# Patient Record
Sex: Male | Born: 1935 | Race: White | Hispanic: No | Marital: Married
Health system: Midwestern US, Community
[De-identification: ages and names within clinical notes are randomized; demographics above are authoritative.]

## PROBLEM LIST (undated history)

## (undated) DIAGNOSIS — A414 Sepsis due to anaerobes: Secondary | ICD-10-CM

## (undated) DIAGNOSIS — D539 Nutritional anemia, unspecified: Secondary | ICD-10-CM

## (undated) DIAGNOSIS — K625 Hemorrhage of anus and rectum: Secondary | ICD-10-CM

## (undated) DIAGNOSIS — Z8719 Personal history of other diseases of the digestive system: Secondary | ICD-10-CM

## (undated) DIAGNOSIS — R1310 Dysphagia, unspecified: Secondary | ICD-10-CM

## (undated) DIAGNOSIS — E785 Hyperlipidemia, unspecified: Secondary | ICD-10-CM

## (undated) DIAGNOSIS — R32 Unspecified urinary incontinence: Secondary | ICD-10-CM

## (undated) DIAGNOSIS — R160 Hepatomegaly, not elsewhere classified: Secondary | ICD-10-CM

## (undated) DIAGNOSIS — K7581 Nonalcoholic steatohepatitis (NASH): Secondary | ICD-10-CM

## (undated) DIAGNOSIS — N401 Enlarged prostate with lower urinary tract symptoms: Secondary | ICD-10-CM

## (undated) DIAGNOSIS — C221 Intrahepatic bile duct carcinoma: Secondary | ICD-10-CM

## (undated) DIAGNOSIS — Z86010 Personal history of colonic polyps: Secondary | ICD-10-CM

## (undated) DIAGNOSIS — I2510 Atherosclerotic heart disease of native coronary artery without angina pectoris: Secondary | ICD-10-CM

## (undated) DIAGNOSIS — N5201 Erectile dysfunction due to arterial insufficiency: Secondary | ICD-10-CM

## (undated) DIAGNOSIS — F0390 Unspecified dementia without behavioral disturbance: Secondary | ICD-10-CM

## (undated) DIAGNOSIS — I10 Essential (primary) hypertension: Secondary | ICD-10-CM

## (undated) DIAGNOSIS — R5383 Other fatigue: Secondary | ICD-10-CM

## (undated) DIAGNOSIS — M19011 Primary osteoarthritis, right shoulder: Secondary | ICD-10-CM

## (undated) DIAGNOSIS — E291 Testicular hypofunction: Secondary | ICD-10-CM

## (undated) DIAGNOSIS — Z951 Presence of aortocoronary bypass graft: Secondary | ICD-10-CM

## (undated) DIAGNOSIS — K5730 Diverticulosis of large intestine without perforation or abscess without bleeding: Secondary | ICD-10-CM

## (undated) DIAGNOSIS — H02423 Myogenic ptosis of bilateral eyelids: Secondary | ICD-10-CM

## (undated) DIAGNOSIS — I77811 Abdominal aortic ectasia: Secondary | ICD-10-CM

## (undated) DIAGNOSIS — K639 Disease of intestine, unspecified: Secondary | ICD-10-CM

## (undated) DIAGNOSIS — N138 Other obstructive and reflux uropathy: Secondary | ICD-10-CM

## (undated) DIAGNOSIS — Z87891 Personal history of nicotine dependence: Secondary | ICD-10-CM

## (undated) DIAGNOSIS — K760 Fatty (change of) liver, not elsewhere classified: Secondary | ICD-10-CM

## (undated) DIAGNOSIS — L719 Rosacea, unspecified: Secondary | ICD-10-CM

## (undated) DIAGNOSIS — Z8711 Personal history of peptic ulcer disease: Secondary | ICD-10-CM

## (undated) DIAGNOSIS — E119 Type 2 diabetes mellitus without complications: Secondary | ICD-10-CM

## (undated) DIAGNOSIS — N400 Enlarged prostate without lower urinary tract symptoms: Secondary | ICD-10-CM

## (undated) DIAGNOSIS — R197 Diarrhea, unspecified: Secondary | ICD-10-CM

## (undated) HISTORY — PX: VASECTOMY: SHX75

## (undated) HISTORY — DX: Presence of aortocoronary bypass graft: Z95.1

## (undated) HISTORY — DX: Unspecified dementia, unspecified severity, without behavioral disturbance, psychotic disturbance, mood disturbance, and anxiety: F03.90

## (undated) HISTORY — DX: Hemorrhage of anus and rectum: K62.5

## (undated) HISTORY — DX: Nonalcoholic steatohepatitis (NASH): K75.81

## (undated) HISTORY — DX: Unspecified urinary incontinence: R32

## (undated) HISTORY — DX: Primary osteoarthritis, right shoulder: M19.011

## (undated) HISTORY — PX: COLONOSCOPY: SHX174

## (undated) HISTORY — DX: Essential (primary) hypertension: I10

## (undated) HISTORY — DX: Dysphagia, unspecified: R13.10

## (undated) HISTORY — DX: Fatty (change of) liver, not elsewhere classified: K76.0

## (undated) HISTORY — DX: Other fatigue: R53.83

## (undated) HISTORY — PX: CATARACT EXTRACTION, BILATERAL: SHX1313

## (undated) HISTORY — DX: Hyperlipidemia, unspecified: E78.5

## (undated) HISTORY — DX: Other obstructive and reflux uropathy: N13.8

## (undated) HISTORY — DX: Diverticulosis of large intestine without perforation or abscess without bleeding: K57.30

## (undated) HISTORY — DX: Type 2 diabetes mellitus without complications: E11.9

## (undated) HISTORY — DX: Personal history of other diseases of the digestive system: Z87.19

## (undated) HISTORY — DX: Testicular hypofunction: E29.1

## (undated) HISTORY — DX: Nutritional anemia, unspecified: D53.9

## (undated) HISTORY — DX: Disease of intestine, unspecified: K63.9

## (undated) HISTORY — PX: OTHER SURGICAL HISTORY: SHX169

## (undated) HISTORY — PX: PTCA: SHX146

## (undated) HISTORY — DX: Personal history of nicotine dependence: Z87.891

## (undated) HISTORY — DX: Atherosclerotic heart disease of native coronary artery without angina pectoris: I25.10

## (undated) HISTORY — DX: Sepsis due to anaerobes: A41.4

## (undated) HISTORY — DX: Myogenic ptosis of bilateral eyelids: H02.423

## (undated) HISTORY — PX: TONSILLECTOMY: SUR1361

## (undated) HISTORY — PX: UMBILICAL HERNIA REPAIR: SHX196

## (undated) HISTORY — DX: Personal history of colonic polyps: Z86.010

## (undated) HISTORY — DX: Rosacea, unspecified: L71.9

## (undated) HISTORY — DX: Abdominal aortic ectasia: I77.811

## (undated) HISTORY — DX: Benign prostatic hyperplasia with lower urinary tract symptoms: N40.1

## (undated) HISTORY — DX: Personal history of peptic ulcer disease: Z87.11

## (undated) HISTORY — DX: Hepatomegaly, not elsewhere classified: R16.0

## (undated) HISTORY — DX: Erectile dysfunction due to arterial insufficiency: N52.01

---
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
## 1998-12-21 ENCOUNTER — Other Ambulatory Visit: Admission: RE | Admit: 1998-12-21 | Discharge: 1998-12-21 | Payer: Self-pay | Admitting: Internal Medicine

## 2000-11-19 DIAGNOSIS — Z86010 Personal history of colonic polyps: Secondary | ICD-10-CM

## 2000-11-19 DIAGNOSIS — Z860101 Personal history of adenomatous and serrated colon polyps: Secondary | ICD-10-CM

## 2000-11-19 HISTORY — DX: Personal history of adenomatous and serrated colon polyps: Z86.0101

## 2000-11-19 HISTORY — DX: Personal history of colonic polyps: Z86.010

## 2001-11-04 ENCOUNTER — Encounter (INDEPENDENT_AMBULATORY_CARE_PROVIDER_SITE_OTHER): Payer: Self-pay | Admitting: Gastroenterology

## 2004-11-02 ENCOUNTER — Ambulatory Visit: Payer: Self-pay | Admitting: Internal Medicine

## 2004-11-17 ENCOUNTER — Ambulatory Visit: Payer: Self-pay

## 2005-01-03 ENCOUNTER — Ambulatory Visit: Payer: Self-pay | Admitting: Internal Medicine

## 2005-01-04 ENCOUNTER — Ambulatory Visit: Payer: Self-pay | Admitting: Gastroenterology

## 2005-01-16 ENCOUNTER — Ambulatory Visit: Payer: Self-pay | Admitting: Gastroenterology

## 2005-01-25 ENCOUNTER — Ambulatory Visit: Payer: Self-pay | Admitting: Internal Medicine

## 2005-04-17 ENCOUNTER — Ambulatory Visit: Payer: Self-pay | Admitting: Internal Medicine

## 2005-04-19 ENCOUNTER — Ambulatory Visit: Payer: Self-pay | Admitting: Internal Medicine

## 2005-04-26 ENCOUNTER — Ambulatory Visit: Payer: Self-pay | Admitting: Internal Medicine

## 2005-05-18 ENCOUNTER — Ambulatory Visit: Payer: Self-pay | Admitting: Internal Medicine

## 2005-06-18 ENCOUNTER — Ambulatory Visit: Payer: Self-pay | Admitting: Internal Medicine

## 2005-06-26 ENCOUNTER — Ambulatory Visit: Payer: Self-pay | Admitting: Internal Medicine

## 2005-07-16 ENCOUNTER — Ambulatory Visit: Payer: Self-pay | Admitting: Internal Medicine

## 2005-08-16 ENCOUNTER — Ambulatory Visit: Payer: Self-pay | Admitting: Internal Medicine

## 2005-09-04 ENCOUNTER — Ambulatory Visit: Payer: Self-pay | Admitting: Internal Medicine

## 2005-09-17 ENCOUNTER — Ambulatory Visit: Payer: Self-pay | Admitting: Internal Medicine

## 2005-10-18 ENCOUNTER — Ambulatory Visit: Payer: Self-pay | Admitting: Endocrinology

## 2005-10-23 ENCOUNTER — Ambulatory Visit: Payer: Self-pay | Admitting: Internal Medicine

## 2005-11-22 ENCOUNTER — Ambulatory Visit: Payer: Self-pay | Admitting: Internal Medicine

## 2005-12-15 ENCOUNTER — Ambulatory Visit: Payer: Self-pay | Admitting: Internal Medicine

## 2005-12-24 ENCOUNTER — Ambulatory Visit: Payer: Self-pay | Admitting: Internal Medicine

## 2006-01-21 ENCOUNTER — Ambulatory Visit: Payer: Self-pay | Admitting: Internal Medicine

## 2006-02-21 ENCOUNTER — Ambulatory Visit: Payer: Self-pay | Admitting: Internal Medicine

## 2006-03-21 ENCOUNTER — Ambulatory Visit: Payer: Self-pay | Admitting: Internal Medicine

## 2006-04-08 ENCOUNTER — Ambulatory Visit: Payer: Self-pay | Admitting: Internal Medicine

## 2006-04-23 ENCOUNTER — Ambulatory Visit: Payer: Self-pay | Admitting: Internal Medicine

## 2006-05-06 ENCOUNTER — Ambulatory Visit: Payer: Self-pay | Admitting: Internal Medicine

## 2006-05-21 ENCOUNTER — Ambulatory Visit: Payer: Self-pay | Admitting: Internal Medicine

## 2006-06-03 ENCOUNTER — Ambulatory Visit: Payer: Self-pay | Admitting: Internal Medicine

## 2006-06-25 ENCOUNTER — Ambulatory Visit: Payer: Self-pay | Admitting: Internal Medicine

## 2006-07-23 ENCOUNTER — Ambulatory Visit: Payer: Self-pay | Admitting: Internal Medicine

## 2006-08-27 ENCOUNTER — Ambulatory Visit: Payer: Self-pay | Admitting: Internal Medicine

## 2006-08-28 ENCOUNTER — Ambulatory Visit: Payer: Self-pay | Admitting: Internal Medicine

## 2006-09-27 ENCOUNTER — Ambulatory Visit: Payer: Self-pay | Admitting: Internal Medicine

## 2006-10-29 ENCOUNTER — Ambulatory Visit: Payer: Self-pay | Admitting: Internal Medicine

## 2006-11-26 ENCOUNTER — Ambulatory Visit: Payer: Self-pay | Admitting: Internal Medicine

## 2006-12-24 ENCOUNTER — Ambulatory Visit: Payer: Self-pay | Admitting: Internal Medicine

## 2007-01-21 ENCOUNTER — Ambulatory Visit: Payer: Self-pay | Admitting: Internal Medicine

## 2007-02-11 ENCOUNTER — Ambulatory Visit: Payer: Self-pay | Admitting: Internal Medicine

## 2007-02-26 ENCOUNTER — Ambulatory Visit (HOSPITAL_COMMUNITY): Admission: RE | Admit: 2007-02-26 | Discharge: 2007-02-26 | Payer: Self-pay | Admitting: Internal Medicine

## 2007-02-26 ENCOUNTER — Ambulatory Visit: Payer: Self-pay | Admitting: Internal Medicine

## 2007-03-05 ENCOUNTER — Ambulatory Visit: Payer: Self-pay | Admitting: Internal Medicine

## 2007-03-18 ENCOUNTER — Ambulatory Visit: Payer: Self-pay | Admitting: Internal Medicine

## 2007-04-16 ENCOUNTER — Encounter: Payer: Self-pay | Admitting: Endocrinology

## 2007-04-16 DIAGNOSIS — M109 Gout, unspecified: Secondary | ICD-10-CM

## 2007-04-16 DIAGNOSIS — E291 Testicular hypofunction: Secondary | ICD-10-CM

## 2007-04-16 DIAGNOSIS — I10 Essential (primary) hypertension: Secondary | ICD-10-CM | POA: Insufficient documentation

## 2007-04-17 ENCOUNTER — Ambulatory Visit: Payer: Self-pay | Admitting: Internal Medicine

## 2007-05-20 ENCOUNTER — Ambulatory Visit: Payer: Self-pay | Admitting: Internal Medicine

## 2007-06-10 ENCOUNTER — Ambulatory Visit: Payer: Self-pay | Admitting: Internal Medicine

## 2007-06-10 LAB — CONVERTED CEMR LAB
TSH: 2.59 microintl units/mL (ref 0.35–5.50)
Testosterone: 303.65 ng/dL — ABNORMAL LOW (ref 350.00–890)
Total CHOL/HDL Ratio: 3.2
VLDL: 28 mg/dL (ref 0–40)

## 2007-06-17 ENCOUNTER — Ambulatory Visit: Payer: Self-pay | Admitting: Internal Medicine

## 2007-07-15 ENCOUNTER — Ambulatory Visit: Payer: Self-pay | Admitting: Internal Medicine

## 2007-08-12 ENCOUNTER — Ambulatory Visit: Payer: Self-pay | Admitting: Internal Medicine

## 2007-08-27 ENCOUNTER — Ambulatory Visit: Payer: Self-pay | Admitting: Internal Medicine

## 2007-09-11 ENCOUNTER — Ambulatory Visit: Payer: Self-pay | Admitting: Internal Medicine

## 2007-09-19 ENCOUNTER — Encounter: Payer: Self-pay | Admitting: Internal Medicine

## 2007-10-21 ENCOUNTER — Ambulatory Visit: Payer: Self-pay | Admitting: Internal Medicine

## 2007-10-28 ENCOUNTER — Ambulatory Visit (HOSPITAL_COMMUNITY): Admission: RE | Admit: 2007-10-28 | Discharge: 2007-10-28 | Payer: Self-pay | Admitting: General Surgery

## 2007-11-25 ENCOUNTER — Ambulatory Visit: Payer: Self-pay | Admitting: Internal Medicine

## 2007-12-25 ENCOUNTER — Encounter: Payer: Self-pay | Admitting: Internal Medicine

## 2007-12-30 ENCOUNTER — Ambulatory Visit: Payer: Self-pay | Admitting: Internal Medicine

## 2008-01-13 ENCOUNTER — Encounter (INDEPENDENT_AMBULATORY_CARE_PROVIDER_SITE_OTHER): Payer: Self-pay | Admitting: *Deleted

## 2008-01-13 ENCOUNTER — Ambulatory Visit: Payer: Self-pay | Admitting: Internal Medicine

## 2008-01-13 LAB — CONVERTED CEMR LAB
CO2: 29 meq/L (ref 19–32)
Chloride: 104 meq/L (ref 96–112)
Creatinine, Ser: 1.1 mg/dL (ref 0.4–1.5)
GFR calc Af Amer: 85 mL/min
Hgb A1c MFr Bld: 6.5 % — ABNORMAL HIGH (ref 4.6–6.0)
Potassium: 4.8 meq/L (ref 3.5–5.1)
Sodium: 140 meq/L (ref 135–145)

## 2008-01-15 ENCOUNTER — Encounter: Payer: Self-pay | Admitting: Internal Medicine

## 2008-01-27 ENCOUNTER — Ambulatory Visit: Payer: Self-pay | Admitting: Internal Medicine

## 2008-02-24 ENCOUNTER — Ambulatory Visit: Payer: Self-pay | Admitting: Internal Medicine

## 2008-03-25 ENCOUNTER — Ambulatory Visit: Payer: Self-pay | Admitting: Internal Medicine

## 2008-04-05 ENCOUNTER — Encounter: Payer: Self-pay | Admitting: Internal Medicine

## 2008-04-07 ENCOUNTER — Encounter: Payer: Self-pay | Admitting: Internal Medicine

## 2008-04-21 ENCOUNTER — Telehealth: Payer: Self-pay | Admitting: Internal Medicine

## 2008-04-22 ENCOUNTER — Ambulatory Visit: Payer: Self-pay | Admitting: Internal Medicine

## 2008-05-13 ENCOUNTER — Encounter: Payer: Self-pay | Admitting: Internal Medicine

## 2008-05-14 ENCOUNTER — Ambulatory Visit: Payer: Self-pay | Admitting: Internal Medicine

## 2008-05-14 ENCOUNTER — Encounter (INDEPENDENT_AMBULATORY_CARE_PROVIDER_SITE_OTHER): Payer: Self-pay | Admitting: *Deleted

## 2008-05-25 ENCOUNTER — Telehealth: Payer: Self-pay | Admitting: Internal Medicine

## 2008-05-27 ENCOUNTER — Ambulatory Visit: Payer: Self-pay | Admitting: Internal Medicine

## 2008-06-14 ENCOUNTER — Encounter: Payer: Self-pay | Admitting: Internal Medicine

## 2008-06-23 ENCOUNTER — Ambulatory Visit: Payer: Self-pay | Admitting: Internal Medicine

## 2008-06-23 DIAGNOSIS — E7800 Pure hypercholesterolemia, unspecified: Secondary | ICD-10-CM | POA: Insufficient documentation

## 2008-06-23 DIAGNOSIS — E785 Hyperlipidemia, unspecified: Secondary | ICD-10-CM

## 2008-06-23 LAB — CONVERTED CEMR LAB
ALT: 27 units/L (ref 0–53)
Albumin: 4.3 g/dL (ref 3.5–5.2)
Calcium: 9.4 mg/dL (ref 8.4–10.5)
Chloride: 103 meq/L (ref 96–112)
Cholesterol: 143 mg/dL (ref 0–200)
Creatinine, Ser: 1.1 mg/dL (ref 0.4–1.5)
Direct LDL: 85.1 mg/dL
GFR calc non Af Amer: 70 mL/min
HDL: 33.1 mg/dL — ABNORMAL LOW (ref >39.0)
Potassium: 4.2 meq/L (ref 3.5–5.1)
Total CHOL/HDL Ratio: 4.3
Total Protein: 7.1 g/dL (ref 6.0–8.3)

## 2008-06-27 ENCOUNTER — Encounter: Payer: Self-pay | Admitting: Internal Medicine

## 2008-07-22 ENCOUNTER — Ambulatory Visit: Payer: Self-pay | Admitting: Internal Medicine

## 2008-08-03 ENCOUNTER — Encounter: Payer: Self-pay | Admitting: Internal Medicine

## 2008-08-17 ENCOUNTER — Ambulatory Visit: Payer: Self-pay | Admitting: Internal Medicine

## 2008-08-24 ENCOUNTER — Telehealth: Payer: Self-pay | Admitting: Internal Medicine

## 2008-08-24 DIAGNOSIS — E119 Type 2 diabetes mellitus without complications: Secondary | ICD-10-CM

## 2008-09-21 ENCOUNTER — Ambulatory Visit: Payer: Self-pay | Admitting: Internal Medicine

## 2008-10-18 ENCOUNTER — Telehealth: Payer: Self-pay | Admitting: Internal Medicine

## 2008-10-19 ENCOUNTER — Ambulatory Visit: Payer: Self-pay | Admitting: Internal Medicine

## 2008-11-23 ENCOUNTER — Ambulatory Visit: Payer: Self-pay | Admitting: Internal Medicine

## 2008-12-28 ENCOUNTER — Telehealth: Payer: Self-pay | Admitting: Internal Medicine

## 2008-12-28 ENCOUNTER — Ambulatory Visit: Payer: Self-pay | Admitting: Internal Medicine

## 2009-01-12 ENCOUNTER — Encounter: Payer: Self-pay | Admitting: Internal Medicine

## 2009-01-25 ENCOUNTER — Ambulatory Visit: Payer: Self-pay | Admitting: Internal Medicine

## 2009-02-01 ENCOUNTER — Ambulatory Visit: Payer: Self-pay | Admitting: Internal Medicine

## 2009-02-02 ENCOUNTER — Ambulatory Visit: Payer: Self-pay | Admitting: Internal Medicine

## 2009-02-02 LAB — CONVERTED CEMR LAB
Albumin: 4 g/dL (ref 3.5–5.2)
Bilirubin, Direct: 0.2 mg/dL (ref 0.0–0.3)
Chloride: 106 meq/L (ref 96–112)
Cholesterol: 103 mg/dL (ref 0–200)
Glucose, Bld: 121 mg/dL — ABNORMAL HIGH (ref 70–99)
HDL: 29.9 mg/dL — ABNORMAL LOW (ref >39.00)
Potassium: 4.8 meq/L (ref 3.5–5.1)
Sodium: 140 meq/L (ref 135–145)
Total Protein: 6.4 g/dL (ref 6.0–8.3)
Triglycerides: 87 mg/dL (ref 0.0–149.0)
VLDL: 17.4 mg/dL (ref 0.0–40.0)

## 2009-02-21 ENCOUNTER — Telehealth: Payer: Self-pay | Admitting: Gastroenterology

## 2009-02-22 ENCOUNTER — Ambulatory Visit: Payer: Self-pay | Admitting: Gastroenterology

## 2009-02-22 DIAGNOSIS — Z860100 Personal history of colon polyps, unspecified: Secondary | ICD-10-CM | POA: Insufficient documentation

## 2009-02-22 DIAGNOSIS — K5730 Diverticulosis of large intestine without perforation or abscess without bleeding: Secondary | ICD-10-CM

## 2009-02-22 DIAGNOSIS — K625 Hemorrhage of anus and rectum: Secondary | ICD-10-CM

## 2009-02-23 ENCOUNTER — Ambulatory Visit: Payer: Self-pay | Admitting: Internal Medicine

## 2009-02-24 ENCOUNTER — Telehealth: Payer: Self-pay | Admitting: Gastroenterology

## 2009-02-25 ENCOUNTER — Ambulatory Visit: Payer: Self-pay | Admitting: Gastroenterology

## 2009-03-04 ENCOUNTER — Telehealth: Payer: Self-pay | Admitting: Internal Medicine

## 2009-03-29 ENCOUNTER — Ambulatory Visit: Payer: Self-pay | Admitting: Internal Medicine

## 2009-04-26 ENCOUNTER — Ambulatory Visit: Payer: Self-pay | Admitting: Internal Medicine

## 2009-05-18 ENCOUNTER — Telehealth: Payer: Self-pay | Admitting: Internal Medicine

## 2009-05-18 ENCOUNTER — Telehealth (INDEPENDENT_AMBULATORY_CARE_PROVIDER_SITE_OTHER): Payer: Self-pay | Admitting: *Deleted

## 2009-06-01 ENCOUNTER — Ambulatory Visit: Payer: Self-pay | Admitting: Internal Medicine

## 2009-06-19 HISTORY — PX: CORONARY ARTERY BYPASS GRAFT: SHX141

## 2009-06-30 ENCOUNTER — Ambulatory Visit: Payer: Self-pay | Admitting: Internal Medicine

## 2009-07-12 ENCOUNTER — Inpatient Hospital Stay (HOSPITAL_COMMUNITY): Admission: AD | Admit: 2009-07-12 | Discharge: 2009-07-20 | Payer: Self-pay | Admitting: Internal Medicine

## 2009-07-12 ENCOUNTER — Telehealth: Payer: Self-pay | Admitting: Internal Medicine

## 2009-07-12 ENCOUNTER — Ambulatory Visit: Payer: Self-pay | Admitting: Internal Medicine

## 2009-07-12 ENCOUNTER — Ambulatory Visit: Payer: Self-pay | Admitting: *Deleted

## 2009-07-13 ENCOUNTER — Ambulatory Visit: Payer: Self-pay | Admitting: Thoracic Surgery (Cardiothoracic Vascular Surgery)

## 2009-07-13 ENCOUNTER — Encounter: Payer: Self-pay | Admitting: Cardiovascular Disease

## 2009-07-13 ENCOUNTER — Ambulatory Visit: Payer: Self-pay | Admitting: Internal Medicine

## 2009-07-18 ENCOUNTER — Telehealth: Payer: Self-pay | Admitting: Internal Medicine

## 2009-07-28 ENCOUNTER — Encounter: Payer: Self-pay | Admitting: Cardiovascular Disease

## 2009-08-07 DIAGNOSIS — Z951 Presence of aortocoronary bypass graft: Secondary | ICD-10-CM

## 2009-08-07 DIAGNOSIS — I2510 Atherosclerotic heart disease of native coronary artery without angina pectoris: Secondary | ICD-10-CM

## 2009-08-07 DIAGNOSIS — Z9861 Coronary angioplasty status: Secondary | ICD-10-CM

## 2009-08-07 DIAGNOSIS — Z87891 Personal history of nicotine dependence: Secondary | ICD-10-CM

## 2009-08-07 DIAGNOSIS — R5383 Other fatigue: Secondary | ICD-10-CM

## 2009-08-07 DIAGNOSIS — R5381 Other malaise: Secondary | ICD-10-CM

## 2009-08-08 ENCOUNTER — Ambulatory Visit: Payer: Self-pay | Admitting: Thoracic Surgery (Cardiothoracic Vascular Surgery)

## 2009-08-08 ENCOUNTER — Encounter
Admission: RE | Admit: 2009-08-08 | Discharge: 2009-08-08 | Payer: Self-pay | Admitting: Thoracic Surgery (Cardiothoracic Vascular Surgery)

## 2009-08-08 ENCOUNTER — Encounter: Payer: Self-pay | Admitting: Internal Medicine

## 2009-08-09 ENCOUNTER — Telehealth: Payer: Self-pay | Admitting: Internal Medicine

## 2009-08-09 ENCOUNTER — Ambulatory Visit: Payer: Self-pay | Admitting: Cardiovascular Disease

## 2009-08-09 DIAGNOSIS — E782 Mixed hyperlipidemia: Secondary | ICD-10-CM

## 2009-08-11 ENCOUNTER — Ambulatory Visit: Payer: Self-pay | Admitting: Internal Medicine

## 2009-08-11 ENCOUNTER — Encounter (HOSPITAL_COMMUNITY): Admission: RE | Admit: 2009-08-11 | Discharge: 2009-11-09 | Payer: Self-pay | Admitting: Cardiovascular Disease

## 2009-08-12 ENCOUNTER — Encounter: Payer: Self-pay | Admitting: Cardiovascular Disease

## 2009-08-22 ENCOUNTER — Telehealth: Payer: Self-pay | Admitting: Cardiovascular Disease

## 2009-08-22 ENCOUNTER — Encounter (INDEPENDENT_AMBULATORY_CARE_PROVIDER_SITE_OTHER): Payer: Self-pay | Admitting: *Deleted

## 2009-08-22 ENCOUNTER — Telehealth (INDEPENDENT_AMBULATORY_CARE_PROVIDER_SITE_OTHER): Payer: Self-pay | Admitting: *Deleted

## 2009-08-24 ENCOUNTER — Encounter: Payer: Self-pay | Admitting: Cardiovascular Disease

## 2009-09-01 ENCOUNTER — Encounter: Payer: Self-pay | Admitting: Cardiovascular Disease

## 2009-10-04 ENCOUNTER — Ambulatory Visit: Payer: Self-pay | Admitting: Cardiovascular Disease

## 2009-10-24 ENCOUNTER — Encounter: Payer: Self-pay | Admitting: Internal Medicine

## 2009-11-04 ENCOUNTER — Encounter: Payer: Self-pay | Admitting: Cardiovascular Disease

## 2009-11-10 ENCOUNTER — Encounter (HOSPITAL_COMMUNITY): Admission: RE | Admit: 2009-11-10 | Discharge: 2009-11-18 | Payer: Self-pay | Admitting: Cardiovascular Disease

## 2009-11-28 ENCOUNTER — Ambulatory Visit: Payer: Self-pay | Admitting: Internal Medicine

## 2009-12-02 ENCOUNTER — Ambulatory Visit: Payer: Self-pay | Admitting: Internal Medicine

## 2009-12-22 ENCOUNTER — Encounter: Payer: Self-pay | Admitting: Cardiovascular Disease

## 2010-01-05 ENCOUNTER — Ambulatory Visit: Payer: Self-pay | Admitting: Internal Medicine

## 2010-01-05 LAB — CONVERTED CEMR LAB
ALT: 24 units/L (ref 0–53)
AST: 19 units/L (ref 0–37)
Albumin: 4 g/dL (ref 3.5–5.2)
Alkaline Phosphatase: 79 units/L (ref 39–117)
BUN: 19 mg/dL (ref 6–23)
Bilirubin, Direct: 0.1 mg/dL (ref 0.0–0.3)
CO2: 32 meq/L (ref 19–32)
Calcium: 9.4 mg/dL (ref 8.4–10.5)
Chloride: 107 meq/L (ref 96–112)
Cholesterol: 99 mg/dL (ref 0–200)
Creatinine, Ser: 1 mg/dL (ref 0.4–1.5)
GFR calc non Af Amer: 77.84 mL/min (ref >60)
Glucose, Bld: 81 mg/dL (ref 70–99)
HDL: 47.3 mg/dL (ref >39.00)
Hgb A1c MFr Bld: 6.7 % — ABNORMAL HIGH (ref 4.6–6.5)
LDL Cholesterol: 25 mg/dL (ref 0–99)
Potassium: 4.8 meq/L (ref 3.5–5.1)
Sodium: 143 meq/L (ref 135–145)
Total Bilirubin: 0.6 mg/dL (ref 0.3–1.2)
Total CHOL/HDL Ratio: 2
Total Protein: 7 g/dL (ref 6.0–8.3)
Triglycerides: 132 mg/dL (ref 0.0–149.0)
Uric Acid, Serum: 7.1 mg/dL (ref 4.0–7.8)
VLDL: 26.4 mg/dL (ref 0.0–40.0)

## 2010-01-06 DIAGNOSIS — Z8669 Personal history of other diseases of the nervous system and sense organs: Secondary | ICD-10-CM | POA: Insufficient documentation

## 2010-01-06 DIAGNOSIS — N400 Enlarged prostate without lower urinary tract symptoms: Secondary | ICD-10-CM

## 2010-03-23 ENCOUNTER — Telehealth: Payer: Self-pay | Admitting: Internal Medicine

## 2010-03-29 ENCOUNTER — Ambulatory Visit: Payer: Self-pay | Admitting: Cardiovascular Disease

## 2010-04-03 ENCOUNTER — Encounter: Payer: Self-pay | Admitting: Internal Medicine

## 2010-04-04 ENCOUNTER — Telehealth: Payer: Self-pay | Admitting: Internal Medicine

## 2010-07-26 ENCOUNTER — Ambulatory Visit: Payer: Self-pay | Admitting: Internal Medicine

## 2010-07-26 LAB — CONVERTED CEMR LAB
Chloride: 104 meq/L (ref 96–112)
Hgb A1c MFr Bld: 6.7 % — ABNORMAL HIGH (ref 4.6–6.5)

## 2010-07-27 ENCOUNTER — Encounter: Payer: Self-pay | Admitting: Internal Medicine

## 2010-08-09 ENCOUNTER — Telehealth: Payer: Self-pay | Admitting: Internal Medicine

## 2010-09-26 ENCOUNTER — Ambulatory Visit: Payer: Self-pay | Admitting: Internal Medicine

## 2010-09-26 DIAGNOSIS — J00 Acute nasopharyngitis [common cold]: Secondary | ICD-10-CM | POA: Insufficient documentation

## 2010-12-19 NOTE — Letter (Signed)
   [facility] [place], [place]  [address] Phone: [phone]      July 28, 2010   Ricardo Washington [address] [place], [place] [address]  RE:  LAB RESULTS  Dear  [name],  The following is an interpretation of your most recent lab tests.  Please take note of any instructions provided or changes to medications that have resulted from your lab work.  ELECTROLYTES:  Good - no changes needed  KIDNEY FUNCTION TESTS:  Good - no changes needed    DIABETIC STUDIES:  Good - no changes needed Blood Glucose: 77   HgbA1C: 6.7     Good diabetes control. Keep up the good work.   Sincerely Yours,    Neena Rhymes MD  : Ricardo Washington Note: All result statuses are Final unless otherwise noted.  Tests: (1) Hemoglobin A1C (A1C)   Hemoglobin A1C       [H]  6.7 %                       4.6-6.5     Glycemic Control Guidelines for People with Diabetes:     Non Diabetic:  <6%     Goal of Therapy: <7%     Additional Action Suggested:  >8%   Tests: (2) BMP (METABOL)   Sodium                    141 mEq/L                   135-145   Potassium            [H]  5.2 mEq/L                   3.5-5.1   Chloride                  104 mEq/L                   96-112   Carbon Dioxide            30 mEq/L                    19-32   Glucose                   77 mg/dL                    70-99   BUN                       19 mg/dL                    6-23   Creatinine                0.9 mg/dL                   0.4-1.5   Calcium                   9.4 mg/dL                   8.4-10.5

## 2010-12-19 NOTE — Medication Information (Signed)
Summary: Request for Med Documents/[facility]  Request for Med Documents/[facility]   Imported By: Phillis Knack 04/06/2010 09:17:52  _____________________________________________________________________  External Attachment:    Type:   Image     Comment:   External Document

## 2010-12-19 NOTE — Miscellaneous (Signed)
Summary: [facility]  [facility]   Imported By: Sallee Provencal 01/03/2010 12:37:34  _____________________________________________________________________  External Attachment:    Type:   Image     Comment:   External Document

## 2010-12-19 NOTE — Assessment & Plan Note (Signed)
Summary: cough,cold/cd   Vital Signs:  Patient profile:   76 year old male Height:      66 inches Weight:      204 pounds Temp:     97.6 degrees F oral Pulse rate:   64 / minute Pulse rhythm:   regular Resp:     16 per minute BP sitting:   110 / 60  (left arm) Cuff size:   regular  Vitals Entered By: Jonathon Resides, CMA(AAMA) (September 26, 2010 10:40 AM) CC: stuffy nose, sore throat and dry cough X 4 days Is Patient Diabetic? Yes   Primary Care Provider:  Evert Kohl  CC:  stuffy nose and sore throat and dry cough X 4 days.  History of Present Illness: Mr. Bedrosian presents today with a 3-5 days history of nasal congestion, sore throat, and hacking cough.  He noticed this all came on about 3-5 days ago and has progressivly become worse since than.  He does have some mucous production which is white in color.  His cough is more of a dry cough with just occasional white/clear sputum production.  He also complains of sinus pressure in his frontal and maxillary sinuses but no headache anywhere else.  He denies any fullness of the ears, fevers, or night sweats.  He does complain that he has been cold, but is always cold, has been more tired lately, and has felt weaker than normal.  He has been taking Robitussin and pseudoephedrine but they have not really been helpful.  He did have a relative that visited him recently who was sick with something.  He is up-to-date with both his flu shot and his pneumonia vaccination.   Current Medications (verified): 1)  Crestor 10 Mg Tabs (Rosuvastatin Calcium) .... Take One Tablet By Mouth Daily. 2)  Lisinopril 5 Mg Tabs (Lisinopril) .... Take One Tablet By Mouth Daily 3)  Metformin Hcl 500 Mg Tb24 (Metformin Hcl) .... Take 1 Tablet By Mouth Two Times A Day 4)  Onetouch Ultrasoft Lancets  Misc (Lancets) .... Test Once Daily As Needed Dx:250.00 5)  Flomax 0.4 Mg  Cp24 (Tamsulosin Hcl) .... Take 1 Tablet By Mouth Once A Day 6)  Multivitamins   Tabs  (Multiple Vitamin) .... Take 1 Tablet By Mouth Once A Day 7)  Aspirin Ec 325 Mg Tbec (Aspirin) .... Take One Tablet By Mouth Daily 8)  Metamucil Smooth Texture 28.3 %  Powd (Psyllium) .... 2 Tablespoons Two Times A Day 9)  Alprazolam 0.25 Mg  Tabs (Alprazolam) .... As Directed As Needed 10)  L-Lysine Hcl 500 Mg  Tabs (Lysine Hcl) .... Take 2 Tablet By Mouth Once A Day 11)  Trueresult Blood Glucose W/device Kit (Blood Glucose Monitoring Suppl) .... Check Blood Sugar Every Day Dx 250.00 12)  Metoprolol Tartrate 25 Mg Tabs (Metoprolol Tartrate) .... Take One Tablet By Mouth Twice A Day 13)  Hydrocodone-Acetaminophen 5-325 Mg Tabs (Hydrocodone-Acetaminophen) .... As Needed 14)  Finasteride 5 Mg Tabs (Finasteride) .... Take 1 By Mouth Once Daily 15)  Indomethacin 25 Mg Caps (Indomethacin) .Marland Kitchen.. 1 By Mouth Three Times A Day As Needed Gout Flare 16)  Oxycodone Hcl 5 Mg Tabs (Oxycodone Hcl) .... As Needed 17)  Test Strips For Glucometer .... Dx250.00 Test Two Times A Day  Allergies (verified): 1)  ! Sulfa  Past History:  Past Medical History: Last updated: 01/05/2010 CATARACT, HX OF (ICD-V12.40) HYPERTROPHY PROSTATE W/O UR OBST & OTH LUTS (ICD-600.00) MIXED HYPERLIPIDEMIA (ICD-272.2) CORONARY ARTERY BYPASS GRAFT, THREE  VESSEL, HX OF (ICD-V45.81) PERCUTANEOUS TRANSLUMINAL CORONARY ANGIOPLASTY, HX OF (ICD-V45.82) * POSTOPERATIVE VOLUME OVERLOAD * POSTOPERATIVE ANEMIA CORONARY ARTERY DISEASE (ICD-414.00) FATIGUE (ICD-780.79) TOBACCO USE, QUIT (ICD-V15.82) DIVERTICULOSIS OF COLON (ICD-562.10)-adenomatous '02 PERSONAL HISTORY OF COLONIC POLYPS (ICD-V12.72) RECTAL BLEEDING (ICD-569.3) OTHER AND UNSPECIFIED HYPERLIPIDEMIA (ICD-272.4) HYPOGONADISM (ICD-257.2) HYPERTENSION (ICD-401.9) GOUT (ICD-274.9) AODM (ICD-250.00)  Past Surgical History: Last updated: 08/07/2009 Tonsillectomy Vasectomy Umbilical Hernia Repain 5053 Bilateral cataract extraction and lens implants Current Problems:    PERCUTANEOUS TRANSLUMINAL CORONARY ANGIOPLASTY, HX OF (ICD-V45.82) 07-13-2009 CORONARY ARTERY BYPASS GRAFT, THREE VESSEL, HX OF (ICD-V45.81) 07-14-2009   Hendrickson Median sternotomy extracorporeal circulation coronary   artery bypass grafting x4 (left internal mammary artery to left anterior   descending, saphenous vein graft to first obtuse marginal, sequential   saphenous vein graft to posterior descending and posterior lateral),   endoscopic vein harvest right thigh  Family History: Last updated: 13-Jun-2008 father-deceased @59 : cancer -gastric mother- deceased @62 : cancer death Neg- colon or prostate cancer; CAD, CVA, DM  Social History: Last updated: 02/22/2009 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  Alcohol Use - no Illicit Drug Use - no  Risk Factors: Caffeine Use: 1 (2008/06/13) Exercise: yes (2008/06/13)  Risk Factors: Smoking Status: quit (02/22/2009) Passive Smoke Exposure: no (06-13-2008)  Review of Systems       The patient complains of anorexia, decreased hearing, prolonged cough, and muscle weakness.  The patient denies fever, weight loss, weight gain, vision loss, hoarseness, chest pain, syncope, dyspnea on exertion, headaches, hemoptysis, abdominal pain, melena, hematochezia, hematuria, incontinence, suspicious skin lesions, difficulty walking, depression, unusual weight change, abnormal bleeding, and enlarged lymph nodes.    Physical Exam  General:  alert, well-developed, and well-nourished.   Head:  normocephalic and atraumatic.   Eyes:  pupils equal, pupils round, and pupils reactive to light.   Ears:  R ear normal, no external deformities, and L Cerumen impaction.   Nose:  no external deformity, no external erythema, no nasal discharge, no mucosal pallor, no mucosal edema, and mucosal erythema.    Tenderness to palpation in the frontal and maxillary sinus region. Mouth:  good dentition, pharynx pink and moist, no exudates, and pharyngeal erythema.   Neck:  supple, full ROM, no masses, no cervical lymphadenopathy, and no neck tenderness.   Lungs:  normal respiratory effort, normal breath sounds, no dullness, no crackles, and no wheezes.   Heart:  normal rate, regular rhythm, no murmur, no gallop, no rub, and physiological split S2.   Abdomen:  soft, non-tender, normal bowel sounds, and no distention.   Pulses:  R radial normal, R posterior tibial normal, L radial normal, and L posterior tibial normal.   Neurologic:  alert & oriented X3, cranial nerves II-XII intact, and gait normal.   Skin:  turgor normal and color normal.   Cervical Nodes:  no anterior cervical adenopathy and no posterior cervical adenopathy.   Psych:  Oriented X3, memory intact for recent and remote, normally interactive, good eye contact, not anxious appearing, and not depressed appearing.     Impression & Recommendations:  Problem # 1:  ACUTE NASOPHARYNGITIS (ICD-460) Mr. Brallier does not have any signs of bacterial infection (afebrile, little to no sputum, normal lung sounds) and does not currently need an antibiotic.  This is most likely due to either a cold or allergies due to the current time of  the year and changes in climate.    Plan:  D/c pseudoephedrine use           Start nasal spray for sinus congestion Raul Del)           Stove top vaporizor treatment for sinus congestion           Increase fluid intake           Call if having feavers or increased green/yellow sputum production  Problem # 2:  COUGH (ICD-786.2) Mr. Loiseau cough is probably due to the postnasal drip from his cold which is in turn causing him to have a sore throat.  The plan for his first problem should also help decrease his cough as well.    Plan:  Continue using Robitussin DM for cough and to help with  decongestion.  Complete Medication List: 1)  Crestor 10 Mg Tabs (Rosuvastatin calcium) .... Take one tablet by mouth daily. 2)  Lisinopril 5 Mg Tabs (Lisinopril) .... Take one tablet by mouth daily 3)  Metformin Hcl 500 Mg Tb24 (Metformin hcl) .... Take 1 tablet by mouth two times a day 4)  Onetouch Ultrasoft Lancets Misc (Lancets) .... Test once daily as needed dx:250.00 5)  Flomax 0.4 Mg Cp24 (Tamsulosin hcl) .... Take 1 tablet by mouth once a day 6)  Multivitamins Tabs (Multiple vitamin) .... Take 1 tablet by mouth once a day 7)  Aspirin Ec 325 Mg Tbec (Aspirin) .... Take one tablet by mouth daily 8)  Metamucil Smooth Texture 28.3 % Powd (Psyllium) .... 2 tablespoons two times a day 9)  Alprazolam 0.25 Mg Tabs (Alprazolam) .... As directed as needed 10)  L-lysine Hcl 500 Mg Tabs (Lysine hcl) .... Take 2 tablet by mouth once a day 11)  Trueresult Blood Glucose W/device Kit (Blood glucose monitoring suppl) .... Check blood sugar every day dx 250.00 12)  Metoprolol Tartrate 25 Mg Tabs (Metoprolol tartrate) .... Take one tablet by mouth twice a day 13)  Hydrocodone-acetaminophen 5-325 Mg Tabs (Hydrocodone-acetaminophen) .... As needed 14)  Finasteride 5 Mg Tabs (Finasteride) .... Take 1 by mouth once daily 15)  Indomethacin 25 Mg Caps (Indomethacin) .Marland Kitchen.. 1 by mouth three times a day as needed gout flare 16)  Oxycodone Hcl 5 Mg Tabs (Oxycodone hcl) .... As needed 17)  Test Strips For Glucometer  .... Dx250.00 test two times a day   Orders Added: 1)  Est. Patient Level III [47425]

## 2010-12-19 NOTE — Letter (Signed)
Summary: [facility]   Imported By: Marilynne Drivers 12/07/2009 13:17:40  _____________________________________________________________________  External Attachment:    Type:   Image     Comment:   External Document

## 2010-12-19 NOTE — Progress Notes (Signed)
       New/Updated Medications: * TEST STRIPS FOR GLUCOMETER dx250.00 test two times a day Prescriptions: TEST STRIPS FOR GLUCOMETER dx250.00 test two times a day  #3 mth x 3   Entered by:   Charlsie Quest, CMA   Authorized by:   Neena Rhymes MD   Signed by:   Charlsie Quest, CMA on 04/04/2010   Method used:   [facility] ...       [facility]  [address] [phone]       [place], [place]  [address]       Ph: [phone] or [phone]       Fax: [phone]   RxID:   [id number]

## 2010-12-19 NOTE — Assessment & Plan Note (Signed)
Summary: FU---PER PT---STC   Vital Signs:  Patient profile:   76 year old male Height:      66 inches Weight:      202 pounds BMI:     32.72 O2 Sat:      95 % on Room air Temp:     96.8 degrees F oral Pulse rate:   66 / minute BP sitting:   106 / 64  (left arm) Cuff size:   regular  Vitals Entered By: Charlynne Cousins CMA (July 26, 2010 10:31 AM)  O2 Flow:  Room air CC: follow-up visit/ ab   Primary Care Provider:  Evert Kohl  CC:  follow-up visit/ ab.  History of Present Illness: Patient presents for follow-up of hypertension and known CAD. He was last seen in Feb - note reviewed. He saw Dr. Johnsie Cancel in May '11 and there report is good with no changes in his regimen. He reports taht his CBGs have been in controlled range. He does complain of decreased strength: he has been exercising, using dumbells. He does want to do more activity, he plan to resume doing his own yard work.   Current Medications (verified): 1)  Crestor 10 Mg Tabs (Rosuvastatin Calcium) .... Take One Tablet By Mouth Daily. 2)  Lisinopril 5 Mg Tabs (Lisinopril) .... Take One Tablet By Mouth Daily 3)  Metformin Hcl 500 Mg Tb24 (Metformin Hcl) .... Take 1 Tablet By Mouth Two Times A Day 4)  Onetouch Ultrasoft Lancets  Misc (Lancets) .... Test Once Daily As Needed Dx:250.00 5)  Flomax 0.4 Mg  Cp24 (Tamsulosin Hcl) .... Take 1 Tablet By Mouth Once A Day 6)  Multivitamins   Tabs (Multiple Vitamin) .... Take 1 Tablet By Mouth Once A Day 7)  Aspirin Ec 325 Mg Tbec (Aspirin) .... Take One Tablet By Mouth Daily 8)  Metamucil Smooth Texture 28.3 %  Powd (Psyllium) .... 2 Tablespoons Two Times A Day 9)  Alprazolam 0.25 Mg  Tabs (Alprazolam) .... As Directed As Needed 10)  L-Lysine Hcl 500 Mg  Tabs (Lysine Hcl) .... Take 2 Tablet By Mouth Once A Day 11)  Trueresult Blood Glucose W/device Kit (Blood Glucose Monitoring Suppl) .... Check Blood Sugar Every Day Dx 250.00 12)  Metoprolol Tartrate 25 Mg Tabs (Metoprolol  Tartrate) .... Take One Tablet By Mouth Twice A Day 13)  Hydrocodone-Acetaminophen 5-325 Mg Tabs (Hydrocodone-Acetaminophen) .... As Needed 14)  Finasteride 5 Mg Tabs (Finasteride) .... Take 1 By Mouth Once Daily 15)  Indomethacin 25 Mg Caps (Indomethacin) .Marland Kitchen.. 1 By Mouth Three Times A Day As Needed Gout Flare 16)  Oxycodone Hcl 5 Mg Tabs (Oxycodone Hcl) .... As Needed 17)  Test Strips For Glucometer .... Dx250.00 Test Two Times A Day  Allergies (verified): 1)  ! 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 '02 PERSONAL HISTORY OF COLONIC POLYPS (ICD-V12.72) RECTAL BLEEDING (ICD-569.3) OTHER AND UNSPECIFIED HYPERLIPIDEMIA (ICD-272.4) HYPOGONADISM (ICD-257.2) HYPERTENSION (ICD-401.9) GOUT (ICD-274.9) AODM (ICD-250.00)  Past Surgical History: Last updated: 08/07/2009 Tonsillectomy Vasectomy Umbilical Hernia Repain 4098 Bilateral cataract extraction and lens implants Current Problems:  PERCUTANEOUS TRANSLUMINAL CORONARY ANGIOPLASTY, HX OF (ICD-V45.82) 07-13-2009 CORONARY ARTERY BYPASS GRAFT, THREE VESSEL, HX  OF (ICD-V45.81) 07-14-2009   Hendrickson Median sternotomy extracorporeal circulation coronary   artery bypass grafting x4 (left internal mammary artery to left anterior   descending, saphenous vein graft to first obtuse marginal, sequential   saphenous vein graft to posterior descending and posterior lateral),   endoscopic vein harvest right thigh  Family History: Last updated: 06/10/2008 father-deceased @59 :  cancer -gastric mother- deceased @62 : cancer death Neg- colon or prostate cancer; CAD, CVA, DM  Social History: Last updated: 02/22/2009 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  Alcohol Use - no Illicit Drug Use - no  Risk Factors: Smoking Status: quit (02/22/2009) Passive Smoke Exposure: no (2008-06-10)  Review of Systems  The patient denies anorexia, fever, weight loss, weight gain, vision loss, hoarseness, chest pain, syncope, prolonged cough, headaches, abdominal pain, hematochezia, hematuria, muscle weakness, suspicious skin lesions, depression, enlarged lymph nodes, and angioedema.    Physical Exam  General:  Overweight white male who looks good and is in no distress Head:  Normocephalic and atraumatic without obvious abnormalities. No apparent alopecia or balding. Eyes:  pupils equal and pupils round.   Chest Wall:  well healed sternotomy scar with mild keloid formation Lungs:  normal respiratory effort and normal breath sounds.   Heart:  normal rate, regular rhythm, and no murmur.   Abdomen:  soft, non-tender, normal bowel sounds, and no guarding.   Msk:  no joint tenderness, no joint swelling, no joint warmth, and no redness over joints.   Pulses:  2+ radial Extremities:  no peripheral edema Neurologic:  alert & oriented X3, cranial nerves II-XII intact, and gait normal.   Skin:  turgor normal and color normal.   Cervical Nodes:  no anterior cervical adenopathy and no posterior cervical adenopathy.   Psych:  Oriented X3, memory intact for recent and remote, normally interactive, and good eye contact.     Impression & Recommendations:  Problem # 1:  MIXED HYPERLIPIDEMIA (ICD-272.2)  His updated medication list for this problem includes:    Crestor 10 Mg Tabs (Rosuvastatin calcium) .Marland Kitchen... Take one tablet by mouth  daily.  Labs Reviewed: SGOT: 19 (01/05/2010)   SGPT: 24 (01/05/2010)   HDL:47.30 (01/05/2010), 29.90 (02/02/2009)  LDL:25 (01/05/2010), 56 (02/02/2009)  Chol:99 (01/05/2010), 103 (02/02/2009)  Trig:132.0 (01/05/2010), 87.0 (02/02/2009)  Excellent control. Will continue crestor  Problem # 2:  CORONARY ARTERY BYPASS GRAFT, THREE VESSEL, HX OF (ICD-V45.81) Stable and recovered. He is current with cardiology. He has resumed all of his normal activities and is working on a Magazine features editor. It is ok to resume playing golf.  His updated medication list for this problem includes:    Lisinopril 5 Mg Tabs (Lisinopril) .Marland Kitchen... Take one tablet by mouth daily    Aspirin Ec 325 Mg Tbec (Aspirin) .Marland Kitchen... Take one tablet by mouth daily    Metoprolol Tartrate 25 Mg Tabs (Metoprolol tartrate) .Marland Kitchen... Take one tablet by mouth twice a day  Problem # 3:  HYPERTENSION (ICD-401.9)  His updated medication list for this problem includes:    Lisinopril 5 Mg Tabs (Lisinopril) .Marland Kitchen... Take one tablet by mouth daily    Metoprolol Tartrate 25 Mg Tabs (Metoprolol tartrate) .Marland Kitchen... Take one tablet by mouth twice a day  BP today: 106/64 Prior BP: 128/79 (03/29/2010)  Labs Reviewed: K+: 4.8 (01/05/2010) Creat: : 1.0 (01/05/2010)   Chol: 99 (01/05/2010)  HDL: 47.30 (01/05/2010)   LDL: 25 (01/05/2010)   TG: 132.0 (01/05/2010)  Excellent control. He has been asymptomatic. Will continue present medications.   Problem # 4:  GOUT (ICD-274.9) No recent flares  Problem # 5:  AODM (ICD-250.00) For A1C with recommendations to follow.  His updated medication list for this problem includes:    Lisinopril 5 Mg Tabs (Lisinopril) .Marland Kitchen... Take one tablet by mouth daily    Metformin Hcl 500 Mg Tb24 (Metformin hcl) .Marland Kitchen... Take 1 tablet by mouth two times a day    Aspirin Ec 325 Mg Tbec (Aspirin) .Marland Kitchen... Take one tablet by mouth daily  Orders: TLB-A1C / Hgb A1C (Glycohemoglobin) (83036-A1C) TLB-BMP (Basic Metabolic Panel-BMET)  ([id number])  Addendum - good control! Will continue present medications and regimen.  : Tyshawn Helms Note: All result statuses are Final unless otherwise noted.  Tests: (1) Hemoglobin A1C (A1C)   Hemoglobin A1C       [H]  6.7 %                       4.6-6.5     Glycemic Control Guidelines for People with Diabetes:     Non Diabetic:  <6%     Goal of Therapy: <7%     Additional Action Suggested:  >8%   Tests: (2) BMP (METABOL)   Sodium                    141 mEq/L                   135-145   Potassium            [H]  5.2 mEq/L                   3.5-5.1   Chloride                  104 mEq/L                   96-112   Carbon Dioxide            30 mEq/L                    19-32   Glucose                   77 mg/dL                    70-99   BUN                       19 mg/dL                    6-23   Creatinine                0.9 mg/dL                   0.4-1.5   Calcium                   9.4 mg/dL                   8.4-10.5  Complete Medication List: 1)  Crestor 10 Mg Tabs (Rosuvastatin calcium) .... Take one tablet by mouth daily. 2)  Lisinopril 5 Mg Tabs (Lisinopril) .... Take one tablet by mouth daily 3)  Metformin Hcl 500 Mg Tb24 (  Metformin hcl) .... Take 1 tablet by mouth two times a day 4)  Onetouch Ultrasoft Lancets Misc (Lancets) .... Test once daily as needed dx:250.00 5)  Flomax 0.4 Mg Cp24 (Tamsulosin hcl) .... Take 1 tablet by mouth once a day 6)  Multivitamins Tabs (Multiple vitamin) .... Take 1 tablet by mouth once a day 7)  Aspirin Ec 325 Mg Tbec (Aspirin) .... Take one tablet by mouth daily 8)  Metamucil Smooth Texture 28.3 % Powd (Psyllium) .... 2 tablespoons two times a day 9)  Alprazolam 0.25 Mg Tabs (Alprazolam) .... As directed as needed 10)  L-lysine Hcl 500 Mg Tabs (Lysine hcl) .... Take 2 tablet by mouth once a day 11)  Trueresult Blood Glucose W/device Kit (Blood glucose monitoring suppl) .... Check blood sugar every day dx 250.00 12)  Metoprolol  Tartrate 25 Mg Tabs (Metoprolol tartrate) .... Take one tablet by mouth twice a day 13)  Hydrocodone-acetaminophen 5-325 Mg Tabs (Hydrocodone-acetaminophen) .... As needed 14)  Finasteride 5 Mg Tabs (Finasteride) .... Take 1 by mouth once daily 15)  Indomethacin 25 Mg Caps (Indomethacin) .Marland Kitchen.. 1 by mouth three times a day as needed gout flare 16)  Oxycodone Hcl 5 Mg Tabs (Oxycodone hcl) .... As needed 17)  Test Strips For Glucometer  .... Dx250.00 test two times a day

## 2010-12-19 NOTE — Progress Notes (Signed)
  Phone Note Refill Request Message from:  Fax from Pharmacy on Mar 23, 2010 9:45 AM  Refills Requested: Medication #1:  FINASTERIDE 5 MG TABS take 1 by mouth once daily Initial call taken by: Ami Bullins CMA,  Mar 23, 2010 9:45 AM    Prescriptions: FINASTERIDE 5 MG TABS (FINASTERIDE) take 1 by mouth once daily  #30 x 6   Entered by:   Ami Bullins CMA   Authorized by:   Neena Rhymes MD   Signed by:   Charlynne Cousins CMA on 03/23/2010   Method used:   [facility]        [facility]  [id number]       [place], [place]  [address]       Ph: [phone] or [phone]       Fax: [phone]   RxID:   [id number]

## 2010-12-19 NOTE — Assessment & Plan Note (Signed)
Summary: cough/cold/cd   Vital Signs:  Patient profile:   76 year old male Height:      66 inches Weight:      196 pounds BMI:     31.75 O2 Sat:      97 % on Room air Temp:     96.5 degrees F oral Pulse rate:   70 / minute BP sitting:   102 / 60  (left arm) Cuff size:   large  Vitals Entered By: Charlynne Cousins CMA (December 02, 2009 9:50 AM)  O2 Flow:  Room air CC: pt here with complaint of ongoing coughing spells and states they are worse when laying down. Pt produces green mucous with cough and states he took  the promethazine codeine cough syrup but says when he took it his throat felt tight/ ab   Primary Care Provider:  Evert Kohl  CC:  pt here with complaint of ongoing coughing spells and states they are worse when laying down. Pt produces green mucous with cough and states he took  the promethazine codeine cough syrup but says when he took it his throat felt tight/ ab.  History of Present Illness: Seen on the 10th for URI with cough. He has taken the last azithromycin today. Explained that he has  bacterialcidal levels of antibiotics in his system for additional five days. He is still coughing and is congested.   Current Medications (verified): 1)  Crestor 10 Mg Tabs (Rosuvastatin Calcium) .... Take One Tablet By Mouth Daily. 2)  Lisinopril 5 Mg Tabs (Lisinopril) .... Take One Tablet By Mouth Daily 3)  Metformin Hcl 500 Mg Tb24 (Metformin Hcl) .... Take 1 Tablet By Mouth Two Times A Day 4)  Onetouch Ultrasoft Lancets  Misc (Lancets) .... Test Once Daily As Needed Dx:250.00 5)  Flomax 0.4 Mg  Cp24 (Tamsulosin Hcl) .... Take 1 Tablet By Mouth Once A Day 6)  Multivitamins   Tabs (Multiple Vitamin) .... Take 1 Tablet By Mouth Once A Day 7)  Aspirin Ec 325 Mg Tbec (Aspirin) .... Take One Tablet By Mouth Daily 8)  Metamucil Smooth Texture 28.3 %  Powd (Psyllium) .... 2 Tablespoons Two Times A Day 9)  Alprazolam 0.25 Mg  Tabs (Alprazolam) .... As Directed As Needed 10)   L-Lysine Hcl 500 Mg  Tabs (Lysine Hcl) .... Take 2 Tablet By Mouth Once A Day 11)  Trueresult Blood Glucose W/device Kit (Blood Glucose Monitoring Suppl) .... Check Blood Sugar Every Day Dx 250.00 12)  Metoprolol Tartrate 25 Mg Tabs (Metoprolol Tartrate) .... Take One Tablet By Mouth Twice A Day 13)  Hydrocodone-Acetaminophen 5-325 Mg Tabs (Hydrocodone-Acetaminophen) .... As Needed 14)  Finasteride 5 Mg Tabs (Finasteride) .... Take 1 By Mouth Once Daily 15)  Promethazine-Codeine 6.25-10 Mg/69m Syrp (Promethazine-Codeine) ..Marland Kitchen. 1 Tsp Q 6 As Needed Cough  Allergies (verified): 1)  ! Sulfa PMH-FH-SH reviewed-no changes except otherwise noted  Review of Systems       The patient complains of dyspnea on exertion and prolonged cough.  The patient denies anorexia, fever, weight loss, weight gain, chest pain, headaches, abdominal pain, muscle weakness, and transient blindness.    Physical Exam  General:  elderly heavyset white male in no distress Head:  Normocephalic and atraumatic without obvious abnormalities. No apparent alopecia or balding. No sinus tenderness Lungs:  normal respiratory effort, no intercostal retractions, no accessory muscle use, normal breath sounds, no dullness, no crackles, and no wheezes.   Heart:  normal rate and regular rhythm.  Impression & Recommendations:  Problem # 1:  URI (ICD-465.9) Persistent symptoms.  Plan - no additional antibiotics - azithromycin still active           continue with prom/cod 1 tsp q 6          psuedoephedrine 10m two times a day or three times a day           loratadine 168monce a day  His updated medication list for this problem includes:    Aspirin Ec 325 Mg Tbec (Aspirin) ...Marland Kitchen. Take one tablet by mouth daily    Promethazine-codeine 6.25-10 Mg/67m29myrp (Promethazine-codeine) ....Marland Kitchen 1 tsp q 6 as needed cough  Complete Medication List: 1)  Crestor 10 Mg Tabs (Rosuvastatin calcium) .... Take one tablet by mouth daily. 2)   Lisinopril 5 Mg Tabs (Lisinopril) .... Take one tablet by mouth daily 3)  Metformin Hcl 500 Mg Tb24 (Metformin hcl) .... Take 1 tablet by mouth two times a day 4)  Onetouch Ultrasoft Lancets Misc (Lancets) .... Test once daily as needed dx:250.00 5)  Flomax 0.4 Mg Cp24 (Tamsulosin hcl) .... Take 1 tablet by mouth once a day 6)  Multivitamins Tabs (Multiple vitamin) .... Take 1 tablet by mouth once a day 7)  Aspirin Ec 325 Mg Tbec (Aspirin) .... Take one tablet by mouth daily 8)  Metamucil Smooth Texture 28.3 % Powd (Psyllium) .... 2 tablespoons two times a day 9)  Alprazolam 0.25 Mg Tabs (Alprazolam) .... As directed as needed 10)  L-lysine Hcl 500 Mg Tabs (Lysine hcl) .... Take 2 tablet by mouth once a day 11)  Trueresult Blood Glucose W/device Kit (Blood glucose monitoring suppl) .... Check blood sugar every day dx 250.00 12)  Metoprolol Tartrate 25 Mg Tabs (Metoprolol tartrate) .... Take one tablet by mouth twice a day 13)  Hydrocodone-acetaminophen 5-325 Mg Tabs (Hydrocodone-acetaminophen) .... As needed 14)  Finasteride 5 Mg Tabs (Finasteride) .... Take 1 by mouth once daily 15)  Promethazine-codeine 6.25-10 Mg/67ml10mrp (Promethazine-codeine) .... Marland Kitchen tsp q 6 as needed cough  Patient Instructions: 1)  Upper respiratory infection - lungs are clear and oxygen level is good. There will be effective levels of antibiotic in your system for an additional five days - no need for additional antibiotics. For congestion and drip take pseudoephedrine 30mg64m times a day or three times a day (get from pharmacist) and loratadine 10mg 46m a day for the drip.  continue with chicken soup and fluids and tylenol for fever and aches.

## 2010-12-19 NOTE — Progress Notes (Signed)
Phone Note Refill Request Message from:  Fax from Pharmacy on August 09, 2010 8:39 AM  Refills Requested: Medication #1:  METFORMIN HCL 500 MG TB24 Take 1 tablet by mouth two times a day  Medication #2:  LISINOPRIL 5 MG TABS Take one tablet by mouth daily  Medication #3:  FLOMAX 0.4 MG  CP24 Take 1 tablet by mouth once a day  Medication #4:  CRESTOR 10 MG TABS Take one tablet by mouth daily.    Prescriptions: FINASTERIDE 5 MG TABS (FINASTERIDE) take 1 by mouth once daily  #90 x 2   Entered by:   Ami Bullins CMA   Authorized by:   Neena Rhymes MD   Signed by:   Charlynne Cousins CMA on 08/09/2010   Method used:   Faxed to ...       [facility] (mail-order)             , [place]         Ph: [phone]       Fax: [phone]   RxID:   [id number] METOPROLOL TARTRATE 25 MG TABS (METOPROLOL TARTRATE) Take one tablet by mouth twice a day  #180 x 2   Entered by:   Ami Bullins CMA   Authorized by:   Neena Rhymes MD   Signed by:   Charlynne Cousins CMA on 08/09/2010   Method used:   Faxed to ...       [facility] (mail-order)             , [place]         Ph: [phone]       Fax: [phone]   RxID:   [id number] FLOMAX 0.4 MG  CP24 (TAMSULOSIN HCL) Take 1 tablet by mouth once a day  #90 x 2   Entered by:   Ami Bullins CMA   Authorized by:   Neena Rhymes MD   Signed by:   Charlynne Cousins CMA on 08/09/2010   Method used:   Faxed to ...       [facility] (mail-order)             , [place]         Ph: [phone]       Fax: [phone]   RxID:   [id number] METFORMIN HCL 500 MG TB24 (METFORMIN HCL) Take 1 tablet by mouth two times a day  #180 Tablet x 2   Entered by:   Ami Bullins CMA   Authorized by:   Neena Rhymes MD   Signed by:   Charlynne Cousins CMA on 08/09/2010   Method used:   Faxed to ...       [facility] (mail-order)             , [place]         Ph: [phone]       Fax: [phone]   RxID:   [id number] LISINOPRIL 5 MG TABS (LISINOPRIL) Take one tablet by mouth daily  #90  x 2   Entered by:   Ami Bullins CMA   Authorized by:   Neena Rhymes MD   Signed by:   Charlynne Cousins CMA on 08/09/2010   Method used:   Faxed to ...       [facility] (mail-order)             , [place]         Ph: [phone]       Fax: [phone]  RxID:   [id number] CRESTOR 10 MG TABS (ROSUVASTATIN CALCIUM) Take one tablet by mouth daily.  #90 x 2   Entered by:   Charlynne Cousins CMA   Authorized by:   Neena Rhymes MD   Signed by:   Charlynne Cousins CMA on 08/09/2010   Method used:   Faxed to ...       [facility] (mail-order)             , [place]         Ph: [phone]       Fax: [phone]   RxID:   [id number]

## 2010-12-19 NOTE — Assessment & Plan Note (Signed)
Summary: f87mdm  Medications Added OXYCODONE HCL 5 MG TABS (OXYCODONE HCL) as needed      Allergies Added:   Primary Provider:  MEvert Kohl  History of Present Illness: Ricardo Washington is seen today post CABG.  He was done in 8/10 by Dr HRoxan Hockey   He had a lima to lad, svg PDA/PLA and svg OM.  His LV functoin is preserved. He is active and will finish cardiac rehab in December.  [place].  His BP was a little low at rehab and we decreased his lisinopril to 575m  He has not had any SSCP, palpitations or dizzyness. He has been compliant with his meds.  Samples of Crestor were given since he is in the donut hole.  He has multiple somatic complaints including lower back pain.  He was told it is ok to go out in hot weather, do yard work and golf.    Current Problems (verified): 1)  Cataract, Hx of  (ICD-V12.40) 2)  Hypertrophy Prostate w/o Ur Obst & Oth Luts  (ICD-600.00) 3)  Mixed Hyperlipidemia  (ICD-272.2) 4)  Coronary Artery Bypass Graft, Three Vessel, Hx of  (ICD-V45.81) 5)  Percutaneous Transluminal Coronary Angioplasty, Hx of  (ICD-V45.82) 6)  Postoperative Volume Overload  () 7)  Postoperative Anemia  () 8)  Coronary Artery Disease  (ICD-414.00) 9)  Fatigue  (ICD-780.79) 10)  Tobacco Use, Quit  (ICD-V15.82) 11)  Diverticulosis of Colon  (ICD-562.10) 12)  Personal History of Colonic Polyps  (ICD-V12.72) 13)  Rectal Bleeding  (ICD-569.3) 14)  Other and Unspecified Hyperlipidemia  (ICD-272.4) 15)  Hypogonadism  (ICD-257.2) 16)  Hypertension  (ICD-401.9) 17)  Gout  (ICD-274.9) 18)  Aodm  (ICD-250.00)  Current Medications (verified): 1)  Crestor 10 Mg Tabs (Rosuvastatin Calcium) .... Take One Tablet By Mouth Daily. 2)  Lisinopril 5 Mg Tabs (Lisinopril) .... Take One Tablet By Mouth Daily 3)  Metformin Hcl 500 Mg Tb24 (Metformin Hcl) .... Take 1 Tablet By Mouth Two Times A Day 4)  Onetouch Ultrasoft Lancets   Misc (Lancets) .... Test Once Daily As Needed Dx:250.00 5)  Flomax 0.4 Mg  Cp24 (Tamsulosin Hcl) .... Take 1 Tablet By Mouth Once A Day 6)  Multivitamins   Tabs (Multiple Vitamin) .... Take 1 Tablet By Mouth Once A Day 7)  Aspirin Ec 325 Mg Tbec (Aspirin) .... Take One Tablet By Mouth Daily 8)  Metamucil Smooth Texture 28.3 %  Powd (Psyllium) .... 2 Tablespoons Two Times A Day 9)  Alprazolam 0.25 Mg  Tabs (Alprazolam) .... As Directed As Needed 10)  L-Lysine Hcl 500 Mg  Tabs (Lysine Hcl) .... Take 2 Tablet By Mouth Once A Day 11)  Trueresult Blood Glucose W/device Kit (Blood Glucose Monitoring Suppl) .... Check Blood Sugar Every Day Dx 250.00 12)  Metoprolol Tartrate 25 Mg Tabs (Metoprolol Tartrate) .... Take One Tablet By Mouth Twice A Day 13)  Hydrocodone-Acetaminophen 5-325 Mg Tabs (Hydrocodone-Acetaminophen) .... As Needed 14)  Finasteride 5 Mg Tabs (Finasteride) .... Take 1 By Mouth Once Daily 15)  Indomethacin 25 Mg Caps (Indomethacin) ...Marland Kitchen 1 By Mouth Three Times A Day As Needed Gout Flare 16)  Oxycodone Hcl 5 Mg Tabs (Oxycodone Hcl) .... As Needed  Allergies (verified): 1)  ! Sulfa  Past History:  Past Medical History: Last updated: 01/05/2010 CATARACT, HX OF (ICD-V12.40) HYPERTROPHY PROSTATE W/O UR OBST & OTH LUTS (ICD-600.00) MIXED HYPERLIPIDEMIA (ICD-272.2) CORONARY ARTERY BYPASS  GRAFT, THREE VESSEL, HX OF (ICD-V45.81) PERCUTANEOUS TRANSLUMINAL CORONARY ANGIOPLASTY, HX OF (ICD-V45.82) * POSTOPERATIVE VOLUME OVERLOAD * POSTOPERATIVE ANEMIA CORONARY ARTERY DISEASE (ICD-414.00) FATIGUE (ICD-780.79) TOBACCO USE, QUIT (ICD-V15.82) DIVERTICULOSIS OF COLON (ICD-562.10)-adenomatous '02 PERSONAL HISTORY OF COLONIC POLYPS (ICD-V12.72) RECTAL BLEEDING (ICD-569.3) OTHER AND UNSPECIFIED HYPERLIPIDEMIA (ICD-272.4) HYPOGONADISM (ICD-257.2) HYPERTENSION (ICD-401.9) GOUT (ICD-274.9) AODM (ICD-250.00)  Past Surgical History: Last updated:  08/07/2009 Tonsillectomy Vasectomy Umbilical Hernia Repain 4081 Bilateral cataract extraction and lens implants Current Problems:  PERCUTANEOUS TRANSLUMINAL CORONARY ANGIOPLASTY, HX OF (ICD-V45.82) 07-13-2009 CORONARY ARTERY BYPASS GRAFT, THREE VESSEL, HX OF (ICD-V45.81) 07-14-2009   Hendrickson Median sternotomy extracorporeal circulation coronary   artery bypass grafting x4 (left internal mammary artery to left anterior   descending, saphenous vein graft to first obtuse marginal, sequential   saphenous vein graft to posterior descending and posterior lateral),   endoscopic vein harvest right thigh  Family History: Last updated: 05-23-08 father-deceased @59 : cancer -gastric mother- deceased @62 : cancer death Neg- colon or prostate cancer; CAD, CVA, DM  Social History: Last updated: 02/22/2009 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  Alcohol Use - no Illicit Drug Use - no  Review of Systems       Denies fever, malais, weight loss, blurry vision, decreased visual acuity, cough, sputum, SOB, hemoptysis, pleuritic pain, palpitaitons, heartburn, abdominal pain, melena, lower extremity edema, claudication, or rash.   Vital Signs:  Patient profile:   76 year old male Height:      66 inches Weight:      201 pounds BMI:     32.56 Pulse rate:   63 / minute Resp:     12 per minute BP sitting:   128 / 79  (left arm)  Vitals Entered By: Burnett Kanaris (Mar 29, 2010 10:08 AM)  Physical Exam  General:  Affect appropriate Healthy:  appears stated age 32: normal Neck supple with no adenopathy JVP normal no bruits no thyromegaly Lungs clear with no wheezing and good diaphragmatic motion Heart:  S1/S2 no murmur,rub, gallop or click PMI normal Abdomen: benighn, BS positve, no tenderness, no AAA no bruit.  No HSM or  HJR Distal pulses intact with no bruits No edema Neuro non-focal Skin warm and dry Sternum well healed   Impression & Recommendations:  Problem # 1:  MIXED HYPERLIPIDEMIA (ICD-272.2)  At goal with no side effects His updated medication list for this problem includes:    Crestor 10 Mg Tabs (Rosuvastatin calcium) .Marland Kitchen... Take one tablet by mouth daily.  CHOL: 99 (01/05/2010)   LDL: 25 (01/05/2010)   HDL: 47.30 (01/05/2010)   TG: 132.0 (01/05/2010)  His updated medication list for this problem includes:    Crestor 10 Mg Tabs (Rosuvastatin calcium) .Marland Kitchen... Take one tablet by mouth daily.  Problem # 2:  CORONARY ARTERY BYPASS GRAFT, THREE VESSEL, HX OF (ICD-V45.81) Well healed.  Continue ASA  No restrictions CABG 8/10  Problem # 3:  HYPERTENSION (ICD-401.9)  Well controlled low sodium diet His updated medication list for this problem includes:    Lisinopril 5 Mg Tabs (Lisinopril) .Marland Kitchen... Take one tablet by mouth daily    Aspirin Ec 325 Mg Tbec (Aspirin) .Marland Kitchen... Take one tablet by mouth daily    Metoprolol Tartrate 25 Mg Tabs (Metoprolol tartrate) .Marland Kitchen... Take one tablet by mouth twice a day  His updated medication list for this problem includes:  Lisinopril 5 Mg Tabs (Lisinopril) .Marland Kitchen... Take one tablet by mouth daily    Aspirin Ec 325 Mg Tbec (Aspirin) .Marland Kitchen... Take one tablet by mouth daily    Metoprolol Tartrate 25 Mg Tabs (Metoprolol tartrate) .Marland Kitchen... Take one tablet by mouth twice a day  Patient Instructions: 1)  Your physician recommends that you schedule a follow-up appointment in: 1 year with Dr. Johnsie Cancel 2)  Your physician recommends that you continue on your current medications as directed. Please refer to the Current Medication list given to you today.

## 2010-12-19 NOTE — Assessment & Plan Note (Signed)
Summary: COLD/NWS   Vital Signs:  Patient profile:   76 year old male Height:      66 inches Weight:      197 pounds BMI:     31.91 O2 Sat:      97 % on Room air Temp:     96.2 degrees F oral Pulse rate:   91 / minute BP sitting:   122 / 68  (left arm) Cuff size:   large  Vitals Entered By: Charlynne Cousins CMA (November 28, 2009 9:04 AM)  O2 Flow:  Room air CC: pt here with complaint of hacking cough with liitle mucous and sore throat x 5 days/ ab   Primary Care Provider:  Evert Kohl  CC:  pt here with complaint of hacking cough with liitle mucous and sore throat x 5 days/ ab.  History of Present Illness: Patient with a 4 days h/o fever, myalgias, cough that is not productive. He has not responded well to otc cough syrup or left over amoxicillin. He is in no acute distress and has no SOB.  Current Medications (verified): 1)  Crestor 10 Mg Tabs (Rosuvastatin Calcium) .... Take One Tablet By Mouth Daily. 2)  Lisinopril 5 Mg Tabs (Lisinopril) .... Take One Tablet By Mouth Daily 3)  Metformin Hcl 500 Mg Tb24 (Metformin Hcl) .... Take 1 Tablet By Mouth Two Times A Day 4)  Onetouch Ultrasoft Lancets  Misc (Lancets) .... Test Once Daily As Needed Dx:250.00 5)  Flomax 0.4 Mg  Cp24 (Tamsulosin Hcl) .... Take 1 Tablet By Mouth Once A Day 6)  Multivitamins   Tabs (Multiple Vitamin) .... Take 1 Tablet By Mouth Once A Day 7)  Aspirin Ec 325 Mg Tbec (Aspirin) .... Take One Tablet By Mouth Daily 8)  Metamucil Smooth Texture 28.3 %  Powd (Psyllium) .... 2 Tablespoons Two Times A Day 9)  Alprazolam 0.25 Mg  Tabs (Alprazolam) .... As Directed As Needed 10)  L-Lysine Hcl 500 Mg  Tabs (Lysine Hcl) .... Take 2 Tablet By Mouth Once A Day 11)  Trueresult Blood Glucose W/device Kit (Blood Glucose Monitoring Suppl) .... Check Blood Sugar Every Day Dx 250.00 12)  Metoprolol Tartrate 25 Mg Tabs (Metoprolol Tartrate) .... Take One Tablet By Mouth Twice A Day 13)  Hydrocodone-Acetaminophen 5-325 Mg  Tabs (Hydrocodone-Acetaminophen) .... As Needed 14)  Finasteride 5 Mg Tabs (Finasteride) .... Take 1 By Mouth Once Daily  Allergies (verified): 1)  ! Sulfa  Past History:  Past Medical History: Last updated: 08/07/2009 Diabetes mellitus, type I Gout Hypertension Adenomatous colon polyp 11-04-2001 Diverticulosis BPH Cataracts PERCUTANEOUS TRANSLUMINAL CORONARY ANGIOPLASTY, HX OF (ICD-V45.82) * POSTOPERATIVE VOLUME OVERLOAD * POSTOPERATIVE ANEMIA CORONARY ARTERY DISEASE (ICD-414.00) FATIGUE (ICD-780.79) TOBACCO USE, QUIT (ICD-V15.82) DIVERTICULOSIS OF COLON (ICD-562.10) PERSONAL HISTORY OF COLONIC POLYPS (ICD-V12.72) RECTAL BLEEDING (ICD-569.3) OTHER AND UNSPECIFIED HYPERLIPIDEMIA (ICD-272.4) RASH AND OTHER NONSPECIFIC SKIN ERUPTION (ICD-782.1) HYPOGONADISM (ICD-257.2) HYPERTENSION (ICD-401.9) GOUT (ICD-274.9) AODM (ICD-250.00)  Past Surgical History: Last updated: 08/07/2009 Tonsillectomy Vasectomy Umbilical Hernia Repain 1497 Bilateral cataract extraction and lens implants Current Problems:  PERCUTANEOUS TRANSLUMINAL CORONARY ANGIOPLASTY, HX OF (ICD-V45.82) 07-13-2009 CORONARY ARTERY BYPASS GRAFT, THREE VESSEL, HX OF (ICD-V45.81) 07-14-2009   Hendrickson Median sternotomy extracorporeal circulation coronary   artery bypass grafting x4 (left internal mammary artery to left anterior   descending, saphenous vein graft to first obtuse marginal, sequential   saphenous vein graft to posterior descending and posterior lateral),   endoscopic vein harvest right thigh  Family History: Last updated: Jun 08, 2008 father-deceased @59 : cancer -gastric  mother- deceased @62 : cancer death Neg- colon or prostate cancer; CAD, CVA, DM  Social History: Last updated: 02/22/2009 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  Alcohol Use -  no Illicit Drug Use - no  Risk Factors: Caffeine Use: 1 (05/14/2008) Exercise: yes (05/14/2008)  Risk Factors: Smoking Status: quit (02/22/2009) Passive Smoke Exposure: no (05/14/2008)  Review of Systems       The patient complains of fever, prolonged cough, and headaches.  The patient denies anorexia, weight loss, weight gain, decreased hearing, syncope, dyspnea on exertion, peripheral edema, hemoptysis, hematochezia, muscle weakness, depression, and enlarged lymph nodes.    Physical Exam  General:  WNWD overwieght white male in no distress Head:  no sinus tenderness to percussion  Eyes:  corneas and lenses clear and no injection.   Mouth:  throat clear Neck:  full ROM.   Lungs:  normal respiratory effort, no intercostal retractions, no accessory muscle use, normal breath sounds, no crackles, and no wheezes.   Heart:  normal rate and regular rhythm.   Abdomen:  Bowel sounds positive,abdomen soft and non-tender without masses, organomegaly or hernias noted. Msk:  normal ROM and no joint swelling.   Pulses:  2+ radial Neurologic:  alert & oriented X3 and gait normal.   Skin:  turgor normal, color normal, and no ulcerations.   Cervical Nodes:  no anterior cervical adenopathy and no posterior cervical adenopathy.   Psych:  Oriented X3, memory intact for recent and remote, and good eye contact.     Impression & Recommendations:  Problem # 1:  URI (ICD-465.9) Probable viral infection but cannot r/o atypicals  Plan - z-pak as directed           prom/cod for cough.   His updated medication list for this problem includes:    Aspirin Ec 325 Mg Tbec (Aspirin) .Marland Kitchen... Take one tablet by mouth daily    Promethazine-codeine 6.25-10 Mg/18m Syrp (Promethazine-codeine) ..Marland Kitchen.. 1 tsp q 6 as needed cough  Complete Medication List: 1)  Crestor 10 Mg Tabs (Rosuvastatin calcium) .... Take one tablet by mouth daily. 2)  Lisinopril 5 Mg Tabs (Lisinopril) .... Take one tablet by mouth daily 3)   Metformin Hcl 500 Mg Tb24 (Metformin hcl) .... Take 1 tablet by mouth two times a day 4)  Onetouch Ultrasoft Lancets Misc (Lancets) .... Test once daily as needed dx:250.00 5)  Flomax 0.4 Mg Cp24 (Tamsulosin hcl) .... Take 1 tablet by mouth once a day 6)  Multivitamins Tabs (Multiple vitamin) .... Take 1 tablet by mouth once a day 7)  Aspirin Ec 325 Mg Tbec (Aspirin) .... Take one tablet by mouth daily 8)  Metamucil Smooth Texture 28.3 % Powd (Psyllium) .... 2 tablespoons two times a day 9)  Alprazolam 0.25 Mg Tabs (Alprazolam) .... As directed as needed 10)  L-lysine Hcl 500 Mg Tabs (Lysine hcl) .... Take 2 tablet by mouth once a day 11)  Trueresult Blood Glucose W/device Kit (Blood glucose monitoring suppl) .... Check blood sugar every day dx 250.00 12)  Metoprolol Tartrate 25 Mg Tabs (Metoprolol tartrate) .... Take one tablet by mouth twice a day 13)  Hydrocodone-acetaminophen 5-325 Mg Tabs (Hydrocodone-acetaminophen) .... As needed 14)  Finasteride 5 Mg Tabs (Finasteride) .... Take 1 by mouth once daily 15)  Promethazine-codeine 6.25-10 Mg/583mSyrp (Promethazine-codeine) ...Marland Kitchen 1 tsp q 6 as needed  cough  Patient Instructions: 1)  U[pper respiratory infection - 80% chance viral, 20% chance aytpical bacterial infection. Plan - z-pak (takie it all), promethazine with codeine for cough as directed, tylenol for fever, vitamin C, hhydrate, rest.

## 2010-12-19 NOTE — Assessment & Plan Note (Signed)
Summary: f/u appt/cd   Vital Signs:  Patient profile:   76 year old male Height:      66 inches Weight:      198.75 pounds BMI:     32.20 O2 Sat:      96 % on Room air Temp:     97.5 degrees F oral Pulse rate:   75 / minute BP sitting:   108 / 58  (left arm) Cuff size:   large  Vitals Entered By: Rebeca Alert (January 05, 2010 10:37 AM)  O2 Flow:  Room air CC: pt here for f/u visit/aj   Primary Care Provider:  Evert Kohl  CC:  pt here for f/u visit/aj.  History of Present Illness: Patient presents for medical follow-up. He has graduated from cardiac rehab but he continues to exercise on a regular basis with walking and stationary bike. He hasn't had frank chest pain but has had some sensation on the left chest of a minor nature. No SOB. He has been released by [facility]. He was last seen in cardiology in November. He will be seen back in April. He is 6 months out from CABG and is back up to full steam.  He has had good BP control. CBGs running 100-120. He is due for rouitne lab.  He has had a couple of bouts of gout associated with red meat in his meals.   Current Medications (verified): 1)  Crestor 10 Mg Tabs (Rosuvastatin Calcium) .... Take One Tablet By Mouth Daily. 2)  Lisinopril 5 Mg Tabs (Lisinopril) .... Take One Tablet By Mouth Daily 3)  Metformin Hcl 500 Mg Tb24 (Metformin Hcl) .... Take 1 Tablet By Mouth Two Times A Day 4)  Onetouch Ultrasoft Lancets  Misc (Lancets) .... Test Once Daily As Needed Dx:250.00 5)  Flomax 0.4 Mg  Cp24 (Tamsulosin Hcl) .... Take 1 Tablet By Mouth Once A Day 6)  Multivitamins   Tabs (Multiple Vitamin) .... Take 1 Tablet By Mouth Once A Day 7)  Aspirin Ec 325 Mg Tbec (Aspirin) .... Take One Tablet By Mouth Daily 8)  Metamucil Smooth Texture 28.3 %  Powd (Psyllium) .... 2 Tablespoons Two Times A Day 9)  Alprazolam 0.25 Mg  Tabs (Alprazolam) .... As Directed As Needed 10)  L-Lysine Hcl 500 Mg  Tabs (Lysine Hcl) .... Take 2 Tablet By  Mouth Once A Day 11)  Trueresult Blood Glucose W/device Kit (Blood Glucose Monitoring Suppl) .... Check Blood Sugar Every Day Dx 250.00 12)  Metoprolol Tartrate 25 Mg Tabs (Metoprolol Tartrate) .... Take One Tablet By Mouth Twice A Day 13)  Hydrocodone-Acetaminophen 5-325 Mg Tabs (Hydrocodone-Acetaminophen) .... As Needed 14)  Finasteride 5 Mg Tabs (Finasteride) .... Take 1 By Mouth Once Daily  Allergies (verified): 1)  ! 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  Alcohol Use - no Illicit Drug Use - no  Risk Factors: Caffeine Use: 1 (05-28-2008) Exercise: yes (05-28-2008)  Risk Factors: Smoking Status: quit (02/22/2009) Passive Smoke Exposure: no (2008-05-28)  Past Medical History: CATARACT, HX OF (ICD-V12.40) HYPERTROPHY PROSTATE W/O UR OBST & OTH LUTS (ICD-600.00) MIXED HYPERLIPIDEMIA (ICD-272.2) CORONARY ARTERY BYPASS GRAFT, THREE VESSEL, HX OF (  ICD-V45.81) PERCUTANEOUS TRANSLUMINAL CORONARY ANGIOPLASTY, HX OF (ICD-V45.82) * POSTOPERATIVE VOLUME OVERLOAD * POSTOPERATIVE ANEMIA CORONARY ARTERY DISEASE (ICD-414.00) FATIGUE (ICD-780.79) TOBACCO USE, QUIT (ICD-V15.82) DIVERTICULOSIS OF COLON (ICD-562.10)-adenomatous '02 PERSONAL HISTORY OF COLONIC POLYPS (ICD-V12.72) RECTAL BLEEDING (ICD-569.3) OTHER AND UNSPECIFIED HYPERLIPIDEMIA (ICD-272.4) HYPOGONADISM (ICD-257.2) HYPERTENSION (ICD-401.9) GOUT (ICD-274.9) AODM (ICD-250.00)  Past Surgical History: Reviewed history from 08/07/2009 and no changes required. Tonsillectomy Vasectomy Umbilical Hernia Repain 4076 Bilateral cataract extraction and  lens implants Current Problems:  PERCUTANEOUS TRANSLUMINAL CORONARY ANGIOPLASTY, HX OF (ICD-V45.82) 07-13-2009 CORONARY ARTERY BYPASS GRAFT, THREE VESSEL, HX OF (ICD-V45.81) 07-14-2009   Hendrickson Median sternotomy extracorporeal circulation coronary   artery bypass grafting x4 (left internal mammary artery to left anterior   descending, saphenous vein graft to first obtuse marginal, sequential   saphenous vein graft to posterior descending and posterior lateral),   endoscopic vein harvest right thigh  Family History: Reviewed history from 05/14/2008 and no changes required. father-deceased @59 : cancer -gastric mother- deceased @62 : cancer death Neg- colon or prostate cancer; CAD, CVA, DM  Social History: Reviewed history from 02/22/2009 and no changes required. 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  Alcohol Use - no Illicit Drug Use - no  Review of Systems  The patient denies anorexia, fever, weight loss, weight gain, decreased hearing, chest pain, syncope, dyspnea on exertion, peripheral edema, hemoptysis, abdominal pain, hematuria, muscle weakness, difficulty walking, depression, enlarged lymph nodes, and angioedema.    Physical Exam  General:  overweight white male in no distress Head:  Normocephalic and atraumatic without obvious abnormalities. No apparent alopecia or balding. Eyes:  vision grossly intact, pupils equal, pupils round, corneas and lenses clear, no injection, and no optic disk abnormalities.   Ears:  External ear exam shows no significant lesions or deformities.  Otoscopic examination reveals clear canals, tympanic membranes are intact bilaterally without bulging, retraction, inflammation or discharge. Hearing is grossly normal bilaterally. Neck:  full ROM and no thyromegaly.   Chest Wall:  well healed  sternotomy Lungs:  Normal respiratory effort, chest expands symmetrically. Lungs are clear to auscultation, no crackles or wheezes. Heart:  Normal rate and regular rhythm. S1 and S2 normal without gallop, murmur, click, rub or other extra sounds. Abdomen:  obese, soft, non-tender, normal bowel sounds, and no guarding.   Msk:  normal ROM, no joint swelling, no joint warmth, and no redness over joints.   Pulses:  2+ radial  Extremities:  No clubbing, cyanosis, edema, or deformity noted with normal full range of motion of all joints.   Neurologic:  alert & oriented X3, cranial nerves II-XII intact, strength normal in all extremities, and gait normal.   Skin:  turgor normal, color normal, no rashes, and no ulcerations.   Cervical Nodes:  no anterior cervical adenopathy and no posterior cervical adenopathy.   Psych:  Oriented X3, memory intact for recent and remote, normally interactive, and good eye contact.     Impression & Recommendations:  Problem # 1:  HYPERTROPHY PROSTATE W/O UR OBST & OTH LUTS (ICD-600.00) no significant symptoms - well controlled on present medications  His updated medication list for this problem includes:    Flomax 0.4 Mg Cp24 (Tamsulosin hcl) .Marland Kitchen... Take 1 tablet by mouth once a day    Finasteride 5 Mg Tabs (Finasteride) .Marland Kitchen... Take 1 by mouth once daily  Problem # 2:  MIXED HYPERLIPIDEMIA (  ICD-272.2) Due for f/u lab with recommendations to follow  His updated medication list for this problem includes:    Crestor 10 Mg Tabs (Rosuvastatin calcium) .Marland Kitchen... Take one tablet by mouth daily.  Orders: TLB-Lipid Panel (80061-LIPID) TLB-Hepatic/Liver Function Pnl (80076-HEPATIC)  Addendum - LDL 25, HDL 47.3  Problem # 3:  CORONARY ARTERY DISEASE (ICD-414.00) Doing very well - symptom free. Current with cardiology follow-up  His updated medication list for this problem includes:    Lisinopril 5 Mg Tabs (Lisinopril) .Marland Kitchen... Take one tablet by mouth daily    Aspirin Ec 325  Mg Tbec (Aspirin) .Marland Kitchen... Take one tablet by mouth daily    Metoprolol Tartrate 25 Mg Tabs (Metoprolol tartrate) .Marland Kitchen... Take one tablet by mouth twice a day  Problem # 4:  HYPERTENSION (ICD-401.9) Good control  Plan - continue present meds  His updated medication list for this problem includes:    Lisinopril 5 Mg Tabs (Lisinopril) .Marland Kitchen... Take one tablet by mouth daily    Metoprolol Tartrate 25 Mg Tabs (Metoprolol tartrate) .Marland Kitchen... Take one tablet by mouth twice a day  Orders: TLB-BMP (Basic Metabolic Panel-BMET) (16109-UEAVWUJ)  addendum- labs are normal  Problem # 5:  AODM (ICD-250.00) Due for A1C  His updated medication list for this problem includes:    Lisinopril 5 Mg Tabs (Lisinopril) .Marland Kitchen... Take one tablet by mouth daily    Metformin Hcl 500 Mg Tb24 (Metformin hcl) .Marland Kitchen... Take 1 tablet by mouth two times a day    Aspirin Ec 325 Mg Tbec (Aspirin) .Marland Kitchen... Take one tablet by mouth daily  Orders: TLB-A1C / Hgb A1C (Glycohemoglobin) (83036-A1C)  Addendum - A1C 6.7%  stable  Plan - continue present meds.   Complete Medication List: 1)  Crestor 10 Mg Tabs (Rosuvastatin calcium) .... Take one tablet by mouth daily. 2)  Lisinopril 5 Mg Tabs (Lisinopril) .... Take one tablet by mouth daily 3)  Metformin Hcl 500 Mg Tb24 (Metformin hcl) .... Take 1 tablet by mouth two times a day 4)  Onetouch Ultrasoft Lancets Misc (Lancets) .... Test once daily as needed dx:250.00 5)  Flomax 0.4 Mg Cp24 (Tamsulosin hcl) .... Take 1 tablet by mouth once a day 6)  Multivitamins Tabs (Multiple vitamin) .... Take 1 tablet by mouth once a day 7)  Aspirin Ec 325 Mg Tbec (Aspirin) .... Take one tablet by mouth daily 8)  Metamucil Smooth Texture 28.3 % Powd (Psyllium) .... 2 tablespoons two times a day 9)  Alprazolam 0.25 Mg Tabs (Alprazolam) .... As directed as needed 10)  L-lysine Hcl 500 Mg Tabs (Lysine hcl) .... Take 2 tablet by mouth once a day 11)  Trueresult Blood Glucose W/device Kit (Blood glucose  monitoring suppl) .... Check blood sugar every day dx 250.00 12)  Metoprolol Tartrate 25 Mg Tabs (Metoprolol tartrate) .... Take one tablet by mouth twice a day 13)  Hydrocodone-acetaminophen 5-325 Mg Tabs (Hydrocodone-acetaminophen) .... As needed 14)  Finasteride 5 Mg Tabs (Finasteride) .... Take 1 by mouth once daily 15)  Indomethacin 25 Mg Caps (Indomethacin) .Marland Kitchen.. 1 by mouth three times a day as needed gout flare  Other Orders: TLB-Uric Acid, Blood (84550-URIC)   Patient: Ricardo Washington Note: All result statuses are Final unless otherwise noted.  Tests: (1) BMP (METABOL)   Sodium                    143 mEq/L                   135-145  Potassium                 4.8 mEq/L                   3.5-5.1   Chloride                  107 mEq/L                   96-112   Carbon Dioxide            32 mEq/L                    19-32   Glucose                   81 mg/dL                    70-99   BUN                       19 mg/dL                    6-23   Creatinine                1.0 mg/dL                   0.4-1.5   Calcium                   9.4 mg/dL                   8.4-10.5   GFR                       77.84 mL/min                >60  Tests: (2) Uric Acid (URIC)   Uric Acid                 7.1 mg/dL                   4.0-7.8  Tests: (3) Hemoglobin A1C (A1C)   Hemoglobin A1C       [H]  6.7 %                       4.6-6.5     Glycemic Control Guidelines for People with Diabetes:     Non Diabetic:  <6%     Goal of Therapy: <7%     Additional Action Suggested:  >8%   Tests: (4) Lipid Panel (LIPID)   Cholesterol               99 mg/dL                    0-200     ATP III Classification            Desirable:  < 200 mg/dL                    Borderline High:  200 - 239 mg/dL               High:  > = 240 mg/dL   Triglycerides             132.0 mg/dL  0.0-149.0     Normal:  <150 mg/dL     Borderline High:  150 - 199 mg/dL   HDL                       47.30 mg/dL                  >39.00   VLDL Cholesterol          26.4 mg/dL                  0.0-40.0   LDL Cholesterol           25 mg/dL                    0-99  CHO/HDL Ratio:  CHD Risk                             2                    Men          Women     1/2 Average Risk     3.4          3.3     Average Risk          5.0          4.4     2X Average Risk          9.6          7.1     3X Average Risk          15.0          11.0                           Tests: (5) Hepatic/Liver Function Panel (HEPATIC)   Total Bilirubin           0.6 mg/dL                   0.3-1.2   Direct Bilirubin          0.1 mg/dL                   0.0-0.3   Alkaline Phosphatase      79 U/L                      39-117   AST                       19 U/L                      0-37   ALT                       24 U/L                      0-53   Total Protein             7.0 g/dL                    6.0-8.3   Albumin                   4.0 g/dL  3.5-5.2Prescriptions: ALPRAZOLAM 0.25 MG  TABS (ALPRAZOLAM) as directed as needed  #60 x 5   Entered and Authorized by:   Neena Rhymes MD   Signed by:   Neena Rhymes MD on 01/06/2010   Method used:   Handwritten   RxID:   [id number] METFORMIN HCL 500 MG TB24 (METFORMIN HCL) Take 1 tablet by mouth two times a day  #180 Tablet x 2   Entered and Authorized by:   Neena Rhymes MD   Signed by:   Neena Rhymes MD on 01/05/2010   Method used:   Electronically to        [facility]  [address] [phone][facility] (retail)       [place], [place]  [address]       Ph: [phone] or [phone]       Fax: [phone]   RxID:   [id number] INDOMETHACIN 25 MG CAPS (INDOMETHACIN) 1 by mouth three times a day as needed gout flare  #30 x 5   Entered and Authorized by:   Neena Rhymes MD   Signed by:   Neena Rhymes MD on 01/05/2010   Method used:   [facility]ically to        [facility]  [phone] (retail)       [place], [place]  [address]       Ph: [phone] or [phone]       Fax: [phone]   RxID:   [id number]

## 2011-02-22 LAB — GLUCOSE, CAPILLARY
Glucose-Capillary: 114 mg/dL — ABNORMAL HIGH (ref 70–99)
Glucose-Capillary: 133 mg/dL — ABNORMAL HIGH (ref 70–99)
Glucose-Capillary: 134 mg/dL — ABNORMAL HIGH (ref 70–99)

## 2011-02-23 LAB — BASIC METABOLIC PANEL
BUN: 19 mg/dL (ref 6–23)
Calcium: 8.2 mg/dL — ABNORMAL LOW (ref 8.4–10.5)
GFR calc non Af Amer: 54 mL/min — ABNORMAL LOW (ref >60)
Glucose, Bld: 138 mg/dL — ABNORMAL HIGH (ref 70–99)
Sodium: 136 mEq/L (ref 135–145)

## 2011-02-23 LAB — GLUCOSE, CAPILLARY: Glucose-Capillary: 151 mg/dL — ABNORMAL HIGH (ref 70–99)

## 2011-02-24 LAB — POCT I-STAT, CHEM 8
BUN: 7 mg/dL (ref 6–23)
BUN: 8 mg/dL (ref 6–23)
Calcium, Ion: 1.23 mmol/L (ref 1.12–1.32)
Creatinine, Ser: 1.1 mg/dL (ref 0.4–1.5)
Creatinine, Ser: 1.3 mg/dL (ref 0.4–1.5)
Glucose, Bld: 133 mg/dL — ABNORMAL HIGH (ref 70–99)
Hemoglobin: 11.9 g/dL — ABNORMAL LOW (ref 13.0–17.0)
Potassium: 4.8 mEq/L (ref 3.5–5.1)
Sodium: 140 mEq/L (ref 135–145)
TCO2: 23 mmol/L (ref 0–100)

## 2011-02-24 LAB — POCT I-STAT 4, (NA,K, GLUC, HGB,HCT)
Glucose, Bld: 132 mg/dL — ABNORMAL HIGH (ref 70–99)
Glucose, Bld: 137 mg/dL — ABNORMAL HIGH (ref 70–99)
Glucose, Bld: 150 mg/dL — ABNORMAL HIGH (ref 70–99)
HCT: 33 % — ABNORMAL LOW (ref 39.0–52.0)
HCT: 34 % — ABNORMAL LOW (ref 39.0–52.0)
HCT: 37 % — ABNORMAL LOW (ref 39.0–52.0)
HCT: 40 % (ref 39.0–52.0)
Hemoglobin: 11.2 g/dL — ABNORMAL LOW (ref 13.0–17.0)
Hemoglobin: 11.6 g/dL — ABNORMAL LOW (ref 13.0–17.0)
Hemoglobin: 13.6 g/dL (ref 13.0–17.0)
Potassium: 3.8 mEq/L (ref 3.5–5.1)
Potassium: 5 mEq/L (ref 3.5–5.1)
Potassium: 6.2 mEq/L — ABNORMAL HIGH (ref 3.5–5.1)
Sodium: 133 mEq/L — ABNORMAL LOW (ref 135–145)
Sodium: 135 mEq/L (ref 135–145)
Sodium: 139 mEq/L (ref 135–145)
Sodium: 140 mEq/L (ref 135–145)

## 2011-02-24 LAB — GLUCOSE, CAPILLARY
Glucose-Capillary: 114 mg/dL — ABNORMAL HIGH (ref 70–99)
Glucose-Capillary: 116 mg/dL — ABNORMAL HIGH (ref 70–99)
Glucose-Capillary: 117 mg/dL — ABNORMAL HIGH (ref 70–99)
Glucose-Capillary: 119 mg/dL — ABNORMAL HIGH (ref 70–99)
Glucose-Capillary: 126 mg/dL — ABNORMAL HIGH (ref 70–99)
Glucose-Capillary: 128 mg/dL — ABNORMAL HIGH (ref 70–99)
Glucose-Capillary: 132 mg/dL — ABNORMAL HIGH (ref 70–99)
Glucose-Capillary: 135 mg/dL — ABNORMAL HIGH (ref 70–99)
Glucose-Capillary: 136 mg/dL — ABNORMAL HIGH (ref 70–99)
Glucose-Capillary: 139 mg/dL — ABNORMAL HIGH (ref 70–99)
Glucose-Capillary: 139 mg/dL — ABNORMAL HIGH (ref 70–99)
Glucose-Capillary: 149 mg/dL — ABNORMAL HIGH (ref 70–99)
Glucose-Capillary: 174 mg/dL — ABNORMAL HIGH (ref 70–99)
Glucose-Capillary: 82 mg/dL (ref 70–99)

## 2011-02-24 LAB — CBC
HCT: 29.8 % — ABNORMAL LOW (ref 39.0–52.0)
HCT: 32.6 % — ABNORMAL LOW (ref 39.0–52.0)
HCT: 34 % — ABNORMAL LOW (ref 39.0–52.0)
HCT: 37.8 % — ABNORMAL LOW (ref 39.0–52.0)
HCT: 42.1 % (ref 39.0–52.0)
Hemoglobin: 10.1 g/dL — ABNORMAL LOW (ref 13.0–17.0)
Hemoglobin: 11.1 g/dL — ABNORMAL LOW (ref 13.0–17.0)
Hemoglobin: 12.8 g/dL — ABNORMAL LOW (ref 13.0–17.0)
Hemoglobin: 14.5 g/dL (ref 13.0–17.0)
MCHC: 33.8 g/dL (ref 30.0–36.0)
MCHC: 33.9 g/dL (ref 30.0–36.0)
MCHC: 33.9 g/dL (ref 30.0–36.0)
MCHC: 33.9 g/dL (ref 30.0–36.0)
MCHC: 34.6 g/dL (ref 30.0–36.0)
MCV: 103.1 fL — ABNORMAL HIGH (ref 78.0–100.0)
MCV: 103.7 fL — ABNORMAL HIGH (ref 78.0–100.0)
MCV: 103.8 fL — ABNORMAL HIGH (ref 78.0–100.0)
MCV: 104 fL — ABNORMAL HIGH (ref 78.0–100.0)
Platelets: 109 10*3/uL — ABNORMAL LOW (ref 150–400)
Platelets: 118 10*3/uL — ABNORMAL LOW (ref 150–400)
Platelets: 128 10*3/uL — ABNORMAL LOW (ref 150–400)
Platelets: 82 10*3/uL — ABNORMAL LOW (ref 150–400)
RBC: 3.17 MIL/uL — ABNORMAL LOW (ref 4.22–5.81)
RBC: 3.64 MIL/uL — ABNORMAL LOW (ref 4.22–5.81)
RBC: 4.01 MIL/uL — ABNORMAL LOW (ref 4.22–5.81)
RBC: 4.07 MIL/uL — ABNORMAL LOW (ref 4.22–5.81)
RDW: 14.5 % (ref 11.5–15.5)
RDW: 14.7 % (ref 11.5–15.5)
RDW: 15.1 % (ref 11.5–15.5)
RDW: 15.2 % (ref 11.5–15.5)
WBC: 10.2 10*3/uL (ref 4.0–10.5)
WBC: 11.8 10*3/uL — ABNORMAL HIGH (ref 4.0–10.5)
WBC: 12 10*3/uL — ABNORMAL HIGH (ref 4.0–10.5)
WBC: 12.8 10*3/uL — ABNORMAL HIGH (ref 4.0–10.5)
WBC: 7.1 10*3/uL (ref 4.0–10.5)
WBC: 9.7 10*3/uL (ref 4.0–10.5)

## 2011-02-24 LAB — BASIC METABOLIC PANEL
BUN: 11 mg/dL (ref 6–23)
BUN: 13 mg/dL (ref 6–23)
CO2: 22 mEq/L (ref 19–32)
CO2: 31 mEq/L (ref 19–32)
Calcium: 7.7 mg/dL — ABNORMAL LOW (ref 8.4–10.5)
Calcium: 8.2 mg/dL — ABNORMAL LOW (ref 8.4–10.5)
Calcium: 8.4 mg/dL (ref 8.4–10.5)
Calcium: 8.7 mg/dL (ref 8.4–10.5)
Chloride: 104 mEq/L (ref 96–112)
Chloride: 106 mEq/L (ref 96–112)
Chloride: 98 mEq/L (ref 96–112)
Creatinine, Ser: 1.29 mg/dL (ref 0.4–1.5)
GFR calc Af Amer: >60 mL/min (ref >60)
GFR calc Af Amer: >60 mL/min (ref >60)
GFR calc non Af Amer: 49 mL/min — ABNORMAL LOW (ref >60)
GFR calc non Af Amer: 52 mL/min — ABNORMAL LOW (ref >60)
GFR calc non Af Amer: >60 mL/min (ref >60)
Glucose, Bld: 124 mg/dL — ABNORMAL HIGH (ref 70–99)
Glucose, Bld: 134 mg/dL — ABNORMAL HIGH (ref 70–99)
Glucose, Bld: 138 mg/dL — ABNORMAL HIGH (ref 70–99)
Potassium: 3.8 mEq/L (ref 3.5–5.1)
Potassium: 4.2 mEq/L (ref 3.5–5.1)
Potassium: 4.5 mEq/L (ref 3.5–5.1)
Potassium: 4.5 mEq/L (ref 3.5–5.1)
Sodium: 137 mEq/L (ref 135–145)
Sodium: 138 mEq/L (ref 135–145)
Sodium: 139 mEq/L (ref 135–145)
Sodium: 140 mEq/L (ref 135–145)

## 2011-02-24 LAB — POCT I-STAT 3, ART BLOOD GAS (G3+)
Acid-base deficit: 1 mmol/L (ref 0.0–2.0)
Bicarbonate: 21.1 mEq/L (ref 20.0–24.0)
Bicarbonate: 21.6 mEq/L (ref 20.0–24.0)
O2 Saturation: 91 %
O2 Saturation: 93 %
O2 Saturation: 96 %
Patient temperature: 30.5
Patient temperature: 35.4
Patient temperature: 35.4
TCO2: 22 mmol/L (ref 0–100)
TCO2: 23 mmol/L (ref 0–100)
TCO2: 23 mmol/L (ref 0–100)
TCO2: 24 mmol/L (ref 0–100)
pCO2 arterial: 39.3 mmHg (ref 35.0–45.0)
pCO2 arterial: 40.7 mmHg (ref 35.0–45.0)
pH, Arterial: 7.332 — ABNORMAL LOW (ref 7.350–7.450)
pH, Arterial: 7.366 (ref 7.350–7.450)
pH, Arterial: 7.432 (ref 7.350–7.450)
pH, Arterial: 7.494 — ABNORMAL HIGH (ref 7.350–7.450)
pO2, Arterial: 69 mmHg — ABNORMAL LOW (ref 80.0–100.0)
pO2, Arterial: 76 mmHg — ABNORMAL LOW (ref 80.0–100.0)

## 2011-02-24 LAB — POCT I-STAT 3, VENOUS BLOOD GAS (G3P V)
Acid-base deficit: 2 mmol/L (ref 0.0–2.0)
Bicarbonate: 22.5 mEq/L (ref 20.0–24.0)
O2 Saturation: 79 %
Patient temperature: 30.5
Patient temperature: 37
TCO2: 24 mmol/L (ref 0–100)
pCO2, Ven: 29.7 mmHg — ABNORMAL LOW (ref 45.0–50.0)
pH, Ven: 7.388 — ABNORMAL HIGH (ref 7.250–7.300)
pH, Ven: 7.469 — ABNORMAL HIGH (ref 7.250–7.300)

## 2011-02-24 LAB — TYPE AND SCREEN: ABO/RH(D): O POS

## 2011-02-24 LAB — COMPREHENSIVE METABOLIC PANEL
ALT: 27 U/L (ref 0–53)
AST: 25 U/L (ref 0–37)
Albumin: 4.2 g/dL (ref 3.5–5.2)
Calcium: 9.2 mg/dL (ref 8.4–10.5)
GFR calc Af Amer: >60 mL/min (ref >60)
Sodium: 140 mEq/L (ref 135–145)
Total Protein: 6.6 g/dL (ref 6.0–8.3)

## 2011-02-24 LAB — URINALYSIS, MICROSCOPIC ONLY
Bilirubin Urine: NEGATIVE
Hgb urine dipstick: NEGATIVE
Protein, ur: NEGATIVE mg/dL
Urobilinogen, UA: 0.2 mg/dL (ref 0.0–1.0)

## 2011-02-24 LAB — MRSA PCR SCREENING: MRSA by PCR: NEGATIVE

## 2011-02-24 LAB — ABO/RH: ABO/RH(D): O POS

## 2011-02-24 LAB — APTT: aPTT: 29 seconds (ref 24–37)

## 2011-02-24 LAB — LIPID PANEL
Cholesterol: 94 mg/dL (ref 0–200)
HDL: 32 mg/dL — ABNORMAL LOW (ref >39)
Total CHOL/HDL Ratio: 2.9 RATIO
VLDL: 22 mg/dL (ref 0–40)

## 2011-02-24 LAB — PROTIME-INR
INR: 1 (ref 0.00–1.49)
Prothrombin Time: 13.2 seconds (ref 11.6–15.2)

## 2011-02-24 LAB — CARDIAC PANEL(CRET KIN+CKTOT+MB+TROPI)
CK, MB: 2.1 ng/mL (ref 0.3–4.0)
CK, MB: 3 ng/mL (ref 0.3–4.0)
Relative Index: INVALID (ref 0.0–2.5)
Relative Index: INVALID (ref 0.0–2.5)
Total CK: 87 U/L (ref 7–232)
Total CK: 93 U/L (ref 7–232)
Troponin I: 0.02 ng/mL (ref 0.00–0.06)

## 2011-02-24 LAB — CREATININE, SERUM: GFR calc Af Amer: 58 mL/min — ABNORMAL LOW (ref >60)

## 2011-02-24 LAB — HEMOGLOBIN AND HEMATOCRIT, BLOOD
HCT: 32.8 % — ABNORMAL LOW (ref 39.0–52.0)
Hemoglobin: 11.1 g/dL — ABNORMAL LOW (ref 13.0–17.0)

## 2011-02-24 LAB — PLATELET COUNT: Platelets: 159 10*3/uL (ref 150–400)

## 2011-02-28 LAB — GLUCOSE, CAPILLARY: Glucose-Capillary: 105 mg/dL — ABNORMAL HIGH (ref 70–99)

## 2011-03-15 ENCOUNTER — Ambulatory Visit (INDEPENDENT_AMBULATORY_CARE_PROVIDER_SITE_OTHER): Payer: Medicare Other | Admitting: Internal Medicine

## 2011-03-15 ENCOUNTER — Encounter: Payer: Self-pay | Admitting: Internal Medicine

## 2011-03-15 ENCOUNTER — Other Ambulatory Visit (INDEPENDENT_AMBULATORY_CARE_PROVIDER_SITE_OTHER): Payer: Medicare Other

## 2011-03-15 DIAGNOSIS — E782 Mixed hyperlipidemia: Secondary | ICD-10-CM

## 2011-03-15 DIAGNOSIS — I2510 Atherosclerotic heart disease of native coronary artery without angina pectoris: Secondary | ICD-10-CM

## 2011-03-15 DIAGNOSIS — I10 Essential (primary) hypertension: Secondary | ICD-10-CM

## 2011-03-15 DIAGNOSIS — R5381 Other malaise: Secondary | ICD-10-CM

## 2011-03-15 DIAGNOSIS — Z0000 Encounter for general adult medical examination without abnormal findings: Secondary | ICD-10-CM

## 2011-03-15 DIAGNOSIS — M109 Gout, unspecified: Secondary | ICD-10-CM

## 2011-03-15 DIAGNOSIS — R5383 Other fatigue: Secondary | ICD-10-CM

## 2011-03-15 DIAGNOSIS — E119 Type 2 diabetes mellitus without complications: Secondary | ICD-10-CM

## 2011-03-15 LAB — COMPREHENSIVE METABOLIC PANEL
AST: 19 U/L (ref 0–37)
Alkaline Phosphatase: 69 U/L (ref 39–117)
BUN: 20 mg/dL (ref 6–23)
Calcium: 9 mg/dL (ref 8.4–10.5)
Creatinine, Ser: 1 mg/dL (ref 0.4–1.5)
Total Bilirubin: 0.8 mg/dL (ref 0.3–1.2)

## 2011-03-15 LAB — LIPID PANEL
Cholesterol: 107 mg/dL (ref 0–200)
HDL: 39.5 mg/dL (ref >39.00)
LDL Cholesterol: 39 mg/dL (ref 0–99)
Total CHOL/HDL Ratio: 3
Triglycerides: 141 mg/dL (ref 0.0–149.0)
VLDL: 28.2 mg/dL (ref 0.0–40.0)

## 2011-03-15 LAB — HEPATIC FUNCTION PANEL
ALT: 20 U/L (ref 0–53)
AST: 19 U/L (ref 0–37)
Alkaline Phosphatase: 69 U/L (ref 39–117)
Bilirubin, Direct: 0.1 mg/dL (ref 0.0–0.3)
Total Bilirubin: 0.8 mg/dL (ref 0.3–1.2)

## 2011-03-15 LAB — CBC WITH DIFFERENTIAL/PLATELET
Basophils Relative: 0.4 % (ref 0.0–3.0)
Eosinophils Relative: 1.9 % (ref 0.0–5.0)
Monocytes Relative: 9.5 % (ref 3.0–12.0)
Neutrophils Relative %: 59 % (ref 43.0–77.0)
Platelets: 168 10*3/uL (ref 150.0–400.0)
RBC: 3.92 Mil/uL — ABNORMAL LOW (ref 4.22–5.81)
WBC: 7.3 10*3/uL (ref 4.5–10.5)

## 2011-03-15 LAB — HEMOGLOBIN A1C: Hgb A1c MFr Bld: 6.8 % — ABNORMAL HIGH (ref 4.6–6.5)

## 2011-03-15 NOTE — Progress Notes (Signed)
Subjective:    Patient ID: ELION HOCKER, male    DOB: 08/13/1935, 76 y.o.   MRN: [id number]  [place] patient is here for annual Medicare wellness examination and management of other chronic and acute problems. Has questions about BP meds. He feels he has never recovered his strength.   The risk factors are reflected in the social history.  The roster of all physicians providing medical care to patient - is listed in the Snapshot section of the chart.  Activities of daily living:  The patient is 100% inedpendent in all ADLs: dressing, toileting, feeding as well as independent mobility  Home safety : The patient has smoke detectors in the home. They wear seatbelts. No firearms at home. There is no violence in the home.   There is no risks for hepatitis, STDs or HIV. There is no   history of blood transfusion. They have no travel history to infectious disease endemic areas of the world.  The patient has seen their dentist in the last six month. They have seen their eye doctor in the last year. They  admit to  hearing difficulty and have not had audiologic testing in the last year.  They do not  have excessive sun exposure. Discussed the need for sun protection: hats, long sleeves and use of sunscreen if there is significant sun exposure.   Diet: the importance of a healthy diet is discussed. They do have a healthy  diet.  The patient has a regular exercise program: walks, light weights , 30 min duration, 3 per week.  The benefits of regular aerobic exercise were discussed.  Depression screen: there are no signs or vegative symptoms of depression- irritability, change in appetite, anhedonia, sadness/tearfullness.  Cognitive assessment: the patient manages all their financial and personal affairs and is actively engaged. They could relate day,date,year and events; recalled 3/3 objects at 3 minutes; performed clock-face test normally.  The following portions of the patient's history were  reviewed and updated as appropriate: allergies, current medications, past family history, past medical history,  past surgical history, past social history  and problem list.  Vision, hearing, body mass index were assessed and reviewed.   During the course of the visit the patient was educated and counseled about appropriate screening and preventive services including : fall prevention , diabetes screening, nutrition counseling, colorectal cancer screening, and recommended immunizations.  Past Medical History  Diagnosis Date  . Cataract   . Hypertrophy of prostate without urinary obstruction and other lower urinary tract symptoms (LUTS)   . Hyperlipemia   . S/P coronary artery bypass graft x 3   . Coronary artery disease   . Fatigue   . Past use of tobacco   . Diverticulosis of colon     Adenomatous '2002  . History of colonic polyps   . Rectal bleeding   . Other and unspecified hyperlipidemia   . Hypogonadism male   . Gout    Past Surgical History  Procedure Date  . Tonsillectomy   . Vasectomy   . Umbilical hernia repair     2009  . Cataract extraction, bilateral   . Ptca   . Median sternotomy extracorporel circulation coronary   . Descending, saphenous vein graft to first obtuse marginal, sequential   . Saphenous vein graft to posterior descending and posterial lateral   . Endoscopic vein harvest right thigh    Family History  Problem Relation Age of Onset  . Cancer Mother   . Cancer Father  History   Social History  . Marital Status: Married    Spouse Name: N/A    Number of Children: N/A  . Years of Education: N/A   Occupational History  . Not on file.   Social History Main Topics  . Smoking status: Former Research scientist (life sciences)  . Smokeless tobacco: Not on file   Comment: Quit 1984  . Alcohol Use: No     Quit 1999  . Drug Use: Not on file  . Sexually Active: Not on file   Other Topics Concern  . 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  HENT:  Negative for hearing loss, ear pain, congestion, neck stiffness and postnasal drip.   Eyes: Negative for pain, discharge and visual disturbance.  Respiratory: Negative for chest tightness and wheezing.   Cardiovascular: Negative for chest pain and palpitations.       [No decreased exercise tolerance Gastrointestinal: [No change in bowel habit. No bloating or gas. No reflux or indigestion Genitourinary: Negative for urgency, frequency, flank pain and difficulty urinating.  Musculoskeletal: Negative for myalgias, back pain, arthralgias and gait problem.  Neurological: Negative for dizziness, tremors, weakness and headaches.  Hematological: Negative for adenopathy.  Psychiatric/Behavioral: Negative for behavioral problems and dysphoric mood.       Objective:   Physical Exam Constitutional: He is oriented to person, place, and time. He appears well-developed and well-nourished.       Healthy appearing white male in no acute distress  HENT:  Head: Normocephalic and atraumatic.  Right Ear: External ear normal.  Left Ear: External ear normal.  EACs with scant cerumen. TMs normal Nose: Nose normal.  Mouth/Throat: Oropharynx is clear and moist.  Eyes: Conjunctivae and EOM are normal. Pupils are equal, round, and reactive to light. Right eye exhibits no discharge. Left eye exhibits no discharge. No scleral icterus.  Neck: Normal range of motion. Neck supple. No JVD present. No tracheal deviation present. No thyromegaly present.  Cardiovascular: Normal rate, regular rhythm and normal heart sounds.  Exam reveals no gallop and no friction rub.   No murmur heard.       Quiet precordium. 2+ radial and DP pulses  Pulmonary/Chest: Effort normal. No respiratory distress. He has no wheezes. He has no rales. He exhibits no tenderness.       No chest wall deformity . Well healed sternotomy scar Abdominal: Soft. Bowel sounds are normal. He exhibits no distension. There is no tenderness. There is no rebound and no guarding.       No heptosplenomegaly  Musculoskeletal: Normal range of motion. He exhibits no edema and no tenderness.       Small and large joints without redness, synovial thickening or deformity. Full range of motion preserved about all small, median and large joints.  Lymphadenopathy:    He has no cervical adenopathy.  Neurological: He is alert and oriented to person, place, and time. He has normal reflexes. No cranial nerve deficit. Coordination normal.  Skin: Skin is warm and dry. No rash noted. No erythema.  Psychiatric: He has a normal mood and affect. His behavior is normal. Thought content normal. MMSE - oriented to day,date,year and content. Normal  clock face exercise. Recall 2/3 at 5 minutes       Assessment & Plan:  1. CAD - stable and doing well.  2. Lipids - for lab today with recommendations to follow  3. Obesity - BMI 33! Discussed weight management: smart food choices, portion size control, regular exercise. Target weight 180lbs. Goal - to loose one pound per month.   4. Hypertension - running SBP of 100+ but asymptomatic. Discussed additional benefit of beta-blockers and ACE -I drugs in DM and heart disease.  5. Diabetes - seems to do OK with metformin.   Plan - A1C with recommendations to follow.  6. Health Maintenance - interval history benign. Physical Exam OK except for weight. Current with colorectal cancer screening. Immunizations are up to date.  Discussed end of life issues.   In summary - a very nice man who appears medically stable but needs to loose weight.

## 2011-03-16 ENCOUNTER — Encounter: Payer: Self-pay | Admitting: Internal Medicine

## 2011-04-03 NOTE — Op Note (Signed)
[name], [name]              ACCOUNT NO.:  [id number]   MEDICAL RECORD NO.:  [id number]          PATIENT TYPE:  AMB   LOCATION:  DAY                          FACILITY:  [facility]   PHYSICIAN:  Odis Hollingshead, M.D.DATE OF BIRTH:  [birth year]   DATE OF PROCEDURE:  10/28/2007  DATE OF DISCHARGE:                               OPERATIVE REPORT   PREOPERATIVE DIAGNOSIS:  Umbilical hernia.   POSTOPERATIVE DIAGNOSIS:  Umbilical hernia.   PROCEDURE:  Umbilical hernia repair with mesh.   SURGEON:  Odis Hollingshead, M.D.   ANESTHESIA:  General, plus 0.5% plain Marcaine.   INDICATIONS:  Mr. Ricardo Washington is a 76 year old male who has had an umbilical  hernia for some time, now becoming symptomatic.  He now presents for  repair.  We discussed the procedure, risks, and aftercare  preoperatively.   TECHNIQUE:  He was seen in the holding area and brought to the operating  room, placed supine on the operating table, and given a general  anesthetic by way of LMA.  The hair in the periumbilical area was  clipped, and the area was sterilely prepped and draped.  Using Marcaine  solution, a periumbilical block was performed superficially and deep.  A  curvilinear incision was made inferior to the umbilicus through the skin  and subcutaneous tissue until the fascia was identified.  I then used  blunt dissection to encircle the umbilicus.  I then carefully dissected  the umbilicus free from the fascia, exposing the large 3 cm hernia  defect.  I dissected the subcutaneous tissue off the fascia 4 cm around  the primary defect.  I reduced some preperitoneal fat back into the  peritoneal cavity.   The primary defect was then closed with interrupted #1 Novofil sutures  left long.  A piece of polypropylene mesh was brought into the field.  The sutures were threaded through the mesh and tied down, anchoring the  mesh directly over the primary repair.  I then used a 0 Prolene to  anchor the periphery of  the mesh to the fascia to allow for an overlap  of 4 cm.  Excess mesh was trimmed.   Marcaine solution was then infiltrated into the fascia.  Hemostasis was  adequate.  The umbilicus was reimplanted onto the mesh with a 3-0 Vicryl  suture.  The subcutaneous tissue was closed over the mesh with a running  3-0 Vicryl suture.  The skin was then closed with 4-0 Monocryl  subcuticular stitch.  Steri-Strips and a sterile dressing were applied.   He tolerated the procedure well, without any apparent complications, and  was taken to the recovery room in satisfactory condition.      Odis Hollingshead, M.D.  Electronically Signed     TJR/MEDQ  D:  10/28/2007  T:  10/28/2007  Job:  [id number]   cc:   Heinz Knuckles. Norins, MD  [address]  [place] [address]

## 2011-04-03 NOTE — Consult Note (Signed)
NAMEOSVALDO, LAMPING              ACCOUNT NO.:  [id number]   MEDICAL RECORD NO.:  [id number]          PATIENT TYPE:  OBV   LOCATION:  3707                         FACILITY:  [facility]   PHYSICIAN:  Revonda Standard. Roxan Hockey, M.D.DATE OF BIRTH:  1935/08/23   DATE OF CONSULTATION:  07/13/2009  DATE OF DISCHARGE:                                 CONSULTATION   REASON FOR CONSULTATION:  Left main, three-vessel disease.   HISTORY OF PRESENT ILLNESS:  Mr. Heigl is a 75 year old gentleman with  no prior history of coronary disease who presented to Dr. Veverly Fells' office  yesterday with a complaint of left-sided chest pain with exertion.  He  states that over the past 3-4 months, he had been feeling more fatigued  and tired than normal.  Approximately 2-3 weeks ago, he began having  left-sided chest pain with heavy exertion such as mowing his lawn.  He  described this as a heavy aching sensation that is relieved with rest.  He does not have any rest or nocturnal symptoms.  Yesterday, he was  doing yard work and while using a leaf blower had recurrence of his  chest pain.  It lasted approximately 5 minutes.  He told his wife about  it and she insisted to see Dr. Linda Hedges.  Dr. Linda Hedges evaluated the patient  and had him admitted to the hospital.  He ruled out for myocardial  infarction, but given his risk factors and classic symptoms, he  underwent cardiac catheterization today where he was found to have 90%  hazy left main stenosis as well as significant disease in the proximal  right coronary and posterior lateral branch.  He did have a normal left  ventricle with an ejection fraction of 65%.   PAST MEDICAL HISTORY:  Significant for:  1. Hypertension.  2. Gout.  3. Non-insulin-dependent diabetes since 2000.  4. Hypercholesterolemia.  5. Diverticulosis.  6. Hypogonadism.   PAST SURGICAL HISTORY:  Significant for tonsillectomy, vasectomy, right  cataract, umbilical hernia, and I and D of  furuncle.   CURRENT MEDICATIONS:  1. Lisinopril 20 mg daily.  2. Crestor 5 mg daily.  3. Aspirin 81 mg daily.  4. Avodart 0.5 mg daily.  5. Flomax 0.4 mg daily.  6. Metformin 500 mg daily.  7. Indocin 25 mg daily p.r.n.  8. Lysine 500 mg b.i.d. p.r.n.  9. Alprazolam 0.25 mg daily p.r.n.  10.Metamucil 1 tablespoon b.i.d.  11.Multivitamin one daily.   ALLERGIES:  He has an allergy to SULFA medications.   FAMILY HISTORY:  Significant for father and mother dying in their late  10s and early 56s, both of cancer.   SOCIAL HISTORY:  He is retired.  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  He is married.  Lives with his wife.  He has 40-pack-a-year history of  tobacco abuse and quit in 1984.   REVIEW OF SYSTEMS:  The patient complains chest pain, shortness of  breath as noted, sweats, urinary frequency, and some nausea as well as  generalized fatigue.  All other systems are negative.   PHYSICAL EXAMINATION:  GENERAL:  Mr. Mittelstaedt is  a 76 year old gentleman  in no acute distress.  He is a well-developed, well-nourished.  VITAL SIGNS:  Blood pressures is 130/76, pulse is 80 and regular,  respirations 16.  NEUROLOGIC:  He is alert and oriented x3 and grossly intact.  HEENT:  Unremarkable.  NECK:  Supple without thyromegaly, adenopathy, or bruits.  CARDIAC:  Regular rate and rhythm.  Normal S1 and S2.  No murmurs, rubs,  or gallops.  ABDOMEN:  Soft and nontender.  EXTREMITIES:  Without clubbing, cyanosis, or edema.  He has 2+ dorsalis  pedis and posterior tibial pulses bilaterally.   LABORATORY DATA:  EKG showed normal sinus rhythm.  It was done at a time  when he was not having chest pain.  Portable chest x-ray showed small  left pleural effusion.  His peak CK was 93 with an MB of peak of 3.0.  Peak troponin was 0.04.  His cholesterol was 94, LDL 40, HDL 32,  triglycerides 109, hematocrit 41, white count 5.8, platelets 189.  Sodium 140, potassium 4.6, BUN 12, creatinine 1.25,  glucose is 149.  PT  13.2, INR 1.0.  LFTs within normal limits.  Albumin 4.2.   IMPRESSION:  Mr. Merrick is a 76 year old gentleman with multiple  cardiac risk factors who presents with a 2 to 3-week history of  exertional chest pain.  He denies any rest or nocturnal symptoms.  He  did rule out for myocardial infarction, but at catheterization he has  90% hazy main stenosis as well as three-vessel disease with preserved  left ventricular function.   Coronary artery bypass grafting is indicated for survival benefit as  well as symptoms.  I have discussed with the patient's wife the  indications, risks, benefits, and alternative treatments.  They  understand the risks that include but are not limited to death, stroke,  MI, DVT, PE, bleeding, possible need for transfusions, infection as well  as other organ system dysfunction including respiratory, renal, or GI  complications.  He understands and accepts these risks and agrees to  proceed.  He is scheduled for tomorrow in the OR second case, which is  first available OR time.      Revonda Standard Roxan Hockey, M.D.  Electronically Signed     SCH/MEDQ  D:  07/13/2009  T:  07/14/2009  Job:  [id number]   cc:   Wallis Bamberg. Johnsie Cancel, MD, [facility]  Heinz Knuckles. Norins, MD

## 2011-04-03 NOTE — Cardiovascular Report (Signed)
NAMELEDON, WEIHE              ACCOUNT NO.:  [id number]   MEDICAL RECORD NO.:  [id number]          PATIENT TYPE:  INP   LOCATION:  2907                         FACILITY:  [facility]   PHYSICIAN:  Wallis Bamberg. Johnsie Cancel, MD, FACCDATE OF BIRTH:  1934-12-04   DATE OF PROCEDURE:  DATE OF DISCHARGE:                            CARDIAC CATHETERIZATION   A 76 year old patient admitted to the hospital with unstable angina.  Cine-catheterization was done with 5-French catheters from right femoral  artery.  At the end of the case, sheath injection showed [place] to be in  good position for Angio-Seal.  Device was delivered with good  hemostasis.   We used a standard JL4 catheter to engage the left main.  It became  obvious by fluoroscopy that the LAD and left main were extensively  calcified with engagement.  There was marked damping of the left main  with a 60 mmHg drop in pressure.   Limited angiography in 3 views were obtained to assess the distal  vessels.  The patient had a greater than 90% left main stenosis with  some haziness.  The LAD proper had 20-30% multiple discrete in proximal,  mid, and distal vessel.  The first diagonal branch had 20-30% multiple  discrete lesions.   Circumflex coronary artery had 20% multiple discrete lesions.  There was  1 large first obtuse marginal branch without critical disease.   The right coronary artery was dominant.  There was 40-50% proximal  disease, 20-30% midvessel disease.  The posterolateral branch although  small had an 80% ostial lesion.   RAO ventriculography:  RAO ventriculography was normal.  EF was 60%.  There was no gradient across the aortic valve and no MR.  Aortic  pressure is 121/66, LV pressure was 120/8.   IMPRESSION:  The patient has critical left main disease.  Although there  was significant catheter damping on the table, he did not have chest  pain.  We had good hemostasis with Angio-Seal.  I will start him on a  heparin drip as soon  as tolerable.  Cardiovascular-Thoracic Surgery has  been called.  Hopefully, the patient can proceed with surgery tomorrow  morning.      Wallis Bamberg. Johnsie Cancel, MD, [facility]  Electronically Signed     PCN/MEDQ  D:  07/13/2009  T:  07/14/2009  Job:  [id number]   cc:   Heinz Knuckles. Norins, MD

## 2011-04-03 NOTE — Consult Note (Signed)
NAMEGILAD, Ricardo Washington              ACCOUNT NO.:  [id number]   MEDICAL RECORD NO.:  [id number]          PATIENT TYPE:  INP   LOCATION:  3707                         FACILITY:  [facility]   PHYSICIAN:  Blima Singer, MD   DATE OF BIRTH:  18-Jan-1935   DATE OF CONSULTATION:  07/12/2009  DATE OF DISCHARGE:                                 CONSULTATION   CHIEF COMPLAINT:  Unstable angina.   HISTORY OF PRESENT ILLNESS:  The patient is a 76 year old Caucasian male  without any prior history of coronary artery disease though does have  several risk factors including history of hypertension, diabetes,  hyperlipidemia who currently presents with stuttering chest pain.  The  patient states that his chest pain initially started 2-4 weeks ago where  he had had intermittent pain with activity.  Describes it as a dull  aching across his chest.  Today while he was mowing his lawn and using a  blower, the patient started having severe chest pain that required him  to go inside and rest.  The pain was relieved after 10-15 minutes.  It  was associated with some diaphoresis.  The patient stated the past few  weeks he also noted some increased fatigue.  He subsequently called his  primary care doctor and went to Urgent Care.  EKG there showed normal  sinus rhythm with no ST or T-wave changes.  The patient is currently  chest pain free.   He denies any lower extremity edema, PND, orthopnea.  He did state that  he had some mild shortness of breath and some diaphoresis.   PAST MEDICAL HISTORY:  1. Hypertension times 2 years.  2. Gout.  3. Hypogonadism.  4. Type 2 diabetes diagnosed 2000.  5. Hyperlipidemia.  6. Diverticulosis.   CURRENT MEDICATIONS:  1. Lisinopril 20 mg daily.  2. Flomax 0.4 mg daily.  3. Aspirin 325 mg daily.  4. Lopressor 25 mg b.i.d.  5. Alprazolam 0.25.  6. Crestor.   SOCIAL HISTORY:  Currently lives in [place] with wife.  He is retired  Pharmacologist.  He smoked 40 pack years  and quit in 1984.  Denies any  alcohol.   FAMILY HISTORY:  Mother died of cancer.  Father died of cancer as well  age 47.  Brother died of an MVA and sister died of LVH.   REVIEW OF SYSTEMS:  Endorses diaphoresis with a chest pain.  As well as  some shortness of breath.  He was also noted some mild nausea as well.   PHYSICAL EXAM:  Temperature of 97.8, pulse 75 history of 16, blood  pressure 143/73, saturating 97% on room air.  HEENT:  Pupils equal, round and reactive to light.  Extraocular motion  intact.  Oropharynx is clear.  Mucous membranes are moist.  NECK:  Supple.  No bruit.  No carotid bruits heard.  HEART:  S1 and S2 are normal.  No S3 or S4.  LUNGS:  Clear to auscultation bilaterally.  No crackles or wheezes are  heard.  SKIN:  No significant rashes were noted.  ABDOMEN:  Soft, nontender, mildly obese.  EXTREMITIES:  No clubbing, cyanosis or edema is noted.  The patient has  2+ posterior tibial pulses bilaterally.  NEUROLOGY:  Alert and oriented x3 with cranial nerves II-XII are intact.  Strength is 5/5 upper and lower extremities bilaterally.   EKG shows normal sinus rhythm with no ST or T-wave changes.  Labs  currently pending.   ASSESSMENT AND PLAN:  1. Unstable angina.  The patient gives a classic story and has      multiple risk factors including diabetes, hyperlipidemia, and      hypertension as well as a history of smoking.  It would be      reasonable to go straight to cardiac catheterization.  I had a      discussion about this with the patient and he wants to think this      over.  Should the patient decide not to undergo corrective      catheterization it is reasonable to perform a stress test as well.      The patient can also be treated with medications including beta      blockers and some nitroglycerin and see if that would help to      resolve some of his angina.  At the moment I will make the patient      n.p.o. and plan for continued cardiac enzymes.   The patient may      have a stress test.  Ideally a functional stress test should he      decide not to go forward with catheterization.  In terms of stress      test it may be either a stress echocardiogram or a stress Myoview.      Blima Singer, MD  Electronically Signed     SS/MEDQ  D:  07/12/2009  T:  07/12/2009  Job:  [id number]

## 2011-04-03 NOTE — Letter (Signed)
August 27, 2007    Odis Hollingshead, M.D.  [address], [place], [place] [address]   RE:  JSEAN, TAUSSIG  MRN:  [id number]  /  DOB:  [birth year]   Dear Sherren Mocha:   Thank you very much for seeing Mr. Tramon Crescenzo, a delightful 76-year-  old gentleman, for an umbilical hernia.   The patient has had a developing umbilical hernia over the past several  years.  He now is having increased irritation and mild tenderness.  On  presentation to the office today, the hernia was larger than I recall,  it was without erythema or open wounds.  I was able to easily reduce the  hernia, although he reports it did cause him some mild discomfort.   Please find attached my complete H&P from June 11, 2007, detailing the  patient's current medications, past medical history, and my full exam.   If I can provide any additional information in regards to this patient,  please do not hesitate to contact me.   I appreciate your assistance in the care of this nice man and look  forward to hearing from you.  I remain yours truly,    Sincerely,      Heinz Knuckles. Norins, MD  Electronically Signed   MEN/MedQ  DD: 08/27/2007  DT: 08/27/2007  Job #: [id number]

## 2011-04-03 NOTE — Assessment & Plan Note (Signed)
[facility]                           PRIMARY CARE OFFICE NOTE   NAME:Ricardo Washington, Ricardo Washington                     MRN:          [id number]  DATE:06/10/2007                            DOB:          11-19-1935    This is a 76 year old Caucasian gentleman followed for hypertension,  gout, hypogonadism, diabetes who presents for followup evaluation and  exam. He was last seen March 05, 2007 for ongoing problem with pain in  the left lower quadrant of his abdomen. I was concerned with this being  of GU origin and referred the patient to urology. He has been seen and  has been treated for recurrent prostatitis with prolonged course of  ciprofloxacin and does seem to be doing better.   Patient is on testosterone replacement for hypogonadism. He is  interested in taking a drug holiday. We discussed the overall advantages  of adequate testosterone levels in men as they age. He reports he is not  sexually active at this time, and so we will consider a drug holiday.   Lipids:  The patient is getting excellent results with Crestor, and I  have encouraged him to continue the same.   PAST MEDICAL HISTORY:  Surgery:  1. Tonsillectomy as a child.  2. Vasectomy.  3. I&D of a furuncle.  4. The patient has had a cataract surgery of right eye.   MEDICAL ILLNESSES:  1. Usual childhood diseases.  2. Noninsulin-dependent diabetes.  3. Gout.  4. Hypogonadism.  5. Chronic prostatitis.  6. History of diverticulitis.   CURRENT MEDICATIONS:  1. Metamucil every morning.  2. Aspirin 81 mg daily.  3. Metformin 500 mg b.i.d.  4. Avodart 0.5 mg daily.  5. Testosterone 200 mg IM monthly.  6. Lisinopril 20 mg daily.  7. Crestor 5 mg daily.  8. Flomax 0.4 mg daily.  9. Indomethacin p.r.n.  10.Xanax 0.5 mg p.r.n.   FAMILY HISTORY:  Noncontributory.   SOCIAL HISTORY:  The patient is married. He lives with his wife. He is  retired. He does have some physical activity, but  during the summer  months, [facility] of [facility] number of children [facility]  __________.   CHART REVIEW:  Last colonoscopy January 16, 2005 with followup  recommended in 7 years.   Last stress Myoview study November 17, 2004 with an EF of 67%, no  evidence of any ischemia.   Lower extremity arterial Dopplers performed November 17, 2004 with ABIs  of 1.1 bilaterally.   IMMUNIZATIONS:  Last tetanus booster in 1995. Pneumonia vaccine given in  November of 2001.   REVIEW OF SYSTEMS:  The patient has had no fevers, sweats, chills or  weight changes. Ophthalmologic stable. No ENT, cardiovascular,  respiratory or GI complaints. GU as above. No musculoskeletal changes or  problems.   EXAMINATION:  Temperature was 96.6, blood pressure 141/76, pulse 71,  weight 211.  GENERAL APPEARANCE:  This is an overweight Caucasian male in no acute  distress.  HEENT:  Normocephalic, atraumatic. EACs and TMs were normal. Oropharynx  with native dentition in good repair. No  buccal or palatal lesions were  noted. Posterior pharynx was clear. Conjunctivae and sclerae was clear.  Pupils are equal, round, and reactive. Further exam deferred to  ophthalmology.  NECK:  Supple without thyromegaly.  NODES:  No adenopathy was noted in the cervical, supraclavicular or  inguinal regions.  CHEST:  No CVA tenderness.  LUNGS:  Were clear to auscultation and percussion.  CARDIOVASCULAR:  2+ radial pulses. No JVD or carotid bruits. He had a  flat precordium with regular rate and rhythm without murmurs, rubs, or  gallops.  ABDOMEN:  Soft. No guarding. No rebound. He is obese. He has an  umbilical hernia that was easily reducible. There was a small nodule in  the left lower quadrant in the subcutaneous tissue. There was no  significant tenderness to deep palpation.  GENITALIA/RECTAL:  Deferred to urology.  EXTREMITIES:  Without clubbing, cyanosis, or edema. No deformities were  noted.   Laboratory  orders are pending and include L5B, basic metabolic panel,  AST, AFT, lipid profile, testosterone level. This patient did have a 12-  lead electrocardiogram which revealed sinus rhythm with no  abnormalities.   ASSESSMENT AND PLAN:  1. Hypertension. The patient's blood pressure is adequately      controlled. At this time, we will have him continue his present      medications.  2. Diabetes. The patient's last hemoglobin A1c was 6.6% January 2008.      Followup laboratory is ordered and pending. We will adjust his      medications as indicated.  3. Gout. Stable.  4. Hypogonadism. At this point, we will give the patient a drug      holiday from testosterone. Will monitor carefully his sense of      wellbeing, muscle mass and strength.  5. Genitourinary. The patient is currently being treated for BPH as      well as possible prostatitis. He will follow up with his urologist      as instructed.  6. Health maintenance. The patient is current and up to date with      colorectal cancer screening and prostate cancer screening. The      patient's immunizations were up to date.   In summary, this is a very pleasant gentleman who is mildly overweight  and medically stable. I have asked him to try to consider weight loss  program. He is asked to return to see me in four months or on a p.r.n.  basis.     Heinz Knuckles Norins, MD  Electronically Signed    MEN/MedQ  DD: 06/11/2007  DT: 06/11/2007  Job #: [id number]   cc:   Idalia Needle

## 2011-04-03 NOTE — Op Note (Signed)
Ricardo Washington, MARKELL              ACCOUNT NO.:  [id number]   MEDICAL RECORD NO.:  [id number]          PATIENT TYPE:  INP   LOCATION:  2314                         FACILITY:  [facility]   PHYSICIAN:  Revonda Standard. Roxan Hockey, M.D.DATE OF BIRTH:  27-Jan-1935   DATE OF PROCEDURE:  07/14/2009  DATE OF DISCHARGE:                               OPERATIVE REPORT   PREOPERATIVE DIAGNOSIS:  Left main and three-vessel coronary artery  disease.   POSTOPERATIVE DIAGNOSIS:  Left main and three-vessel coronary artery  disease.   PROCEDURES:  Median sternotomy extracorporeal circulation coronary  artery bypass grafting x4 (left internal mammary artery to left anterior  descending, saphenous vein graft to first obtuse marginal, sequential  saphenous vein graft to posterior descending and posterior lateral),  endoscopic vein harvest right thigh.   SURGEON:  Revonda Standard. Roxan Hockey, MD   ASSISTANT:  Suzzanne Cloud, PA   ANESTHESIA:  General.   FINDINGS:  LAD diffusely diseased but with adequate lumen, remaining  targets good-quality, good-quality mammary artery, good-quality  saphenous veins.   CLINICAL NOTE:  Mr. Valdes is a 76 year old gentleman, who experienced  severe chest pain while working in his yard on July 12, 2009.  He went  to Dr. Adella Hare office and was admitted to [facility].  He underwent cardiac catheterization the following day, which showed 90%  left main stenosis which was hazy consistent with possible thrombus.  He  had diffusely diseased right coronary.  He had preserved left  ventricular function.  He was advised to undergo coronary artery bypass  grafting.  Indications, risks, benefits, and alternatives were discussed  in detail with the patient.  He understood and accepted the risks and  agreed to proceed.   OPERATIVE NOTE:  Mr. Ponzo was brought to the preop holding area on  July 14, 2009.  There lines were placed by the anesthesia service for  monitoring  arterial and pulmonary arterial pressures.  Intravenous  antibiotics were administered.  The patient was taken to the operating  room, anesthetized and intubated.  A Foley catheter was placed.  Of  note, the patient had hematuria in preoperative holding, the Foley  catheter was placed without difficulty.  The chest, abdomen, and legs  were prepped and draped in the usual fashion.   An incision was made in the medial aspect of the right leg at the level  of knee, the greater saphenous vein was harvested endoscopically.  A  2000 units of heparin was administered during the vessel harvest.  Saphenous vein was of good quality.  Simultaneously, a median sternotomy  was performed and the left internal mammary artery was harvested under  direct vision.  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  The  mammary artery was a good-quality vessel as well.   The remainder of the full heparin dose was administered after the  conduits had been harvested.  The pericardium was opened.  The ascending  aorta was inspected.  There was no significant atherosclerotic disease.  The aorta was cannulated via concentric 2-0 Ethibond pledgeted  pursestring sutures.  A dual stage venous cannula was placed  via  pursestring suture in the right atrial appendage.  Cardiopulmonary  bypass was instituted.  The patient was cooled to 32 degrees Celsius.  The coronary arteries were inspected.  The anastomotic sites were  chosen.  The conduits were inspected and cut to length.  A foam pad was  placed in the pericardium to insulate the heart and protect the left  phrenic nerve.  A temperature probe was placed in myocardial septum and  a cardioplegia cannula was placed in the ascending aorta.   The aorta was crossclamped.  The left ventricle was emptied via aortic  root vent.  Cardiac arrest was achieved in combination of cold antegrade  blood cardioplegia and topical iced saline.  After achieving a complete  diastolic  arrest and adequate myocardial septal cooling, the following  distal anastomoses were performed.   First, a reverse saphenous vein graft was placed end-to-side to obtuse  marginal-1, this was a branch of the left circumflex.  It was the only  significant branch from that vessel.  There was a 1.5-mm good-quality  target site anastomosis, the vein graft was good quality, it was  anastomosed end-to-side with running 7-0 Prolene suture.  All  anastomoses were probed proximally and distally at their completion to  ensure patency.  Cardioplegia was administered at the completion of each  vein graft to assess flow and hemostasis.   Next, a reverse saphenous vein graft was placed sequentially to the  posterior descending and posterior lateral branch of the right coronary.  These were both 1.5-mm good-quality vessels.  The saphenous vein was of  good quality.  A side-to-side anastomosis was performed to the posterior  descending and end-to-side to the posterior lateral.  Both were done  with 7-0 Prolene sutures.  There was excellent flow through the graft.  Cardioplegia was administered.  There was good hemostasis.   Next, the left internal mammary artery was brought through a window in  the pericardium.  The distal end was beveled.  It was anastomosed end-to-  side to the distal LAD.  Again, this vessel had diffuse plaquing and  some calcification, but did accept a 1.5-mm probe both proximally and  distally from the site of anastomosis.  The mammary to LAD anastomosis  was performed with running 8-0 Prolene suture.  At the completion of  anastomosis, the bulldog clamps were briefly removed.  Immediate rapid  septal rewarming was noted.  The bulldog clamp was replaced.  The  mammary pedicle was tacked to the epicardial surface of the heart with 6-  0 Prolene sutures.   Additional cardioplegia was administered.  The vein grafts were cut to  length.  The cardioplegic cannulas were removed from  the ascending  aorta.  Proximal vein graft anastomoses were performed to 4.5 mm punch  aortotomies with running 6-0 Prolene sutures.  At the completion of  final proximal anastomosis, the patient was placed in Trendelenburg  position.  Lidocaine was administered.  The aortic root was de-aired and  the aortic crossclamp was removed.  The total crossclamp time was 64  minutes.   While rewarming was completed, all proximal and distal anastomoses were  inspected for hemostasis.  Epicardial pacing wires were placed on the  right ventricle and right atrium.  The patient was in sinus bradycardia.  He had required a single defibrillation after removal of crossclamp.  Atrial pacing was initiated at 90 beats per minute.  A low-dose dopamine  infusion was initiated at 3 mcg/kg/minute.  The patient  then was weaned  from cardiopulmonary bypass on the first attempt without difficulty.  The total bypass time was 95 minutes.  Initial cardiac index was greater  than 2 liters/minute/meter squared.  The patient remained  hemodynamically stable throughout the post bypass period.   Test dose protamine was administered and it was well tolerated.  The  atrial and aortic cannulae were removed.  Remainder of the protamine was  administered.  The chest was irrigated with 1 liter of warm normal  saline containing 1 g of vancomycin.  Hemostasis was achieved.  The left  pleural and single mediastinal chest tubes were placed through separate  subcostal incisions.  The pericardium was reapproximated over the aortic  root with interrupted 3-0 silk sutures.  The sternum was closed with  combination of single and double heavy gauge stainless  steel wires.  The pectoralis fascia, subcutaneous tissue, and skin were  closed in standard fashion.  All sponge, needle, and instrument counts  were correct at the end of procedure.  There were no intraoperative  complications.  The patient was transported from the operating room  to  the surgical intensive care unit in good condition.      Revonda Standard Roxan Hockey, M.D.  Electronically Signed     SCH/MEDQ  D:  07/14/2009  T:  07/15/2009  Job:  [id number]   cc:   Wallis Bamberg. Johnsie Cancel, MD, [facility]  Heinz Knuckles. Norins, MD

## 2011-04-03 NOTE — Assessment & Plan Note (Signed)
OFFICE VISIT   Gains, Jigar E  DOB:  [birth year]                                        August 08, 2009  CHART #:  [id number]   The patient is a 76 year old gentleman who had coronary bypass grafting  x4 on August 26 for left main and three-vessel disease.  His LAD was  diffusely diseased but did have an adequate lumen, the remaining targets  were good quality vessels.  Postoperatively he did well.  He did have a  transient elevation in his creatinine which resolved, he do not have any  other significant complications.  He says that since he has been home,  he has been doing well, his appetite is not good, he has had some  trouble with his sleep and he has been walking but certainly not on a  regular basis.  He is not having incisional discomfort but is having  some back pain for which he has been using the Percocet, but he is using  no more than once or twice a day.  Overall, he feels that over the past  week or so he started to feel a little stronger and feel a little  better.   PHYSICAL EXAMINATION:  General:  The patient is a 76 year old gentleman  in no acute distress.  Vital Signs:  His blood pressure is 100/69, pulse  84, respirations 18, his oxygen saturation 90% on room air.  Neurologic:  He is alert and oriented x3 with no focal deficits.  Lungs:  Clear with  equal breath sounds bilaterally.  Cardiac:  Regular rate and rhythm.  Normal S1 and S2.  No rubs, or murmurs.  Chest:  Sternal incision is  clean, dry, and intact.  Sternum is stable.  Extremities:  His leg  incisions are healing well.  There is minimal seroma along the venectomy  harvest tract and the incisions are clean, dry, and intact.  There is no  peripheral edema.   Chest x-ray shows good aeration of the lungs bilaterally.   CURRENT MEDICATIONS:  Aspirin 325 mg daily, lisinopril 10 mg daily,  metoprolol 25 mg b.i.d., Crestor 10 mg daily, Xanax 0.25 mg p.r.n.,  Avodart 0.5 mg  daily, Flomax 0.4 mg daily, l-lysine 1000 mg daily  p.r.n., metformin 500 mg daily and a multivitamin.   IMPRESSION:  The patient is doing well at this point in time.  He should  over the next 2-3-week see an improvement in his energy level and his  exercise tolerance.  He is going to start cardiac rehab next week.  He  sees Dr. Johnsie Cancel tomorrow.  He had some questions about whether or not he  could discontinue some of his medications, but he will discuss that with  Dr. Johnsie Cancel.  I am not sure if he needs to be on both, Avodart and Flomax  but most of the remaining medications are important from a cardiac  standpoint as well as his diabetes management.  He may begin driving a  car, appropriate precautions were discussed.  He is not to lift any  objects weighing greater than 10 pounds for at least another 3 weeks and  he may begin starting to hit some golf balls in a month.   Revonda Standard Roxan Hockey, M.D.  Electronically Signed   SCH/MEDQ  D:  08/08/2009  T:  08/09/2009  Job:  [id number]   cc:   Heinz Knuckles. Norins, MD

## 2011-04-03 NOTE — Discharge Summary (Signed)
NAMEJAMEIRE, Ricardo Washington              ACCOUNT NO.:  [id number]   MEDICAL RECORD NO.:  [id number]          PATIENT TYPE:  INP   LOCATION:  2018                         FACILITY:  [facility]   PHYSICIAN:  Revonda Standard. Roxan Hockey, M.D.DATE OF BIRTH:  10-10-1935   DATE OF ADMISSION:  07/12/2009  DATE OF DISCHARGE:                               DISCHARGE SUMMARY   HISTORY:  The patient is 76 year old white male without any history of  prior coronary artery disease, although notable for significant risk  factors including hypertension, diabetes, hyperlipidemia, former smoker,  who presented with chest pain.  The patient stated that the pain  initially began 2-4 weeks ago and had been intermittent with activity.  He described it as a dull aching across his chest.  On the day of  admission, he was mowing his lawn and using a blower and he began to  have severe chest pain that required him to go inside and rest.  The  pain was relieved after approximately 10-15 minutes.  It was associated  with diaphoresis.  The patient also notes over the past several weeks he  has had increasing fatigue.  He called his primary care doctor and  presented to urgent care where the EKG showed normal sinus rhythm with  no ST or T-wave changes.  He was pain free; however, due to the unstable  nature of his chest pain symptoms and multiple risk factors he was felt  to require admission for further evaluation and treatment to include  possible cardiac catheterization.   PAST MEDICAL HISTORY:  1. Hypertension.  2. History of gout.  3. History of hypogonadism.  4. Type 2 diabetes mellitus.  5. Hyperlipidemia.  6. Diverticulosis.  7. History of tobacco abuse.   Medications prior to admission included the following:  1. Lysine 500 mg b.i.d. p.r.n.  2. Indomethacin and 25 mg daily p.r.n.  3. Alprazolam 0.25 mg daily p.r.n.  4. Metamucil 1 tablespoon b.i.d.  5. Lisinopril 20 mg daily.  6. Crestor 5 mg daily.  7. Aspirin  81 mg daily.  8. Avalide 0.5 mg daily.  9. Multivitamin once daily.  10.Metformin 500 mg daily.  11.Flomax 0.4 mg daily.   SOCIAL HISTORY:  He lives in [place] with his wife.  He is retired.  He  smoked 40 pack years but quit in 1984.  No alcohol use.   FAMILY HISTORY:  Mother deceased of cancer.  Father deceased of cancer.   REVIEW OF SYMPTOMS AND PHYSICAL EXAMINATION:  Please see history and  physical done at the time of admission.   HOSPITAL COURSE:  The patient was admitted.  He was stabilized medically  with low-molecular-weight heparin, beta-blockers, aspirin, and  continuation of his ACE inhibitors.  His metformin was held, and he was  scheduled for cardiac catheterization.  This was done on July 13, 2009, by Dr. Johnsie Cancel.  This showed severe coronary artery disease  including a 90% left main lesion, a 20-30% LAD lesion, a 40-50% proximal  right coronary and 80% posterolateral coronary lesion.  He was started  on heparin and cardiac surgery consultation was  obtained with Modesto Charon, MD.  He evaluated the patient as well as his studies and  agreed with recommendations to proceed with surgical revascularization.  The patient had an episode of gross hematuria preoperatively, but due to  the severity of his coronary disease it was felt that he would best be  served by proceeding with the surgery, and on July 14, 2009, he was  taken to the operating room where he underwent the following procedure  coronary artery bypass grafting x4.  The following grafts were placed:  1. A saphenous vein graft to the obtuse marginal #1.  2. A sequential saphenous vein graft to the posterior descending and      posterolateral coronary arteries.  3. Left internal mammary artery to the LAD.   The patient tolerated the procedure well, was taken to the surgical  intensive care unit in stable condition.   POSTOPERATIVE HOSPITAL COURSE:  The patient has progressed nicely.  All  routine  lines, monitors, drainage, devices have been discontinued in the  standard fashion.  He did have a slight elevation in his creatinine to a  level of 2.0 on postoperative day #2, but this has returned back to his  normal range.  Most recent creatinine dated July 19, 2009, was 1.34.  He was initially supported with dopamine, but this was weaned without  difficulty.  He has remained neurologically intact.  He does have an  acute blood loss anemia.  His values are stable.  His most recent  hemoglobin and hematocrit dated July 18, 2009, are 12.8 and 37.8  respectively.  His capillary blood glucoses have been under adequate  control initially using Glucommander and then restarting his oral  metformin.  His incisions are healing well without evidence of  infection.  He has been tolerating routine cardiac rehabilitation and  physical therapy.  He has had some moderate weakness and consideration  of rehab stay is pending his continued recovery.  He is making  significant progress in these regards.  He has required aggressive  pulmonary toilet and nebulized respiratory bronchodilation, but oxygen  has been weaned and he is maintaining adequate saturations on room air.  He has had a moderate postoperative volume overload but is responding  well to diuretics.  Currently, the patient's status is felt to be  tentatively stable for discharge in the next 24-48 hours pending  continued recovery and evaluation of his overall status.   INSTRUCTIONS:  The patient will receive written instructions in regard  to medications, activity, diet, wound care, and followup.  Followup  include appointment to see Dr. Roxan Hockey on August 08, 2009, at  10:45.  Additionally, he is instructed to have an appointment to see Dr.  Johnsie Cancel in 2 weeks post.   Discharge medications on discharge at the time of this dictation are as  follows:  1. Aspirin 325 mg daily.  2. Avodart 0.5 mg daily.  3. Lisinopril 10 mg  daily.  4. Metoprolol 25 mg twice daily.  5. Oxycodone 5 mg IR tablet 1-2 every 4-6 hours as needed.  6. Potassium chloride 20 mEq daily for 7 days.  7. Crestor 10 mg daily.  8. Lasix 40 mg daily for 7 days.  9. Alprazolam 0.25 mg daily p.r.n.  10.Flomax 0.4 mg daily.  11.Lysine 500 mg twice daily p.r.n.  12.Metamucil twice daily p.r.n.  13.Metformin 500 mg daily.  14.Multivitamin 1 daily.   FINAL DIAGNOSIS:  Severe left main and three-vessel coronary artery  disease now  status post surgical revascularization as described.   Other diagnoses include:  1. Hypertension.  2. Gout.  3. Hypogonadism.  4. Insulin-dependent diabetes.  5. Hypercholesterolemia.  6. History of diverticulosis.  7. History of tonsillectomy.  8. History of vasectomy.  9. History of umbilical hernia repair.  10.History of right cataract surgery.  11.Postoperative volume overload.  12.Postoperative acute renal insufficiency.  13.Postoperative anemia.  14.History of tobacco abuse.      John Giovanni, P.A.-C.      Revonda Standard Roxan Hockey, M.D.  Electronically Signed    WEG/MEDQ  D:  07/19/2009  T:  07/19/2009  Job:  [id number]   cc:   Revonda Standard. Roxan Hockey, M.D.  Heinz Knuckles Norins, MD  Wallis Bamberg. Johnsie Cancel, MD, [facility]

## 2011-04-06 NOTE — Letter (Signed)
April 28, 2007    Mark C. Karsten Ro, M.D.  [place]. [address] [place], [place] [address]   RE:  NNAMDI, DACUS  MRN:  [id number]  /  DOB:  09/22/1935   Dear Elta Guadeloupe:   Thank you very much for seeing Mr. Ricardo Washington for a persistent  problem with left lower quadrant abdominal pain. I have been seeing the  patient on multiple occasions for this. I had thought that he had  inflammation along the vas deferens and treated him for that with  ciprofloxacin t.i.d. for seven days with an additional five days of  therapy with ongoing discomfort. The patient had a history of  diverticulitis, but this was not a presentation consistent with that.   The patient's past medical history includes tonsillectomy as a child and  he is status post vasectomy. Medical illnesses include the usual  childhood diseases, non-insulin diabetes, hypertension, gout, and  hypogonadism.   The patient's current medications include:  1. Metamucil.  2. Aspirin 81 mg daily.  3. Metformin 500 mg b.i.d.  4. Avodart 0.5 mg daily.  5. Testosterone injections 200 mg IM  monthly.  6. Lisinopril 20 mg daily.  7. Crestor 5 mg daily.  8. Doxazosin 4 mg q.p.m.  9. Indomethacin 25 mg p.r.n. gout outbreaks.   If I can provide any additional information in regards to this nice  patient, please do not hesitate to contact me.   Thank you very much for your assistance with [name] discomfort  and I look forward to hearing from you.    Sincerely,      Heinz Knuckles. Norins, MD  Electronically Signed    MEN/MedQ  DD: 04/28/2007  DT: 04/28/2007  Job #: [id number]

## 2011-04-25 ENCOUNTER — Other Ambulatory Visit: Payer: Self-pay | Admitting: Internal Medicine

## 2011-05-05 ENCOUNTER — Emergency Department (HOSPITAL_COMMUNITY): Payer: Medicare Other

## 2011-05-05 ENCOUNTER — Emergency Department (HOSPITAL_COMMUNITY)
Admission: EM | Admit: 2011-05-05 | Discharge: 2011-05-05 | Disposition: A | Discharge status: Home / Self Care | Payer: Medicare Other | Attending: Emergency Medicine | Admitting: Emergency Medicine

## 2011-05-05 ENCOUNTER — Encounter: Payer: Self-pay | Admitting: Family Medicine

## 2011-05-05 ENCOUNTER — Ambulatory Visit (INDEPENDENT_AMBULATORY_CARE_PROVIDER_SITE_OTHER): Payer: BC Managed Care – PPO | Admitting: Family Medicine

## 2011-05-05 VITALS — BP 118/60 | HR 100 | Temp 99.2°F | Wt 208.1 lb

## 2011-05-05 DIAGNOSIS — I2510 Atherosclerotic heart disease of native coronary artery without angina pectoris: Secondary | ICD-10-CM | POA: Insufficient documentation

## 2011-05-05 DIAGNOSIS — R509 Fever, unspecified: Secondary | ICD-10-CM | POA: Insufficient documentation

## 2011-05-05 DIAGNOSIS — E119 Type 2 diabetes mellitus without complications: Secondary | ICD-10-CM | POA: Insufficient documentation

## 2011-05-05 DIAGNOSIS — Z951 Presence of aortocoronary bypass graft: Secondary | ICD-10-CM | POA: Insufficient documentation

## 2011-05-05 DIAGNOSIS — I10 Essential (primary) hypertension: Secondary | ICD-10-CM | POA: Insufficient documentation

## 2011-05-05 LAB — BASIC METABOLIC PANEL
BUN: 17 mg/dL (ref 6–23)
CO2: 25 mEq/L (ref 19–32)
Calcium: 9.3 mg/dL (ref 8.4–10.5)
Creatinine, Ser: 1.11 mg/dL (ref 0.50–1.35)
GFR calc non Af Amer: >60 mL/min (ref >60)
Glucose, Bld: 101 mg/dL — ABNORMAL HIGH (ref 70–99)
Sodium: 138 mEq/L (ref 135–145)

## 2011-05-05 LAB — URINALYSIS, ROUTINE W REFLEX MICROSCOPIC
Glucose, UA: NEGATIVE mg/dL
Hgb urine dipstick: NEGATIVE
Protein, ur: NEGATIVE mg/dL
Urobilinogen, UA: 1 mg/dL (ref 0.0–1.0)

## 2011-05-05 LAB — DIFFERENTIAL
Basophils Absolute: 0 10*3/uL (ref 0.0–0.1)
Eosinophils Relative: 0 % (ref 0–5)
Lymphocytes Relative: 9 % — ABNORMAL LOW (ref 12–46)
Monocytes Absolute: 0.9 10*3/uL (ref 0.1–1.0)
Monocytes Relative: 9 % (ref 3–12)
Neutro Abs: 8.7 10*3/uL — ABNORMAL HIGH (ref 1.7–7.7)

## 2011-05-05 LAB — CBC
HCT: 41.4 % (ref 39.0–52.0)
Hemoglobin: 13.7 g/dL (ref 13.0–17.0)
MCH: 32.9 pg (ref 26.0–34.0)
MCHC: 33.1 g/dL (ref 30.0–36.0)
MCV: 99.3 fL (ref 78.0–100.0)
RDW: 13.6 % (ref 11.5–15.5)

## 2011-05-05 NOTE — Progress Notes (Signed)
  Subjective:    Patient ID: Ricardo Washington, male    DOB: Mar 29, 1935, 76 y.o.   MRN: [id number]  HPI Fever and chills this am  Was really cold  bp was ok -- but he felt hot  Temp 101 .5   - is down a bit now   Took a xanax this am and regular am pills  Suddenly cannot walk -- and cannot get out of chair by himeself   Unable to give a urine    Review of Systems     Objective:   Physical Exam  Musculoskeletal:       Tries to get out of wheelchair but quite unsteady and weak   Psychiatric:       Seems confused - does not know why he is here           Assessment & Plan:

## 2011-05-05 NOTE — Assessment & Plan Note (Signed)
With sudden inability to ambulate and also some confusion Unable to give urine  Sent to the ER immediately

## 2011-05-05 NOTE — Patient Instructions (Signed)
Please go across the street to the emergency room at [facility]  I am worried about fever- you may need urgent blood work and cath urine

## 2011-05-07 ENCOUNTER — Telehealth: Payer: Self-pay | Admitting: Internal Medicine

## 2011-05-07 ENCOUNTER — Other Ambulatory Visit: Payer: Self-pay | Admitting: Internal Medicine

## 2011-05-07 NOTE — Telephone Encounter (Signed)
[facility] Triage Call Report Triage Record Num: [id number] Operator: Noemi Chapel Patient Name: Ricardo Washington Call Date & Time: 05/05/2011 10:45:54AM Patient Phone: [phone] PCP: Adella Hare Patient Gender: Male PCP Fax : [phone] Patient DOB: 07-Oct-1935 Practice Name: [facility] Reason for Call: Wife/JoAnn calling to advise this gentleman has fever of 101.5 and chills. Woke with fever early this am and c/o feeling bad. RN advised he should be seen in the office for eval of sxs. Appt scheduled by Lorriane Shire in the office for 11:45. Caller is agreeable. Protocol(s) Used: Office Note Recommended Outcome per Protocol: Information Noted and Sent to Office Reason for Outcome: Caller information to office Care Advice: ~ 06/

## 2011-05-25 ENCOUNTER — Encounter: Payer: Self-pay | Admitting: Internal Medicine

## 2011-05-25 ENCOUNTER — Ambulatory Visit (INDEPENDENT_AMBULATORY_CARE_PROVIDER_SITE_OTHER): Payer: Medicare Other | Admitting: Internal Medicine

## 2011-05-25 VITALS — BP 120/68 | HR 80 | Temp 97.6°F | Resp 16 | Wt 202.0 lb

## 2011-05-25 DIAGNOSIS — R05 Cough: Secondary | ICD-10-CM

## 2011-05-25 DIAGNOSIS — R059 Cough, unspecified: Secondary | ICD-10-CM

## 2011-05-25 MED ORDER — PREDNISONE 10 MG PO TABS: ORAL_TABLET | ORAL | Status: DC

## 2011-05-25 MED ORDER — PROMETHAZINE-CODEINE 6.25-10 MG/5ML PO SYRP: 5.0000 mL | ORAL_SOLUTION | ORAL | Status: DC | PRN

## 2011-05-25 NOTE — Patient Instructions (Signed)
Resolving bronchitis - you have antibiotic on-board through July 10th. There is no fever to indicate infection. The oxygen level is excellent at 95%. You have a persistent post-infective cough. Plan - mucinex 2 tabs AM and PM; continue taking the benzonatate perles 171m three times a day; promethazine with codeine cough syrup 1 tsp every 4-6 hours as needed. Prednisone 10 mg once a day for 7 days.

## 2011-05-25 NOTE — Progress Notes (Signed)
  Subjective:    Patient ID: Ricardo Washington, male    DOB: 27-Jan-1935, 76 y.o.   MRN: [id number]  HPI [name] was seen in the Saturday clinic June 16th with fever and weakness. He was unable to give urine specimen or get out of w/c. He was sent to [facility] - notes reviewed: no fever at that time. Chest x-ray was negative for infiltrates or effusions. Labs were normal with no leukocytosis, chemistries were normal. He was sent home.He then developed fever, chills and productive cough. He went to [facility] June [facility]: no lab or x-ray. He was diagnosed with bronchitis and he was started on z-pak and tessallon perles. He continued to have problems and he took z-pak, tessalon perles and cough syrup. He was only marginally improved with continued cough productive of thick mucus. He has not had recurrent fever, no chills, no recurrent weakness.    Review of Systems Review of Systems  Constitutional:  Negative for fever, chills, activity change and unexpected weight change.  HEENT:  Negative for hearing loss, ear pain, congestion, neck stiffness and postnasal drip. Negative for sore throat or swallowing problems. Negative for dental complaints.   Eyes: Negative for vision loss or change in visual acuity.  Respiratory: Negative for chest tightness and wheezing.   Cardiovascular: Negative for chest pain and palpitation. No decreased exercise tolerance Gastrointestinal: No change in bowel habit. No bloating or gas. No reflux or indigestion Genitourinary: Negative for urgency, frequency, flank pain and difficulty urinating.  Musculoskeletal: Negative for myalgias, back pain, arthralgias and gait problem.  Neurological: Negative for dizziness, tremors, weakness and headaches.  Hematological: Negative for adenopathy.  Psychiatric/Behavioral: Negative for behavioral problems and dysphoric mood.       Objective:   Physical Exam Vitals noted. Pulse Ox 95% Gen'l - older white male in no  distress HEENT- no sinus tendernes Nodes - no adenopathy cerivcal Chest - CTAP without rales or wheezes Cor -RRR       Assessment & Plan:  Cough - no evidence of bacterial infection. Suspect post-infectious cyclical cough  Plan - continue tesslon perles           Take mucinex 2 abs bid           Add promethazine w/ codeine 1 tsp q 6 hrs as needed.            Short course of steroids for anti-inflammatory benefit.

## 2011-06-05 ENCOUNTER — Encounter: Payer: Self-pay | Admitting: Internal Medicine

## 2011-06-05 ENCOUNTER — Ambulatory Visit (INDEPENDENT_AMBULATORY_CARE_PROVIDER_SITE_OTHER): Payer: Medicare Other | Admitting: Internal Medicine

## 2011-06-05 VITALS — BP 110/60 | HR 77 | Temp 97.4°F | Ht 66.0 in | Wt 205.1 lb

## 2011-06-05 DIAGNOSIS — J209 Acute bronchitis, unspecified: Secondary | ICD-10-CM | POA: Insufficient documentation

## 2011-06-05 DIAGNOSIS — I10 Essential (primary) hypertension: Secondary | ICD-10-CM

## 2011-06-05 DIAGNOSIS — E119 Type 2 diabetes mellitus without complications: Secondary | ICD-10-CM

## 2011-06-05 MED ORDER — LEVOFLOXACIN 250 MG PO TABS: 250.0000 mg | ORAL_TABLET | Freq: Every day | ORAL | Status: AC

## 2011-06-05 NOTE — Assessment & Plan Note (Signed)
stable overall by hx and exam, most recent data reviewed with pt, and pt to continue medical treatment as before, pt to call for any worsening polys or cbg > 200 Lab Results  Component Value Date   HGBA1C 6.8* 03/15/2011

## 2011-06-05 NOTE — Progress Notes (Signed)
Subjective:    Patient ID: Ricardo Washington, male    DOB: 1935-02-19, 76 y.o.   MRN: [id number]  HPI Here with acute onset mild to mod 2-3 days ST, HA, general weakness and malaise, with mild prod cough greenish sputum, but Pt denies chest pain, increased sob or doe, wheezing, orthopnea, PND, increased LE swelling, palpitations, dizziness or syncope. Had been treated originally June 30 at urgent care with zpack, f/u here July 6 for cough med and overall improved, but now worse again.  Pt denies new neurological symptoms such as new headache, or facial or extremity weakness or numbness   Pt denies polydipsia, polyuria, or low sugar symptoms such as weakness or confusion improved with po intake.  Overall good compliance with treatment, and good medicine tolerability. Past Medical History  Diagnosis Date  . Cataract   . Hypertrophy of prostate without urinary obstruction and other lower urinary tract symptoms (LUTS)   . S/P coronary artery bypass graft x 3   . Coronary artery disease   . Fatigue   . Past use of tobacco   . Diverticulosis of colon     Adenomatous '2002  . History of colonic polyps   . Rectal bleeding   . Other and unspecified hyperlipidemia   . Hypogonadism male   . Gout    Past Surgical History  Procedure Date  . Tonsillectomy   . Vasectomy   . Umbilical hernia repair     2009  . Cataract extraction, bilateral   . Ptca   . Median sternotomy extracorporel circulation coronary   . Descending, saphenous vein graft to first obtuse marginal, sequential   . Saphenous vein graft to posterior descending and posterial lateral   . Endoscopic vein harvest right thigh     reports that he quit smoking about 28 years ago. His smoking use included Cigarettes. He has a 30 pack-year smoking history. He does not have any smokeless tobacco history on file. He reports that he does not drink alcohol. His drug history not on file. family history includes Cancer in his father and  mother. Allergies  Allergen Reactions  . Sulfonamide Derivatives    Current Outpatient Prescriptions on File Prior to Visit  Medication Sig Dispense Refill  . ALPRAZolam (NIRAVAM) 0.25 MG dissolvable tablet Take 0.25 mg by mouth at bedtime as needed.        Marland Kitchen aspirin 325 MG tablet Take 325 mg by mouth daily.        . CRESTOR 10 MG tablet TAKE 1 TABLET DAILY  90 tablet  1  . finasteride (PROSCAR) 5 MG tablet TAKE 1 TABLET DAILY  3 tablet  1  . HYDROcodone-acetaminophen (NORCO) 5-325 MG per tablet Take 1 tablet by mouth every 6 (six) hours as needed.        . indomethacin (INDOCIN) 25 MG capsule Take 25 mg by mouth 3 (three) times daily with meals.        Marland Kitchen lisinopril (PRINIVIL,ZESTRIL) 5 MG tablet TAKE 1 TABLET DAILY  90 tablet  1  . Lysine 500 MG TABS Take by mouth.        . metFORMIN (GLUCOPHAGE) 500 MG tablet Take 500 mg by mouth 2 (two) times daily with a meal.        . metFORMIN (GLUCOPHAGE-XR) 500 MG 24 hr tablet TAKE 1 TABLET TWICE A DAY  180 tablet  3  . metoprolol succinate (TOPROL-XL) 25 MG 24 hr tablet Take 25 mg by mouth 2 (two) times daily.        Marland Kitchen  metoprolol tartrate (LOPRESSOR) 25 MG tablet TAKE 1 TABLET TWICE A DAY  180 tablet  1  . MULTIPLE VITAMIN PO Take by mouth.        Marland Kitchen oxycodone (OXY-IR) 5 MG capsule Take 5 mg by mouth as needed.        . Tamsulosin HCl (FLOMAX) 0.4 MG CAPS TAKE 1 CAPSULE DAILY  90 capsule  1  . TRUETEST TEST test strip TEST 2 TIMES A DAY  200 each  2  . azithromycin (ZITHROMAX) 250 MG tablet Take 250 mg by mouth daily.        . benzonatate (TESSALON) 100 MG capsule Take 100 mg by mouth 3 (three) times daily as needed.        . predniSONE (DELTASONE) 10 MG tablet One tablet daily for 7 days  7 tablet  0  . promethazine-codeine (PHENERGAN WITH CODEINE) 6.25-10 MG/5ML syrup Take 5 mLs by mouth every 4 (four) hours as needed for cough.  120 mL  0  . psyllium (METAMUCIL SMOOTH TEXTURE) 28 % packet Take 1 packet by mouth 2 (two) times daily.            Review of Systems Review of Systems  Constitutional: Negative for diaphoresis and unexpected weight change.  HENT: Negative for drooling and tinnitus.   Eyes: Negative for photophobia and visual disturbance.  Respiratory: Negative for choking and stridor.   Gastrointestinal: Negative for vomiting and blood in stool.  Genitourinary: Negative for hematuria and decreased urine volume.       Objective:   Physical Exam BP 110/60  Pulse 77  Temp(Src) 97.4 F (36.3 C) (Oral)  Ht 5' 6"  (1.676 m)  Wt 205 lb 2 oz (93.044 kg)  BMI 33.11 kg/m2  SpO2 96% Physical Exam  VS noted, mild ill appearing Constitutional: Pt appears well-developed and well-nourished.  HENT: Head: Normocephalic.  Right Ear: External ear normal.  Left Ear: External ear normal.  Bilat tm's mild erythema.  Sinus nontender.  Pharynx mild erythema Eyes: Conjunctivae and EOM are normal. Pupils are equal, round, and reactive to light.  Neck: Normal range of motion. Neck supple.  Cardiovascular: Normal rate and regular rhythm.   Pulmonary/Chest: Effort normal and breath sounds normal.  Neurological: Pt is alert. No cranial nerve deficit.  Skin: Skin is warm. No erythema.  Psychiatric: Pt behavior is normal. Thought content normal. 1+ nervous        Assessment & Plan:

## 2011-06-05 NOTE — Patient Instructions (Addendum)
Take all new medications as prescribed Continue all other medications as before You can also take Delsym OTC for cough, and/or Mucinex (or it's generic off brand) for congestion

## 2011-06-05 NOTE — Assessment & Plan Note (Signed)
Mild to mod, for antibx course,  to f/u any worsening symptoms or concerns 

## 2011-06-05 NOTE — Assessment & Plan Note (Signed)
stable overall by hx and exam, most recent data reviewed with pt, and pt to continue medical treatment as before  BP Readings from Last 3 Encounters:  06/05/11 110/60  05/25/11 120/68  05/05/11 118/60

## 2011-07-13 ENCOUNTER — Telehealth: Payer: Self-pay | Admitting: Internal Medicine

## 2011-07-13 NOTE — Telephone Encounter (Signed)
Pt already has this RX, he will f/u for persistent cough (since June) on Monday w/Dr Norins

## 2011-07-13 NOTE — Telephone Encounter (Signed)
Patient spouse called and stated patient can not get rid of his cough. Can you call him something in to the pharmacy? Please Advise.

## 2011-07-13 NOTE — Telephone Encounter (Signed)
Ok for promethazine w/ codeine 120 ml, 1 tsp q6 prn, 1 refill

## 2011-07-16 ENCOUNTER — Ambulatory Visit (INDEPENDENT_AMBULATORY_CARE_PROVIDER_SITE_OTHER): Payer: Medicare Other | Admitting: Internal Medicine

## 2011-07-16 ENCOUNTER — Encounter: Payer: Self-pay | Admitting: Internal Medicine

## 2011-07-16 VITALS — BP 110/60 | HR 68 | Temp 97.4°F | Wt 198.0 lb

## 2011-07-16 DIAGNOSIS — R05 Cough: Secondary | ICD-10-CM

## 2011-07-16 NOTE — Patient Instructions (Signed)
Cough - multi-factorial: suspect 1) post nasal drainage - take claritin 10 mg once a day and continue mucinex. 2) take prilosec otc 20g every AM to be sure you are not having an reflux that is the cause of cough. Be sure you have at least 1 hr between eating and bedtime. Consider putting the head of the bed on blocks. 3) Coughing with eating suggests that you may have a swallowing problem and that could be dangerous leading to recurrent aspiration infections of the lungs. Plan 0- a modified barium swallow with speech pathology to rule out "dysphagia"  Dysphagia (Swallowing Problems) Swallowing problems occur when solids and liquids seem to stick in your throat on the way down to your stomach, or the food takes longer to get to the stomach. Other symptoms (problems) include regurgitating (burping) up food, noises coming from the throat, chest discomfort with swallowing, and a feeling of fullness in the throat when swallowing. When blockage in the throat is complete it may be associated with drooling. CAUSES OF DYSPHAGIA (SWALLOWING PROBLEMS) There are many causes of swallowing difficulties and the following is generalized information regarding a number of reasons for this problem. Problems with swallowing may occur because of problems with the muscles. The food cannot be propelled in the usual manner into the stomach. There may be ulcers, scar tissue or inflammation (soreness) in the esophagus (the food tube from the mouth to the stomach) which blocks food from passing normally into the stomach. Causes of inflammation include acid reflux from the stomach into the esophagus. Inflammation can also be caused by the herpes simplex virus, Candida (yeast), radiation (as with treatment of cancer), or inflammation from medications not taken with adequate fluids to wash them down into the stomach. There may be nerve problems so signals cannot be sent adequately telling the muscles of the esophagus to contract and move  the food along. Achalasia is a rare disorder of the esophagus in which muscular contractions of the esophagus are uncoordinated. Globus hystericus is a relatively common problem in young females in which there is a sense of an obstruction or difficulty in swallowing, but in which no abnormalities can be found. This problem usually improves over time with reassurance and testing to rule out other causes. EVALUATION (DIAGNOSIS) OF SWALLOWING PROBLEMS A number of tests will help your caregiver know what is the cause of your swallowing problems. These tests may include a barium swallow in which x-rays are taken while you are drinking a liquid that outlines the lining of the esophagus on x-ray. If the stomach and small bowel are also studied in this manner it is called an upper gastrointestinal exam (UGI). Endoscopy may be done in which your caregiver examines your throat, esophagus, stomach and small bowel with an instrument like a small flexible telescope. Motility studies which measure the effectiveness and coordination of the muscular contractions of the esophagus may also be done. TREATMENT AND ITS IMPORTANCE The treatment of swallowing problems are many, varying from medications to surgical treatment. The treatment varies with the type of problem found. Your caregiver will discuss your results and treatment with you. If swallowing problems are severe the long term problems which may occur include: malnutrition, pneumonia (from food going into the breathing tubes called trachea and bronchi), and an increase in tumors (lumps) of the esophagus. SEEK IMMEDIATE MEDICAL ATTENTION IF:  Food or other object becomes lodged in your throat or esophagus and won't move.  Document Released: 11/02/2000 Document Re-Released: 01/30/2010 [employer] Patient Information  2011 [place], [place].

## 2011-07-16 NOTE — Assessment & Plan Note (Signed)
Multi-factorial with no signs of any respiratory illness today. Suspect both reflux and post-nasal drainage. He also reports cough with meals that suggests swallow difficulty,  Plan - OK to continue mucinex and will add claritin           For possible reflux will add prilosec OTC 10m q AM           Schedule for [facility] MBSS

## 2011-07-16 NOTE — Progress Notes (Signed)
  Subjective:    Patient ID: Ricardo Washington, male    DOB: [birth year], 76 y.o.   MRN: [id number]  HPI Ricardo Washington has had a cough since June. He was prescribed codeine cough syrup for persistent cough and had also had a bout of bronchits. The cough is mostly in the evening and night. Mucinex helps. He has cut back on the cough syrup. He takes no medication for gastric acid. He does report that he has a problem with coughing with meals. He says he feels like his throat has narrowed!  I have reviewed the patient's medical history in detail and updated the computerized patient record.    Review of Systems System review is negative for any constitutional, cardiac, pulmonary, GI or neuro symptoms or complaints     Objective:   Physical Exam Vitals - stable Gen'l - WNWD overweight (but down 8 lbs) white male HEENT - normal Chest- good breath sounds, no rales or wheezing. Cor - RRR       Assessment & Plan:

## 2011-07-17 ENCOUNTER — Other Ambulatory Visit (HOSPITAL_COMMUNITY): Payer: Self-pay | Admitting: Internal Medicine

## 2011-07-27 ENCOUNTER — Ambulatory Visit (INDEPENDENT_AMBULATORY_CARE_PROVIDER_SITE_OTHER): Payer: Medicare Other | Admitting: *Deleted

## 2011-07-27 DIAGNOSIS — Z23 Encounter for immunization: Secondary | ICD-10-CM

## 2011-08-01 ENCOUNTER — Encounter (HOSPITAL_COMMUNITY): Payer: Medicare Other

## 2011-08-01 ENCOUNTER — Ambulatory Visit (HOSPITAL_COMMUNITY)
Admission: RE | Admit: 2011-08-01 | Discharge: 2011-08-01 | Disposition: A | Discharge status: Home / Self Care | Payer: Medicare Other | Source: Ambulatory Visit | Attending: Internal Medicine | Admitting: Internal Medicine

## 2011-08-01 DIAGNOSIS — R1310 Dysphagia, unspecified: Secondary | ICD-10-CM | POA: Insufficient documentation

## 2011-08-01 DIAGNOSIS — R059 Cough, unspecified: Secondary | ICD-10-CM | POA: Insufficient documentation

## 2011-08-01 DIAGNOSIS — R05 Cough: Secondary | ICD-10-CM | POA: Insufficient documentation

## 2011-08-20 ENCOUNTER — Encounter: Payer: Self-pay | Admitting: Internal Medicine

## 2011-08-27 LAB — DIFFERENTIAL
Basophils Absolute: 0
Lymphocytes Relative: 31
Neutro Abs: 4.1

## 2011-08-27 LAB — COMPREHENSIVE METABOLIC PANEL
Albumin: 3.7
BUN: 18
CO2: 29
Calcium: 9.2
Chloride: 107
Creatinine, Ser: 1.08
GFR calc non Af Amer: >60
Total Bilirubin: 0.8

## 2011-08-27 LAB — CBC
HCT: 42.4
MCHC: 35
MCV: 96.7
Platelets: 232
WBC: 7

## 2011-08-27 LAB — URINALYSIS, ROUTINE W REFLEX MICROSCOPIC
Bilirubin Urine: NEGATIVE
Glucose, UA: NEGATIVE
Hgb urine dipstick: NEGATIVE
Protein, ur: NEGATIVE

## 2011-09-12 ENCOUNTER — Telehealth: Payer: Self-pay | Admitting: *Deleted

## 2011-09-12 MED ORDER — PROMETHAZINE-CODEINE 6.25-10 MG/5ML PO SYRP
5.0000 mL | ORAL_SOLUTION | ORAL | Status: AC | PRN
Start: 2011-09-12 — End: 2011-09-22

## 2011-09-12 NOTE — Telephone Encounter (Signed)
Refill request for Prometh/codeine syr QTY 120 ML SIG take 5 mL by mouth  Every 4 hours prn for cough, Please Advise refills

## 2011-09-12 NOTE — Telephone Encounter (Signed)
[facility] for refill  And one additional

## 2011-09-12 NOTE — Telephone Encounter (Signed)
Patient informed. 

## 2011-09-15 ENCOUNTER — Other Ambulatory Visit: Payer: Self-pay | Admitting: Internal Medicine

## 2011-09-17 NOTE — Telephone Encounter (Signed)
Done

## 2011-10-23 ENCOUNTER — Encounter: Payer: Self-pay | Admitting: Cardiovascular Disease

## 2011-10-23 ENCOUNTER — Ambulatory Visit (INDEPENDENT_AMBULATORY_CARE_PROVIDER_SITE_OTHER): Payer: Medicare Other | Admitting: Cardiovascular Disease

## 2011-10-23 VITALS — BP 129/74 | HR 62 | Ht 66.0 in | Wt 209.0 lb

## 2011-10-23 DIAGNOSIS — E785 Hyperlipidemia, unspecified: Secondary | ICD-10-CM

## 2011-10-23 DIAGNOSIS — I2510 Atherosclerotic heart disease of native coronary artery without angina pectoris: Secondary | ICD-10-CM

## 2011-10-23 NOTE — Progress Notes (Signed)
Patient ID: Ricardo Washington, male   DOB: Apr 07, 1935, 76 y.o.   MRN: [id number] Ricardo Washington is seen today post CABG. He was done in 8/10 by Dr Roxan Hockey. He had a lima to lad, svg PDA/PLA and svg OM. His LV functoin is preserved. He is active and will finish cardiac rehab in December. He is a member of a gym in [place] and will continue there for the new year. His BP was a little low at rehab and we decreased his lisinopril to 23m. He has not had any SSCP, palpitations or dizzyness. He has been compliant with his meds. Samples of Crestor were given since he is in the donut hole. He has multiple somatic complaints including lower back pain. He was told it is ok to go out in hot weather, do yard work and golf.   ROS: Denies fever, malais, weight loss, blurry vision, decreased visual acuity, cough, sputum, SOB, hemoptysis, pleuritic pain, palpitaitons, heartburn, abdominal pain, melena, lower extremity edema, claudication, or rash.  All other systems reviewed and negative  General: Affect appropriate Healthy:  appears stated age H36 normal Neck supple with no adenopathy JVP normal no bruits no thyromegaly Lungs clear with no wheezing and good diaphragmatic motion Heart:  S1/S2 no murmur,rub, gallop or click PMI normal Abdomen: benighn, BS positve, no tenderness, no AAA no bruit.  No HSM or HJR Distal pulses intact with no bruits No edema Neuro non-focal Skin warm and dry No muscular weakness   Current Outpatient Prescriptions  Medication Sig Dispense Refill  . ALPRAZolam (NIRAVAM) 0.25 MG dissolvable tablet Take 0.25 mg by mouth at bedtime as needed.        .Marland Kitchenaspirin 325 MG tablet Take 325 mg by mouth daily.        . CRESTOR 10 MG tablet TAKE 1 TABLET DAILY  90 tablet  1  . finasteride (PROSCAR) 5 MG tablet TAKE 1 TABLET DAILY  3 tablet  1  . HYDROcodone-acetaminophen (NORCO) 5-325 MG per tablet Take 1 tablet by mouth every 6 (six) hours as needed.        . indomethacin (INDOCIN) 25 MG  capsule Take 25 mg by mouth 3 (three) times daily with meals.        . Lancets (ONETOUCH ULTRASOFT) lancets TEST ONCE DAILY AS NEEDED  100 each  3  . lisinopril (PRINIVIL,ZESTRIL) 5 MG tablet TAKE 1 TABLET DAILY  90 tablet  1  . Lysine 500 MG TABS Take by mouth as needed.       . metFORMIN (GLUCOPHAGE) 500 MG tablet Take 500 mg by mouth 2 (two) times daily with a meal.        . metoprolol tartrate (LOPRESSOR) 25 MG tablet TAKE 1 TABLET TWICE A DAY  180 tablet  1  . MULTIPLE VITAMIN PO Take 1 tablet by mouth daily.       .Marland Kitchenoxycodone (OXY-IR) 5 MG capsule Take 5 mg by mouth as needed.        . psyllium (METAMUCIL SMOOTH TEXTURE) 28 % packet Take 1 packet by mouth 2 (two) times daily.        . Tamsulosin HCl (FLOMAX) 0.4 MG CAPS TAKE 1 CAPSULE DAILY  90 capsule  1  . TRUETEST TEST test strip TEST 2 TIMES A DAY  200 each  2    Allergies  Sulfonamide derivatives  Electrocardiogram:  NSR 62 normal ECG  Assessment and Plan

## 2011-10-23 NOTE — Assessment & Plan Note (Signed)
Well controlled.  Continue current medications and low sodium Dash type diet.    

## 2011-10-23 NOTE — Assessment & Plan Note (Signed)
Stable with no angina and good activity level.  Continue medical Rx  

## 2011-10-23 NOTE — Assessment & Plan Note (Signed)
Cholesterol is at goal.  Continue current dose of statin and diet Rx.  No myalgias or side effects.  F/U  LFT's in 6 months. Lab Results  Component Value Date   LDLCALC 39 03/15/2011

## 2011-10-23 NOTE — Patient Instructions (Signed)
Your physician wants you to follow-up in:  12 months.  You will receive a reminder letter in the mail two months in advance. If you don't receive a letter, please call our office to schedule the follow-up appointment.   

## 2011-10-25 ENCOUNTER — Other Ambulatory Visit: Payer: Self-pay | Admitting: Internal Medicine

## 2011-12-18 ENCOUNTER — Other Ambulatory Visit: Payer: Self-pay | Admitting: *Deleted

## 2011-12-18 MED ORDER — TAMSULOSIN HCL 0.4 MG PO CAPS: ORAL_CAPSULE | ORAL | Status: DC

## 2011-12-18 MED ORDER — METOPROLOL TARTRATE 25 MG PO TABS: ORAL_TABLET | ORAL | Status: DC

## 2011-12-18 MED ORDER — ROSUVASTATIN CALCIUM 10 MG PO TABS: ORAL_TABLET | ORAL | Status: DC

## 2011-12-18 MED ORDER — METFORMIN HCL 500 MG PO TABS: ORAL_TABLET | ORAL | Status: DC

## 2011-12-18 MED ORDER — LISINOPRIL 5 MG PO TABS: ORAL_TABLET | ORAL | Status: DC

## 2011-12-18 MED ORDER — FINASTERIDE 5 MG PO TABS: ORAL_TABLET | ORAL | Status: DC

## 2011-12-21 ENCOUNTER — Other Ambulatory Visit: Payer: Self-pay | Admitting: *Deleted

## 2011-12-21 MED ORDER — METFORMIN HCL ER 500 MG PO TB24: 500.0000 mg | ORAL_TABLET | Freq: Two times a day (BID) | ORAL | Status: DC

## 2012-04-02 ENCOUNTER — Other Ambulatory Visit: Payer: Self-pay | Admitting: Dermatology

## 2012-06-02 ENCOUNTER — Other Ambulatory Visit: Payer: Self-pay | Admitting: Internal Medicine

## 2012-07-30 ENCOUNTER — Ambulatory Visit (INDEPENDENT_AMBULATORY_CARE_PROVIDER_SITE_OTHER): Payer: Medicare Other | Admitting: *Deleted

## 2012-07-30 DIAGNOSIS — Z23 Encounter for immunization: Secondary | ICD-10-CM

## 2012-08-05 ENCOUNTER — Ambulatory Visit (INDEPENDENT_AMBULATORY_CARE_PROVIDER_SITE_OTHER): Payer: Medicare Other | Admitting: Internal Medicine

## 2012-08-05 ENCOUNTER — Encounter: Payer: Self-pay | Admitting: Internal Medicine

## 2012-08-05 ENCOUNTER — Other Ambulatory Visit (INDEPENDENT_AMBULATORY_CARE_PROVIDER_SITE_OTHER): Payer: Medicare Other

## 2012-08-05 VITALS — BP 106/62 | HR 61 | Temp 97.5°F | Resp 16 | Wt 204.0 lb

## 2012-08-05 DIAGNOSIS — Z86010 Personal history of colonic polyps: Secondary | ICD-10-CM

## 2012-08-05 DIAGNOSIS — E119 Type 2 diabetes mellitus without complications: Secondary | ICD-10-CM

## 2012-08-05 DIAGNOSIS — E785 Hyperlipidemia, unspecified: Secondary | ICD-10-CM

## 2012-08-05 DIAGNOSIS — Z0000 Encounter for general adult medical examination without abnormal findings: Secondary | ICD-10-CM | POA: Insufficient documentation

## 2012-08-05 DIAGNOSIS — M109 Gout, unspecified: Secondary | ICD-10-CM

## 2012-08-05 DIAGNOSIS — Z23 Encounter for immunization: Secondary | ICD-10-CM

## 2012-08-05 DIAGNOSIS — I2510 Atherosclerotic heart disease of native coronary artery without angina pectoris: Secondary | ICD-10-CM

## 2012-08-05 DIAGNOSIS — N400 Enlarged prostate without lower urinary tract symptoms: Secondary | ICD-10-CM

## 2012-08-05 DIAGNOSIS — I10 Essential (primary) hypertension: Secondary | ICD-10-CM

## 2012-08-05 LAB — COMPREHENSIVE METABOLIC PANEL
ALT: 16 U/L (ref 0–53)
AST: 19 U/L (ref 0–37)
Albumin: 4 g/dL (ref 3.5–5.2)
CO2: 27 mEq/L (ref 19–32)
Calcium: 8.3 mg/dL — ABNORMAL LOW (ref 8.4–10.5)
Chloride: 99 mEq/L (ref 96–112)
Creatinine, Ser: 1 mg/dL (ref 0.4–1.5)
GFR: 78.19 mL/min (ref >60.00)
Potassium: 4.2 mEq/L (ref 3.5–5.1)
Sodium: 139 mEq/L (ref 135–145)
Total Protein: 6.7 g/dL (ref 6.0–8.3)

## 2012-08-05 LAB — LIPID PANEL
LDL Cholesterol: 20 mg/dL (ref 0–99)
Total CHOL/HDL Ratio: 2
Triglycerides: 175 mg/dL — ABNORMAL HIGH (ref 0.0–149.0)

## 2012-08-05 LAB — HEPATIC FUNCTION PANEL
ALT: 16 U/L (ref 0–53)
Albumin: 4 g/dL (ref 3.5–5.2)
Bilirubin, Direct: 0.1 mg/dL (ref 0.0–0.3)
Total Protein: 6.7 g/dL (ref 6.0–8.3)

## 2012-08-05 LAB — URIC ACID: Uric Acid, Serum: 6.1 mg/dL (ref 4.0–7.8)

## 2012-08-05 MED ORDER — INDOMETHACIN 25 MG PO CAPS: 25.0000 mg | ORAL_CAPSULE | Freq: Three times a day (TID) | ORAL | Status: DC | PRN

## 2012-08-05 NOTE — Progress Notes (Signed)
Subjective:    Patient ID: Ricardo Washington, male    DOB: 1935-10-06, 77 y.o.   MRN: [id number]  HPI The patient is here for annual Medicare wellness examination and management of other chronic and acute problems.  He has had no major medical illness, surgery or injury.    The risk factors are reflected in the social history.  The roster of all physicians providing medical care to patient - is listed in the Snapshot section of the chart.  Activities of daily living:  The patient is 100% inedpendent in all ADLs: dressing, toileting, feeding as well as independent mobility  Home safety : The patient has smoke detectors in the home. They wear seatbelts.  Fall - fall safe. No firearms at homeThere is no violence in the home.   There is no risks for hepatitis, STDs or HIV. There is no   history of blood transfusion. They have no travel history to infectious disease endemic areas of the world.  The patient has seen their dentist in the last six month. They have seen their eye doctor in the last year. They deny  any hearing difficulty and have not had audiologic testing in the last year.    They do not  have excessive sun exposure. Discussed the need for sun protection: hats, long sleeves and use of sunscreen if there is significant sun exposure.   Diet: the importance of a healthy diet is discussed. They do have a healthy diet.  The patient has no regular exercise program.  The benefits of regular aerobic exercise were discussed.  Depression screen: there are no signs or vegative symptoms of depression- irritability, change in appetite, anhedonia, sadness/tearfullness.  Cognitive assessment: the patient manages all their financial and personal affairs and is actively engaged. Orientation - day/date/year; 2/3 at 3 min; normal clock face.  During the course of the visit the patient was educated and counseled about appropriate screening and preventive services including : fall prevention ,  diabetes screening, nutrition counseling, colorectal cancer screening, and recommended immunizations.  Past Medical History  Diagnosis Date  . Cataract   . Hypertrophy of prostate without urinary obstruction and other lower urinary tract symptoms (LUTS)   . S/P coronary artery bypass graft x 3   . Coronary artery disease   . Fatigue   . Past use of tobacco   . Diverticulosis of colon     Adenomatous '2002  . History of colonic polyps   . Rectal bleeding   . Other and unspecified hyperlipidemia   . Hypogonadism male   . Gout    Past Surgical History  Procedure Date  . Tonsillectomy   . Vasectomy   . Umbilical hernia repair     2009  . Cataract extraction, bilateral   . Ptca   . Median sternotomy extracorporel circulation coronary   . Descending, saphenous vein graft to first obtuse marginal, sequential   . Saphenous vein graft to posterior descending and posterial lateral   . Endoscopic vein harvest right thigh    Family History  Problem Relation Age of Onset  . Cancer Mother   . Cancer Father    History   Social History  . Marital Status: Married    Spouse Name: N/A    Number of Children: 3  . Years of Education: 16   Occupational History  . Not on file.   Social History Main Topics  . Smoking status: Former Smoker -- 1.0 packs/day for 30 years  Types: Cigarettes    Quit date: 03/15/1983  . Smokeless tobacco: Not on file   Comment: Quit 1984  . Alcohol Use: No     Quit 1999  . Drug Use: Not on file  . Sexually Active: No   Other Topics Concern  . Not on file   Social History Narrative   [employer]- Associate Professor. 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 Married 7. 3 sons- '60, '61, '64; grandchildren 53. Marriage in good health - getting along. End of life Care: DNR, no prolonged heroic measures or prolonged supportive care. Provided packet on end of life along with unsigned MOST. (03/15/11)     Current  Outpatient Prescriptions on File Prior to Visit  Medication Sig Dispense Refill  . ALPRAZolam (NIRAVAM) 0.25 MG dissolvable tablet Take 0.25 mg by mouth at bedtime as needed.        Marland Kitchen aspirin 325 MG tablet Take 325 mg by mouth daily.        . finasteride (PROSCAR) 5 MG tablet TAKE 1 TABLET DAILY  90 tablet  3  . Lancets (ONETOUCH ULTRASOFT) lancets TEST ONCE DAILY AS NEEDED  100 each  3  . lisinopril (PRINIVIL,ZESTRIL) 5 MG tablet TAKE 1 TABLET DAILY  90 tablet  3  . Lysine 500 MG TABS Take by mouth as needed.       . metFORMIN (GLUCOPHAGE-XR) 500 MG 24 hr tablet Take 1 tablet (500 mg total) by mouth 2 (two) times daily.  180 tablet  3  . metoprolol tartrate (LOPRESSOR) 25 MG tablet TAKE 1 TABLET TWICE A DAY  180 tablet  3  . MULTIPLE VITAMIN PO Take 1 tablet by mouth daily.       . psyllium (METAMUCIL SMOOTH TEXTURE) 28 % packet Take 1 packet by mouth 2 (two) times daily.        . rosuvastatin (CRESTOR) 10 MG tablet TAKE 1 TABLET DAILY  90 tablet  3  . Tamsulosin HCl (FLOMAX) 0.4 MG CAPS TAKE 1 CAPSULE DAILY  90 capsule  3  . TRUETEST TEST test strip TEST 2 TIMES A DAY  200 each  1       Review of Systems Constitutional:  Negative for fever, chills, activity change and unexpected weight change.  HEENT:  Negative for hearing loss, ear pain, congestion, neck stiffness and postnasal drip. Negative for sore throat or swallowing problems. Negative for dental complaints.   Eyes: Negative for vision loss or change in visual acuity.  Respiratory: Negative for chest tightness and wheezing. Negative for DOE.   Cardiovascular: Negative for chest pain or palpitations. No decreased exercise tolerance Gastrointestinal: No change in bowel habit. No bloating or gas. No reflux or indigestion Genitourinary: Negative for urgency, frequency, flank pain and difficulty urinating. Nocturia 2-3 Musculoskeletal: Negative for myalgias, back pain, arthralgias and gait problem.  Neurological: Negative for  dizziness, tremors, weakness and headaches.  Hematological: Negative for adenopathy.  Psychiatric/Behavioral: Negative for behavioral problems and dysphoric mood.   Lab Results  Component Value Date   WBC 10.6* 05/05/2011   HGB 13.7 05/05/2011   HCT 41.4 05/05/2011   PLT 162 05/05/2011   GLUCOSE 99 08/05/2012   CHOL 95 08/05/2012   TRIG 175.0* 08/05/2012   HDL 39.60 08/05/2012   LDLDIRECT 85.1 06/23/2008   LDLCALC 20 08/05/2012        ALT 16 08/05/2012   AST 19 08/05/2012        NA 139 08/05/2012  K 4.2 08/05/2012   CL 99 08/05/2012   CREATININE 1.0 08/05/2012   BUN 17 08/05/2012   CO2 27 08/05/2012   TSH 3.15 08/05/2012   INR 1.4 07/14/2009   HGBA1C 6.5 08/05/2012       Objective:   Physical Exam Filed Vitals:   08/05/12 0925  BP: 106/62  Pulse: 61  Temp: 97.5 F (36.4 C)  Resp: 16   Wt Readings from Last 3 Encounters:  08/05/12 204 lb (92.534 kg)  10/23/11 209 lb (94.802 kg)  07/16/11 198 lb (89.812 kg)   Gen'l: Well nourished well developed white male in no acute distress  HEENT: Head: Normocephalic and atraumatic. Right Ear: External ear normal w/ cerumen. EAC/TM nl. Left Ear: External ear normal. W/ cerumen EAC/TM nl. Nose: Nose normal. Mouth/Throat: Oropharynx is clear and moist. Dentition - native, in good repair. No buccal or palatal lesions. Posterior pharynx clear. Eyes: Conjunctivae and sclera clear. EOM intact. Pupils are equal, round, and reactive to light. Right eye exhibits no discharge. Left eye exhibits no discharge. Neck: Normal range of motion. Neck supple. No JVD present. No tracheal deviation present. No thyromegaly present.  Cardiovascular: Normal rate, regular rhythm, no gallop, no friction rub, no murmur heard. Well healed sternotomy scar      Quiet precordium. 2+ radial and DP pulses . No carotid bruits Pulmonary/Chest: Effort normal. No respiratory distress or increased WOB, no wheezes, no rales. No chest wall deformity or CVAT. Abdomen: Soft. Bowel sounds  are normal in all quadrants. He exhibits no distension, no tenderness, no rebound or guarding, No heptosplenomegaly  Genitourinary: deferred Musculoskeletal: Normal range of motion. He exhibits no edema and no tenderness.       Small and large joints without redness, synovial thickening or deformity. Full range of motion preserved about all small, median and large joints.  Lymphadenopathy:    He has no cervical or supraclavicular adenopathy.  Neurological: He is alert and oriented to person, place, and time. CN II-XII intact. DTRs 2+ and symmetrical biceps, radial and patellar tendons. Cerebellar function normal with no tremor, rigidity, normal gait and station.  Skin: Skin is warm and dry. No rash noted. No erythema.  Psychiatric: He has a normal mood and affect. His behavior is normal. Thought content normal.         Assessment & Plan:

## 2012-08-05 NOTE — Assessment & Plan Note (Addendum)
Taking crestor w/o complications.  Plan - lab today with recommendations to follow. Goal is LDL < 80  Addendum - LDL 20!!

## 2012-08-05 NOTE — Assessment & Plan Note (Signed)
Patient does report nocturia x 2-3 on medication but is able to live with this.

## 2012-08-05 NOTE — Assessment & Plan Note (Signed)
BP Readings from Last 3 Encounters:  08/05/12 106/62  10/23/11 129/74  07/16/11 110/60   Good control on present medications

## 2012-08-05 NOTE — Assessment & Plan Note (Addendum)
Interval history is benign. Physical exam is normal. Labs pending. Current with colorectal cancer screening and immunizations are up to date  In summary - a very nice man who is medically stable and current with health maintenance. He will see cardiology in Dec '1`3 and return to IM in 6 months.

## 2012-08-05 NOTE — Assessment & Plan Note (Addendum)
For A1C today with recommendations to follow.  Addendum - A1C better than goal of 7%or less

## 2012-08-05 NOTE — Assessment & Plan Note (Addendum)
No recent flares  Plan - Uric acid level today with recommendations re: prophylaxis based on results  Addendum - Uric Acid 6.1 - no need for prophylaxis unless he has >3 attacks/12 months

## 2012-08-05 NOTE — Assessment & Plan Note (Signed)
Last colonoscopy '10 - normal. Due for 10 year follow up 2020

## 2012-08-09 NOTE — Assessment & Plan Note (Signed)
He has been pain free. No post-operative testing found in EPIC. Last visit with Dr.Nishan Dec '12  Plan -  Continue present medical regimen  See Dr. Johnsie Cancel in Dec '13

## 2012-10-21 ENCOUNTER — Other Ambulatory Visit: Payer: Self-pay | Admitting: Internal Medicine

## 2012-10-23 ENCOUNTER — Ambulatory Visit (INDEPENDENT_AMBULATORY_CARE_PROVIDER_SITE_OTHER): Payer: Medicare Other | Admitting: Cardiovascular Disease

## 2012-10-23 VITALS — BP 119/68 | HR 70 | Wt 204.0 lb

## 2012-10-23 DIAGNOSIS — N400 Enlarged prostate without lower urinary tract symptoms: Secondary | ICD-10-CM

## 2012-10-23 DIAGNOSIS — I2510 Atherosclerotic heart disease of native coronary artery without angina pectoris: Secondary | ICD-10-CM

## 2012-10-23 DIAGNOSIS — I10 Essential (primary) hypertension: Secondary | ICD-10-CM

## 2012-10-23 DIAGNOSIS — E119 Type 2 diabetes mellitus without complications: Secondary | ICD-10-CM

## 2012-10-23 NOTE — Assessment & Plan Note (Signed)
Despite poor diet A1c not bad.  Encouraged moderation in portion size and weight loss

## 2012-10-23 NOTE — Patient Instructions (Signed)
Your physician wants you to follow-up in: YEAR WITH DR NISHAN  You will receive a reminder letter in the mail two months in advance. If you don't receive a letter, please call our office to schedule the follow-up appointment.  Your physician recommends that you continue on your current medications as directed. Please refer to the Current Medication list given to you today. 

## 2012-10-23 NOTE — Progress Notes (Signed)
Patient ID: Ricardo Washington, male   DOB: June 22, 1935, 77 y.o.   MRN: [id number] [name] is seen today post CABG. He was done in 8/10 by Dr Roxan Hockey. He had a lima to lad, svg PDA/PLA and svg OM. His LV functoin is preserved. He is active and will finish cardiac rehab in December. He is a member of a gym in [place] and will continue there for the new year. His BP was a little low at rehab and we decreased his lisinopril to 24m. He has not had any SSCP, palpitations or dizzyness. He has been compliant with his meds. Samples of Crestor were given since he is in the donut hole. He has multiple somatic complaints including lower back pain. He was told it is ok to go out in hot weather, do yard work and golf.  Chol good Eating too much and too much fat.  Still with frequency Encouraged him to see Otelin and have PSA checked  ROS: Denies fever, malais, weight loss, blurry vision, decreased visual acuity, cough, sputum, SOB, hemoptysis, pleuritic pain, palpitaitons, heartburn, abdominal pain, melena, lower extremity edema, claudication, or rash.  All other systems reviewed and negative  General: Affect appropriate Overweight white male HEENT: normal Neck supple with no adenopathy JVP normal no bruits no thyromegaly Lungs clear with no wheezing and good diaphragmatic motion Heart:  S1/S2 no murmur, no rub, gallop or click PMI normal Abdomen: benighn, BS positve, no tenderness, no AAA no bruit.  No HSM or HJR Distal pulses intact with no bruits No edema Neuro non-focal Skin warm and dry No muscular weakness   Current Outpatient Prescriptions  Medication Sig Dispense Refill  . ALPRAZolam (NIRAVAM) 0.25 MG dissolvable tablet Take 0.25 mg by mouth at bedtime as needed.        .Marland Kitchenaspirin 325 MG tablet Take 325 mg by mouth daily.        . finasteride (PROSCAR) 5 MG tablet TAKE 1 TABLET DAILY  90 tablet  3  . indomethacin (INDOCIN) 25 MG capsule Take 1 capsule (25 mg total) by mouth 3 (three) times daily  as needed.      . Lancets (ONETOUCH ULTRASOFT) lancets USE TO TEST BLOOD SUGAR DAILY AS DIRECTED  100 each  3  . lisinopril (PRINIVIL,ZESTRIL) 5 MG tablet TAKE 1 TABLET DAILY  90 tablet  3  . Lysine 500 MG TABS Take by mouth as needed.       . metFORMIN (GLUCOPHAGE-XR) 500 MG 24 hr tablet Take 1 tablet (500 mg total) by mouth 2 (two) times daily.  180 tablet  3  . metoprolol tartrate (LOPRESSOR) 25 MG tablet TAKE 1 TABLET TWICE A DAY  180 tablet  3  . MULTIPLE VITAMIN PO Take 1 tablet by mouth daily.       . psyllium (METAMUCIL SMOOTH TEXTURE) 28 % packet Take 1 packet by mouth 2 (two) times daily.        . rosuvastatin (CRESTOR) 10 MG tablet TAKE 1 TABLET DAILY  90 tablet  3  . Tamsulosin HCl (FLOMAX) 0.4 MG CAPS TAKE 1 CAPSULE DAILY  90 capsule  3  . TRUETEST TEST test strip TEST 2 TIMES A DAY  200 each  1    Allergies  Sulfonamide derivatives  Electrocardiogram:  NSR rate 70 normal ECG  Assessment and Plan

## 2012-10-23 NOTE — Assessment & Plan Note (Signed)
Stable with no angina and good activity level.  Continue medical Rx  

## 2012-10-23 NOTE — Assessment & Plan Note (Signed)
Well controlled.  Continue current medications and low sodium Dash type diet.    

## 2012-10-23 NOTE — Assessment & Plan Note (Signed)
Encouraged him to get PSA checked and see Dr Edwena Blow ? Change alpha blocker

## 2012-12-23 ENCOUNTER — Other Ambulatory Visit: Payer: Self-pay | Admitting: *Deleted

## 2012-12-23 MED ORDER — ONETOUCH ULTRASOFT LANCETS MISC: Status: DC

## 2012-12-23 NOTE — Telephone Encounter (Signed)
Rx faxed to [facility] in [facility].

## 2013-02-16 ENCOUNTER — Ambulatory Visit (INDEPENDENT_AMBULATORY_CARE_PROVIDER_SITE_OTHER): Payer: Medicare Other | Admitting: Internal Medicine

## 2013-02-16 ENCOUNTER — Encounter: Payer: Self-pay | Admitting: Internal Medicine

## 2013-02-16 VITALS — BP 122/70 | HR 64 | Temp 97.5°F | Resp 12 | Ht 66.0 in | Wt 204.0 lb

## 2013-02-16 DIAGNOSIS — I10 Essential (primary) hypertension: Secondary | ICD-10-CM

## 2013-02-16 DIAGNOSIS — N400 Enlarged prostate without lower urinary tract symptoms: Secondary | ICD-10-CM

## 2013-02-16 DIAGNOSIS — E119 Type 2 diabetes mellitus without complications: Secondary | ICD-10-CM

## 2013-02-16 DIAGNOSIS — R413 Other amnesia: Secondary | ICD-10-CM | POA: Insufficient documentation

## 2013-02-16 DIAGNOSIS — E785 Hyperlipidemia, unspecified: Secondary | ICD-10-CM

## 2013-02-16 DIAGNOSIS — I2510 Atherosclerotic heart disease of native coronary artery without angina pectoris: Secondary | ICD-10-CM

## 2013-02-16 NOTE — Progress Notes (Signed)
Subjective:    Patient ID: Ricardo Washington, male    DOB: 17-Jan-1935, 78 y.o.   MRN: [id number]  HPI Ricardo Washington presents for 6 months follow up.  He is concerned about short term memory loss - dementia.  He had an episode about 1 month ago after coming home from [place] dinner he developed RIGORs that lasted for several hours and then abated. He had no abdominal pain, nausea/diarrhea.   Allergic rhinitis - he has been taking loratadine for runny nose but it only last 3-4 hours.  Preparing house for sale and plan to move to [place], [place] which is causing stress.   Past Medical History  Diagnosis Date  . Cataract   . Hypertrophy of prostate without urinary obstruction and other lower urinary tract symptoms (LUTS)   . S/P coronary artery bypass graft x 3   . Coronary artery disease   . Fatigue   . Past use of tobacco   . Diverticulosis of colon     Adenomatous '2002  . History of colonic polyps   . Rectal bleeding   . Other and unspecified hyperlipidemia   . Hypogonadism male   . Gout    Past Surgical History  Procedure Laterality Date  . Tonsillectomy    . Vasectomy    . Umbilical hernia repair      2009  . Cataract extraction, bilateral    . Ptca    . Median sternotomy extracorporel circulation coronary    . Descending, saphenous vein graft to first obtuse marginal, sequential    . Saphenous vein graft to posterior descending and posterial lateral    . Endoscopic vein harvest right thigh     Family History  Problem Relation Age of Onset  . Cancer Mother   . Cancer Father    History   Social History  . Marital Status: Married    Spouse Name: N/A    Number of Children: 3  . Years of Education: 16   Occupational History  . Not on file.   Social History Main Topics  . Smoking status: Former Smoker -- 1.00 packs/day for 30 years    Types: Cigarettes    Quit date: 03/15/1983  . Smokeless tobacco: Not on file     Comment: Quit 1984  . Alcohol Use: No   Comment: Quit 1999  . Drug Use: No  . Sexually Active: No   Other Topics Concern  . Not on file   Social History Narrative   [employer]- Associate Professor. 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 Married 76. 3 sons- '60, '61, '64; grandchildren 84. Marriage in good health - getting along. End of life Care: DNR, no prolonged heroic measures or prolonged supportive care. Provided packet on end of life along with unsigned MOST. (03/15/11)        Current Outpatient Prescriptions on File Prior to Visit  Medication Sig Dispense Refill  . ALPRAZolam (NIRAVAM) 0.25 MG dissolvable tablet Take 0.25 mg by mouth at bedtime as needed.        Marland Kitchen aspirin 325 MG tablet Take 325 mg by mouth daily.        . finasteride (PROSCAR) 5 MG tablet TAKE 1 TABLET DAILY  90 tablet  3  . indomethacin (INDOCIN) 25 MG capsule Take 1 capsule (25 mg total) by mouth 3 (three) times daily as needed.      . Lancets (ONETOUCH ULTRASOFT) lancets USE TO TEST BLOOD SUGAR 1 TIME DAILY  AS DIRECTED.  100 each  3  . lisinopril (PRINIVIL,ZESTRIL) 5 MG tablet TAKE 1 TABLET DAILY  90 tablet  3  . Lysine 500 MG TABS Take by mouth as needed.       . metFORMIN (GLUCOPHAGE-XR) 500 MG 24 hr tablet Take 1 tablet (500 mg total) by mouth 2 (two) times daily.  180 tablet  3  . metoprolol tartrate (LOPRESSOR) 25 MG tablet TAKE 1 TABLET TWICE A DAY  180 tablet  3  . MULTIPLE VITAMIN PO Take 1 tablet by mouth daily.       . psyllium (METAMUCIL SMOOTH TEXTURE) 28 % packet Take 1 packet by mouth 2 (two) times daily.        . rosuvastatin (CRESTOR) 10 MG tablet TAKE 1 TABLET DAILY  90 tablet  3  . Tamsulosin HCl (FLOMAX) 0.4 MG CAPS TAKE 1 CAPSULE DAILY  90 capsule  3  . TRUETEST TEST test strip TEST 2 TIMES A DAY  200 each  1   No current facility-administered medications on file prior to visit.      Review of Systems System review is negative for any constitutional, cardiac, pulmonary, GI or neuro  symptoms or complaints other than as described in the HPI.     Objective:   Physical Exam Filed Vitals:   02/16/13 1036  BP: 122/70  Pulse: 64  Temp: 97.5 F (36.4 C)  Resp: 12   Wt Readings from Last 3 Encounters:  02/16/13 204 lb (92.534 kg)  10/23/12 204 lb (92.534 kg)  08/05/12 204 lb (92.534 kg)   gen'l- WNWD white man in no distress HEENT- C&S clear Cor - 2+ radial, RRR Pulm - normal respirations. Neuro - A&O x 3, CN II- XII grossly normal MMSE: 1. Day,date,year - ok, ok, ok 2. Content: president-  ok  Gov. -  ok Current events - ok 3. Number repitition: 5 fwd - ok 5 rev -  No, 4 rev no  World reversed - ok 4. 3 word recall -1/3 5. Serial 7's -  ok, nickles in $1.25- slow,ok  Change making - error 6. Naming objects -     ok      4 legged creatures - limited number 7. Parables:  [place] -  ok     [place] -  Ok  8. Judgement:  Letter  ok         Fire  ok 9. Clock face exercise         Assessment & Plan:

## 2013-02-16 NOTE — Assessment & Plan Note (Signed)
Minor abnormalities: 1/3 word recall; slow with some calculations. No indication for medical therapy at this time  Plan Repeat MMSE 6 months.

## 2013-02-16 NOTE — Patient Instructions (Addendum)
Thanks for coming in  You seem to be doing ok. I do not know what happened after the [place] dinner  For the allergies it is ok to continue the loratadine. If this doesn't work we can consider a nasal steroid spray  Memory - did have signs of minor memory loss but not to the degree when I would recommend medication. Need to have a repeat mental status exam in 6 months.   No indication for PSA in a man older than 70. If you have too much of a problem with night-time urination you can consider seeing Dr. Edwena Blow for surgical intervention.

## 2013-02-16 NOTE — Assessment & Plan Note (Signed)
BP Readings from Last 3 Encounters:  02/16/13 122/70  10/23/12 119/68  08/05/12 106/62   Very good control on present medications

## 2013-02-16 NOTE — Assessment & Plan Note (Signed)
Last lipid panel 6 months ago was excellent - LDL 20

## 2013-02-16 NOTE — Assessment & Plan Note (Signed)
He reports continued nocturia x 3-4 despite Tamsulosin and Finasteride  Plan If he is interested in reducing his symptoms he should return to Dr. Edwena Blow  At 76 PSA screening, per ACU guidelines, is not recommended.

## 2013-02-16 NOTE — Assessment & Plan Note (Signed)
Stable and doing well. Saw Dr. Johnsie Cancel in the fall of '13.

## 2013-02-16 NOTE — Assessment & Plan Note (Signed)
Lab Results  Component Value Date   HGBA1C 6.5 08/05/2012   Do for next lab in September '14  Plan Continue metformin  Diet management

## 2013-02-20 ENCOUNTER — Other Ambulatory Visit: Payer: Self-pay

## 2013-02-20 MED ORDER — METOPROLOL TARTRATE 25 MG PO TABS: ORAL_TABLET | ORAL | Status: DC

## 2013-02-20 MED ORDER — FINASTERIDE 5 MG PO TABS: ORAL_TABLET | ORAL | Status: DC

## 2013-02-20 MED ORDER — METFORMIN HCL ER 500 MG PO TB24: 500.0000 mg | ORAL_TABLET | Freq: Two times a day (BID) | ORAL | Status: DC

## 2013-02-20 MED ORDER — LISINOPRIL 5 MG PO TABS: ORAL_TABLET | ORAL | Status: DC

## 2013-02-20 MED ORDER — TAMSULOSIN HCL 0.4 MG PO CAPS: ORAL_CAPSULE | ORAL | Status: DC

## 2013-02-20 MED ORDER — ROSUVASTATIN CALCIUM 10 MG PO TABS: ORAL_TABLET | ORAL | Status: DC

## 2013-02-26 ENCOUNTER — Telehealth: Payer: Self-pay | Admitting: Internal Medicine

## 2013-02-26 MED ORDER — METOPROLOL TARTRATE 25 MG PO TABS: ORAL_TABLET | ORAL | Status: DC

## 2013-02-26 MED ORDER — LISINOPRIL 5 MG PO TABS: ORAL_TABLET | ORAL | Status: DC

## 2013-02-26 NOTE — Telephone Encounter (Signed)
Wife calls today concerned about 2 of several that needed to be filled by [facility].  Pharmacy states has received from office everything to renew the medications except for Lisinopril 5 mg daily and Metoprolol 25 mg  twice daily.  RN noted the dates transmitted was on 02/20/13 to pharmacy.  Please follow up as needed.  Wife states have received the others, but not these two-pharmacy says they do not have prescriptions from office.  Husband has 15 days left of each medication.  [facility] number is [phone].

## 2013-02-26 NOTE — Telephone Encounter (Signed)
Rx's for Lisinopril and Metoprolol called into [facility] pharmacy.

## 2013-04-22 ENCOUNTER — Telehealth: Payer: Self-pay

## 2013-04-22 MED ORDER — GLUCOSE BLOOD VI STRP: ORAL_STRIP | Status: DC

## 2013-04-22 MED ORDER — ONETOUCH ULTRASOFT LANCETS MISC: Status: DC

## 2013-04-22 NOTE — Telephone Encounter (Signed)
Phone call from Ander Purpura at [facility] in [place] [phone] stating pt recently moved there and does not have a pcp there yet. He needs a refill on one touch ultra lancets and strips. Script was faxed to [phone]

## 2013-05-20 ENCOUNTER — Other Ambulatory Visit: Payer: Self-pay | Admitting: *Deleted

## 2013-05-21 ENCOUNTER — Other Ambulatory Visit: Payer: Self-pay | Admitting: *Deleted

## 2013-05-21 MED ORDER — TAMSULOSIN HCL 0.4 MG PO CAPS: ORAL_CAPSULE | ORAL | Status: DC

## 2013-05-21 MED ORDER — METFORMIN HCL ER 500 MG PO TB24: 500.0000 mg | ORAL_TABLET | Freq: Two times a day (BID) | ORAL | Status: DC

## 2013-05-21 MED ORDER — METOPROLOL TARTRATE 25 MG PO TABS: ORAL_TABLET | ORAL | Status: DC

## 2013-05-21 MED ORDER — FINASTERIDE 5 MG PO TABS: ORAL_TABLET | ORAL | Status: DC

## 2013-05-21 MED ORDER — ROSUVASTATIN CALCIUM 10 MG PO TABS: ORAL_TABLET | ORAL | Status: DC

## 2013-05-21 MED ORDER — LISINOPRIL 5 MG PO TABS: ORAL_TABLET | ORAL | Status: DC

## 2013-05-21 NOTE — Telephone Encounter (Signed)
Medications phoned in to pharmacy at [phone].

## 2013-05-25 NOTE — Telephone Encounter (Signed)
A user error has taken place.

## 2013-06-25 ENCOUNTER — Encounter

## 2013-06-25 NOTE — Progress Notes (Signed)
Subjective:      Patient ID: David Beasley is a 78 y.o. male.    HPI  Annual physical exam  Prostate: today  Colonoscopy: 2010. rechx 10 yrs.  DEXA: na  Eye: 09/2012  Hearing: decreased hearing. Not wearing HAs today  Immunization: up to date. Flu shot in the fall(oct/nov).  MMSE: 30/30  Get Up and Go: passed  Tob: nonsmoker/quit 1994/ hx 60+ pk yrs  ETOH: none  Caffeine: heavy  Cardiac Risk Assessment: has CAD  Living will: yes  Medical power of attorney: yes      Coronary atherosclerosis  The pt has reported no chest pain,palpatations,edema,shortness of breath,syncope, or lightheadedness since their last visit. They have not required any prn meds for angina-like c/o. Last cardio visit was 10/2012. S/p CABG 06/2009    Essential hypertension  No change in meds, no c/o with meds, no chest pain, SOB, palpatations, or syncope. Home bp was 100-120s/70-80s.    Gout  No pain,erythema,joint swelling, or tophea. No change in meds. No c/o with meds. Last uric acid test was.. 6.1    Hyperlipidemia  Stable diet. Some wt loss.     Type II diabetes mellitus (HCC)  Sugars are good, diet is stable, weight is down, no reported neuropathy, no change in vision, no claudication, no foot ulcers, no new skin lesions. No change in medications but only taking Metformin once daily w/ good results w/ wt loss. No c/o with meds. Last eye exam 10/2012.      Past Medical History   Diagnosis Date   ??? H/O vasectomy    ??? Hernia, umbilical    ??? Cataract    ??? Hypertrophy of prostate    ??? S/P CABG x 3    ??? CAD (coronary artery disease)    ??? Diverticula, colon    ??? Colon polyps    ??? Rectal bleeding    ??? Hyperlipidemia    ??? Hypogonadism    ??? Gout      Current Outpatient Prescriptions   Medication Sig Dispense Refill   ??? ALPRAZolam (NIRAVAM) 0.25 MG dissolvable tablet Take 0.25 mg by mouth.       ??? aspirin 325 MG tablet Take 325 mg by mouth.       ??? finasteride (PROSCAR) 5 MG tablet        ??? glucose blood VI test strips (ASCENSIA AUTODISC VI;ONE TOUCH  ULTRA TEST VI) strip        ??? ONE TOUCH ULTRASOFT LANCETS MISC        ??? lisinopril (PRINIVIL;ZESTRIL) 5 MG tablet        ??? Lysine 500 MG TABS Take  by mouth.       ??? metFORMIN ER (GLUCOPHAGE-XR) 500 MG XR tablet Take 500 mg by mouth.       ??? metoprolol (LOPRESSOR) 25 MG tablet        ??? Multiple Vitamins-Minerals (MULTIVITAMIN PO) Take  by mouth.       ??? psyllium (METAMUCIL SMOOTH TEXTURE) 28 % packet Take  by mouth.       ??? rosuvastatin (CRESTOR) 10 MG tablet        ??? tamsulosin (FLOMAX) 0.4 MG capsule        ??? glucose blood VI test strips (TRUETEST TEST) strip          No current facility-administered medications for this visit.     Allergies   Allergen Reactions   ??? Sulfa Antibiotics      History  Substance Use Topics   ??? Smoking status: Former Smoker -- 1.00 packs/day     Quit date: 11/19/1992   ??? Smokeless tobacco: Not on file   ??? Alcohol Use: No     Review of Systems   Constitutional: Negative.    HENT: Positive for hearing loss. Negative for ear pain, nosebleeds, congestion, sore throat, facial swelling, rhinorrhea, sneezing, drooling, mouth sores, trouble swallowing, neck pain, neck stiffness, dental problem, voice change, postnasal drip, sinus pressure, tinnitus and ear discharge.    Eyes: Negative.    Respiratory: Negative.    Cardiovascular: Negative.    Gastrointestinal: Negative.    Endocrine: Negative.    Genitourinary: Negative.    Musculoskeletal: Negative.    Skin: Negative.    Allergic/Immunologic: Negative.    Neurological: Negative.    Hematological: Negative.    Psychiatric/Behavioral: Negative.      Filed Vitals:    06/25/13 1238   BP: 120/66   Pulse: 60   Temp: 98 ??F (36.7 ??C)   Resp: 18   Height: 5\' 6"  (1.676 m)   Weight: 192 lb 9.6 oz (87.363 kg)     Objective:   Physical Exam   Constitutional: He is oriented to person, place, and time. Vital signs are normal. He appears well-developed and well-nourished.  Non-toxic appearance. He does not have a sickly appearance. He does not appear ill. No  distress.   obese   HENT:   Head: Normocephalic and atraumatic.   Right Ear: Hearing, tympanic membrane, external ear and ear canal normal.   Left Ear: Hearing, tympanic membrane, external ear and ear canal normal.   Nose: Nose normal. Right sinus exhibits no maxillary sinus tenderness and no frontal sinus tenderness. Left sinus exhibits no maxillary sinus tenderness and no frontal sinus tenderness.   Mouth/Throat: Uvula is midline, oropharynx is clear and moist and mucous membranes are normal. No oropharyngeal exudate.   Eyes: Conjunctivae, EOM and lids are normal. Pupils are equal, round, and reactive to light. Right eye exhibits no discharge. Left eye exhibits no discharge. No scleral icterus.   Fundoscopic exam:       The right eye shows no arteriolar narrowing, no AV nicking, no exudate, no hemorrhage and no papilledema. The right eye shows no red reflex and no venous pulsations.        The left eye shows no arteriolar narrowing, no AV nicking, no exudate, no hemorrhage and no papilledema. The left eye shows no red reflex and no venous pulsations.   Neck: Trachea normal, normal range of motion and full passive range of motion without pain. Neck supple. Normal carotid pulses, no hepatojugular reflux and no JVD present. Carotid bruit is not present. No tracheal deviation present. No thyroid mass and no thyromegaly present.   Cardiovascular: Normal rate, regular rhythm, normal heart sounds, intact distal pulses and normal pulses.   No extrasystoles are present. PMI is not displaced.  Exam reveals no gallop and no friction rub.    No murmur heard.  Pulses:       Carotid pulses are 2+ on the right side, and 2+ on the left side.       Radial pulses are 2+ on the right side, and 2+ on the left side.        Femoral pulses are 2+ on the right side, and 2+ on the left side.       Popliteal pulses are 2+ on the right side, and 2+ on the left side.  Dorsalis pedis pulses are 2+ on the right side, and 2+ on the left  side.        Posterior tibial pulses are 2+ on the right side, and 2+ on the left side.   S/p CABG.   Pulmonary/Chest: Effort normal and breath sounds normal. No accessory muscle usage or stridor. No apnea, no tachypnea and no bradypnea. No respiratory distress. He has no decreased breath sounds. He has no wheezes. He has no rhonchi. He has no rales. He exhibits no tenderness.   Abdominal: Soft. Normal appearance, normal aorta and bowel sounds are normal. He exhibits no shifting dullness, no distension, no pulsatile liver, no fluid wave, no abdominal bruit, no ascites, no pulsatile midline mass and no mass. There is no hepatosplenomegaly. There is no tenderness. There is no rebound, no guarding and no CVA tenderness. No hernia. Hernia confirmed negative in the ventral area, confirmed negative in the right inguinal area and confirmed negative in the left inguinal area.   Genitourinary: Rectum normal, testes normal and penis normal. Guaiac negative stool. Cremasteric reflex is present. No penile tenderness.   Mild BPH w/o nodules.   Musculoskeletal: Normal range of motion. He exhibits no edema or tenderness.   Lymphadenopathy:        Head (right side): No submental, no submandibular, no tonsillar, no preauricular, no posterior auricular and no occipital adenopathy present.        Head (left side): No submental, no submandibular, no tonsillar, no preauricular, no posterior auricular and no occipital adenopathy present.     He has no cervical adenopathy.     He has no axillary adenopathy.        Right: No inguinal, no supraclavicular and no epitrochlear adenopathy present.        Left: No inguinal, no supraclavicular and no epitrochlear adenopathy present.   Neurological: He is alert and oriented to person, place, and time. He has normal strength and normal reflexes. He is not disoriented. He displays no atrophy, no tremor and normal reflexes. No cranial nerve deficit or sensory deficit. He exhibits normal muscle tone.  He displays a negative Romberg sign. He displays no seizure activity. Coordination and gait normal.   Reflex Scores:       Tricep reflexes are 2+ on the right side and 2+ on the left side.       Bicep reflexes are 2+ on the right side and 2+ on the left side.       Brachioradialis reflexes are 2+ on the right side and 2+ on the left side.       Patellar reflexes are 2+ on the right side and 2+ on the left side.       Achilles reflexes are 2+ on the right side and 2+ on the left side.  Skin: Skin is warm, dry and intact. No abrasion and no rash noted. He is not diaphoretic. No cyanosis or erythema. No pallor. Nails show no clubbing.   Psychiatric: He has a normal mood and affect. His speech is normal and behavior is normal. Judgment and thought content normal. Cognition and memory are normal.     Assessment:      Problem   Annual Physical Exam   Coronary Atherosclerosis. stable   History of Colonic Polyps. No new c/o   Type II Diabetes Mellitus (Hcc). Much improved w/ wt loss.   Hyperlipidemia. Good w/ wt loss.   Gout. No recent c/o. Good wt loss.   Essential Hypertension. Stable w/  meds and diet.         Plan:      All above problems reviewed and the found to be unchanged except for the following :   To continue present medication. To improve diet. To lose some weight. Goal is <175 lbs. To increase exercise as tolerated.  Home BP and sugar checks. To call if increase. Will refer to Cardiology and Derm. Flu shot in the fall(oct/nov). Will check labs next visit.

## 2013-06-25 NOTE — Assessment & Plan Note (Signed)
The pt has reported no chest pain,palpatations,edema,shortness of breath,syncope, or lightheadedness since their last visit. They have not required any prn meds for angina-like c/o. Last cardio visit was 10/2012. S/p CABG 06/2009

## 2013-06-25 NOTE — Assessment & Plan Note (Signed)
No pain,erythema,joint swelling, or tophea. No change in meds. No c/o with meds. Last uric acid test was.Marland Kitchen 6.1

## 2013-06-25 NOTE — Assessment & Plan Note (Signed)
Prostate: today  Colonoscopy: 2010. rechx 10 yrs.  DEXA: na  Eye: 09/2012  Hearing: decreased hearing. Not wearing HAs today  Immunization: up to date. Flu shot in the fall(oct/nov).  MMSE: 30/30  Get Up and Go: passed  Tob: nonsmoker/quit 1994/ hx 60+ pk yrs  ETOH: none  Caffeine: heavy  Cardiac Risk Assessment: has CAD  Living will: yes  Medical power of attorney: yes

## 2013-06-25 NOTE — Assessment & Plan Note (Signed)
No change in meds, no c/o with meds, no chest pain, SOB, palpatations, or syncope. Home bp was 100-120s/70-80s.

## 2013-06-25 NOTE — Assessment & Plan Note (Addendum)
Sugars are good, diet is stable, weight is down, no reported neuropathy, no change in vision, no claudication, no foot ulcers, no new skin lesions. No change in medications but only taking Metformin once daily w/ good results w/ wt loss. No c/o with meds. Last eye exam 10/2012.

## 2013-06-25 NOTE — Assessment & Plan Note (Signed)
Stable diet. Some wt loss.

## 2013-06-25 NOTE — Patient Instructions (Signed)
Prostate: today  Colonoscopy: 2010. rechx 10 yrs.  DEXA: na  Eye: 09/2012  Hearing: decreased hearing. Not wearing HAs today  Immunization: up to date. Flu shot in the fall(oct/nov).  MMSE: 30/30  Get Up and Go: passed  Tob: nonsmoker/quit 1994/ hx 60+ pk yrs  ETOH: none  Caffeine: heavy  Cardiac Risk Assessment: has CAD  Living will: yes  Medical power of attorney: yes

## 2013-09-21 NOTE — Telephone Encounter (Signed)
Will discuss at appt

## 2013-09-21 NOTE — Telephone Encounter (Signed)
advisd

## 2013-09-21 NOTE — Telephone Encounter (Signed)
Patient would like to know if there is any reason why he should not joint Silver Sneakers, if no, please sign third page of faxed over pages, thank you

## 2013-09-24 ENCOUNTER — Other Ambulatory Visit: Payer: Self-pay

## 2013-09-24 NOTE — Assessment & Plan Note (Signed)
Last colonoscopy 2010. No new c/o.

## 2013-09-24 NOTE — Assessment & Plan Note (Signed)
No pain,erythema,joint swelling, or tophea. No change in meds. No c/o with meds. Last uric acid test was.Marland Kitchen 6.1

## 2013-09-24 NOTE — Assessment & Plan Note (Signed)
No change in meds, no c/o with meds, no chest pain, SOB, palpatations, or syncope. Home bp was 100-120s/70-80s.

## 2013-09-24 NOTE — Patient Instructions (Signed)
All above problems reviewed and the found to be unchanged except for the following :   To continue present medication. To improve diet. To lose some weight. To increase exercise as tolerated. Home BP checks. Gave Flu shot. Due for labs. Due for DM eye exam. Due to see Cardiology next month. Referred to Dermatologist again for persistent spot l side of face.

## 2013-09-24 NOTE — Assessment & Plan Note (Signed)
The pt has reported no chest pain,palpatations,edema,shortness of breath,syncope, or lightheadedness since their last visit. They have not required any prn meds for angina-like c/o. Last cardio visit was 10/2012. appt next month. S/p CABGx 4 in 06/2009

## 2013-09-24 NOTE — Assessment & Plan Note (Signed)
Stable diet and wt. No c/o w/ meds.

## 2013-09-24 NOTE — Assessment & Plan Note (Signed)
Sugars are good, diet is stable, weight is down, no reported neuropathy, no change in vision, no claudication, no foot ulcers, no new skin lesions. No change in medications but only taking Metformin once daily w/ good results w/ wt loss. No c/o with meds. Last eye exam 10/2012.

## 2013-09-24 NOTE — Progress Notes (Signed)
Subjective:      Patient ID: David Beasley is a 78 y.o. male.    HPI  Type II diabetes mellitus (HCC)  Sugars are good, diet is stable, weight is down, no reported neuropathy, no change in vision, no claudication, no foot ulcers, no new skin lesions. No change in medications but only taking Metformin once daily w/ good results w/ wt loss. No c/o with meds. Last eye exam 10/2012.    Hyperlipidemia  Stable diet and wt. No c/o w/ meds.    Coronary atherosclerosis  The pt has reported no chest pain,palpatations,edema,shortness of breath,syncope, or lightheadedness since their last visit. They have not required any prn meds for angina-like c/o. Last cardio visit was 10/2012. appt next month. S/p CABGx 4 in 06/2009    Essential hypertension  No change in meds, no c/o with meds, no chest pain, SOB, palpatations, or syncope. Home bp was 100-120s/70-80s.    Gout  No pain,erythema,joint swelling, or tophea. No change in meds. No c/o with meds. Last uric acid test was.. 6.1    History of colonic polyps  Last colonoscopy 2010. No new c/o.     Past Medical History   Diagnosis Date   ??? H/O vasectomy    ??? Hernia, umbilical    ??? Cataract    ??? Hypertrophy of prostate    ??? S/P CABG x 3    ??? CAD (coronary artery disease)    ??? Diverticula, colon    ??? Colon polyps    ??? Rectal bleeding    ??? Hyperlipidemia    ??? Hypogonadism    ??? Gout      Current Outpatient Prescriptions   Medication Sig Dispense Refill   ??? ALPRAZolam (NIRAVAM) 0.25 MG dissolvable tablet Take 0.25 mg by mouth.       ??? aspirin 325 MG tablet Take 325 mg by mouth.       ??? finasteride (PROSCAR) 5 MG tablet        ??? glucose blood VI test strips (ASCENSIA AUTODISC VI;ONE TOUCH ULTRA TEST VI) strip        ??? ONE TOUCH ULTRASOFT LANCETS MISC        ??? lisinopril (PRINIVIL;ZESTRIL) 5 MG tablet        ??? Lysine 500 MG TABS Take  by mouth.       ??? metFORMIN ER (GLUCOPHAGE-XR) 500 MG XR tablet Take 500 mg by mouth.       ??? metoprolol (LOPRESSOR) 25 MG tablet        ??? Multiple  Vitamins-Minerals (MULTIVITAMIN PO) Take  by mouth.       ??? psyllium (METAMUCIL SMOOTH TEXTURE) 28 % packet Take  by mouth.       ??? rosuvastatin (CRESTOR) 10 MG tablet        ??? tamsulosin (FLOMAX) 0.4 MG capsule        ??? glucose blood VI test strips (TRUETEST TEST) strip          No current facility-administered medications for this visit.     Allergies   Allergen Reactions   ??? Sulfa Antibiotics      History   Substance Use Topics   ??? Smoking status: Former Smoker -- 1.00 packs/day     Quit date: 11/19/1992   ??? Smokeless tobacco: Not on file   ??? Alcohol Use: No     Review of Systems   Constitutional: Negative.    Respiratory: Negative.    Cardiovascular: Negative.  Gastrointestinal: Negative.    Endocrine: Negative.    Musculoskeletal: Negative.    Allergic/Immunologic: Negative.    Neurological: Negative.    Hematological: Negative.    Psychiatric/Behavioral: Negative.      Filed Vitals:    09/24/13 1101 09/24/13 1133   BP: 92/54 122/64   Pulse: 64    Temp: 98 ??F (36.7 ??C)    Resp: 16    Height: 5\' 6"  (1.676 m)    Weight: 192 lb 6.4 oz (87.272 kg)      Objective:   Physical Exam   Constitutional: He is oriented to person, place, and time. Vital signs are normal. He appears well-developed and well-nourished. No distress.   HENT:   Head: Normocephalic and atraumatic.   Neck: Trachea normal, normal range of motion, full passive range of motion without pain and phonation normal. Neck supple. Normal carotid pulses, no hepatojugular reflux and no JVD present. Carotid bruit is not present. No thyroid mass and no thyromegaly present.   Cardiovascular: Normal rate, regular rhythm, normal heart sounds, intact distal pulses and normal pulses.   No extrasystoles are present. PMI is not displaced.  Exam reveals no gallop and no friction rub.    No murmur heard.  Pulses:       Carotid pulses are 2+ on the right side, and 2+ on the left side.       Radial pulses are 2+ on the right side, and 2+ on the left side.        Femoral  pulses are 2+ on the right side, and 2+ on the left side.       Popliteal pulses are 2+ on the right side, and 2+ on the left side.        Dorsalis pedis pulses are 2+ on the right side, and 2+ on the left side.        Posterior tibial pulses are 2+ on the right side, and 2+ on the left side.   Pulmonary/Chest: Effort normal and breath sounds normal. No accessory muscle usage. No apnea, no tachypnea and no bradypnea. No respiratory distress. He has no decreased breath sounds. He has no wheezes. He has no rhonchi. He has no rales.   Abdominal: Normal appearance and normal aorta. There is no hepatosplenomegaly. There is no CVA tenderness. No hernia. Hernia confirmed negative in the ventral area.   Neurological: He is alert and oriented to person, place, and time. He has normal strength. He is not disoriented. He displays no atrophy and no tremor. No cranial nerve deficit or sensory deficit. He exhibits normal muscle tone. He displays a negative Romberg sign. Coordination normal.   Skin: Skin is warm, dry and intact. No abrasion and no rash noted. He is not diaphoretic. No cyanosis. No pallor. Nails show no clubbing.   Lesion L side of face. Pre-ca.   Psychiatric: He has a normal mood and affect. His speech is normal and behavior is normal. Judgment and thought content normal. Cognition and memory are normal.     Assessment:      Problem   Coronary Atherosclerosis. stable   History of Colonic Polyps. stable   Type II Diabetes Mellitus (Hcc). stable   Hyperlipidemia. stable   Gout. No new c/o.   Essential Hypertension. Good w/ meds.         Plan:      All above problems reviewed and the found to be unchanged except for the following :   To continue present medication.  To improve diet. To lose some weight. To increase exercise as tolerated. Home BP checks. Gave Flu shot. Due for labs. Due for DM eye exam. Due to see Cardiology next month. Referred to Dermatologist again for persistent spot l side of face.

## 2013-09-25 LAB — RENAL FUNCTION PANEL
Albumin: 4.3 g/dL (ref 3.4–5.0)
BUN: 17 mg/dL (ref 7–20)
CO2: 26 mmol/L (ref 21–32)
Calcium: 9.4 mg/dL (ref 8.3–10.6)
Chloride: 102 mmol/L (ref 99–110)
Creatinine: 1 mg/dL (ref 0.8–1.3)
GFR African American: >60 (ref >60)
GFR Non-African American: >60 (ref >60)
Glucose: 107 mg/dL — ABNORMAL HIGH (ref 70–99)
Phosphorus: 3.9 mg/dL (ref 2.5–4.9)
Potassium: 4.6 mmol/L (ref 3.5–5.1)
Sodium: 140 mmol/L (ref 136–145)

## 2013-09-25 LAB — HEPATIC FUNCTION PANEL
ALT: 16 U/L (ref 10–40)
AST: 17 U/L (ref 15–37)
Alkaline Phosphatase: 82 U/L (ref 40–129)
Bilirubin, Direct: <0.2 mg/dL (ref 0.0–0.3)
Bilirubin, Indirect: 0.4 mg/dL (ref 0.0–1.0)
Total Bilirubin: 0.6 mg/dL (ref 0.0–1.0)
Total Protein: 6.8 g/dL (ref 6.4–8.2)

## 2013-09-25 LAB — LIPID PANEL
Cholesterol, Total: 117 mg/dL (ref 0–199)
HDL: 49 mg/dL (ref 40–60)
LDL Calculated: 42 mg/dL (ref <100)
Triglycerides: 130 mg/dL (ref 0–150)
VLDL Cholesterol Calculated: 26 mg/dL

## 2013-09-25 LAB — URIC ACID: Uric Acid, Serum: 6.5 mg/dL (ref 3.5–7.2)

## 2013-09-25 NOTE — Telephone Encounter (Signed)
From: Kingsley Spittle  To: Raynelle Fanning, MD  Sent: 09/24/2013 9:27 PM EST  Subject: Visit Follow-Up Question    I tried to get an appointment with Dr. Selena Batten as you suggested and was told that they were booked out past next April. I explained my condition to no avail. I finally made an appointment with a Dr. Florestine Avers for Jan 8 at 11:45a located on [address]   Do you think I should wait until April to see Dr. Selena Batten or go ahead and see this doctor?

## 2013-09-25 NOTE — Telephone Encounter (Signed)
See Dr Arcola Jansky in 11/2013 ok

## 2013-09-26 LAB — HEMOGLOBIN A1C
Hemoglobin A1C: 6.2 %
eAG: 131.2 mg/dL

## 2013-10-28 NOTE — Progress Notes (Signed)
[facility]   Cardiovascular Evaluation    PATIENT: David Beasley  DATE: 10/28/2013  MRN: <[id number]>  CSN: [id number]  DOB: 1935-03-03      Primary Care Doctor: Raynelle Fanning, MD  Reason for evaluation:   Establish Cardiologist; Hypertension; Hyperlipidemia; and Coronary Artery Disease      Subjective:   History of present illness on initial date of evaluation:   David Beasley is a 78 y.o. patient who presents for cardiology establishment for history of CAD, S/P CABG x4 in 06/2009 in [place] with Dr. Delice Bison, HTN and HLD. Today he reports prior to CABG, he was having SOB with activity. He went to his PCP at that time. He was told he did not have an MI but had blockages that resulted in CABG. He moved to [place], [place] from [place] in May 2014.He denies CP, SOB, dizziness or syncope.       Patient Active Problem List   Diagnosis   ??? Type II diabetes mellitus (HCC)   ??? 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 Postsurgical aortocoronary bypass status   ??? Coronary atherosclerosis   ??? Diverticulosis of colon   ??? Gout   ??? Essential hypertension   ??? Benign prostatic hypertrophy without lower urinary tract symptoms (LUTS)   ??? Testicular hypofunction   ??? Memory loss   ??? Hyperlipidemia   ??? Postsurgical percutaneous transluminal coronary angioplasty status   ??? History of colonic polyps   ??? Routine general medical examination at a health care facility   ??? Personal history of tobacco use, presenting hazards to health   ??? Annual physical exam         Past Medical History:   has a past medical history of H/O vasectomy; Hernia, umbilical; Cataract; Hypertrophy of prostate; S/P CABG x 3; CAD (coronary artery disease); Diverticula, colon; Colon polyps; Rectal bleeding; Hyperlipidemia; Hypogonadism; and Gout.    Surgical History:   has past surgical history that includes Tonsillectomy; Percutaneous Transluminal Coronary Angio; Colonoscopy; hernia repair; Cataract removal; and  Coronary artery bypass graft.     Social History:   reports that he quit smoking about 20 years ago. He has never used smokeless tobacco. He reports that he does not drink alcohol or use illicit drugs.     Family History:  No evidence for sudden cardiac death or premature CAD    Home Medications:  Reviewed and are listed in nursing record. and/or listed below  Current Outpatient Prescriptions   Medication Sig Dispense Refill   ??? ALPRAZolam (NIRAVAM) 0.25 MG dissolvable tablet Take 0.25 mg by mouth.       ??? aspirin 325 MG tablet Take 325 mg by mouth.       ??? finasteride (PROSCAR) 5 MG tablet        ??? glucose blood VI test strips (ASCENSIA AUTODISC VI;ONE TOUCH ULTRA TEST VI) strip        ??? ONE TOUCH ULTRASOFT LANCETS MISC        ??? lisinopril (PRINIVIL;ZESTRIL) 5 MG tablet        ??? Lysine 500 MG TABS Take  by mouth.       ??? metFORMIN ER (GLUCOPHAGE-XR) 500 MG XR tablet Take 500 mg by mouth.       ??? metoprolol (LOPRESSOR) 25 MG tablet        ??? Multiple Vitamins-Minerals (MULTIVITAMIN PO) Take  by mouth.       ??? psyllium (METAMUCIL SMOOTH  TEXTURE) 28 % packet Take  by mouth.       ??? rosuvastatin (CRESTOR) 10 MG tablet        ??? tamsulosin (FLOMAX) 0.4 MG capsule        ??? glucose blood VI test strips (TRUETEST TEST) strip          No current facility-administered medications for this visit.        Allergies:  Sulfa antibiotics     Review of Systems:   All 12 point review of symptoms completed. Pertinent positives identified in the HPI, all other review of symptoms negative as below.    Objective:   PHYSICAL EXAM:    Filed Vitals:    10/28/13 1247   BP: 124/62   Pulse: 72    Weight: 191 lb (86.637 kg)     Wt Readings from Last 3 Encounters:   10/28/13 191 lb (86.637 kg)   09/24/13 192 lb 6.4 oz (87.272 kg)   06/25/13 192 lb 9.6 oz (87.363 kg)       General Appearance:  Alert, cooperative, no distress, appears stated age   Head:  Normocephalic, atraumatic   Eyes:  PERRL, conjunctiva/corneas clear   Nose: Nares normal, no  drainage or sinus tenderness   Throat: Lips, mucosa, and tongue normal   Neck: Supple, symmetrical, trachea midline, NL thyroid no carotid bruit or JVD   Lungs:   CTAB, respirations unlabored   Chest Wall:  No tenderness or deformity, well healed surgical scar   Heart:  Regular rhythm and normal rate; S1, S2 are normal;   no murmur noted; no rub or gallop   Abdomen:   Soft, non-tender, +BS x 4, no masses, no organomegaly   Extremities: Extremities normal, atraumatic, no cyanosis or edema   Pulses: 2+ and symmetric   Skin: Skin color, texture, turgor normal, no rashes or lesions   Pysch: Normal mood and affect   Neurologic: Normal gross motor and sensory exam.         LABS   CBC:      No results found for this basename: WBC,  RBC,  HGB,  HCT,  MCV,  RDW,  PLT     CMP:  Lab Results   Component Value Date    NA 140 09/25/2013    K 4.6 09/25/2013    CL 102 09/25/2013    CO2 26 09/25/2013    BUN 17 09/25/2013    CREATININE 1.0 09/25/2013    GFRAA >60 09/25/2013    LABGLOM >60 09/25/2013    GLUCOSE 107 09/25/2013    PROT 6.8 09/25/2013    CALCIUM 9.4 09/25/2013    BILITOT 0.6 09/25/2013    ALKPHOS 82 09/25/2013    AST 17 09/25/2013    ALT 16 09/25/2013     PT/INR:   No components found with this basename: PTPATIENT,  PTINR     Liver:  No components found with this basename: CHLPL     Lab Results   Component Value Date    ALT 16 09/25/2013    AST 17 09/25/2013    ALKPHOS 82 09/25/2013    BILITOT 0.6 09/25/2013     Lab Results   Component Value Date    LABA1C 6.2 09/25/2013     Lipids:         Lab Results   Component Value Date    TRIG 130 09/25/2013            Lab Results   Component  Value Date    HDL 49 09/25/2013            Lab Results   Component Value Date    LDLCALC 42 09/25/2013            Lab Results   Component Value Date    LABVLDL 26 09/25/2013         CARDIAC DATA   EKG:  10/28/2013 NSR, no ischemia or infarcts.      Assessment and Plan   David Beasley is a 78 y.o. male who presents today for the following problems:        1 CAD s/p  4V CABG in [place].  Charlton Haws. Doing very well, no symptoms   ~ Cont ASA, BB, statin, lisinopril   ~ Call [place] office for complete records    2. HTN: Controlled    3. HLD: Well controlled        Plan:  1. No changes warranted at this time  2. Follow up with me in 1 year    It is a pleasure to assist in the care of David Beasley. Please call with any questions.  All questions and concerns were addressed to the patient/family. Alternatives to my treatment were discussed. The note was completed using EMR. Every effort was made to ensure accuracy; however, inadvertent computerized transcription errors may be present.    Alicia Amel, MD, [facility]  Interventional Cardiologist  [facility]  [phone] [facility]  [phone] [place] Office  10/28/2013  1:23 PM

## 2013-11-09 NOTE — Telephone Encounter (Signed)
Patient would like to request refill on Alpramozola 0.25 mg po prn,     [facility] [phone]

## 2013-11-09 NOTE — Telephone Encounter (Signed)
Ok #30, no rf, only use if needed and not daily.

## 2013-11-10 MED ORDER — ALPRAZOLAM 0.25 MG PO TBDP
0.25 MG | ORAL_TABLET | Freq: Every evening | ORAL | Status: DC | PRN
Start: 2013-11-10 — End: 2014-03-29

## 2013-11-20 NOTE — Progress Notes (Signed)
From: Idalia Needle  To: Belia Heman, MD  Sent: 11/19/2013 8:43 AM EST  Subject: Alcohol & Tobacco Use    History questionnaire submitted on Thursday, November 19, 2013 at 8:43:03 AM  Questionnaire: Alcohol & Tobacco Use  Patient: David Beasley [[id number]]      Social History:    Question: Alcohol Use  Response: No    Question: Tobacco Use  Response: Light Smoker  Packs/day: 1  Years: 83  Start Date: 11/19/1952  Quit Date: 01/18/1983  Comments: Quit in 1984

## 2013-11-26 DIAGNOSIS — L820 Inflamed seborrheic keratosis: Secondary | ICD-10-CM | POA: Diagnosis not present

## 2013-11-26 DIAGNOSIS — D492 Neoplasm of unspecified behavior of bone, soft tissue, and skin: Secondary | ICD-10-CM | POA: Diagnosis not present

## 2013-11-26 DIAGNOSIS — L821 Other seborrheic keratosis: Secondary | ICD-10-CM | POA: Diagnosis not present

## 2013-11-26 DIAGNOSIS — L570 Actinic keratosis: Secondary | ICD-10-CM | POA: Diagnosis not present

## 2013-11-26 DIAGNOSIS — D1800 Hemangioma unspecified site: Secondary | ICD-10-CM | POA: Diagnosis not present

## 2013-11-26 NOTE — Progress Notes (Signed)
[facility]  Duffy Bruce, MD  [phone]      Idalia Needle  10-Jul-1935    79 y.o. male     Date of Visit: 11/26/2013    Chief Complaint:   Chief Complaint   Patient presents with   ??? Other     Pt here for a spot on his L upper cheek that he states has been there for over a year and has grown. Pt states its in hairline and aggravating. Pt states his mother had possible NMSC on back of head.       Last seen: New, referred by Dr. Lenon Curt    History of Present Illness:    Here for evaluation of an enlarging lesion on the left temple.  It is been present for a few years but has grown in size.  It is asymptomatic.  He has many roughly scaled asymptomatic macules on his cheeks and forehead.  He notes scattered asymptomatic red and brown lesions on the trunk.    He has never had skin cancer but his mother possibly had a nonmelanoma skin cancer.  No known family history of melanoma.  He defers a complete skin exam today.    Review of Systems:  Gen: Feels well, good sense of health.  Skin: No changing moles or lesions.    Past Medical History, Family History, Surgical History, Medications and Allergies reviewed.    Physical Examination     Gen, well-appearing  The following were examined and determined to be normal: Psych/Neuro, Scalp/hair, Conjunctivae/eyelids, Gums/teeth/lips, Neck, Breast/axilla/chest, Abdomen and Back.  The following were examined and determined to be abnormal: Head/face.     Left temple with ill-defined dull brown macule with focal stuck on dry tan papule at the superior edge  Cheeks and forehead with numerous roughly scaled erythematous macules  Trunk with scattered red papules and dry brown stuck on papules          Assessment and Plan     1. R/o SK, doubt adjacent lentigo/LM  - Shave biopsy. Specimen sent to path. Patient educated regarding risk of bleeding, infection, scar and educated on wound care.     2. AK's  - Discussed treatment options including liquid  nitrogen, PDT, and topical treatment with Efudex/imiquimod  - Elected to proceed with plan for PDT- treat the face - incubate 1 hour  - Discussed procedure, expectations, potential for multiple treatments and typical recovery time and 48 hours of complete sun avoidance  - + History of HSV - will need medication sent in prior to appointment    3.  Angiomas and SKs  - Reassurance provided

## 2013-11-26 NOTE — Patient Instructions (Signed)
Biopsy Wound Care Instructions    ?? Keep the bandage in place for 24 hours.   ?? Cleanse the wound with mild soapy water daily  ??? Gently dry the area.  ??? Apply Vaseline or petroleum jelly to the wound using a cotton tipped applicator.  ??? Cover with a clean bandage.  ??? Repeat this process until the biopsy site is healed.  ??? If you had stitches placed, continue treating the site until the stitches are removed. Remember to make an appointment to return to have your stitches removed by our staff.  ??? You may shower and bathe as usual.       ** Biopsy results generally take around 7 business days to come back.  If you have not heard from us by then, please call the office at [phone] between 8AM and 4PM Monday through Friday.

## 2013-12-01 NOTE — Telephone Encounter (Signed)
L/vm for patient (HIPAA) regarding bx results of:    Lt temple-benign seborrheic keratosis.

## 2013-12-01 NOTE — Telephone Encounter (Signed)
Pt said that he called got results he said he was unsure of what he needed to come back for? He mentioned the blu light and something else.

## 2013-12-02 NOTE — Telephone Encounter (Signed)
Scheduled patient for PDT 12/10/13.

## 2013-12-09 DIAGNOSIS — L570 Actinic keratosis: Secondary | ICD-10-CM | POA: Diagnosis not present

## 2013-12-10 NOTE — Progress Notes (Signed)
Photodynamic Therapy Procedure Note     Diagnosis: Actinic Keratoses    Location:    - Face    Procedure:     The sites of treatment were verified with the patient and the previous progress note.     The area to be treated was cleansed with alcohol (alcohol or acetone).    Levulan Kerastick was applied to the indicated area(s) above. 1 hour (time in hours) later the patient was exposed to blue light via the Blu-U for 16 minutes and 40 seconds.    1 Levulan Kerastick(s) were used.    The patient was provided with appropriate eye protection.      The patient tolerated the procedure well.      There were no immediate complications.     The patient was educated regarding the potential side effects and risks including burning, stinging, erythema, edema, crusting, and dyspigmentation.  The patient was instructed to avoid sun or intense light for 48 hours after the treatment.

## 2013-12-29 DIAGNOSIS — E119 Type 2 diabetes mellitus without complications: Secondary | ICD-10-CM | POA: Diagnosis not present

## 2013-12-29 DIAGNOSIS — I2510 Atherosclerotic heart disease of native coronary artery without angina pectoris: Secondary | ICD-10-CM | POA: Diagnosis not present

## 2013-12-29 DIAGNOSIS — I10 Essential (primary) hypertension: Secondary | ICD-10-CM | POA: Diagnosis not present

## 2013-12-29 DIAGNOSIS — E785 Hyperlipidemia, unspecified: Secondary | ICD-10-CM | POA: Diagnosis not present

## 2013-12-29 NOTE — Progress Notes (Signed)
Subjective:      Patient ID: David SpittleRonald Beasley is a 79 y.o. male.    HPI  Type II diabetes mellitus (HCC)  Sugars are very good (90-120), diet is stable, weight is stable, no reported neuropathy, no change in vision, no claudication, no foot ulcers, no new skin lesions. No change in medications. No c/o with meds. Last eye exam 10/26/2013.    Essential hypertension  No change in meds, no c/o with meds, no chest pain, SOB, palpatations, or syncope. Home bp was 110-120s/60s.    Hyperlipidemia  Stable diet and wt. No c/o w/ meds.     Coronary atherosclerosis  The pt has reported no chest pain,palpatations,edema,shortness of breath,syncope, or lightheadedness since their last visit. They have not required any prn meds for angina-like c/o. Last cardio visit was 10/2013. appt next month. S/p CABGx 4 in 06/2009.    Memory loss  C/o of short term memory. MMSE= 27/30. Good math,dates, 0/3 words at 3 min.     Past Medical History   Diagnosis Date   ??? H/O vasectomy    ??? Hernia, umbilical    ??? Cataract    ??? Hypertrophy of prostate    ??? S/P CABG x 3    ??? CAD (coronary artery disease)    ??? Diverticula, colon    ??? Colon polyps    ??? Rectal bleeding    ??? Hyperlipidemia    ??? Hypogonadism    ??? Gout      Current Outpatient Prescriptions   Medication Sig Dispense Refill   ??? ALPRAZolam (NIRAVAM) 0.25 MG dissolvable tablet Take 1 tablet by mouth nightly as needed for Anxiety.  30 tablet  0   ??? aspirin 325 MG tablet Take 325 mg by mouth.       ??? finasteride (PROSCAR) 5 MG tablet        ??? glucose blood VI test strips (ASCENSIA AUTODISC VI;ONE TOUCH ULTRA TEST VI) strip        ??? ONE TOUCH ULTRASOFT LANCETS MISC        ??? lisinopril (PRINIVIL;ZESTRIL) 5 MG tablet        ??? Lysine 500 MG TABS Take  by mouth.       ??? metFORMIN ER (GLUCOPHAGE-XR) 500 MG XR tablet Take 500 mg by mouth.       ??? metoprolol (LOPRESSOR) 25 MG tablet        ??? Multiple Vitamins-Minerals (MULTIVITAMIN PO) Take  by mouth.       ??? psyllium (METAMUCIL SMOOTH TEXTURE) 28 % packet  Take  by mouth.       ??? rosuvastatin (CRESTOR) 10 MG tablet        ??? tamsulosin (FLOMAX) 0.4 MG capsule        ??? glucose blood VI test strips (TRUETEST TEST) strip          No current facility-administered medications for this visit.     Allergies   Allergen Reactions   ??? Sulfa Antibiotics      History   Substance Use Topics   ??? Smoking status: Former Smoker -- 1.00 packs/day for 30 years     Start date: 11/19/1952     Quit date: 01/18/1983   ??? Smokeless tobacco: Never Used      Comment: Quit in 1984   ??? Alcohol Use: No      Review of Systems   Constitutional: Negative.    Respiratory: Negative.    Cardiovascular: Negative.    Gastrointestinal: Negative.  Endocrine: Negative.    Musculoskeletal: Negative.    Skin: Negative.    Allergic/Immunologic: Negative.    Neurological: Negative.    Hematological: Negative.    Psychiatric/Behavioral: Positive for decreased concentration. Negative for suicidal ideas, hallucinations, behavioral problems, confusion, sleep disturbance, self-injury, dysphoric mood and agitation. The patient is nervous/anxious. The patient is not hyperactive.         MMSE=27/39 w/ 0/3 words at 3 min.     Filed Vitals:    12/29/13 1359   BP: 116/58   Pulse: 64   Temp: 98 ??F (36.7 ??C)   Resp: 18   Height: 5\' 6"  (1.676 m)   Weight: 191 lb 12.8 oz (87 kg)     Objective:   Physical Exam   Constitutional: He is oriented to person, place, and time. Vital signs are normal. He appears well-developed and well-nourished. No distress.   HENT:   Head: Normocephalic and atraumatic.   Right Ear: Decreased hearing is noted.   Left Ear: Decreased hearing is noted.   Eyes: Conjunctivae and EOM are normal. Pupils are equal, round, and reactive to light.   glasses   Neck: Trachea normal, normal range of motion, full passive range of motion without pain and phonation normal. Neck supple. Normal carotid pulses, no hepatojugular reflux and no JVD present. Carotid bruit is not present. No thyroid mass and no thyromegaly  present.   Cardiovascular: Normal rate, regular rhythm, normal heart sounds, intact distal pulses and normal pulses.   No extrasystoles are present. PMI is not displaced.  Exam reveals no gallop and no friction rub.    No murmur heard.  Pulses:       Carotid pulses are 2+ on the right side, and 2+ on the left side.       Radial pulses are 2+ on the right side, and 2+ on the left side.        Femoral pulses are 2+ on the right side, and 2+ on the left side.       Popliteal pulses are 2+ on the right side, and 2+ on the left side.        Dorsalis pedis pulses are 2+ on the right side, and 2+ on the left side.        Posterior tibial pulses are 2+ on the right side, and 2+ on the left side.   Pulmonary/Chest: Effort normal and breath sounds normal. No accessory muscle usage. No apnea, no tachypnea and no bradypnea. No respiratory distress. He has no decreased breath sounds. He has no wheezes. He has no rhonchi. He has no rales.   Abdominal: Normal appearance and normal aorta. There is no hepatosplenomegaly. There is no CVA tenderness. No hernia. Hernia confirmed negative in the ventral area.   Musculoskeletal: He exhibits tenderness. He exhibits no edema.   Neurological: He is alert and oriented to person, place, and time. He has normal strength. He is not disoriented. He displays no atrophy and no tremor. No cranial nerve deficit or sensory deficit. He exhibits normal muscle tone. He displays a negative Romberg sign. Coordination normal.   Skin: Skin is warm, dry and intact. No abrasion and no rash noted. He is not diaphoretic. No cyanosis or erythema. No pallor. Nails show no clubbing.   Psychiatric: He has a normal mood and affect. His speech is normal and behavior is normal. Judgment and thought content normal. Cognition and memory are normal.     Assessment:      Problem  Coronary Atherosclerosis. stable   Type II Diabetes Mellitus (Hcc). stable   Hyperlipidemia. stable   Essential Hypertension. Good w/ meds.    Memory Loss. MMSE=27/30.         Plan:      All above problems reviewed and the found to be unchanged except for the following :   To continue present medication. To improve diet. To lose some weight. To increase exercise as tolerated. Discussed meds for memory. Pt declines at present. To call if changes mind. Due for labs.

## 2013-12-29 NOTE — Assessment & Plan Note (Signed)
Stable diet and wt. No c/o w/ meds.

## 2013-12-29 NOTE — Assessment & Plan Note (Signed)
The pt has reported no chest pain,palpatations,edema,shortness of breath,syncope, or lightheadedness since their last visit. They have not required any prn meds for angina-like c/o. Last cardio visit was 10/2013. appt next month. S/p CABGx 4 in 06/2009.

## 2013-12-29 NOTE — Assessment & Plan Note (Signed)
Sugars are very good (90-120), diet is stable, weight is stable, no reported neuropathy, no change in vision, no claudication, no foot ulcers, no new skin lesions. No change in medications. No c/o with meds. Last eye exam 10/26/2013.

## 2013-12-29 NOTE — Assessment & Plan Note (Signed)
C/o of short term memory. MMSE= 27/30. Good math,dates, 0/3 words at 3 min.

## 2013-12-29 NOTE — Assessment & Plan Note (Signed)
No change in meds, no c/o with meds, no chest pain, SOB, palpatations, or syncope. Home bp was 110-120s/60s.

## 2013-12-29 NOTE — Patient Instructions (Signed)
All above problems reviewed and the found to be unchanged except for the following :   To continue present medication. To improve diet. To lose some weight. To increase exercise as tolerated. Discussed meds for memory. Pt declines at present. To call if changes mind. Due for labs.

## 2013-12-30 DIAGNOSIS — I10 Essential (primary) hypertension: Secondary | ICD-10-CM | POA: Diagnosis not present

## 2013-12-30 DIAGNOSIS — E785 Hyperlipidemia, unspecified: Secondary | ICD-10-CM | POA: Diagnosis not present

## 2013-12-30 LAB — HEPATIC FUNCTION PANEL
ALT: 16 U/L (ref 10–40)
AST: 19 U/L (ref 15–37)
Alkaline Phosphatase: 71 U/L (ref 40–129)
Bilirubin, Direct: <0.2 mg/dL (ref 0.0–0.3)
Bilirubin, Indirect: 0.2 mg/dL (ref 0.0–1.0)
Total Bilirubin: 0.4 mg/dL (ref 0.0–1.0)
Total Protein: 6.8 g/dL (ref 6.4–8.2)

## 2013-12-30 LAB — LIPID PANEL
Cholesterol, Total: 118 mg/dL (ref 0–199)
HDL: 47 mg/dL (ref 40–60)
LDL Calculated: 50 mg/dL (ref <100)
Triglycerides: 104 mg/dL (ref 0–150)
VLDL Cholesterol Calculated: 21 mg/dL

## 2013-12-30 LAB — RENAL FUNCTION PANEL
Albumin: 4.1 g/dL (ref 3.4–5.0)
Anion Gap: 16 (ref 3–16)
BUN: 15 mg/dL (ref 7–20)
CO2: 24 mmol/L (ref 21–32)
Calcium: 8.9 mg/dL (ref 8.3–10.6)
Chloride: 103 mmol/L (ref 99–110)
Creatinine: 0.9 mg/dL (ref 0.8–1.3)
GFR African American: >60 (ref >60)
GFR Non-African American: >60 (ref >60)
Glucose: 107 mg/dL — ABNORMAL HIGH (ref 70–99)
Phosphorus: 4 mg/dL (ref 2.5–4.9)
Potassium: 4.6 mmol/L (ref 3.5–5.1)
Sodium: 143 mmol/L (ref 136–145)

## 2013-12-31 LAB — HEMOGLOBIN A1C
Hemoglobin A1C: 6.4 %
eAG: 137 mg/dL

## 2014-01-11 MED ORDER — DONEPEZIL HCL 5 MG PO TABS
5 MG | ORAL_TABLET | ORAL | Status: DC
Start: 2014-01-11 — End: 2014-03-29

## 2014-01-11 NOTE — Telephone Encounter (Signed)
aricept 5 mg daily for 2 weeks then 10 mg daily

## 2014-01-11 NOTE — Telephone Encounter (Signed)
Patient was seen by Dr. Eilleen Kempf on 12/29/13 and a medication for memory issues was discussed. The patient has decided he wants to try it. Please advise.    David Beasley, [place]

## 2014-01-11 NOTE — Telephone Encounter (Signed)
Note in 2/10 did not mention which med - please send a new rx to [facility]

## 2014-01-11 NOTE — Telephone Encounter (Signed)
Discussed w spouse please sign rx

## 2014-01-12 NOTE — Telephone Encounter (Signed)
Verbal order given for the 10mg  daily tabs

## 2014-01-12 NOTE — Telephone Encounter (Signed)
Airsept was requested, to take 5 mg for 2 weeks then increase to 2 tablets a day.  Insurance will not cover it written this way.  He needs a 5 mg for 90 days, or 10 mg for 90 days.  Advise how you want this changed.

## 2014-02-04 DIAGNOSIS — L820 Inflamed seborrheic keratosis: Secondary | ICD-10-CM | POA: Diagnosis not present

## 2014-02-04 DIAGNOSIS — L821 Other seborrheic keratosis: Secondary | ICD-10-CM | POA: Diagnosis not present

## 2014-02-04 DIAGNOSIS — D489 Neoplasm of uncertain behavior, unspecified: Secondary | ICD-10-CM | POA: Diagnosis not present

## 2014-02-04 DIAGNOSIS — L259 Unspecified contact dermatitis, unspecified cause: Secondary | ICD-10-CM | POA: Diagnosis not present

## 2014-02-04 DIAGNOSIS — L570 Actinic keratosis: Secondary | ICD-10-CM | POA: Diagnosis not present

## 2014-02-04 NOTE — Patient Instructions (Signed)
Biopsy Wound Care Instructions    ?? Keep the bandage in place for 24 hours.   ?? Cleanse the wound with mild soapy water daily  ??? Gently dry the area.  ??? Apply Vaseline or petroleum jelly to the wound using a cotton tipped applicator.  ??? Cover with a clean bandage.  ??? Repeat this process until the biopsy site is healed.  ??? If you had stitches placed, continue treating the site until the stitches are removed. Remember to make an appointment to return to have your stitches removed by our staff.  ??? You may shower and bathe as usual.       ** Biopsy results generally take around 7 business days to come back.  If you have not heard from us by then, please call the office at [phone] between 8AM and 4PM Monday through Friday.    Cryosurgery (Freezing) Wound Care Instructions    AFTER THE PROCEDURE:   ??? You will notice swelling and redness around the site. This is normal.   ??? You may experience a sharp or sore feeling for the next several days. For this discomfort, you may take acetaminophen (Tylenol??).   ??? A blister may develop at the treated area, sometimes as soon as by the end of the day. After several days, the blister will subside and a scab will form.   ??? If the area is bumped or traumatized during the first few days following freezing, you may develop bleeding into the blister, forming a blood blister. This is nothing to be alarmed about.  ??? If the blister is tense, uncomfortable, or much larger than the site that was frozen, you may pop the blister along its edge with a sterile needle (boiled, heated under a flame, or cleaned with alcohol) to allow the fluid to drain out. If the blister does not bother you, no treatment is needed.   ??? Do NOT peel off the top of the blister roof. It will act as a dressing on top of your wound.   WOUND CARE:   ??? You may shower or bathe as usual, but avoid scrubbing the areas that have been frozen.    ??? Cleanse the site twice a day with mild soapy water, and then apply a thin  film of white petrolatum (Vaseline??).   ??? You do not need to cover the area, but can if you prefer.   ??? Do NOT allow the site to become dry or crusted, or attempt to dry it out with rubbing alcohol or hydrogen peroxide.   ??? Continue this regimen until the area is pink and healed. Depending on the size and location of your cryosurgery site, healing may take 2 to 4 weeks.   ??? The area may continue to be pink for several weeks, and over the next few months may become darker or lighter than the surrounding skin. This may be a permanent change.   ???

## 2014-02-04 NOTE — Progress Notes (Signed)
[facility]  Duffy Bruce, MD  [phone]      David Beasley  1935/05/11    79 y.o. male     Date of Visit: 02/04/2014    Chief Complaint:   Chief Complaint   Patient presents with   ??? Actinic Keratosis     follow up PDT 1/21     Last seen: January 2015    History of Present Illness:    1, 2. He is here for follow-up after PDT treatment in January.  He notes that he face feels a lot smoother but a new lesion has appeared on his left upper lip since he was last seen.  3.  He also had a lesion on the left temple biopsied at last visit and this was read as an inflamed SK.  He would like to have the rest removed.    4.  He notes a persistent lesion on the right proximal lateral shin that is been present for many months.  He notes that initially he scraped the area and made it bleed and admits to frequently picking or scratching of this area sense.      He had a full skin check in his last visit in January.  He has never had skin cancer but his mother possibly had a nonmelanoma skin cancer.  No known family history of melanoma.  He defers a complete skin exam today.    Review of Systems:  Gen: Feels well, good sense of health.    Past Medical History, Family History, Surgical History, Medications and Allergies reviewed.    Physical Examination     Gen, well-appearing  The following were examined and determined to be normal: Psych/Neuro, Scalp/hair, Conjunctivae/eyelids, Gums/teeth/lips, Neck, Breast/axilla/chest, Abdomen and Back.  The following were examined and determined to be abnormal: Head/face.     Left temple with stuck on dry tan to pinkish papule  Left upper lip with erythematous crusted shiny 3 mm papule  Baseline photos of the cheeks demonstrate AK's - in the media section.  He has significantly fewer scattered roughly scaled erythematous macules on the cheeks and right forehead today.  Right upper lateral shin with ill-defined scaly slightly lichenified pinkish patch/thin  plaque    Assessment and Plan     1. Rule out [place] on the left upper lip  - Shave biopsy. Specimen sent to path. Patient educated regarding risk of bleeding, infection, scar and educated on wound care.      2. Scattered remaining AK's but markedly improved post PDT  - lesion(s) treated with liquid nitrogen x 2 cycles. Patient educated on risk of blister, hypopigmentation/scar and wound care.   - Consider PDT again this fall/winter  + History of HSV - will need medication sent in prior to appointment    3. ISK  - lesion(s) treated with liquid nitrogen x 2 cycles. Patient educated on risk of blister, hypopigmentation/scar and wound care.     4. Locoid and Aquaphor to likely dermatitis over scar on the right upper shin, consider biopsy if not clear within the next few months

## 2014-02-09 NOTE — Telephone Encounter (Signed)
L/vm for patient regarding bx results of:    Lt upper lip-seborrheic keratosis, benign.

## 2014-03-01 MED ORDER — ALPRAZOLAM 0.25 MG PO TABS
0.25 MG | ORAL_TABLET | ORAL | Status: DC
Start: 2014-03-01 — End: 2014-06-29

## 2014-03-01 NOTE — Telephone Encounter (Signed)
Controlled Substances Monitoring: Attestation: The Prescription Monitoring Report for this patient was reviewed today. Corky Sing, LPN)- report placed in your office    Called patient and left message to rtrn call regarding reason for requesting med.

## 2014-03-01 NOTE — Telephone Encounter (Signed)
Done. Take rarely

## 2014-03-01 NOTE — Telephone Encounter (Signed)
3/19

## 2014-03-01 NOTE — Telephone Encounter (Signed)
Patient called back he said he called a month ago and you perscribed the xanax for him did not give a reason why   Pt said he like to keep it on hand for anxiety and to help him sleep

## 2014-03-01 NOTE — Telephone Encounter (Signed)
79 yo on Benzodiazepines. Must state reason for taking prior to each refill as per new state law. Oaars report. If chronic use needs to try to wean. If chronic Anxiety maybe see Psych.

## 2014-03-29 DIAGNOSIS — I10 Essential (primary) hypertension: Secondary | ICD-10-CM | POA: Diagnosis not present

## 2014-03-29 DIAGNOSIS — I2510 Atherosclerotic heart disease of native coronary artery without angina pectoris: Secondary | ICD-10-CM | POA: Diagnosis not present

## 2014-03-29 DIAGNOSIS — E785 Hyperlipidemia, unspecified: Secondary | ICD-10-CM | POA: Diagnosis not present

## 2014-03-29 DIAGNOSIS — E119 Type 2 diabetes mellitus without complications: Secondary | ICD-10-CM | POA: Diagnosis not present

## 2014-03-29 MED ORDER — ROSUVASTATIN CALCIUM 10 MG PO TABS
10 MG | ORAL_TABLET | Freq: Every day | ORAL | Status: DC
Start: 2014-03-29 — End: 2015-02-27

## 2014-03-29 NOTE — Assessment & Plan Note (Signed)
No change in meds, no c/o with meds, no chest pain, SOB, palpatations, or syncope. Home bp was 120s/60-70s.

## 2014-03-29 NOTE — Assessment & Plan Note (Signed)
Stable diet and wt. No new c/o w/ meds.

## 2014-03-29 NOTE — Assessment & Plan Note (Signed)
The pt has reported no chest pain,palpatations,edema,shortness of breath,syncope, or lightheadedness since their last visit. They have not required any prn meds for angina-like c/o. Last cardio visit was 10/2013. appt next month. S/p CABGx 4 in 06/2009.

## 2014-03-29 NOTE — Assessment & Plan Note (Signed)
Sugars are very good (90-120), diet is stable, weight is stable, no reported neuropathy, no change in vision, no claudication, no foot ulcers, no new skin lesions. No change in medications. No c/o with meds. Last eye exam 10/26/2013.

## 2014-03-29 NOTE — Assessment & Plan Note (Signed)
Mild occas retention.

## 2014-03-29 NOTE — Progress Notes (Signed)
Subjective:      Patient ID: David Beasley is a 79 y.o. male.    HPI  Type II diabetes mellitus ([place])  Sugars are very good (90-120), diet is stable, weight is stable, no reported neuropathy, no change in vision, no claudication, no foot ulcers, no new skin lesions. No change in medications. No c/o with meds. Last eye exam 10/26/2013.    Essential hypertension  No change in meds, no c/o with meds, no chest pain, SOB, palpatations, or syncope. Home bp was 120s/60-70s.     Coronary atherosclerosis  The pt has reported no chest pain,palpatations,edema,shortness of breath,syncope, or lightheadedness since their last visit. They have not required any prn meds for angina-like c/o. Last cardio visit was 10/2013. appt next month. S/p CABGx 4 in 06/2009.    Hyperlipidemia  Stable diet and wt. No new c/o w/ meds.     BPH (benign prostatic hypertrophy) with urinary retention  Mild occas retention.     Past Medical History   Diagnosis Date   ??? H/O vasectomy    ??? Hernia, umbilical    ??? Cataract    ??? Hypertrophy of prostate    ??? S/P CABG x 3    ??? CAD (coronary artery disease)    ??? Diverticula, colon    ??? Colon polyps    ??? Rectal bleeding    ??? Hyperlipidemia    ??? Hypogonadism    ??? Gout      Current Outpatient Prescriptions   Medication Sig Dispense Refill   ??? ALPRAZolam (XANAX) 0.25 MG tablet TAKE ONE TABLET BY MOUTH ONCE NIGHTLY AS NEEDED FOR ANXIETY  30 tablet  0   ??? donepezil (ARICEPT ODT) 10 MG disintegrating tablet Take 10 mg by mouth nightly.       ??? aspirin 325 MG tablet Take 325 mg by mouth.       ??? finasteride (PROSCAR) 5 MG tablet        ??? glucose blood VI test strips (ASCENSIA AUTODISC VI;ONE TOUCH ULTRA TEST VI) strip        ??? ONE TOUCH ULTRASOFT LANCETS MISC        ??? lisinopril (PRINIVIL;ZESTRIL) 5 MG tablet        ??? Lysine 500 MG TABS Take  by mouth.       ??? metFORMIN ER (GLUCOPHAGE-XR) 500 MG XR tablet Take 500 mg by mouth.       ??? metoprolol (LOPRESSOR) 25 MG tablet        ??? Multiple Vitamins-Minerals (MULTIVITAMIN  PO) Take  by mouth.       ??? psyllium (METAMUCIL SMOOTH TEXTURE) 28 % packet Take  by mouth.       ??? rosuvastatin (CRESTOR) 10 MG tablet        ??? tamsulosin (FLOMAX) 0.4 MG capsule        ??? glucose blood VI test strips (TRUETEST TEST) strip          No current facility-administered medications for this visit.     Allergies   Allergen Reactions   ??? Sulfa Antibiotics      History   Substance Use Topics   ??? Smoking status: Former Smoker -- 1.00 packs/day for 30 years     Start date: 11/19/1952     Quit date: 01/18/1983   ??? Smokeless tobacco: Never Used      Comment: Quit in 1984   ??? Alcohol Use: No     Review of Systems   Constitutional: Negative.  Respiratory: Negative.    Cardiovascular: Negative.    Gastrointestinal: Negative.    Endocrine: Negative.    Musculoskeletal: Negative.    Allergic/Immunologic: Negative.    Neurological: Negative.    Hematological: Negative.    Psychiatric/Behavioral: Negative.      Filed Vitals:    03/29/14 1342 03/29/14 1416   BP: 120/68 124/68   Pulse: 64    Temp: 97.5 ??F (36.4 ??C)    Resp: 18    Height: 5\' 6"  (1.676 m)    Weight: 192 lb (87.091 kg)      Objective:   Physical Exam   Constitutional: He is oriented to person, place, and time. Vital signs are normal. He appears well-developed and well-nourished. No distress.   HENT:   Head: Normocephalic and atraumatic.   Neck: Trachea normal, normal range of motion, full passive range of motion without pain and phonation normal. Neck supple. Normal carotid pulses, no hepatojugular reflux and no JVD present. Carotid bruit is not present. No thyroid mass and no thyromegaly present.   Cardiovascular: Normal rate, regular rhythm, normal heart sounds, intact distal pulses and normal pulses.   No extrasystoles are present. PMI is not displaced.  Exam reveals no gallop and no friction rub.    No murmur heard.  Pulses:       Carotid pulses are 2+ on the right side, and 2+ on the left side.       Radial pulses are 2+ on the right side, and 2+ on  the left side.        Femoral pulses are 2+ on the right side, and 2+ on the left side.       Popliteal pulses are 2+ on the right side, and 2+ on the left side.        Dorsalis pedis pulses are 2+ on the right side, and 2+ on the left side.        Posterior tibial pulses are 2+ on the right side, and 2+ on the left side.   Pulmonary/Chest: Effort normal and breath sounds normal. No accessory muscle usage. No apnea, no tachypnea and no bradypnea. No respiratory distress. He has no decreased breath sounds. He has no wheezes. He has no rhonchi. He has no rales.   Abdominal: Normal appearance and normal aorta. There is no hepatosplenomegaly. There is no CVA tenderness. No hernia. Hernia confirmed negative in the ventral area.   Musculoskeletal: Normal range of motion. He exhibits no edema or tenderness.   Neurological: He is alert and oriented to person, place, and time. He has normal strength. He is not disoriented. He displays no atrophy and no tremor. No cranial nerve deficit or sensory deficit. He exhibits normal muscle tone. He displays a negative Romberg sign. Coordination normal.   Skin: Skin is warm, dry and intact. No abrasion and no rash noted. He is not diaphoretic. No cyanosis. No pallor. Nails show no clubbing.   Psychiatric: He has a normal mood and affect. His speech is normal and behavior is normal. Judgment and thought content normal. Cognition and memory are normal.   MMSE=28/30. Says some improvement w/ Aricept 10 mg     Assessment:      Problem   Bph (Benign Prostatic Hypertrophy) With Urinary Retention. stable   Coronary Atherosclerosis. stable   Type II Diabetes Mellitus (Hcc). stable   Hyperlipidemia. Stable.   Essential Hypertension. stable   History of Colonic Polyps. No new c/o.       Plan:  All above problems reviewed and the found to be unchanged except for the following :   To continue present medication. To improve diet. To lose some weight. To increase exercise as tolerated.  Home BP  checks. To call if new c/o.

## 2014-03-30 LAB — RENAL FUNCTION PANEL
Albumin: 4.3 g/dL (ref 3.4–5.0)
Anion Gap: 19 — ABNORMAL HIGH (ref 3–16)
BUN: 15 mg/dL (ref 7–20)
CO2: 23 mmol/L (ref 21–32)
Calcium: 9.6 mg/dL (ref 8.3–10.6)
Chloride: 104 mmol/L (ref 99–110)
Creatinine: 1 mg/dL (ref 0.8–1.3)
GFR African American: >60 (ref >60)
GFR Non-African American: >60 (ref >60)
Glucose: 88 mg/dL (ref 70–99)
Phosphorus: 4.1 mg/dL (ref 2.5–4.9)
Potassium: 5.2 mmol/L — ABNORMAL HIGH (ref 3.5–5.1)
Sodium: 146 mmol/L — ABNORMAL HIGH (ref 136–145)

## 2014-03-30 LAB — HEMOGLOBIN A1C
Hemoglobin A1C: 6.2 %
eAG: 131.2 mg/dL

## 2014-04-29 ENCOUNTER — Telehealth

## 2014-04-29 NOTE — Telephone Encounter (Signed)
Discussed w spouse ordered test

## 2014-04-29 NOTE — Telephone Encounter (Signed)
Stool cultures. Imodium BID.

## 2014-04-29 NOTE — Telephone Encounter (Signed)
Wife reports pt having epsiodes of diarrhea.  Denies n/v/f blood  Has had few epsidoes of not making it to bathroom in time. Taking metamucil as directed. Imodium helps with episodes but this keeps reocurring off and on.

## 2014-04-29 NOTE — Telephone Encounter (Signed)
Family is asking for a call back from Dr. Senaida Ores nurse regarding patient.

## 2014-05-04 DIAGNOSIS — R197 Diarrhea, unspecified: Secondary | ICD-10-CM | POA: Diagnosis not present

## 2014-05-31 MED ORDER — ONETOUCH ULTRASOFT LANCETS MISC
Status: AC
Start: 2014-05-31 — End: ?

## 2014-05-31 MED ORDER — TAMSULOSIN HCL 0.4 MG PO CAPS
0.4 MG | ORAL_CAPSULE | Freq: Every day | ORAL | Status: DC
Start: 2014-05-31 — End: 2014-06-29

## 2014-05-31 MED ORDER — METOPROLOL TARTRATE 25 MG PO TABS
25 MG | ORAL_TABLET | Freq: Two times a day (BID) | ORAL | Status: DC
Start: 2014-05-31 — End: 2014-06-29

## 2014-05-31 MED ORDER — FINASTERIDE 5 MG PO TABS
5 MG | ORAL_TABLET | Freq: Every day | ORAL | Status: DC
Start: 2014-05-31 — End: 2014-06-29

## 2014-05-31 NOTE — Telephone Encounter (Signed)
8/11

## 2014-06-08 ENCOUNTER — Other Ambulatory Visit: Payer: Self-pay | Admitting: *Deleted

## 2014-06-08 DIAGNOSIS — S61209A Unspecified open wound of unspecified finger without damage to nail, initial encounter: Secondary | ICD-10-CM | POA: Diagnosis not present

## 2014-06-08 MED ORDER — LISINOPRIL 5 MG PO TABS: ORAL_TABLET | ORAL | Status: DC

## 2014-06-29 DIAGNOSIS — Z5189 Encounter for other specified aftercare: Secondary | ICD-10-CM | POA: Diagnosis not present

## 2014-06-29 DIAGNOSIS — E785 Hyperlipidemia, unspecified: Secondary | ICD-10-CM | POA: Diagnosis not present

## 2014-06-29 DIAGNOSIS — I10 Essential (primary) hypertension: Secondary | ICD-10-CM | POA: Diagnosis not present

## 2014-06-29 DIAGNOSIS — E119 Type 2 diabetes mellitus without complications: Secondary | ICD-10-CM | POA: Diagnosis not present

## 2014-06-29 DIAGNOSIS — S61209A Unspecified open wound of unspecified finger without damage to nail, initial encounter: Secondary | ICD-10-CM | POA: Diagnosis not present

## 2014-06-29 DIAGNOSIS — Z951 Presence of aortocoronary bypass graft: Secondary | ICD-10-CM | POA: Diagnosis not present

## 2014-06-29 MED ORDER — LISINOPRIL 5 MG PO TABS
5 MG | ORAL_TABLET | Freq: Every day | ORAL | Status: AC
Start: 2014-06-29 — End: ?

## 2014-06-29 MED ORDER — METFORMIN HCL ER 500 MG PO TB24
500 MG | ORAL_TABLET | Freq: Two times a day (BID) | ORAL | Status: AC
Start: 2014-06-29 — End: ?

## 2014-06-29 MED ORDER — TAMSULOSIN HCL 0.4 MG PO CAPS
0.4 MG | ORAL_CAPSULE | Freq: Every day | ORAL | Status: DC
Start: 2014-06-29 — End: 2015-04-14

## 2014-06-29 MED ORDER — FINASTERIDE 5 MG PO TABS
5 MG | ORAL_TABLET | Freq: Every day | ORAL | Status: DC
Start: 2014-06-29 — End: 2015-04-14

## 2014-06-29 MED ORDER — ALPRAZOLAM 0.25 MG PO TABS
0.25 MG | ORAL_TABLET | Freq: Every evening | ORAL | Status: DC | PRN
Start: 2014-06-29 — End: 2015-03-13

## 2014-06-29 MED ORDER — METOPROLOL TARTRATE 25 MG PO TABS
25 MG | ORAL_TABLET | Freq: Two times a day (BID) | ORAL | Status: DC
Start: 2014-06-29 — End: 2015-04-14

## 2014-06-29 MED ORDER — GLUCOSE BLOOD VI STRP
Status: AC
Start: 2014-06-29 — End: ?

## 2014-06-29 NOTE — Assessment & Plan Note (Signed)
Stable diet w/ mild wt loss. No c/o w/ meds.

## 2014-06-29 NOTE — Assessment & Plan Note (Signed)
No change in meds, no c/o with meds, no chest pain, SOB, palpatations, or syncope. Home bp was 120s/80s.

## 2014-06-29 NOTE — Patient Instructions (Signed)
Stable.

## 2014-06-29 NOTE — Addendum Note (Signed)
Addended by: Lenon Curt on: 06/29/2014 03:18 PM     Modules accepted: Orders

## 2014-06-29 NOTE — Communication Body (Signed)
Assessment:  Problem   Laceration of Left Thumb. Slow healing   Memory Loss. Not on med. No change.   Type II Diabetes Mellitus (Hcc). stable   Hyperlipidemia. Slight wt loss   Essential Hypertension good w/ meds.   Postsurgical Aortocoronary Bypass Status. No new c/o.         Plan:   All above problems reviewed and the found to be unchanged except for the following :   Laceration of Left Thumb. Healing well.   Memory Loss. To take Aricpt 10 mg first thing in am   Type II Diabetes Mellitus (Hcc). Continue meds.    Hyperlipidemia. Continue meds.   Essential Hypertension. Continue meds. Home BP check,   Postsurgical Aortocoronary Bypass Status.to callif.to call if new c/o.

## 2014-06-29 NOTE — Assessment & Plan Note (Signed)
Had CABG in 2010. The pt has reported no chest pain,palpatations,edema,shortness of breath,syncope, or lightheadedness since their last visit. They have not required any prn meds for angina-like c/o. Last cardio visit was 10/2013. appt in 10/2014.

## 2014-06-29 NOTE — Assessment & Plan Note (Signed)
Sugars are good, diet is stable, weight is down 4 lbs, no reported neuropathy, no change in vision, no claudication, no foot ulcers, no new skin lesions. No change in medications. No c/o with meds. Last eye exam 10/26/2013.

## 2014-06-29 NOTE — Progress Notes (Signed)
Subjective:      Patient ID: David Beasley is a 79 y.o. male.    HPI  Type II diabetes mellitus (HCC)  Sugars are good, diet is stable, weight is down 4 lbs, no reported neuropathy, no change in vision, no claudication, no foot ulcers, no new skin lesions. No change in medications. No c/o with meds. Last eye exam 10/26/2013.    Essential hypertension  No change in meds, no c/o with meds, no chest pain, SOB, palpatations, or syncope. Home bp was 120s/80s.    Hyperlipidemia  Stable diet w/ mild wt loss. No c/o w/ meds.     Memory loss  meds makes him not want to sleep. Taking meds at bed time.    Postsurgical aortocoronary bypass status  Had CABG in 2010. The pt has reported no chest pain,palpatations,edema,shortness of breath,syncope, or lightheadedness since their last visit. They have not required any prn meds for angina-like c/o. Last cardio visit was 10/2013. appt in 10/2014.      Past Medical History   Diagnosis Date   ??? H/O vasectomy    ??? Hernia, umbilical    ??? Cataract    ??? Hypertrophy of prostate    ??? S/P CABG x 3    ??? CAD (coronary artery disease)    ??? Diverticula, colon    ??? Colon polyps    ??? Rectal bleeding    ??? Hyperlipidemia    ??? Hypogonadism    ??? Gout      Current Outpatient Prescriptions   Medication Sig Dispense Refill   ??? tamsulosin (FLOMAX) 0.4 MG capsule Take 1 capsule by mouth daily  30 capsule  3   ??? ONE TOUCH ULTRASOFT LANCETS MISC Test blood sugar once daily for diabetes dx 250.00  100 each  3   ??? metoprolol (LOPRESSOR) 25 MG tablet Take 1 tablet by mouth 2 times daily  60 tablet  3   ??? finasteride (PROSCAR) 5 MG tablet Take 1 tablet by mouth daily  30 tablet  3   ??? rosuvastatin (CRESTOR) 10 MG tablet Take 1 tablet by mouth daily  90 tablet  3   ??? ALPRAZolam (XANAX) 0.25 MG tablet TAKE ONE TABLET BY MOUTH ONCE NIGHTLY AS NEEDED FOR ANXIETY  30 tablet  0   ??? donepezil (ARICEPT ODT) 10 MG disintegrating tablet Take 10 mg by mouth nightly.       ??? aspirin 325 MG tablet Take 325 mg by mouth.        ??? glucose blood VI test strips (ASCENSIA AUTODISC VI;ONE TOUCH ULTRA TEST VI) strip        ??? lisinopril (PRINIVIL;ZESTRIL) 5 MG tablet        ??? Lysine 500 MG TABS Take  by mouth.       ??? metFORMIN ER (GLUCOPHAGE-XR) 500 MG XR tablet Take 500 mg by mouth.       ??? Multiple Vitamins-Minerals (MULTIVITAMIN PO) Take  by mouth.       ??? psyllium (METAMUCIL SMOOTH TEXTURE) 28 % packet Take  by mouth.       ??? glucose blood VI test strips (TRUETEST TEST) strip          No current facility-administered medications for this visit.     Allergies   Allergen Reactions   ??? Sulfa Antibiotics      History   Substance Use Topics   ??? Smoking status: Former Smoker -- 1.00 packs/day for 30 years     Start date:  11/19/1952     Quit date: 01/18/1983   ??? Smokeless tobacco: Never Used      Comment: Quit in 1984   ??? Alcohol Use: No     Review of Systems   Constitutional: Negative.    Respiratory: Negative.    Cardiovascular: Negative.    Gastrointestinal: Negative.    Musculoskeletal: Positive for myalgias, back pain, arthralgias and gait problem. Negative for joint swelling, neck pain and neck stiffness.   Skin: Negative.    Allergic/Immunologic: Negative.    Neurological: Negative.    Hematological: Negative.    Psychiatric/Behavioral: Negative.      Filed Vitals:    06/29/14 1443 06/29/14 1509   BP: 120/80 122/80   Pulse: 60    Resp: 16    Height: 5\' 6"  (1.676 m)    Weight: 188 lb 12.8 oz (85.639 kg)      Objective:   Physical Exam   Constitutional: He is oriented to person, place, and time. Vital signs are normal. He appears well-developed and well-nourished. No distress.   HENT:   Head: Normocephalic and atraumatic.   Eyes: Conjunctivae and EOM are normal. Pupils are equal, round, and reactive to light.   Neck: Trachea normal, normal range of motion, full passive range of motion without pain and phonation normal. Neck supple. Normal carotid pulses, no hepatojugular reflux and no JVD present. Carotid bruit is not present. No thyroid  mass and no thyromegaly present.   Cardiovascular: Normal rate, regular rhythm, normal heart sounds, intact distal pulses and normal pulses.   No extrasystoles are present. PMI is not displaced.  Exam reveals no gallop and no friction rub.    No murmur heard.  Pulses:       Carotid pulses are 2+ on the right side, and 2+ on the left side.       Radial pulses are 2+ on the right side, and 2+ on the left side.        Femoral pulses are 2+ on the right side, and 2+ on the left side.       Popliteal pulses are 2+ on the right side, and 2+ on the left side.        Dorsalis pedis pulses are 2+ on the right side, and 2+ on the left side.        Posterior tibial pulses are 2+ on the right side, and 2+ on the left side.   Pulmonary/Chest: Effort normal and breath sounds normal. No accessory muscle usage. No apnea, no tachypnea and no bradypnea. No respiratory distress. He has no decreased breath sounds. He has no wheezes. He has no rhonchi. He has no rales.   Abdominal: Normal appearance and normal aorta. There is no hepatosplenomegaly. There is no CVA tenderness. No hernia. Hernia confirmed negative in the ventral area.   Musculoskeletal: He exhibits tenderness. He exhibits no edema.   OA knees and low back   Neurological: He is alert and oriented to person, place, and time. He has normal strength. He is not disoriented. He displays no atrophy and no tremor. No cranial nerve deficit or sensory deficit. He exhibits normal muscle tone. He displays a negative Romberg sign. Coordination normal.   Diabetic foot exam was normal with intact light touch and vibratory senses.     Skin: Skin is warm, dry and intact. No abrasion and no rash noted. He is not diaphoretic. No cyanosis. No pallor. Nails show no clubbing.   Psychiatric: He has a normal mood  and affect. His speech is normal and behavior is normal. Judgment and thought content normal. Cognition and memory are normal.     Assessment:      Problem   Laceration of Left Thumb.  Slow healing   Memory Loss. Not on med. No change.   Type II Diabetes Mellitus (Hcc). stable   Hyperlipidemia. Slight wt loss   Essential Hypertension good w/ meds.   Postsurgical Aortocoronary Bypass Status. No new c/o.         Plan:     All above problems reviewed and the found to be unchanged except for the following :     Laceration of Left Thumb. Healing well.   Memory Loss. To take Aricpt 10 mg first thing in am   Type II Diabetes Mellitus (Hcc). Continue meds.    Hyperlipidemia. Continue meds.   Essential Hypertension. Continue meds. Home BP check,   Postsurgical Aortocoronary Bypass Status.to callif.to call if new c/o.

## 2014-06-29 NOTE — Assessment & Plan Note (Signed)
meds makes him not want to sleep. Taking meds at bed time.

## 2014-06-30 LAB — RENAL FUNCTION PANEL
Albumin: 4.3 g/dL (ref 3.4–5.0)
Anion Gap: 16 (ref 3–16)
BUN: 13 mg/dL (ref 7–20)
CO2: 25 mmol/L (ref 21–32)
Calcium: 9.4 mg/dL (ref 8.3–10.6)
Chloride: 102 mmol/L (ref 99–110)
Creatinine: 0.8 mg/dL (ref 0.8–1.3)
GFR African American: >60 (ref >60)
GFR Non-African American: >60 (ref >60)
Glucose: 93 mg/dL (ref 70–99)
Phosphorus: 4.3 mg/dL (ref 2.5–4.9)
Potassium: 4.6 mmol/L (ref 3.5–5.1)
Sodium: 143 mmol/L (ref 136–145)

## 2014-06-30 LAB — HEMOGLOBIN A1C
Hemoglobin A1C: 6.1 %
eAG: 128.4 mg/dL

## 2014-07-19 MED ORDER — GLUCOSE BLOOD VI STRP
Status: AC
Start: 2014-07-19 — End: ?

## 2014-07-19 NOTE — Telephone Encounter (Signed)
From: Idalia Needle  To: Lenon Curt, MD  Sent: 07/18/2014 10:56 AM EDT  Subject: Prescription Question    Please rewrite this prescription for me to [facility]. The current prescription was written by my old [facility] and has expired and I need a refill. Thank you.      ONETOUCH ULTRA TEST STRIPS 04/16/2014 [id number] Homero Knapper ONETOUCH ULTRA TEST STRIPS 04/16/2014 [id number] Matheu Holland   Dr. Legrand Como [place]    [phone]   [facility] [id number]   [phone]            Location Details Expired

## 2014-08-09 DIAGNOSIS — N3941 Urge incontinence: Secondary | ICD-10-CM | POA: Diagnosis not present

## 2014-08-09 DIAGNOSIS — N400 Enlarged prostate without lower urinary tract symptoms: Secondary | ICD-10-CM | POA: Diagnosis not present

## 2014-09-08 DIAGNOSIS — Z23 Encounter for immunization: Secondary | ICD-10-CM | POA: Diagnosis not present

## 2014-09-30 ENCOUNTER — Ambulatory Visit: Admit: 2014-09-30 | Discharge: 2014-09-30 | Payer: MEDICARE | Attending: Internal Medicine

## 2014-09-30 DIAGNOSIS — I10 Essential (primary) hypertension: Secondary | ICD-10-CM | POA: Diagnosis not present

## 2014-09-30 DIAGNOSIS — I2510 Atherosclerotic heart disease of native coronary artery without angina pectoris: Secondary | ICD-10-CM | POA: Diagnosis not present

## 2014-09-30 DIAGNOSIS — R413 Other amnesia: Secondary | ICD-10-CM | POA: Diagnosis not present

## 2014-09-30 DIAGNOSIS — E119 Type 2 diabetes mellitus without complications: Secondary | ICD-10-CM | POA: Diagnosis not present

## 2014-09-30 MED ORDER — DONEPEZIL HCL 5 MG PO TBDP
5 MG | ORAL_TABLET | Freq: Every evening | ORAL | Status: AC
Start: 2014-09-30 — End: ?

## 2014-09-30 NOTE — Assessment & Plan Note (Signed)
Sugars are good, diet is stable, weight is stable, no reported neuropathy, no change in vision, no claudication, no foot ulcers, no new skin lesions. No change in medications. No c/o with meds. Last eye exam 10/26/2014. Has appt.

## 2014-09-30 NOTE — Assessment & Plan Note (Signed)
No change in meds, no c/o with meds, no chest pain, SOB, palpatations, or syncope. Home bp was 120s/60-80s.

## 2014-09-30 NOTE — Patient Instructions (Addendum)
All above problems reviewed and the found to be unchanged except for the following :     Memory Loss. To continue 5 mg of Aricept   Benign Prostatic Hypertrophy Without Lower Urinary Tract Symptoms ([place]). To continue present meds. To call if new c/o.    Atherosclerosis of Native Coronary Artery of Native Heart Without Angina Pectoris. Continue present  meds.   Type II Diabetes Mellitus (Hcc). Stable diet and wt. Continue present meds for now. May decrease Metformin to once daily fi sugar gets too low.   Hyperlipidemia. Continue meds. To call if new c/o.   Essential Hypertension. Continue meds. Home BP checks. To call if >140/90 regularly or if too low and gets dizzy or lightheadedness.

## 2014-09-30 NOTE — Assessment & Plan Note (Signed)
meds makes him not want to sleep. Taking meds at bed time.

## 2014-09-30 NOTE — Progress Notes (Signed)
Subjective:      Patient ID: David Beasley is a 79 y.o. male.    HPI  Type II diabetes mellitus (HCC)  Sugars are good, diet is stable, weight is stable, no reported neuropathy, no change in vision, no claudication, no foot ulcers, no new skin lesions. No change in medications. No c/o with meds. Last eye exam 10/26/2014. Has appt.    Atherosclerosis of native coronary artery of native heart without angina pectoris  The pt has reported no chest pain,palpatations,edema,shortness of breath,syncope, or lightheadedness since their last visit. They have not required any prn meds for angina-like c/o. Last cardio visit was 10/2013. appt next month. S/p CABGx 4 in 06/2009. See Cardiology 10/2014 Dr Arta Silence.     Essential hypertension  No change in meds, no c/o with meds, no chest pain, SOB, palpatations, or syncope. Home bp was 120s/60-80s.    Memory loss  meds makes him not want to sleep. Taking meds at bed time.    Hyperlipidemia  Stable diet w/ mild wt loss. No c/o w/ meds.     Past Medical History   Diagnosis Date   ??? H/O vasectomy    ??? Hernia, umbilical    ??? Cataract    ??? Hypertrophy of prostate    ??? S/P CABG x 3    ??? CAD (coronary artery disease)    ??? Diverticula, colon    ??? Colon polyps    ??? Rectal bleeding    ??? Hyperlipidemia    ??? Hypogonadism    ??? Gout      Current Outpatient Prescriptions   Medication Sig Dispense Refill   ??? donepezil (ARICEPT ODT) 5 MG disintegrating tablet Take 1 tablet by mouth nightly 30 tablet 11   ??? tamsulosin (FLOMAX) 0.4 MG capsule Take 1 capsule by mouth daily 90 capsule 3   ??? lisinopril (PRINIVIL;ZESTRIL) 5 MG tablet Take 1 tablet by mouth daily 90 tablet 3   ??? metoprolol (LOPRESSOR) 25 MG tablet Take 1 tablet by mouth 2 times daily 180 tablet 3   ??? finasteride (PROSCAR) 5 MG tablet Take 1 tablet by mouth daily 90 tablet 3   ??? rosuvastatin (CRESTOR) 10 MG tablet Take 1 tablet by mouth daily 90 tablet 3   ??? oxybutynin (DITROPAN-XL) 10 MG CR tablet Take 10 mg by mouth daily     ??? glucose  blood VI test strips (ONE TOUCH ULTRA TEST) strip Test once daily for diabetes dx 250.00 100 each 3   ??? metFORMIN ER (GLUCOPHAGE-XR) 500 MG XR tablet Take 1 tablet by mouth 2 times daily (with meals) 180 tablet 3   ??? ALPRAZolam (XANAX) 0.25 MG tablet Take 1 tablet by mouth nightly as needed for Sleep 90 tablet 0   ??? glucose blood VI test strips (ASCENSIA AUTODISC VI;ONE TOUCH ULTRA TEST VI) strip Test blood sugar once daily for diabetes dx 250.00 100 each 3   ??? ONE TOUCH ULTRASOFT LANCETS MISC Test blood sugar once daily for diabetes dx 250.00 100 each 3   ??? aspirin 325 MG tablet Take 325 mg by mouth.     ??? Lysine 500 MG TABS Take  by mouth.     ??? Multiple Vitamins-Minerals (MULTIVITAMIN PO) Take  by mouth.     ??? psyllium (METAMUCIL SMOOTH TEXTURE) 28 % packet Take  by mouth.     ??? glucose blood VI test strips (TRUETEST TEST) strip        No current facility-administered medications for this visit.  Allergies   Allergen Reactions   ??? Sulfa Antibiotics      History   Substance Use Topics   ??? Smoking status: Former Smoker -- 1.00 packs/day for 30 years     Start date: 11/19/1952     Quit date: 01/18/1983   ??? Smokeless tobacco: Never Used      Comment: Quit in 1984   ??? Alcohol Use: No     Review of Systems   Constitutional: Negative.    Respiratory: Negative.    Cardiovascular: Negative.    Gastrointestinal: Negative.    Endocrine: Negative.    Genitourinary: Negative.    Musculoskeletal: Negative.    Skin: Negative.    Allergic/Immunologic: Negative.    Neurological: Negative.    Hematological: Negative.    Psychiatric/Behavioral: Negative.      Filed Vitals:    09/30/14 1321 09/30/14 1402   BP: 110/60 108/66   Pulse: 56    Resp: 16    Height: 5\' 6"  (1.676 m)    Weight: 187 lb 3.2 oz (84.913 kg)      Objective:   Physical Exam   Constitutional: He is oriented to person, place, and time. Vital signs are normal. He appears well-developed and well-nourished. No distress.   HENT:   Head: Normocephalic and atraumatic.    Eyes: Conjunctivae and EOM are normal. Pupils are equal, round, and reactive to light.   Neck: Trachea normal, normal range of motion, full passive range of motion without pain and phonation normal. Neck supple. Normal carotid pulses, no hepatojugular reflux and no JVD present. Carotid bruit is not present. No thyroid mass and no thyromegaly present.   Cardiovascular: Normal rate, regular rhythm, normal heart sounds, intact distal pulses and normal pulses.   No extrasystoles are present. PMI is not displaced.  Exam reveals no gallop and no friction rub.    No murmur heard.  Pulses:       Carotid pulses are 2+ on the right side, and 2+ on the left side.       Radial pulses are 2+ on the right side, and 2+ on the left side.        Femoral pulses are 2+ on the right side, and 2+ on the left side.       Popliteal pulses are 2+ on the right side, and 2+ on the left side.        Dorsalis pedis pulses are 2+ on the right side, and 2+ on the left side.        Posterior tibial pulses are 2+ on the right side, and 2+ on the left side.   Pulmonary/Chest: Effort normal and breath sounds normal. No accessory muscle usage. No apnea, no tachypnea and no bradypnea. No respiratory distress. He has no decreased breath sounds. He has no wheezes. He has no rhonchi. He has no rales.   Abdominal: Soft. Normal appearance, normal aorta and bowel sounds are normal. He exhibits no distension and no mass. There is no hepatosplenomegaly. There is no rebound, no guarding and no CVA tenderness. No hernia. Hernia confirmed negative in the ventral area.   Musculoskeletal: Normal range of motion. He exhibits no edema or tenderness.   Neurological: He is alert and oriented to person, place, and time. He has normal strength. He is not disoriented. He displays no atrophy and no tremor. No cranial nerve deficit or sensory deficit. He exhibits normal muscle tone. He displays a negative Romberg sign. Coordination normal.   Skin: Skin  is warm, dry and  intact. No abrasion and no rash noted. He is not diaphoretic. No cyanosis. No pallor. Nails show no clubbing.   Psychiatric: He has a normal mood and affect. His speech is normal and behavior is normal. Judgment and thought content normal. Cognition and memory are normal.       Assessment:      Problem   Memory Loss. Stable w/ med   Benign Prostatic Hypertrophy Without Lower Urinary Tract Symptoms (Luts). Doing well w/ meds.   Atherosclerosis of Native Coronary Artery of Native Heart Without Angina Pectoris. No new c/o since surgery in 2010   Type II Diabetes Mellitus (Hcc). Good sugars.   Hyperlipidemia. Stable w/ diet and wt.   Essential Hypertension. Stable diet and wt. Home BP good.         Plan:      All above problems reviewed and the found to be unchanged except for the following :     Memory Loss. To continue 5 mg of Aricept   Benign Prostatic Hypertrophy Without Lower Urinary Tract Symptoms (Luts). To continue present meds. To call if new c/o.    Atherosclerosis of Native Coronary Artery of Native Heart Without Angina Pectoris. Continue present  meds.   Type II Diabetes Mellitus (Hcc). Stable diet and wt. Continue present meds for now. May decrease Metformin to once daily fi sugar gets too low.   Hyperlipidemia. Continue meds. To call if new c/o.   Essential Hypertension. Continue meds. Home BP checks. To call if >140/90 regularly or if too low and gets dizzy or lightheadedness.

## 2014-09-30 NOTE — Assessment & Plan Note (Signed)
The pt has reported no chest pain,palpatations,edema,shortness of breath,syncope, or lightheadedness since their last visit. They have not required any prn meds for angina-like c/o. Last cardio visit was 10/2013. appt next month. S/p CABGx 4 in 06/2009. See Cardiology 10/2014 Dr Paquin.

## 2014-09-30 NOTE — Assessment & Plan Note (Signed)
Stable diet w/ mild wt loss. No c/o w/ meds.

## 2014-10-11 DIAGNOSIS — R350 Frequency of micturition: Secondary | ICD-10-CM | POA: Diagnosis not present

## 2014-10-11 DIAGNOSIS — N401 Enlarged prostate with lower urinary tract symptoms: Secondary | ICD-10-CM | POA: Diagnosis not present

## 2014-10-11 DIAGNOSIS — N3941 Urge incontinence: Secondary | ICD-10-CM | POA: Diagnosis not present

## 2014-10-25 ENCOUNTER — Ambulatory Visit: Admit: 2014-10-25 | Discharge: 2014-10-25 | Payer: MEDICARE | Attending: Cardiovascular Disease

## 2014-10-25 DIAGNOSIS — I10 Essential (primary) hypertension: Secondary | ICD-10-CM | POA: Diagnosis not present

## 2014-10-25 DIAGNOSIS — E782 Mixed hyperlipidemia: Secondary | ICD-10-CM | POA: Diagnosis not present

## 2014-10-25 DIAGNOSIS — I2510 Atherosclerotic heart disease of native coronary artery without angina pectoris: Secondary | ICD-10-CM | POA: Diagnosis not present

## 2014-10-25 NOTE — Progress Notes (Signed)
[facility]   Cardiovascular Evaluation    PATIENT: David Beasley  DATE: 10/25/2014  MRN: [id number]  CSN: [id number]  DOB: [birth year]      Primary Care Doctor: C.Lenon Curt, MD  Reason for evaluation:   1 Year Follow Up; and Chest Pain      Subjective:   History of present illness on initial date of evaluation:   David Beasley is a 79 y.o. patient who presents for follow up.  Has a  history of CAD, S/P CABG x4 in 06/2009 in [place] with Dr. Jones Skene, HTN and HLD. On 10/28/13 he reported prior to CABG, he was having SOB with activity. He went to his PCP at that time. He was told he did not have an MI but had blockages that resulted in CABG. He moved to [place], [place] from [place] in May 2014.He denied CP, SOB, dizziness or syncope.    Today he states that he has been feeling "great."  Denies exertional chest pain, shortness of breath, edema, dizziness, palpitations and syncope.  Does report to occasionally having left sided abdominal pain that he believes is musculoskeletal.  Denies having any of the symptoms that he had with his bypass surgery.      Patient Active Problem List   Diagnosis   ??? Type II diabetes mellitus ([place])   ??? 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 Postsurgical aortocoronary bypass status   ??? Atherosclerosis of native coronary artery of native heart without angina pectoris   ??? Diverticulosis of colon   ??? Gout   ??? Essential hypertension   ??? Benign prostatic hypertrophy without lower urinary tract symptoms (LUTS)   ??? Testicular hypofunction   ??? Memory loss   ??? Hyperlipidemia   ??? Postsurgical percutaneous transluminal coronary angioplasty status   ??? History of colonic polyps   ??? Routine general medical examination at a health care facility   ??? Personal history of tobacco use, presenting hazards to health   ??? Annual physical exam   ??? BPH (benign prostatic hypertrophy) with urinary retention   ??? Laceration of left thumb         Past Medical  History:   has a past medical history of H/O vasectomy; Hernia, umbilical; Cataract; Hypertrophy of prostate; S/P CABG x 3; CAD (coronary artery disease); Diverticula, colon; Colon polyps; Rectal bleeding; Hyperlipidemia; Hypogonadism; and Gout.    Surgical History:   has past surgical history that includes Tonsillectomy; Percutaneous Transluminal Coronary Angio; Colonoscopy; hernia repair; Cataract removal; and Coronary artery bypass graft.     Social History:   reports that he quit smoking about 31 years ago. He started smoking about 61 years ago. He has never used smokeless tobacco. He reports that he does not drink alcohol or use illicit drugs.     Family History:  No evidence for sudden cardiac death or premature CAD    Home Medications:  Reviewed and are listed in nursing record. and/or listed below  Current Outpatient Prescriptions   Medication Sig Dispense Refill   ??? oxybutynin (DITROPAN-XL) 10 MG CR tablet Take 10 mg by mouth daily     ??? donepezil (ARICEPT ODT) 5 MG disintegrating tablet Take 1 tablet by mouth nightly (Patient taking differently: Take 5 mg by mouth every morning ) 30 tablet 11   ??? glucose blood VI test strips (ONE TOUCH ULTRA TEST) strip Test once daily for diabetes dx 250.00 100 each 3   ???  tamsulosin (FLOMAX) 0.4 MG capsule Take 1 capsule by mouth daily 90 capsule 3   ??? lisinopril (PRINIVIL;ZESTRIL) 5 MG tablet Take 1 tablet by mouth daily 90 tablet 3   ??? metFORMIN ER (GLUCOPHAGE-XR) 500 MG XR tablet Take 1 tablet by mouth 2 times daily (with meals) 180 tablet 3   ??? metoprolol (LOPRESSOR) 25 MG tablet Take 1 tablet by mouth 2 times daily 180 tablet 3   ??? ALPRAZolam (XANAX) 0.25 MG tablet Take 1 tablet by mouth nightly as needed for Sleep 90 tablet 0   ??? finasteride (PROSCAR) 5 MG tablet Take 1 tablet by mouth daily 90 tablet 3   ??? glucose blood VI test strips (ASCENSIA AUTODISC VI;ONE TOUCH ULTRA TEST VI) strip Test blood sugar once daily for diabetes dx 250.00 100 each 3   ??? ONE TOUCH  ULTRASOFT LANCETS MISC Test blood sugar once daily for diabetes dx 250.00 100 each 3   ??? rosuvastatin (CRESTOR) 10 MG tablet Take 1 tablet by mouth daily 90 tablet 3   ??? aspirin 325 MG tablet Take 325 mg by mouth.     ??? Lysine 500 MG TABS Take  by mouth.     ??? Multiple Vitamins-Minerals (MULTIVITAMIN PO) Take  by mouth.     ??? psyllium (METAMUCIL SMOOTH TEXTURE) 28 % packet Take  by mouth.     ??? glucose blood VI test strips (TRUETEST TEST) strip        No current facility-administered medications for this visit.        Allergies:  Sulfa antibiotics     Review of Systems:   All 12 point review of symptoms completed. Pertinent positives identified in the HPI, all other review of symptoms negative as below.    Objective:   PHYSICAL EXAM:    Filed Vitals:    10/25/14 1440   BP: 118/62   Pulse: 56   SpO2: 97%    Weight: 185 lb (83.915 kg)     Wt Readings from Last 3 Encounters:   10/25/14 185 lb (83.915 kg)   09/30/14 187 lb 3.2 oz (84.913 kg)   06/29/14 188 lb 12.8 oz (85.639 kg)       General Appearance:  Alert, cooperative, no distress, appears stated age   Head:  Normocephalic, atraumatic   Eyes:  PERRL, conjunctiva/corneas clear   Nose: Nares normal, no drainage or sinus tenderness   Throat: Lips, mucosa, and tongue normal   Neck: Supple, symmetrical, trachea midline, NL thyroid no carotid bruit or JVD   Lungs:   CTAB, respirations unlabored   Chest Wall:  No tenderness or deformity, well healed surgical scar   Heart:  Regular rhythm and normal rate; S1, S2 are normal;   no murmur noted; no rub or gallop   Abdomen:   Soft, non-tender, +BS x 4, no masses, no organomegaly   Extremities: Extremities normal, atraumatic, no cyanosis or edema   Pulses: 2+ and symmetric   Skin: Skin color, texture, turgor normal, no rashes or lesions   Pysch: Normal mood and affect   Neurologic: Normal gross motor and sensory exam.         LABS   CBC:      No results found for: WBC  CMP:  Lab Results   Component Value Date    NA 143  06/29/2014    K 4.6 06/29/2014    CL 102 06/29/2014    CO2 25 06/29/2014    BUN 13 06/29/2014  CREATININE 0.8 06/29/2014    GFRAA >60 06/29/2014    LABGLOM >60 06/29/2014    GLUCOSE 93 06/29/2014    PROT 6.8 12/30/2013    CALCIUM 9.4 06/29/2014    BILITOT 0.4 12/30/2013    ALKPHOS 71 12/30/2013    AST 19 12/30/2013    ALT 16 12/30/2013     PT/INR:   No components found for: PTPATIENT,  PTINR  Liver:  No components found for: CHLPL  Lab Results   Component Value Date    ALT 16 12/30/2013    AST 19 12/30/2013    ALKPHOS 71 12/30/2013    BILITOT 0.4 12/30/2013     Lab Results   Component Value Date    LABA1C 6.1 06/29/2014     Lipids:         Lab Results   Component Value Date    TRIG 104 12/30/2013    TRIG 130 09/25/2013            Lab Results   Component Value Date    HDL 47 12/30/2013    HDL 49 09/25/2013            Lab Results   Component Value Date    LDLCALC 50 12/30/2013    LDLCALC 42 09/25/2013            Lab Results   Component Value Date    LABVLDL 21 12/30/2013    LABVLDL 26 09/25/2013         CARDIAC DATA   EKG:  10/28/2013 NSR, no ischemia or infarcts.      Assessment and Plan   Daxtin Leiker is a 79 y.o. male who presents today for the following problems:        1 CAD s/p 4V CABG [place]  Jen[place]. (LIMA-LAD, SVG-PLV, and SVG-OM- records under media) Doing very well, no symptoms   ~ Cont ASA, BB, statin, lisinopril     2. HTN: Controlled    3. HLD: Well controlled    Appreciate Dr. Senaida Ores assistance.       Plan:  1. No changes warranted at this time  2. Doing well from a cardiac standpoint  3. Follow up with me in 1 year    It is a pleasure to assist in the care of Macyn Remmert. Please call with any questions.  All questions and concerns were addressed to the patient/family. Alternatives to my treatment were discussed. The note was completed using EMR. Every effort was made to ensure accuracy; however, inadvertent computerized transcription errors may be present.    Janeal Holmes, MD, [place] Cardiologist  [facility]  [phone] [facility]  [phone] [place]  10/25/2014  2:51 PM

## 2014-10-25 NOTE — Patient Instructions (Signed)
Plan:  1. No changes warranted at this time  2. Doing well from a cardiac standpoint  3. Follow up with me in 1 year

## 2014-10-26 NOTE — Communication Body (Signed)
Assessment and Plan    David Beasley is a 79 y.o. male who presents today for the following problems:       1 CAD s/p 4V CABG [place] Jenkins [place]. (LIMA-LAD, SVG-PLV, and SVG-OM- records under media) Doing very well, no symptoms  ~ Cont ASA, BB, statin, lisinopril  2. HTN: Controlled    3. HLD: Well controlled    Appreciate Dr. Senaida Ores assistance.       Plan:  1. No changes warranted at this time  2. Doing well from a cardiac standpoint  3. Follow up with me in 1 year    It is a pleasure to assist in the care of David Beasley. Please call with any questions.  All questions and concerns were addressed to the patient/family. Alternatives to my treatment were discussed. The note was completed using EMR. Every effort was made to ensure accuracy; however, inadvertent computerized transcription errors may be present.

## 2014-10-29 ENCOUNTER — Encounter: Attending: Cardiovascular Disease

## 2014-11-01 DIAGNOSIS — H43813 Vitreous degeneration, bilateral: Secondary | ICD-10-CM | POA: Diagnosis not present

## 2014-11-01 DIAGNOSIS — H52203 Unspecified astigmatism, bilateral: Secondary | ICD-10-CM | POA: Diagnosis not present

## 2014-11-01 DIAGNOSIS — H02833 Dermatochalasis of right eye, unspecified eyelid: Secondary | ICD-10-CM | POA: Diagnosis not present

## 2014-11-01 DIAGNOSIS — E119 Type 2 diabetes mellitus without complications: Secondary | ICD-10-CM | POA: Diagnosis not present

## 2014-11-01 DIAGNOSIS — H04123 Dry eye syndrome of bilateral lacrimal glands: Secondary | ICD-10-CM | POA: Diagnosis not present

## 2014-11-01 DIAGNOSIS — H02002 Unspecified entropion of right lower eyelid: Secondary | ICD-10-CM | POA: Diagnosis not present

## 2014-11-01 DIAGNOSIS — H02836 Dermatochalasis of left eye, unspecified eyelid: Secondary | ICD-10-CM | POA: Diagnosis not present

## 2014-11-02 NOTE — Telephone Encounter (Signed)
Pt called and said that he is breaking out again, he said that it has been  Going on for a couple weeks now and he needs to be seen before February.

## 2014-11-02 NOTE — Telephone Encounter (Signed)
Pt called back for David Beasley

## 2014-11-02 NOTE — Telephone Encounter (Signed)
L/vm for patient to return call.

## 2014-11-03 NOTE — Telephone Encounter (Signed)
Patient scheduled for follow up on 12/06/2014.

## 2014-11-03 NOTE — Telephone Encounter (Signed)
If this is been going on for 6 months, it's unlikely that it is herpes.  It could be rosacea or we can see if he needs PDT again.  Can you look to get him scheduled within the next month for follow-up or if there is a cancellation before then, then we can get him in.    OK to double him at 830 on a day if he can come that early.

## 2014-11-03 NOTE — Telephone Encounter (Signed)
Patient stated that he has little red blisters on your cheeks and some are filled with pus, but getting worse month by month (x 6 mos). Patient not sure if the blisters are part of his herpes simplex of if he needs PDT again?

## 2014-12-06 ENCOUNTER — Ambulatory Visit: Admit: 2014-12-06 | Discharge: 2014-12-06 | Payer: MEDICARE | Attending: Dermatology

## 2014-12-06 DIAGNOSIS — L570 Actinic keratosis: Secondary | ICD-10-CM | POA: Diagnosis not present

## 2014-12-06 DIAGNOSIS — D492 Neoplasm of unspecified behavior of bone, soft tissue, and skin: Secondary | ICD-10-CM | POA: Diagnosis not present

## 2014-12-06 DIAGNOSIS — L719 Rosacea, unspecified: Secondary | ICD-10-CM | POA: Diagnosis not present

## 2014-12-06 DIAGNOSIS — D485 Neoplasm of uncertain behavior of skin: Secondary | ICD-10-CM | POA: Diagnosis not present

## 2014-12-06 MED ORDER — METRONIDAZOLE 0.75 % EX CREA
0.75 % | CUTANEOUS | Status: AC
Start: 2014-12-06 — End: ?

## 2014-12-06 NOTE — Progress Notes (Signed)
[facility]  Duffy Bruce, MD  [phone]      Idalia Needle  19-Oct-1935    80 y.o. male     Date of Visit: 12/06/2014    Chief Complaint:   Chief Complaint   Patient presents with   ??? Follow-up     skin check     Last seen: 01-2014    History of Present Illness:    1. He is here for a full skin check for hx of AK's.  S/p PDT for the face in January 2015.  He noted that his face felt a lot smoother after trx.  He has a few rough lesions on both cheeks.  2. He has a tender lesion on the R cheek, present for a few mos.  3. He has a very dark brown lesion on the chest.  4. He had called last month about recurrent papular and pustular breakouts on the cheeks.  He is tried no treatment for these except for Aveeno aftershave lotion.    + hx of HSV  He has never had skin cancer but his mother possibly had a nonmelanoma skin cancer.  No known family history of melanoma.    Review of Systems:  Gen: Feels well, good sense of health.    Past Medical History, Family History, Surgical History, Medications and Allergies reviewed.    Physical Examination     Gen, well-appearing  The following were examined and determined to be normal: Psych/Neuro, Scalp/hair, Conjunctivae/eyelids, Gums/teeth/lips, Neck, Abdomen, Back, RUE, LUE, RLE, LLE, Nails/digits, buttocks.  The following were examined and determined to be abnormal: Head/face and Breast/axilla/chest.     Baseline photos of the cheeks demonstrate AK's - in the media section.  He has significantly fewer scattered roughly scaled erythematous macules on the lateral cheeks and forehead currently  R cheek with tender erythematous roughly scaled macule  Central chest, L of midline with irregularly-shaped ill-defined dark brown 4 mm macule  Cheeks with multiple erythematous 1-2 mm papules and few tiny pustules            Assessment and Plan     1. AK's - face; scattered remaining AK's but markedly improved post PDT  - 7 lesion(s) treated with liquid nitrogen  x 2 cycles. Patient educated on risk of blister, hypopigmentation/scar and wound care.   - Consider PDT again if needed next year  + History of HSV - will need medication sent in prior to appointment    2. R/o AK vs SCC in situ - R cheek  3. R/o dysplastic nevus, doubt MM - chest, L of midline  - 2 Shave biopsy performed after verbal consent obtained. Patient educated regarding risk of bleeding, infection, scar and educated on wound care.   Skin cleansed with alcohol pad and site anesthetized with lido + epi.    Aluminum chloride applied to site for hemostasis.  Petrolatum ointment and bandage applied.  Specimen bottle labeled with patient information and site and specimen sent to [facility].     4. Rosacea, moderate PP  - metrocream bid  - ed diagnosis, triggers

## 2014-12-06 NOTE — Patient Instructions (Signed)
Biopsy Wound Care Instructions    ?? Keep the bandage in place for 24 hours.   ?? Cleanse the wound with mild soapy water daily  ??? Gently dry the area.  ??? Apply Vaseline or petroleum jelly to the wound using a cotton tipped applicator.  ??? Cover with a clean bandage.  ??? Repeat this process until the biopsy site is healed.  ??? If you had stitches placed, continue treating the site until the stitches are removed. Remember to make an appointment to return to have your stitches removed by our staff.  ??? You may shower and bathe as usual.       ** Biopsy results generally take around 7 business days to come back.  If you have not heard from us by then, please call the office at [phone] between 8AM and 4PM Monday through Friday.        Cryosurgery (Freezing) Wound Care Instructions    AFTER THE PROCEDURE:   ??? You will notice swelling and redness around the site. This is normal.   ??? You may experience a sharp or sore feeling for the next several days. For this discomfort, you may take acetaminophen (Tylenol??).   ??? A blister may develop at the treated area, sometimes as soon as by the end of the day. After several days, the blister will subside and a scab will form.   ??? If the area is bumped or traumatized during the first few days following freezing, you may develop bleeding into the blister, forming a blood blister. This is nothing to be alarmed about.  ??? If the blister is tense, uncomfortable, or much larger than the site that was frozen, you may pop the blister along its edge with a sterile needle (boiled, heated under a flame, or cleaned with alcohol) to allow the fluid to drain out. If the blister does not bother you, no treatment is needed.   ??? Do NOT peel off the top of the blister roof. It will act as a dressing on top of your wound.   WOUND CARE:   ??? You may shower or bathe as usual, but avoid scrubbing the areas that have been frozen.    ??? Cleanse the site twice a day with mild soapy water, and then apply a  thin film of white petrolatum (Vaseline??).   ??? You do not need to cover the area, but can if you prefer.   ??? Do NOT allow the site to become dry or crusted, or attempt to dry it out with rubbing alcohol or hydrogen peroxide.   ??? Continue this regimen until the area is pink and healed. Depending on the size and location of your cryosurgery site, healing may take 2 to 4 weeks.   ??? The area may continue to be pink for several weeks, and over the next few months may become darker or lighter than the surrounding skin. This may be a permanent change.

## 2014-12-13 NOTE — Telephone Encounter (Signed)
Spoke to patient's wife (patient is not feeling well and could not come to the phone). Patient will return call tomorrow regarding bx results of:    Chest left of midline-atypical mole. Return for punch/excision removal to get entire lesion.   Rt cheek-actinic keratosis. Pre-cancer. Recheck when he follows up for chest lesion.

## 2014-12-14 NOTE — Telephone Encounter (Signed)
Notified patient with bx results and scheduled for procedure.

## 2015-01-06 ENCOUNTER — Ambulatory Visit: Admit: 2015-01-06 | Discharge: 2015-01-06 | Payer: MEDICARE | Attending: Internal Medicine

## 2015-01-06 DIAGNOSIS — R413 Other amnesia: Secondary | ICD-10-CM | POA: Diagnosis not present

## 2015-01-06 DIAGNOSIS — M10072 Idiopathic gout, left ankle and foot: Secondary | ICD-10-CM | POA: Diagnosis not present

## 2015-01-06 DIAGNOSIS — I2510 Atherosclerotic heart disease of native coronary artery without angina pectoris: Secondary | ICD-10-CM | POA: Diagnosis not present

## 2015-01-06 DIAGNOSIS — K635 Polyp of colon: Secondary | ICD-10-CM | POA: Diagnosis not present

## 2015-01-06 DIAGNOSIS — E119 Type 2 diabetes mellitus without complications: Secondary | ICD-10-CM | POA: Diagnosis not present

## 2015-01-06 DIAGNOSIS — E780 Pure hypercholesterolemia: Secondary | ICD-10-CM | POA: Diagnosis not present

## 2015-01-06 NOTE — Assessment & Plan Note (Signed)
Sugars are good, diet is good, weight is down slightly, no reported neuropathy, no change in vision, no claudication, no foot ulcers, no new skin lesions. No change in medications. No c/o with meds. Last eye exam 10/2014. rechx 1 yr.

## 2015-01-06 NOTE — Assessment & Plan Note (Signed)
MMSE=29/30. On Aricept and about same.

## 2015-01-06 NOTE — Addendum Note (Signed)
Addended by: Lenon Curt on: 01/06/2015 01:54 PM     Modules accepted: Orders

## 2015-01-06 NOTE — Assessment & Plan Note (Signed)
The pt has reported no chest pain,palpatations,edema,shortness of breath,syncope, or lightheadedness since their last visit. They have not required any prn meds for angina-like c/o. Last cardio visit was 10/2013. appt next month. S/p CABGx 4 in 06/2009. See Cardiology 10/2014 Dr Arta Silence.

## 2015-01-06 NOTE — Assessment & Plan Note (Signed)
No meds. occassional toe pain.

## 2015-01-06 NOTE — Assessment & Plan Note (Signed)
Stable diet and wt. No c/o w/ meds.

## 2015-01-06 NOTE — Patient Instructions (Signed)
All above problems reviewed and the found to be unchanged except for the following :     Memory Loss. Continue meds. Call if new c/o.    Atherosclerosis of Native Coronary Artery of Native Heart Without Angina Pectoris. Continue meds. Call if new c/o.    Type 2 Diabetes Mellitus Without Complication (Hcc). Continue meds. Will check labs.   Pure Hypercholesterolemia. Continue meds.   Gout. Will check labs.

## 2015-01-06 NOTE — Progress Notes (Signed)
Subjective:      Patient ID: David Beasley is a 80 y.o. male.    HPI  Type 2 diabetes mellitus without complication (HCC)  Sugars are good, diet is good, weight is down slightly, no reported neuropathy, no change in vision, no claudication, no foot ulcers, no new skin lesions. No change in medications. No c/o with meds. Last eye exam 10/2014. rechx 1 yr.    Atherosclerosis of native coronary artery of native heart without angina pectoris  The pt has reported no chest pain,palpatations,edema,shortness of breath,syncope, or lightheadedness since their last visit. They have not required any prn meds for angina-like c/o. Last cardio visit was 10/2013. appt next month. S/p CABGx 4 in 06/2009. See Cardiology 10/2014 Dr Arta Silence.     Gout  No meds. occassional toe pain.    Memory loss  MMSE=29/30. On Aricept and about same.     Pure hypercholesterolemia  Stable diet and wt. No c/o w/ meds.    Past Medical History   Diagnosis Date   ??? H/O vasectomy    ??? Hernia, umbilical    ??? Cataract    ??? Hypertrophy of prostate    ??? S/P CABG x 3    ??? CAD (coronary artery disease)    ??? Diverticula, colon    ??? Colon polyps    ??? Rectal bleeding    ??? Hyperlipidemia    ??? Hypogonadism    ??? Gout      Current Outpatient Prescriptions   Medication Sig Dispense Refill   ??? metroNIDAZOLE (METROCREAM) 0.75 % cream Apply to face BID 45 g 6   ??? oxybutynin (DITROPAN-XL) 10 MG CR tablet Take 10 mg by mouth daily     ??? donepezil (ARICEPT ODT) 5 MG disintegrating tablet Take 1 tablet by mouth nightly (Patient taking differently: Take 5 mg by mouth every morning ) 30 tablet 11   ??? glucose blood VI test strips (ONE TOUCH ULTRA TEST) strip Test once daily for diabetes dx 250.00 100 each 3   ??? tamsulosin (FLOMAX) 0.4 MG capsule Take 1 capsule by mouth daily 90 capsule 3   ??? lisinopril (PRINIVIL;ZESTRIL) 5 MG tablet Take 1 tablet by mouth daily 90 tablet 3   ??? metFORMIN ER (GLUCOPHAGE-XR) 500 MG XR tablet Take 1 tablet by mouth 2 times daily (with meals) 180  tablet 3   ??? metoprolol (LOPRESSOR) 25 MG tablet Take 1 tablet by mouth 2 times daily 180 tablet 3   ??? ALPRAZolam (XANAX) 0.25 MG tablet Take 1 tablet by mouth nightly as needed for Sleep 90 tablet 0   ??? finasteride (PROSCAR) 5 MG tablet Take 1 tablet by mouth daily 90 tablet 3   ??? glucose blood VI test strips (ASCENSIA AUTODISC VI;ONE TOUCH ULTRA TEST VI) strip Test blood sugar once daily for diabetes dx 250.00 100 each 3   ??? ONE TOUCH ULTRASOFT LANCETS MISC Test blood sugar once daily for diabetes dx 250.00 100 each 3   ??? rosuvastatin (CRESTOR) 10 MG tablet Take 1 tablet by mouth daily 90 tablet 3   ??? aspirin 325 MG tablet Take 325 mg by mouth.     ??? Lysine 500 MG TABS Take  by mouth.     ??? Multiple Vitamins-Minerals (MULTIVITAMIN PO) Take  by mouth.     ??? psyllium (METAMUCIL SMOOTH TEXTURE) 28 % packet Take  by mouth.     ??? glucose blood VI test strips (TRUETEST TEST) strip  No current facility-administered medications for this visit.     Allergies   Allergen Reactions   ??? Sulfa Antibiotics      History   Substance Use Topics   ??? Smoking status: Former Smoker -- 1.00 packs/day for 30 years     Start date: 11/19/1950     Quit date: 01/18/1983   ??? Smokeless tobacco: Never Used      Comment: Quit in 1984   ??? Alcohol Use: No     Review of Systems   Constitutional: Negative.    Eyes: Negative.    Respiratory: Negative.    Cardiovascular: Negative.    Gastrointestinal: Negative.    Endocrine: Negative.    Musculoskeletal: Negative.    Allergic/Immunologic: Negative.    Neurological: Negative.    Hematological: Negative.    Psychiatric/Behavioral: Negative.      Filed Vitals:    01/06/15 1319 01/06/15 1346   BP: 116/60 118/66   Pulse: 64    Resp: 18    Height: 5\' 6"  (1.676 m)    Weight: 183 lb 6.4 oz (83.19 kg)      Objective:   Physical Exam   Constitutional: He is oriented to person, place, and time. Vital signs are normal. He appears well-developed and well-nourished. No distress.   HENT:   Head: Normocephalic  and atraumatic.   Eyes: Conjunctivae and EOM are normal. Pupils are equal, round, and reactive to light.   Neck: Trachea normal, normal range of motion, full passive range of motion without pain and phonation normal. Neck supple. Normal carotid pulses, no hepatojugular reflux and no JVD present. Carotid bruit is not present. No thyroid mass and no thyromegaly present.   Cardiovascular: Normal rate, regular rhythm, normal heart sounds, intact distal pulses and normal pulses.   No extrasystoles are present. PMI is not displaced.  Exam reveals no gallop and no friction rub.    No murmur heard.  Pulses:       Carotid pulses are 2+ on the right side, and 2+ on the left side.       Radial pulses are 2+ on the right side, and 2+ on the left side.        Femoral pulses are 2+ on the right side, and 2+ on the left side.       Popliteal pulses are 2+ on the right side, and 2+ on the left side.        Dorsalis pedis pulses are 2+ on the right side, and 2+ on the left side.        Posterior tibial pulses are 2+ on the right side, and 2+ on the left side.   Pulmonary/Chest: Effort normal and breath sounds normal. No accessory muscle usage. No apnea, no tachypnea and no bradypnea. No respiratory distress. He has no decreased breath sounds. He has no wheezes. He has no rhonchi. He has no rales.   Abdominal: Normal appearance and normal aorta. There is no hepatosplenomegaly. There is no CVA tenderness. No hernia. Hernia confirmed negative in the ventral area.   Neurological: He is alert and oriented to person, place, and time. He has normal strength. He is not disoriented. He displays no atrophy and no tremor. No cranial nerve deficit or sensory deficit. He exhibits normal muscle tone. He displays a negative Romberg sign. Coordination normal.   Diabetic foot exam was normal with intact light touch and vibratory senses.     Skin: Skin is warm, dry and intact. No abrasion and  no rash noted. He is not diaphoretic. No cyanosis. No  pallor. Nails show no clubbing.   Psychiatric: He has a normal mood and affect. His speech is normal and behavior is normal. Judgment and thought content normal. Cognition and memory are normal.       Assessment:      Problem   Memory Loss. stable   Atherosclerosis of Native Coronary Artery of Native Heart Without Angina Pectoris. stable   Type 2 Diabetes Mellitus Without Complication (Hcc). stable   Pure Hypercholesterolemia. stable   Gout. stable         Plan:      All above problems reviewed and the found to be unchanged except for the following :     Memory Loss. Continue meds. Call if new c/o.    Atherosclerosis of Native Coronary Artery of Native Heart Without Angina Pectoris. Continue meds. Call if new c/o.    Type 2 Diabetes Mellitus Without Complication (Hcc). Continue meds. Will check labs.   Pure Hypercholesterolemia. Continue meds.   Gout. Will check labs.

## 2015-01-07 LAB — BASIC METABOLIC PANEL
Anion Gap: 16 (ref 3–16)
BUN: 12 mg/dL (ref 7–20)
CO2: 25 mmol/L (ref 21–32)
Calcium: 9.4 mg/dL (ref 8.3–10.6)
Chloride: 103 mmol/L (ref 99–110)
Creatinine: 0.9 mg/dL (ref 0.8–1.3)
GFR African American: >60 (ref >60)
GFR Non-African American: >60 (ref >60)
Glucose: 120 mg/dL — ABNORMAL HIGH (ref 70–99)
Potassium: 4.5 mmol/L (ref 3.5–5.1)
Sodium: 144 mmol/L (ref 136–145)

## 2015-01-07 LAB — HEMOGLOBIN A1C
Hemoglobin A1C: 5.8 %
eAG: 119.8 mg/dL

## 2015-01-07 LAB — URIC ACID: Uric Acid, Serum: 6.1 mg/dL (ref 3.5–7.2)

## 2015-01-07 MED ORDER — ALLOPURINOL 100 MG PO TABS
100 MG | ORAL_TABLET | ORAL | Status: AC
Start: 2015-01-07 — End: ?

## 2015-01-19 DIAGNOSIS — K635 Polyp of colon: Secondary | ICD-10-CM | POA: Diagnosis not present

## 2015-01-27 ENCOUNTER — Ambulatory Visit: Admit: 2015-01-27 | Discharge: 2015-01-27 | Payer: MEDICARE | Attending: Dermatology

## 2015-01-27 DIAGNOSIS — D225 Melanocytic nevi of trunk: Secondary | ICD-10-CM | POA: Diagnosis not present

## 2015-01-27 DIAGNOSIS — D485 Neoplasm of uncertain behavior of skin: Secondary | ICD-10-CM | POA: Diagnosis not present

## 2015-01-27 DIAGNOSIS — L570 Actinic keratosis: Secondary | ICD-10-CM | POA: Diagnosis not present

## 2015-01-27 DIAGNOSIS — D235 Other benign neoplasm of skin of trunk: Secondary | ICD-10-CM

## 2015-01-27 NOTE — Patient Instructions (Signed)
Biopsy Wound Care Instructions    ?? Keep the bandage in place for 24 hours.   ?? Cleanse the wound with mild soapy water daily  ??? Gently dry the area.  ??? Apply Vaseline or petroleum jelly to the wound using a cotton tipped applicator.  ??? Cover with a clean bandage.  ??? Repeat this process until the biopsy site is healed.  ??? If you had stitches placed, continue treating the site until the stitches are removed. Remember to make an appointment to return to have your stitches removed by our staff.  ??? You may shower and bathe as usual.       ** Biopsy results generally take around 7 business days to come back.  If you have not heard from us by then, please call the office at [phone] between 8AM and 4PM Monday through Friday.      Cryosurgery (Freezing) Wound Care Instructions    AFTER THE PROCEDURE:   ??? You will notice swelling and redness around the site. This is normal.   ??? You may experience a sharp or sore feeling for the next several days. For this discomfort, you may take acetaminophen (Tylenol??).   ??? A blister may develop at the treated area, sometimes as soon as by the end of the day. After several days, the blister will subside and a scab will form.   ??? If the area is bumped or traumatized during the first few days following freezing, you may develop bleeding into the blister, forming a blood blister. This is nothing to be alarmed about.  ??? If the blister is tense, uncomfortable, or much larger than the site that was frozen, you may pop the blister along its edge with a sterile needle (boiled, heated under a flame, or cleaned with alcohol) to allow the fluid to drain out. If the blister does not bother you, no treatment is needed.   ??? Do NOT peel off the top of the blister roof. It will act as a dressing on top of your wound.   WOUND CARE:   ??? You may shower or bathe as usual, but avoid scrubbing the areas that have been frozen.    ??? Cleanse the site twice a day with mild soapy water, and then apply a  thin film of white petrolatum (Vaseline??).   ??? You do not need to cover the area, but can if you prefer.   ??? Do NOT allow the site to become dry or crusted, or attempt to dry it out with rubbing alcohol or hydrogen peroxide.   ??? Continue this regimen until the area is pink and healed. Depending on the size and location of your cryosurgery site, healing may take 2 to 4 weeks.   ??? The area may continue to be pink for several weeks, and over the next few months may become darker or lighter than the surrounding skin. This may be a permanent change.

## 2015-01-27 NOTE — Progress Notes (Signed)
[facility]  Duffy Bruce, MD  [phone]      David Beasley  [birth year]    80 y.o. male     Date of Visit: 01/27/2015    Chief Complaint:   Chief Complaint   Patient presents with   ??? Procedure     PT returns today for either an excision or punch of atypical mole.       Last seen: 11-2014    History of Present Illness:    Here for f/u and trx s/p bx of:  Lesion on the chest, just L of midline - dysplastic nevus with moderate dysplasia, not removed with bx.  No probs noted since bx.  Lesion on the R cheek - AK, cannot r/o invasion.  No pain since last seen.    + hx of HSV  He has never had skin cancer but his mother possibly had a nonmelanoma skin cancer.  No known family history of melanoma.    Review of Systems:  Gen: Feels well, good sense of health.    Past Medical History, Family History, Surgical History, Medications and Allergies reviewed.    Physical Examination     Gen, well-appearing  The following were examined and determined to be normal: Psych/Neuro  The following were examined and determined to be abnormal: Head/face and Breast/axilla/chest.     R cheek with erythematous roughly scaled macule  Central chest, L of midline with pink macule    Photos from last visit - biopsies:          Assessment and Plan     1. AK - R cheek  - lesion(s) treated with liquid nitrogen x 2 cycles. Patient educated on risk of blister, hypopigmentation/scar and wound care.      2. Dysplastic nevus - chest, L of midline  - 8 mm Punch excision performed after verbal consent obtained. Patient educated regarding risk of bleeding, infection, scar and educated on wound care.   Skin cleansed with alcohol pad and site anesthetized with lido + epi.    Aluminum chloride applied to site for hemostasis if necessary and sutures placed.  Petrolatum ointment and bandage applied.  Specimen bottle labeled with patient information and site and specimen sent to [facility].

## 2015-02-04 NOTE — Telephone Encounter (Signed)
Patient notified with bx result of:    Chest lest of midline-dysplastic nevus, clear.

## 2015-02-07 DIAGNOSIS — Z1212 Encounter for screening for malignant neoplasm of rectum: Secondary | ICD-10-CM | POA: Diagnosis not present

## 2015-02-07 DIAGNOSIS — Z1211 Encounter for screening for malignant neoplasm of colon: Secondary | ICD-10-CM | POA: Diagnosis not present

## 2015-02-10 ENCOUNTER — Encounter: Admit: 2015-02-10 | Discharge: 2015-02-10 | Payer: MEDICARE

## 2015-02-10 DIAGNOSIS — Z4802 Encounter for removal of sutures: Secondary | ICD-10-CM

## 2015-02-10 NOTE — Progress Notes (Signed)
Patient presents for suture removal. The wound is well healed without signs of infection.  The sutures are removed. Wound care and activity instructions given. Return prn.

## 2015-02-28 MED ORDER — CRESTOR 10 MG PO TABS
10 MG | ORAL_TABLET | ORAL | Status: AC
Start: 2015-02-28 — End: ?

## 2015-02-28 NOTE — Telephone Encounter (Signed)
5/19

## 2015-03-14 MED ORDER — ALPRAZOLAM 0.25 MG PO TABS
0.25 MG | ORAL_TABLET | ORAL | Status: AC
Start: 2015-03-14 — End: ?

## 2015-03-14 NOTE — Telephone Encounter (Signed)
5/19

## 2015-04-07 ENCOUNTER — Encounter: Attending: Internal Medicine

## 2015-04-14 MED ORDER — TAMSULOSIN HCL 0.4 MG PO CAPS
0.4 MG | ORAL_CAPSULE | Freq: Every day | ORAL | Status: AC
Start: 2015-04-14 — End: ?

## 2015-04-14 MED ORDER — FINASTERIDE 5 MG PO TABS
5 MG | ORAL_TABLET | Freq: Every day | ORAL | Status: AC
Start: 2015-04-14 — End: ?

## 2015-04-14 MED ORDER — METOPROLOL TARTRATE 25 MG PO TABS
25 MG | ORAL_TABLET | Freq: Two times a day (BID) | ORAL | Status: AC
Start: 2015-04-14 — End: ?

## 2015-04-14 NOTE — Telephone Encounter (Signed)
Recently moved back to [facility]

## 2015-04-27 ENCOUNTER — Ambulatory Visit (INDEPENDENT_AMBULATORY_CARE_PROVIDER_SITE_OTHER): Payer: Medicare Other | Admitting: Family Medicine

## 2015-04-27 ENCOUNTER — Encounter: Payer: Self-pay | Admitting: Family Medicine

## 2015-04-27 VITALS — BP 121/70 | HR 56 | Temp 97.3°F | Resp 16 | Ht 64.75 in | Wt 171.0 lb

## 2015-04-27 DIAGNOSIS — Z0000 Encounter for general adult medical examination without abnormal findings: Secondary | ICD-10-CM | POA: Diagnosis not present

## 2015-04-27 DIAGNOSIS — R634 Abnormal weight loss: Secondary | ICD-10-CM | POA: Diagnosis not present

## 2015-04-27 DIAGNOSIS — E119 Type 2 diabetes mellitus without complications: Secondary | ICD-10-CM

## 2015-04-27 DIAGNOSIS — Z8679 Personal history of other diseases of the circulatory system: Secondary | ICD-10-CM

## 2015-04-27 DIAGNOSIS — Z23 Encounter for immunization: Secondary | ICD-10-CM

## 2015-04-27 LAB — CBC WITH DIFFERENTIAL/PLATELET
Basophils Absolute: 0 10*3/uL (ref 0.0–0.1)
Basophils Relative: 0.6 % (ref 0.0–3.0)
Eosinophils Absolute: 0.1 10*3/uL (ref 0.0–0.7)
Eosinophils Relative: 2.3 % (ref 0.0–5.0)
HCT: 39.4 % (ref 39.0–52.0)
Hemoglobin: 13.3 g/dL (ref 13.0–17.0)
Lymphocytes Relative: 37.4 % (ref 12.0–46.0)
Lymphs Abs: 2.2 10*3/uL (ref 0.7–4.0)
MCHC: 33.8 g/dL (ref 30.0–36.0)
MCV: 99.8 fl (ref 78.0–100.0)
Monocytes Absolute: 0.6 10*3/uL (ref 0.1–1.0)
Monocytes Relative: 9.7 % (ref 3.0–12.0)
Neutro Abs: 2.9 10*3/uL (ref 1.4–7.7)
Neutrophils Relative %: 50 % (ref 43.0–77.0)
Platelets: 181 10*3/uL (ref 150.0–400.0)
RBC: 3.95 Mil/uL — AB (ref 4.22–5.81)
RDW: 14.6 % (ref 11.5–15.5)
WBC: 5.8 10*3/uL (ref 4.0–10.5)

## 2015-04-27 LAB — COMPREHENSIVE METABOLIC PANEL
ALT: 12 U/L (ref 0–53)
AST: 16 U/L (ref 0–37)
Albumin: 4.2 g/dL (ref 3.5–5.2)
Alkaline Phosphatase: 76 U/L (ref 39–117)
BUN: 13 mg/dL (ref 6–23)
CHLORIDE: 105 meq/L (ref 96–112)
CO2: 27 mEq/L (ref 19–32)
Calcium: 9.4 mg/dL (ref 8.4–10.5)
Creatinine, Ser: 0.95 mg/dL (ref 0.40–1.50)
GFR: 81.42 mL/min (ref >60.00)
GLUCOSE: 82 mg/dL (ref 70–99)
Potassium: 4.3 mEq/L (ref 3.5–5.1)
SODIUM: 139 meq/L (ref 135–145)
TOTAL PROTEIN: 6.6 g/dL (ref 6.0–8.3)
Total Bilirubin: 0.5 mg/dL (ref 0.2–1.2)

## 2015-04-27 LAB — HEMOGLOBIN A1C: Hgb A1c MFr Bld: 6 % (ref 4.6–6.5)

## 2015-04-27 LAB — TSH: TSH: 4.19 u[IU]/mL (ref 0.35–4.50)

## 2015-04-27 NOTE — Progress Notes (Signed)
Office Note 04/27/2015  CC:  Chief Complaint  Patient presents with  . Establish Care   HPI:  Ricardo Washington is a 80 y.o. White male who is here to establish care.  He saw Dr. Linda Hedges at [facility] on [address] in the past, then moved to [place] in 2014.  He now has moved back to [facility] permanently. Patient's most recent primary MD: Dr. Vivien Presto, [place] in [place]. Old records in EPIC/[facility] EMR were reviewed prior to or during today's visit.  Gluc checks qAM: around 100 every morning, eats fair diabetic diet and takes metformin regularly. Has lost 50 lbs in the last year PURPOSELY.  Says he diminished caloric intake but did not exercise. He says he feels very well.  He started donepezil in [place], 70m dosing led to intolerable dreams.  Decrease to 53mled to cessation of the dreams and he feels like memory is somewhat improved.   Past Medical History  Diagnosis Date  . Cataract   . Hypertrophy of prostate without urinary obstruction and other lower urinary tract symptoms (LUTS)   . S/P coronary artery bypass graft x 3   . Coronary artery disease   . Fatigue   . Past use of tobacco   . Diverticulosis of colon     severe, entire colon  . History of adenomatous polyp of colon 2002  . Rectal bleeding   . Hyperlipidemia   . Hypogonadism male   . Gout     always 2nd toe L foot  . Diabetes mellitus without complication   . History of stomach ulcers   . Urine incontinence   . Dementia     Past Surgical History  Procedure Laterality Date  . Tonsillectomy    . Vasectomy    . Umbilical hernia repair      2009  . Cataract extraction, bilateral    . Ptca    . Median sternotomy extracorporel circulation coronary    . Descending, saphenous vein graft to first obtuse marginal, sequential    . Saphenous vein graft to posterior descending and posterial lateral    . Endoscopic vein harvest right thigh    . Colonoscopy  2002, 2006, 02/25/2009    Polyp x 1 2002, none 2006 or 2010     Family History  Problem Relation Age of Onset  . Cancer Mother   . Cancer Father     History   Social History  . Marital Status: Married    Spouse Name: N/A  . Number of Children: 3  . Years of Education: 16   Occupational History  . Not on file.   Social History Main Topics  . Smoking status: Former Smoker -- 1.00 packs/day for 30 years    Types: Cigarettes    Quit date: 03/15/1983  . Smokeless tobacco: Not on file     Comment: Quit 1984  . Alcohol Use: No     Comment: Quit 1999  . Drug Use: No  . Sexual Activity: No   Other Topics Concern  . Not on file   Social History Narrative   Married 1957, has 3 sons.   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    Former smoker, quit 1984.   No alcohol since dx'd with DM in 2009.   No exercise.       Outpatient Encounter Prescriptions as of 04/27/2015  Medication Sig  .  allopurinol (ZYLOPRIM) 100 MG tablet Take 100 mg by mouth daily.  [place] Kitchen ALPRAZolam (NIRAVAM) 0.25 MG dissolvable tablet Take 0.25 mg by mouth at bedtime as needed.    Marland Kitchen aspirin 325 MG tablet Take 325 mg by mouth daily.    Marland Kitchen donepezil (ARICEPT) 10 MG tablet Take 10 mg by mouth at bedtime.  . finasteride (PROSCAR) 5 MG tablet TAKE 1 TABLET DAILY  . glucose blood test strip Use as instructed  . Lancets (ONETOUCH ULTRASOFT) lancets USE TO TEST BLOOD SUGAR 1 TIME DAILY AS DIRECTED.  Marland Kitchen lisinopril (PRINIVIL,ZESTRIL) 5 MG tablet TAKE 1 TABLET DAILY  . metFORMIN (GLUCOPHAGE-XR) 500 MG 24 hr tablet Take 1 tablet (500 mg total) by mouth 2 (two) times daily.  . metoprolol tartrate (LOPRESSOR) 25 MG tablet TAKE 1 TABLET TWICE A DAY  . metroNIDAZOLE (METROCREAM) 0.75 % cream Apply topically 2 (two) times daily.  . MULTIPLE VITAMIN PO Take 1 tablet by mouth daily.   Marland Kitchen oxybutynin (DITROPAN-XL) 10 MG 24 hr tablet Take 10 mg by mouth at bedtime.  . psyllium (METAMUCIL SMOOTH  TEXTURE) 28 % packet Take 1 packet by mouth 2 (two) times daily.    . rosuvastatin (CRESTOR) 10 MG tablet TAKE 1 TABLET DAILY  . sodium fluoride (DENTA 5000 PLUS) 1.1 % CREA dental cream Place 1 application onto teeth every evening.  . tamsulosin (FLOMAX) 0.4 MG CAPS TAKE 1 CAPSULE DAILY  . TRUETEST TEST test strip TEST 2 TIMES A DAY  . indomethacin (INDOCIN) 25 MG capsule Take 1 capsule (25 mg total) by mouth 3 (three) times daily as needed.  Marland Kitchen Lysine 500 MG TABS Take by mouth as needed.    No facility-administered encounter medications on file as of 04/27/2015.    Allergies  Allergen Reactions  . Sulfonamide Derivatives     ROS Review of Systems  Constitutional: Negative for fever and fatigue.  HENT: Negative for congestion and sore throat.   Eyes: Negative for visual disturbance.  Respiratory: Negative for cough, shortness of breath and wheezing.   Cardiovascular: Negative for chest pain.  Gastrointestinal: Negative for nausea, abdominal pain and blood in stool.  Genitourinary: Negative for dysuria.  Musculoskeletal: Negative for back pain and joint swelling.  Skin: Negative for rash.  Neurological: Negative for weakness and headaches.  Hematological: Negative for adenopathy.    PE; Blood pressure 121/70, pulse 56, temperature 97.3 F (36.3 C), temperature source Oral, resp. rate 16, height 5' 4.75" (1.645 m), weight 171 lb (77.565 kg), SpO2 95 %. Gen: Alert, well appearing.  Patient is oriented to person, place, time, and situation. AJO:INOM: no injection, icteris, swelling, or exudate.  EOMI, PERRLA. Mouth: lips without lesion/swelling.  Oral mucosa pink and moist. Oropharynx without erythema, exudate, or swelling.  CV: RRR, no m/r/g.   LUNGS: CTA bilat, nonlabored resps, good aeration in all lung fields. EXT: no clubbing, cyanosis, or edema.   Pertinent labs:  none  ASSESSMENT AND PLAN:   New pt; re-establishing care.  1) Hx of CAD, now s/p CABG and is feeling  great since. Continue all current meds, pt to re-establish care with Dr. Johnsie Cancel around 10/2015.  2) DM 2 without complications. Couldn't get urine for microalb/cr today b/c he urinated before he came to o/v today. Will get this and do foot exam at next f/u visit. HbA1c today. Continue metformin XR 500 mg bid. CMET today.  3) Wt loss of 50 lbs in the last year: pt describes dieting/restricting calories some but no exercise, so  this amount of wt loss seems a bit excessive for the amount of lifestyle change he made.  However, he feels great and has no symptoms so I hesitate to go fishing for something at this point. He will stop dieting and try to stabilize his wt or gain a little back.  If he continues to lose wt steadily then I told him to return prior to scheduled 6 mo f/u. Check CBC, CMET, TSH today.  4) Preventative health care: prevnar 13 IM today.  An After Visit Summary was printed and given to the patient.   Return in about 6 months (around 10/27/2015) for f/u chronic problems.

## 2015-04-27 NOTE — Progress Notes (Signed)
Pre visit review using our clinic review tool, if applicable. No additional management support is needed unless otherwise documented below in the visit note. 

## 2015-04-29 ENCOUNTER — Telehealth: Payer: Self-pay | Admitting: Family Medicine

## 2015-04-29 NOTE — Telephone Encounter (Signed)
Returning call from [facility]. Please call.

## 2015-05-02 NOTE — Telephone Encounter (Signed)
Medical records fax received from [facility]: 05-02-15   placed on Dr. Eilleen Kempf desk (purple folder)

## 2015-05-03 NOTE — Telephone Encounter (Signed)
Received a request from [facility]    This request has been faxed to [facility]. (See attached form for specifics)    Per [facility], they will release medical record within 30 days.  If the request requires more information, they will contact you, the requester.   If you have not received the record within that time frame, please contact [facility] directly @ [phone]. Option 1.

## 2015-05-06 ENCOUNTER — Other Ambulatory Visit: Payer: Self-pay | Admitting: *Deleted

## 2015-05-06 MED ORDER — ONETOUCH ULTRASOFT LANCETS MISC: Status: DC

## 2015-05-13 ENCOUNTER — Encounter: Payer: Self-pay | Admitting: Gastroenterology

## 2015-05-13 ENCOUNTER — Encounter: Payer: Self-pay | Admitting: Internal Medicine

## 2015-05-18 ENCOUNTER — Encounter: Payer: Self-pay | Admitting: *Deleted

## 2015-06-27 ENCOUNTER — Other Ambulatory Visit: Payer: Self-pay | Admitting: Family Medicine

## 2015-06-27 NOTE — Telephone Encounter (Signed)
LOV: 04/27/15 NOV: 10/27/15  RF request for lisinopril Last written: 06/08/14 #90 w/ 0RF  RF request for metformin Last written: 05/21/13 #180 w/ 3RF

## 2015-07-14 NOTE — Progress Notes (Signed)
Patient ID: Ricardo Washington, male   DOB: 1935/03/08, 80 y.o.   MRN: [id number] [name] is seen today post CABG. Has not been seen in 3 years   CABG 03/21/11  by Dr Roxan Hockey. He had a lima to lad, svg PDA/PLA and svg OM. His LV functoin is preserved. He is active He is a member of a gym in [place] and will continue there for the new year.  He has not had any SSCP, palpitations or dizzyness. He has been compliant with his meds. Samples of Crestor were given since he is in the donut hole. He has multiple somatic complaints including lower back pain. He was told it is ok to go out in hot weather, do yard work and golf.  Chol good Eating too much and too much fat.  Still with frequency Encouraged him to see [facility] and have PSA checked  Has been in [place] with son but wants to live in [place].  Started on Aricept and pulse a bit low  ROS: Denies fever, malais, weight loss, blurry vision, decreased visual acuity, cough, sputum, SOB, hemoptysis, pleuritic pain, palpitaitons, heartburn, abdominal pain, melena, lower extremity edema, claudication, or rash.  All other systems reviewed and negative  General: Affect appropriate Overweight white male HEENT: normal Neck supple with no adenopathy JVP normal no bruits no thyromegaly Lungs clear with no wheezing and good diaphragmatic motion Heart:  S1/S2 no murmur, no rub, gallop or click PMI normal Abdomen: benighn, BS positve, no tenderness, no AAA no bruit.  No HSM or HJR Distal pulses intact with no bruits No edema Neuro non-focal Skin warm and dry No muscular weakness   Current Outpatient Prescriptions  Medication Sig Dispense Refill  . allopurinol (ZYLOPRIM) 100 MG tablet Take 100 mg by mouth daily.    Marland Kitchen ALPRAZolam (NIRAVAM) 0.25 MG dissolvable tablet Take 0.25 mg by mouth at bedtime as needed (for sleep).     Marland Kitchen aspirin 325 MG tablet Take 325 mg by mouth daily.      Marland Kitchen donepezil (ARICEPT) 10 MG tablet Take 10 mg by mouth at bedtime.    .  finasteride (PROSCAR) 5 MG tablet Take 5 mg by mouth daily.    . indomethacin (INDOCIN) 25 MG capsule Take 25 mg by mouth daily as needed for mild pain or moderate pain.    Marland Kitchen lisinopril (PRINIVIL,ZESTRIL) 5 MG tablet TAKE ONE TABLET BY MOUTH EVERY DAY 90 tablet 1  . Lysine 500 MG TABS Take 1 tablet by mouth daily as needed (for raw mouth).     . metFORMIN (GLUCOPHAGE-XR) 500 MG 24 hr tablet TAKE ONE TABLET BY MOUTH TWICE DAILY with meals 180 tablet 1  . metoprolol tartrate (LOPRESSOR) 25 MG tablet Take 25 mg by mouth 2 (two) times daily.    . metroNIDAZOLE (METROCREAM) 0.75 % cream Apply topically 2 (two) times daily.    . MULTIPLE VITAMIN PO Take 1 tablet by mouth daily.     Marland Kitchen oxybutynin (DITROPAN-XL) 10 MG 24 hr tablet Take 10 mg by mouth at bedtime.    . psyllium (METAMUCIL SMOOTH TEXTURE) 28 % packet Take 1 packet by mouth 2 (two) times daily.      . ranitidine (ZANTAC) 150 MG tablet Take 150 mg by mouth daily as needed for heartburn.    . rosuvastatin (CRESTOR) 10 MG tablet Take 10 mg by mouth daily.    . sodium fluoride (DENTA 5000 PLUS) 1.1 % CREA dental cream Place 1 application onto teeth every evening.    Marland Kitchen  tamsulosin (FLOMAX) 0.4 MG CAPS capsule Take 0.4 mg by mouth daily after supper.     No current facility-administered medications for this visit.    Allergies  Sulfonamide derivatives  Electrocardiogram:  NSR rate 70 normal ECG  07/18/15  SR rate 51  Normal otherwise   Assessment and Plan CAD/CABG:  Stable with no angina and good activity level.  Continue medical Rx DM: Discussed low carb diet.  Target hemoglobin A1c is 6.5 or less.  Continue current medications. Prostatism  F/u primary check PSA HTN: Well controlled.  Continue current medications and low sodium Dash type diet.   Chol:   Lab Results  Component Value Date   LDLCALC 20 08/05/2012  Indicates labs in [place] have been fine Bradycardia:  On Aricept now  Decrease lopressor to daily   F/u with me in a year

## 2015-07-18 ENCOUNTER — Ambulatory Visit (INDEPENDENT_AMBULATORY_CARE_PROVIDER_SITE_OTHER): Payer: Medicare Other | Admitting: Cardiovascular Disease

## 2015-07-18 ENCOUNTER — Ambulatory Visit: Payer: Medicare Other | Admitting: Cardiovascular Disease

## 2015-07-18 ENCOUNTER — Encounter: Payer: Self-pay | Admitting: Cardiovascular Disease

## 2015-07-18 ENCOUNTER — Other Ambulatory Visit: Payer: Self-pay | Admitting: Family Medicine

## 2015-07-18 VITALS — BP 120/60 | HR 51 | Ht 66.0 in | Wt 171.1 lb

## 2015-07-18 DIAGNOSIS — I10 Essential (primary) hypertension: Secondary | ICD-10-CM

## 2015-07-18 NOTE — Patient Instructions (Signed)
Medication Instructions:  DECREASE  METOPROLOL TO  1  TAB  EVERY DAY   Labwork: NONE  Testing/Procedures: NONE  Follow-Up: Your physician wants you to follow-up in: [place] will receive a reminder letter in the mail two months in advance. If you don't receive a letter, please call our office to schedule the follow-up appointment. Any Other Special Instructions Will Be Listed Below (If Applicable).

## 2015-07-18 NOTE — Telephone Encounter (Signed)
RF request for allopurinol LOV: 04/27/15 Next ov: 10/27/15 Last written: unknown

## 2015-07-20 NOTE — Addendum Note (Signed)
Addended by: Thompson Grayer on: 07/20/2015 05:49 PM   Modules accepted: Orders

## 2015-08-10 ENCOUNTER — Encounter: Payer: Self-pay | Admitting: Family Medicine

## 2015-08-24 ENCOUNTER — Ambulatory Visit (INDEPENDENT_AMBULATORY_CARE_PROVIDER_SITE_OTHER): Payer: Medicare Other

## 2015-08-24 DIAGNOSIS — Z23 Encounter for immunization: Secondary | ICD-10-CM | POA: Diagnosis not present

## 2015-09-05 NOTE — Telephone Encounter (Signed)
Removed pcp

## 2015-09-05 NOTE — Telephone Encounter (Signed)
Patient returned call.  Did not see an encounter.

## 2015-09-05 NOTE — Telephone Encounter (Signed)
FYI--The patient called to inform our office that he is no longer a patient here. He moved to [place] 6 months ago and has a new primary care where he currently lives at.

## 2015-09-29 NOTE — Telephone Encounter (Signed)
David Beasley,     To remove patients from this type of outreach you should change the PCP listed to PCP unknown or awaiting assignment.  You can also choose their communication preference as Do Not Contact.    Thanks,   [employer]

## 2015-09-29 NOTE — Telephone Encounter (Signed)
Why am i getting this? Why asre they still bothering this poor man???

## 2015-09-29 NOTE — Telephone Encounter (Signed)
-----   Message from [name], [place] sent at 09/19/2015  3:04 PM EDT -----  Regarding: FW: Non-Urgent Medical Question  Contact: [phone]      ----- Message -----     From: David Beasley     Sent: 09/16/2015   6:02 PM       To: [facility]  Subject: Non-Urgent Medical Question                      I keep getting messaging to go something with your office.  You need to be aware that I have moved to [facility] as I came into your office and signed a notice before I left.  Please be aware that I am no longer in [place].  I am in [place].

## 2015-10-24 ENCOUNTER — Other Ambulatory Visit: Payer: Self-pay | Admitting: Family Medicine

## 2015-10-24 NOTE — Telephone Encounter (Signed)
LOV: 04/27/15 NOV: 10/27/15  RF request for donepezil Last written: unknown  RF request for metformin Last written: 06/27/15 #180 w/ 1RF

## 2015-10-27 ENCOUNTER — Ambulatory Visit (INDEPENDENT_AMBULATORY_CARE_PROVIDER_SITE_OTHER): Payer: Medicare Other | Admitting: Family Medicine

## 2015-10-27 ENCOUNTER — Encounter: Payer: Self-pay | Admitting: Family Medicine

## 2015-10-27 VITALS — BP 126/74 | HR 58 | Temp 97.8°F | Resp 16 | Ht 66.0 in | Wt 170.8 lb

## 2015-10-27 DIAGNOSIS — N401 Enlarged prostate with lower urinary tract symptoms: Secondary | ICD-10-CM

## 2015-10-27 DIAGNOSIS — N3281 Overactive bladder: Secondary | ICD-10-CM

## 2015-10-27 DIAGNOSIS — I2510 Atherosclerotic heart disease of native coronary artery without angina pectoris: Secondary | ICD-10-CM

## 2015-10-27 DIAGNOSIS — N138 Other obstructive and reflux uropathy: Secondary | ICD-10-CM

## 2015-10-27 DIAGNOSIS — I10 Essential (primary) hypertension: Secondary | ICD-10-CM

## 2015-10-27 DIAGNOSIS — E119 Type 2 diabetes mellitus without complications: Secondary | ICD-10-CM | POA: Diagnosis not present

## 2015-10-27 DIAGNOSIS — E785 Hyperlipidemia, unspecified: Secondary | ICD-10-CM

## 2015-10-27 HISTORY — DX: Other obstructive and reflux uropathy: N13.8

## 2015-10-27 LAB — MICROALBUMIN / CREATININE URINE RATIO
CREATININE, U: 216.9 mg/dL
MICROALB/CREAT RATIO: 0.3 mg/g (ref 0.0–30.0)
Microalb, Ur: 0.7 mg/dL (ref 0.0–1.9)

## 2015-10-27 LAB — BASIC METABOLIC PANEL
BUN: 14 mg/dL (ref 6–23)
CALCIUM: 9.1 mg/dL (ref 8.4–10.5)
CO2: 27 meq/L (ref 19–32)
Chloride: 105 mEq/L (ref 96–112)
Creatinine, Ser: 0.99 mg/dL (ref 0.40–1.50)
GFR: 77.53 mL/min (ref >60.00)
GLUCOSE: 91 mg/dL (ref 70–99)
POTASSIUM: 4.7 meq/L (ref 3.5–5.1)
SODIUM: 141 meq/L (ref 135–145)

## 2015-10-27 LAB — HEMOGLOBIN A1C: HEMOGLOBIN A1C: 6 % (ref 4.6–6.5)

## 2015-10-27 NOTE — Progress Notes (Signed)
OFFICE VISIT  10/27/2015   CC:  Chief Complaint  Patient presents with  . Follow-up    Pt is not fasting.    HPI:    Patient is a 80 y.o. Caucasian male who presents for 6 mo f/u DM 2, HTN, HLD. He had cardiology f/u since i last saw him, med mgmt continued and the only change was to decrease lopressor to qd since he was on aricept.   His weight is stable since I last saw him in June this year.  Appetite is fine.  He says he now wants to try to put on some weight.  Fasting gluc 100-110 consistently.  Does not monitor bp much, but only occ check is around 120/60.   Takes crestor faithfully w/out problem but is not fasting today for recheck of his cholesterol.   ROS: chronic urinary urgency/ frequency, is on oxybutynin, saw urologist once in the area, he thinks it was Dr. Liam Rogers get him back to him for f/u.   No dysuria or hematuria. No CP, palpitations, dizziness, or SOB/DOE.  Past Medical History  Diagnosis Date  . Cataract   . S/P coronary artery bypass graft x 3   . Coronary artery disease   . Fatigue   . Past use of tobacco     quit 1984  . Diverticulosis of colon     severe, entire colon  . History of adenomatous polyp of colon 2002  . Rectal bleeding   . Hyperlipidemia   . Hypogonadism male   . Gout     always 2nd toe L foot (uric acid 6.20 Dec 2014 per old records)  . Diabetes mellitus without complication ([place])   . History of stomach ulcers   . Urine incontinence   . Dementia   . Hypertension   . BPH with obstruction/lower urinary tract symptoms 10/27/2015    Past Surgical History  Procedure Laterality Date  . Tonsillectomy    . Vasectomy    . Umbilical hernia repair      2009  . Cataract extraction, bilateral    . Ptca    . Coronary artery bypass graft  06/2009    descending,saphenous vein graft to first obtuse marginal, sequential saphenous vein graft to posterior descending and posterolateral  . Endoscopic vein harvest right thigh    .  Colonoscopy  2002, 2006, 02/25/2009    Polyp x 1 2002, none 2006 or 2010    Outpatient Prescriptions Prior to Visit  Medication Sig Dispense Refill  . allopurinol (ZYLOPRIM) 100 MG tablet TAKE ONE TABLET BY MOUTH ON MONDAY, WEDNESDAY, AND FRIDAY 30 tablet 3  . ALPRAZolam (NIRAVAM) 0.25 MG dissolvable tablet Take 0.25 mg by mouth at bedtime as needed (for sleep).     Marland Kitchen aspirin 325 MG tablet Take 325 mg by mouth daily.      Marland Kitchen donepezil (ARICEPT ODT) 5 MG disintegrating tablet DISSOLVE ONE TABLET BY MOUTH AT BEDTIME 30 tablet 6  . finasteride (PROSCAR) 5 MG tablet Take 5 mg by mouth daily.    Marland Kitchen lisinopril (PRINIVIL,ZESTRIL) 5 MG tablet TAKE ONE TABLET BY MOUTH EVERY DAY 90 tablet 1  . metFORMIN (GLUCOPHAGE-XR) 500 MG 24 hr tablet TAKE ONE TABLET BY MOUTH TWICE DAILY WITH MEALS 180 tablet 6  . metoprolol tartrate (LOPRESSOR) 25 MG tablet Take 25 mg by mouth daily.     . metroNIDAZOLE (METROCREAM) 0.75 % cream Apply topically 2 (two) times daily.    . MULTIPLE VITAMIN PO Take 1 tablet  by mouth daily.     Marland Kitchen oxybutynin (DITROPAN-XL) 10 MG 24 hr tablet Take 10 mg by mouth at bedtime.    . rosuvastatin (CRESTOR) 10 MG tablet Take 10 mg by mouth daily.    . sodium fluoride (DENTA 5000 PLUS) 1.1 % CREA dental cream Place 1 application onto teeth every evening.    . tamsulosin (FLOMAX) 0.4 MG CAPS capsule Take 0.4 mg by mouth daily after supper.    . donepezil (ARICEPT) 10 MG tablet Take 10 mg by mouth at bedtime.    . indomethacin (INDOCIN) 25 MG capsule Take 25 mg by mouth daily as needed for mild pain or moderate pain.    Marland Kitchen Lysine 500 MG TABS Take 1 tablet by mouth daily as needed (for raw mouth).     . psyllium (METAMUCIL SMOOTH TEXTURE) 28 % packet Take 1 packet by mouth 2 (two) times daily.      . ranitidine (ZANTAC) 150 MG tablet Take 150 mg by mouth daily as needed for heartburn.     No facility-administered medications prior to visit.    Allergies  Allergen Reactions  . Sulfonamide  Derivatives     Unknown, per pt and "cant stand it"    ROS As per HPI  PE: Blood pressure 126/74, pulse 58, temperature 97.8 F (36.6 C), temperature source Oral, resp. rate 16, height 5' 6"  (1.676 m), weight 170 lb 12 oz (77.452 kg), SpO2 97 %. Gen: Alert, well appearing.  Patient is oriented to person, place, time, and situation. Foot exam - both normal except on L foot his second toe deviates some towards 3rd toe and sometimes overlaps it a bit.  No skin breakdown; no swelling, tenderness or skin or vascular lesions. Color and temperature is normal. Sensation is intact in R foot but diminished in 1st and 2nd toes and metatarsal head regions on L foot. Peripheral pulses are palpable. Toenails are normal.  LABS:  Lab Results  Component Value Date   TSH 4.19 04/27/2015   Lab Results  Component Value Date   WBC 5.8 04/27/2015   HGB 13.3 04/27/2015   HCT 39.4 04/27/2015   MCV 99.8 04/27/2015   PLT 181.0 04/27/2015   Lab Results  Component Value Date   CREATININE 0.95 04/27/2015   BUN 13 04/27/2015   NA 139 04/27/2015   K 4.3 04/27/2015   CL 105 04/27/2015   CO2 27 04/27/2015   Lab Results  Component Value Date   ALT 12 04/27/2015   AST 16 04/27/2015   ALKPHOS 76 04/27/2015   BILITOT 0.5 04/27/2015   Lab Results  Component Value Date   CHOL 95 08/05/2012   Lab Results  Component Value Date   HDL 39.60 08/05/2012   Lab Results  Component Value Date   LDLCALC 20 08/05/2012   Lab Results  Component Value Date   TRIG 175.0* 08/05/2012   Lab Results  Component Value Date   CHOLHDL 2 08/05/2012    IMPRESSION AND PLAN:  1) DM 2, control good. 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 Urine microalb/cr today. Referred to eye MD for diab retpthy screening.  2) HTN; The current medical regimen is effective;  continue present plan and medications.  3) HLD: tolerating statin.  Hopefully next f/u visit he can come in fasting and we'll get recheck  of cholesterol panel.  AST/ALT normal 6 mo ago.  4) Urinary complaints: currently being treated for combo of OAB and BPH with lower  urinary tract sx's. Still seems to be not well controlled.  Will refer him back to urologist he has seen in the past (Dr. Karsten Ro).  5) CAD, hx of CABG, EF preserved:  Doing well on current med mgmt.  He is s/p recent f/u with his heart MD, Dr. Johnsie Cancel, at which time his metoprolol was changed to qd dosing instead of bid (since he is now on aricept).  An After Visit Summary was printed and given to the patient.  FOLLOW UP: Return in about 4 months (around 02/25/2016) for routine chronic illness f/u--fasting please.

## 2015-10-27 NOTE — Progress Notes (Signed)
Pre visit review using our clinic review tool, if applicable. No additional management support is needed unless otherwise documented below in the visit note. 

## 2015-12-05 ENCOUNTER — Other Ambulatory Visit: Payer: Self-pay | Admitting: Family Medicine

## 2015-12-05 NOTE — Telephone Encounter (Signed)
RF request for lisinopril LOV: 10/27/15 Next ov: 02/20/16 Last written: 06/27/15 #90 w/ 1RF

## 2015-12-08 DIAGNOSIS — Z0000 Encounter for general adult medical examination without abnormal findings: Secondary | ICD-10-CM | POA: Diagnosis not present

## 2015-12-08 DIAGNOSIS — N138 Other obstructive and reflux uropathy: Secondary | ICD-10-CM | POA: Diagnosis not present

## 2015-12-08 DIAGNOSIS — N5201 Erectile dysfunction due to arterial insufficiency: Secondary | ICD-10-CM | POA: Diagnosis not present

## 2015-12-08 DIAGNOSIS — R3915 Urgency of urination: Secondary | ICD-10-CM | POA: Diagnosis not present

## 2015-12-08 DIAGNOSIS — N401 Enlarged prostate with lower urinary tract symptoms: Secondary | ICD-10-CM | POA: Diagnosis not present

## 2015-12-12 ENCOUNTER — Encounter: Payer: Self-pay | Admitting: Family Medicine

## 2015-12-15 DIAGNOSIS — H26493 Other secondary cataract, bilateral: Secondary | ICD-10-CM | POA: Diagnosis not present

## 2015-12-15 DIAGNOSIS — H524 Presbyopia: Secondary | ICD-10-CM | POA: Diagnosis not present

## 2015-12-15 LAB — HM DIABETES EYE EXAM

## 2015-12-21 ENCOUNTER — Other Ambulatory Visit: Payer: Self-pay | Admitting: Family Medicine

## 2015-12-21 NOTE — Telephone Encounter (Signed)
RF request for generic crestor LOV: 10/27/15 Next ov: 02/20/16 Last written: 05/21/13 #90 w/ 3RF

## 2015-12-22 ENCOUNTER — Other Ambulatory Visit: Payer: Self-pay | Admitting: Family Medicine

## 2015-12-22 NOTE — Telephone Encounter (Signed)
RF request for oxybutynin LOV: 10/27/15 Next ov: 02/20/16 Last written: unknown

## 2015-12-23 DIAGNOSIS — L602 Onychogryphosis: Secondary | ICD-10-CM | POA: Diagnosis not present

## 2015-12-23 DIAGNOSIS — E1351 Other specified diabetes mellitus with diabetic peripheral angiopathy without gangrene: Secondary | ICD-10-CM | POA: Diagnosis not present

## 2015-12-23 DIAGNOSIS — M2042 Other hammer toe(s) (acquired), left foot: Secondary | ICD-10-CM | POA: Diagnosis not present

## 2015-12-23 DIAGNOSIS — I70293 Other atherosclerosis of native arteries of extremities, bilateral legs: Secondary | ICD-10-CM | POA: Diagnosis not present

## 2015-12-23 DIAGNOSIS — L84 Corns and callosities: Secondary | ICD-10-CM | POA: Diagnosis not present

## 2016-02-20 ENCOUNTER — Ambulatory Visit (INDEPENDENT_AMBULATORY_CARE_PROVIDER_SITE_OTHER): Payer: Medicare Other

## 2016-02-20 ENCOUNTER — Encounter: Payer: Self-pay | Admitting: Family Medicine

## 2016-02-20 ENCOUNTER — Ambulatory Visit (INDEPENDENT_AMBULATORY_CARE_PROVIDER_SITE_OTHER): Payer: Medicare Other | Admitting: Family Medicine

## 2016-02-20 VITALS — BP 112/66 | HR 56 | Temp 97.7°F | Resp 16 | Ht 66.0 in | Wt 174.5 lb

## 2016-02-20 DIAGNOSIS — S20211A Contusion of right front wall of thorax, initial encounter: Secondary | ICD-10-CM | POA: Diagnosis not present

## 2016-02-20 DIAGNOSIS — E785 Hyperlipidemia, unspecified: Secondary | ICD-10-CM | POA: Diagnosis not present

## 2016-02-20 DIAGNOSIS — X58XXXA Exposure to other specified factors, initial encounter: Secondary | ICD-10-CM | POA: Diagnosis not present

## 2016-02-20 DIAGNOSIS — I10 Essential (primary) hypertension: Secondary | ICD-10-CM

## 2016-02-20 DIAGNOSIS — E118 Type 2 diabetes mellitus with unspecified complications: Secondary | ICD-10-CM

## 2016-02-20 DIAGNOSIS — S299XXA Unspecified injury of thorax, initial encounter: Secondary | ICD-10-CM | POA: Diagnosis not present

## 2016-02-20 DIAGNOSIS — R0781 Pleurodynia: Secondary | ICD-10-CM | POA: Diagnosis not present

## 2016-02-20 LAB — POCT GLYCOSYLATED HEMOGLOBIN (HGB A1C): Hemoglobin A1C: 5.6

## 2016-02-20 MED ORDER — METFORMIN HCL ER 500 MG PO TB24: 500.0000 mg | ORAL_TABLET | Freq: Every day | ORAL | Status: DC

## 2016-02-20 NOTE — Progress Notes (Signed)
OFFICE VISIT  02/20/2016   CC:  Chief Complaint  Patient presents with  . Follow-up    Pt is not fasting.     HPI:    Patient is a 81 y.o. Caucasian male who presents for 4 mo f/u DM 2, HTN, HLD.  He is not fasting today.   Glucose check every few days 100-110.  No checks later in the day. Compliant with metformin.  He got his eye exam 1 mo ago.  No diab retinpathy detected.  No home bp monitoring since last visit.  Compliant with bp and cholesterol med w/out side effect.  Two days ago he was lugging around heavy bags of dirt in his yard, was rushing at one point to get flags in place to mark where his sprinklers are and he fell forward onto his chest/face twice.  R lower chest wall hurting since.  Took tylenol and it helped some.  Getting a little better over last 2d.  No SOB.  No hemoptysis. No fever.  Past Medical History  Diagnosis Date  . Cataract   . S/P coronary artery bypass graft x 3   . Coronary artery disease   . Fatigue   . Past use of tobacco     quit 1984  . Diverticulosis of colon     severe, entire colon  . History of adenomatous polyp of colon 2002  . Rectal bleeding   . Hyperlipidemia   . Hypogonadism male   . Gout     always 2nd toe L foot (uric acid 6.20 Dec 2014 per old records)  . Diabetes mellitus without complication ([place])   . History of stomach ulcers   . Urine incontinence     Dr. Karsten Ro started myrbetriq 12/08/15  . Dementia   . Hypertension   . BPH with obstruction/lower urinary tract symptoms 10/27/2015    Dr. Karsten Ro  . Erectile dysfunction due to arterial insufficiency     Past Surgical History  Procedure Laterality Date  . Tonsillectomy    . Vasectomy    . Umbilical hernia repair      2009  . Cataract extraction, bilateral    . Ptca    . Coronary artery bypass graft  06/2009    descending,saphenous vein graft to first obtuse marginal, sequential saphenous vein graft to posterior descending and posterolateral  . Endoscopic vein  harvest right thigh    . Colonoscopy  2002, 2006, 02/25/2009    Polyp x 1 2002, none 2006 or 2010    Outpatient Prescriptions Prior to Visit  Medication Sig Dispense Refill  . allopurinol (ZYLOPRIM) 100 MG tablet TAKE ONE TABLET BY MOUTH ON MONDAY, WEDNESDAY, AND FRIDAY 30 tablet 3  . ALPRAZolam (NIRAVAM) 0.25 MG dissolvable tablet Take 0.25 mg by mouth at bedtime as needed (for sleep).     Marland Kitchen aspirin 325 MG tablet Take 325 mg by mouth daily.      Marland Kitchen donepezil (ARICEPT ODT) 5 MG disintegrating tablet DISSOLVE ONE TABLET BY MOUTH AT BEDTIME 30 tablet 6  . finasteride (PROSCAR) 5 MG tablet Take 5 mg by mouth daily.    Marland Kitchen lisinopril (PRINIVIL,ZESTRIL) 5 MG tablet TAKE ONE TABLET BY MOUTH EVERY DAY 90 tablet 3  . metFORMIN (GLUCOPHAGE-XR) 500 MG 24 hr tablet TAKE ONE TABLET BY MOUTH TWICE DAILY WITH MEALS 180 tablet 6  . metoprolol tartrate (LOPRESSOR) 25 MG tablet Take 25 mg by mouth daily.     . metroNIDAZOLE (METROCREAM) 0.75 % cream Apply topically 2 (  two) times daily.    . MULTIPLE VITAMIN PO Take 1 tablet by mouth daily.     Marland Kitchen oxybutynin (DITROPAN-XL) 10 MG 24 hr tablet TAKE ONE TABLET BY MOUTH DAILY 90 tablet 1  . rosuvastatin (CRESTOR) 10 MG tablet TAKE ONE TABLET BY MOUTH EVERY DAY 90 tablet 3  . sodium fluoride (DENTA 5000 PLUS) 1.1 % CREA dental cream Place 1 application onto teeth every evening.    . tamsulosin (FLOMAX) 0.4 MG CAPS capsule Take 0.4 mg by mouth daily after supper.     No facility-administered medications prior to visit.    Allergies  Allergen Reactions  . Sulfonamide Derivatives     Unknown, per pt and "cant stand it"    ROS As per HPI  PE: Blood pressure 112/66, pulse 56, temperature 97.7 F (36.5 C), temperature source Oral, resp. rate 16, height 5' 6"  (1.676 m), weight 174 lb 8 oz (79.153 kg), SpO2 96 %. Gen: Alert, well appearing.  Patient is oriented to person, place, time, and situation. CV: RRR, no m/r/g.   LUNGS: CTA bilat, nonlabored resps, good  aeration in all lung fields. R lower 2 ribs with TTP over them in anterolateral portions, minimal R side/RUQ soft tissue tenderness to palpation.  No bruising visible.  LABS: Lab Results  Component Value Date   TSH 4.19 04/27/2015   Lab Results  Component Value Date   WBC 5.8 04/27/2015   HGB 13.3 04/27/2015   HCT 39.4 04/27/2015   MCV 99.8 04/27/2015   PLT 181.0 04/27/2015   Lab Results  Component Value Date   CREATININE 0.99 10/27/2015   BUN 14 10/27/2015   NA 141 10/27/2015   K 4.7 10/27/2015   CL 105 10/27/2015   CO2 27 10/27/2015   Lab Results  Component Value Date   ALT 12 04/27/2015   AST 16 04/27/2015   ALKPHOS 76 04/27/2015   BILITOT 0.5 04/27/2015   Lab Results  Component Value Date   CHOL 95 08/05/2012   Lab Results  Component Value Date   HDL 39.60 08/05/2012   Lab Results  Component Value Date   LDLCALC 20 08/05/2012   Lab Results  Component Value Date   TRIG 175.0* 08/05/2012   Lab Results  Component Value Date   CHOLHDL 2 08/05/2012   Lab Results  Component Value Date   HGBA1C 6.0 10/27/2015    IMPRESSION AND PLAN:  1) R chest wall contusion, r/o rib fracture: ordered rib x-ray today. Continue tylenol prn.  2) DM 2, with hx of mild DPN in feet:  POCT A1c today was 5.6%.  Cut back on metformin XR to 542m qd.  3) HTN: The current medical regimen is effective;  continue present plan and medications. Lytes/cr good 10/2015.  4) Hyperlipidemia: tolerating statin.  Asked pt to come in for next f/u in 4 mo fasting so we could recheck lipid panel.  An After Visit Summary was printed and given to the patient.  FOLLOW UP: Return in about 4 months (around 06/21/2016) for routine chronic illness f/u--fasting.

## 2016-02-20 NOTE — Patient Instructions (Signed)
Please come to next follow up visit FASTING so we can check your cholesterol.

## 2016-02-20 NOTE — Progress Notes (Signed)
Pre visit review using our clinic review tool, if applicable. No additional management support is needed unless otherwise documented below in the visit note. 

## 2016-02-28 DIAGNOSIS — L602 Onychogryphosis: Secondary | ICD-10-CM | POA: Diagnosis not present

## 2016-02-28 DIAGNOSIS — I70293 Other atherosclerosis of native arteries of extremities, bilateral legs: Secondary | ICD-10-CM | POA: Diagnosis not present

## 2016-02-28 DIAGNOSIS — E1351 Other specified diabetes mellitus with diabetic peripheral angiopathy without gangrene: Secondary | ICD-10-CM | POA: Diagnosis not present

## 2016-03-07 ENCOUNTER — Other Ambulatory Visit: Payer: Self-pay | Admitting: *Deleted

## 2016-03-07 ENCOUNTER — Encounter: Payer: Self-pay | Admitting: Family Medicine

## 2016-03-07 MED ORDER — METRONIDAZOLE 0.75 % EX CREA: TOPICAL_CREAM | Freq: Two times a day (BID) | CUTANEOUS | Status: DC

## 2016-03-07 NOTE — Telephone Encounter (Signed)
RF request for metronidazole LOV: 02/20/16 Next ov: 06/22/16 Last written: Unknown  Please advise. Thanks.

## 2016-04-30 ENCOUNTER — Other Ambulatory Visit: Payer: Self-pay | Admitting: Family Medicine

## 2016-04-30 DIAGNOSIS — E1351 Other specified diabetes mellitus with diabetic peripheral angiopathy without gangrene: Secondary | ICD-10-CM | POA: Diagnosis not present

## 2016-04-30 DIAGNOSIS — L602 Onychogryphosis: Secondary | ICD-10-CM | POA: Diagnosis not present

## 2016-05-07 ENCOUNTER — Other Ambulatory Visit: Payer: Self-pay | Admitting: Family Medicine

## 2016-05-07 NOTE — Telephone Encounter (Signed)
Rx sent this morning but request returned, requesting 90 day supply. Rx sent #90 w/ 3RF.

## 2016-05-07 NOTE — Telephone Encounter (Signed)
RF request for donepezil LOV: 02/20/16 Next ov: 06/22/16 Last written: 10/24/15 330 w/ 6RF

## 2016-06-04 ENCOUNTER — Encounter: Payer: Self-pay | Admitting: Cardiovascular Disease

## 2016-06-22 ENCOUNTER — Ambulatory Visit: Payer: Medicare Other | Admitting: Family Medicine

## 2016-06-25 ENCOUNTER — Other Ambulatory Visit: Payer: Self-pay | Admitting: Family Medicine

## 2016-06-25 ENCOUNTER — Ambulatory Visit: Payer: Medicare Other | Admitting: Family Medicine

## 2016-07-02 ENCOUNTER — Ambulatory Visit (INDEPENDENT_AMBULATORY_CARE_PROVIDER_SITE_OTHER): Payer: Medicare Other | Admitting: Family Medicine

## 2016-07-02 ENCOUNTER — Encounter: Payer: Self-pay | Admitting: Family Medicine

## 2016-07-02 VITALS — BP 112/65 | HR 64 | Temp 98.1°F | Resp 16 | Ht 66.0 in | Wt 163.0 lb

## 2016-07-02 DIAGNOSIS — F0390 Unspecified dementia without behavioral disturbance: Secondary | ICD-10-CM

## 2016-07-02 DIAGNOSIS — E119 Type 2 diabetes mellitus without complications: Secondary | ICD-10-CM | POA: Diagnosis not present

## 2016-07-02 DIAGNOSIS — Z23 Encounter for immunization: Secondary | ICD-10-CM | POA: Diagnosis not present

## 2016-07-02 DIAGNOSIS — E785 Hyperlipidemia, unspecified: Secondary | ICD-10-CM

## 2016-07-02 DIAGNOSIS — I10 Essential (primary) hypertension: Secondary | ICD-10-CM | POA: Diagnosis not present

## 2016-07-02 MED ORDER — DONEPEZIL HCL 10 MG PO TBDP: 10.0000 mg | ORAL_TABLET | Freq: Every day | ORAL | 11 refills | Status: DC

## 2016-07-02 MED ORDER — INDOMETHACIN 25 MG PO CAPS: ORAL_CAPSULE | ORAL | 1 refills | Status: DC

## 2016-07-02 NOTE — Progress Notes (Signed)
Pre visit review using our clinic review tool, if applicable. No additional management support is needed unless otherwise documented below in the visit note. 

## 2016-07-02 NOTE — Progress Notes (Signed)
OFFICE VISIT  07/02/2016   CC:  Chief Complaint  Patient presents with  . Follow-up    HPI:    Patient is a 81 y.o. Caucasian male who presents for 4 mo f/u DM 2, HTN, HLD.  Not fasting today. Last visit we cut his metformin xr dose back to 500 mg qd b/c his A1c was 5.6%.  Checks fasting gluc about once a week: avg 115. He saw some rise in fasting gluc to 130s when he went to qd metformin dosing, so he went back to bid dosing and they have improved. No home bp monitoring.  Compliant with bp med. Tolerating crestor 20m qd.    Recently had left sided big toe gout, says this is where he gets it every time---he is not able to tell me how often he gets it.  Indomethacin 234mhelps this very well/quickly.  He asks for rx of this today.  Says his dementia is progressing some.  More problems with thinking of words for things and also with navigating while driving.  Has been on aricept odt 51m14mor about the last 2 yrs or so.   Past Medical History:  Diagnosis Date  . BPH with obstruction/lower urinary tract symptoms 10/27/2015   Dr. OttKarsten Ro Cataract   . Coronary artery disease   . Dementia   . [place] Diverticulosis of colon    severe, entire colon  . Erectile dysfunction due to arterial insufficiency   . Fatigue   . Gout    always 2nd toe L foot (uric acid 6.20 Dec 2014 per old records)  . History of adenomatous polyp of colon 2002  . History of stomach ulcers   . Hyperlipidemia   . Hypertension   . Hypogonadism male   . Past use of tobacco    quit 1984  . Rectal bleeding   . Rosacea   . S/P coronary artery bypass graft x 3   . Urine incontinence    Dr. OttKarsten Ro Past Surgical History:  Procedure Laterality Date  . CATARACT EXTRACTION, BILATERAL    . COLONOSCOPY  2002, 2006, 02/25/2009   Polyp x 1 2002, none 2006 or 2010  . CORONARY ARTERY BYPASS GRAFT  06/2009   descending,saphenous vein graft to first obtuse marginal,  sequential saphenous vein graft to posterior descending and posterolateral  . Endoscopic vein harvest right thigh    . PTCA    . TONSILLECTOMY    . UMBILICAL HERNIA REPAIR     2009  . VASECTOMY      Outpatient Medications Prior to Visit  Medication Sig Dispense Refill  . allopurinol (ZYLOPRIM) 100 MG tablet TAKE ONE TABLET BY MOUTH ON MONDAY, WEDNESDAY, AND FRIDAY 30 tablet 3  . ALPRAZolam (NIRAVAM) 0.25 MG dissolvable tablet Take 0.25 mg by mouth at bedtime as needed (for sleep).     . aMarland Kitchenpirin 325 MG tablet Take 325 mg by mouth daily.      . finasteride (PROSCAR) 5 MG tablet TAKE ONE TABLET BY MOUTH DAILY 90 tablet 0  . lisinopril (PRINIVIL,ZESTRIL) 5 MG tablet TAKE ONE TABLET BY MOUTH EVERY DAY 90 tablet 3  . metFORMIN (GLUCOPHAGE-XR) 500 MG 24 hr tablet Take 1 tablet (500 mg total) by mouth daily with breakfast. 180 tablet 6  . metoprolol tartrate (LOPRESSOR) 25 MG tablet Take 25 mg by mouth daily.     . metroNIDAZOLE (METROCREAM) 0.75 % cream Apply topically 2 (two)  times daily. 45 g 11  . MULTIPLE VITAMIN PO Take 1 tablet by mouth daily.     Marland Kitchen oxybutynin (DITROPAN-XL) 10 MG 24 hr tablet TAKE ONE TABLET BY MOUTH DAILY 90 tablet 0  . rosuvastatin (CRESTOR) 10 MG tablet TAKE ONE TABLET BY MOUTH EVERY DAY 90 tablet 3  . sodium fluoride (DENTA 5000 PLUS) 1.1 % CREA dental cream Place 1 application onto teeth every evening.    . tamsulosin (FLOMAX) 0.4 MG CAPS capsule TAKE ONE CAPSULE BY MOUTH EVERY DAY 90 capsule 0  . donepezil (ARICEPT ODT) 5 MG disintegrating tablet DISSOLVE ONE TABLET BY MOUTH AT BEDTIME 90 tablet 3   No facility-administered medications prior to visit.     Allergies  Allergen Reactions  . Sulfonamide Derivatives     Unknown, per pt and "cant stand it"    ROS As per HPI  PE: Blood pressure 112/65, pulse 64, temperature 98.1 F (36.7 C), temperature source Oral, resp. rate 16, height 5' 6"  (1.676 m), weight 163 lb (73.9 kg), SpO2 97 %. Gen: Alert, well  appearing.  Patient is oriented to person, place, time, and situation. VOZ:DGUY: no injection, icteris, swelling, or exudate.  EOMI, PERRLA. Mouth: lips without lesion/swelling.  Oral mucosa pink and moist. Oropharynx without erythema, exudate, or swelling.  CV: RRR, no m/r/g.   LUNGS: CTA bilat, nonlabored resps, good aeration in all lung fields. EXT: no clubbing, cyanosis, or edema.   LABS:  Lab Results  Component Value Date   TSH 4.19 04/27/2015   Lab Results  Component Value Date   WBC 5.8 04/27/2015   HGB 13.3 04/27/2015   HCT 39.4 04/27/2015   MCV 99.8 04/27/2015   PLT 181.0 04/27/2015   Lab Results  Component Value Date   CREATININE 0.99 10/27/2015   BUN 14 10/27/2015   NA 141 10/27/2015   K 4.7 10/27/2015   CL 105 10/27/2015   CO2 27 10/27/2015   Lab Results  Component Value Date   ALT 12 04/27/2015   AST 16 04/27/2015   ALKPHOS 76 04/27/2015   BILITOT 0.5 04/27/2015   Lab Results  Component Value Date   CHOL 95 08/05/2012   Lab Results  Component Value Date   HDL 39.60 08/05/2012   Lab Results  Component Value Date   LDLCALC 20 08/05/2012   Lab Results  Component Value Date   TRIG 175.0 (H) 08/05/2012   Lab Results  Component Value Date   CHOLHDL 2 08/05/2012   Lab Results  Component Value Date   HGBA1C 5.6 02/20/2016   Lab Results  Component Value Date   LABURIC 6.1 08/05/2012   IMPRESSION AND PLAN:  1) DM 2, good control. He'll continue metformin XR 500 mg bid. Recheck HbA1c with future fasting labs. Pneumovax 23 given today.  2) HTN: The current medical regimen is effective;  continue present plan and medications. Recheck lytes/cr with future fasting labs.  3) Hyperlipidema; recheck FLP and AST/ALT with future fasting labs.    4) Dementia, likely alzheimer's type: mild progression per pt.  Has been on 43m aricept for 2 yrs, so we'll maximize dosing now at 177mqd.  5) Gout: indomethacin rx filled as per pt request.  An After  Visit Summary was printed and given to the patient.  FOLLOW UP: Return in about 4 months (around 11/01/2016) for routine chronic illness f/u: needs lab visit (not 07/03/16) for fasting labs.  Signed:  PhCrissie SicklesMD  07/02/2016     

## 2016-07-04 ENCOUNTER — Other Ambulatory Visit (INDEPENDENT_AMBULATORY_CARE_PROVIDER_SITE_OTHER): Payer: Medicare Other

## 2016-07-04 DIAGNOSIS — E119 Type 2 diabetes mellitus without complications: Secondary | ICD-10-CM | POA: Diagnosis not present

## 2016-07-04 DIAGNOSIS — E1351 Other specified diabetes mellitus with diabetic peripheral angiopathy without gangrene: Secondary | ICD-10-CM | POA: Diagnosis not present

## 2016-07-04 DIAGNOSIS — I10 Essential (primary) hypertension: Secondary | ICD-10-CM | POA: Diagnosis not present

## 2016-07-04 DIAGNOSIS — E785 Hyperlipidemia, unspecified: Secondary | ICD-10-CM

## 2016-07-04 DIAGNOSIS — I70293 Other atherosclerosis of native arteries of extremities, bilateral legs: Secondary | ICD-10-CM | POA: Diagnosis not present

## 2016-07-04 DIAGNOSIS — L602 Onychogryphosis: Secondary | ICD-10-CM | POA: Diagnosis not present

## 2016-07-04 LAB — COMPREHENSIVE METABOLIC PANEL
ALT: 8 U/L (ref 0–53)
AST: 13 U/L (ref 0–37)
Albumin: 3.9 g/dL (ref 3.5–5.2)
Alkaline Phosphatase: 70 U/L (ref 39–117)
BILIRUBIN TOTAL: 0.5 mg/dL (ref 0.2–1.2)
BUN: 15 mg/dL (ref 6–23)
CALCIUM: 9.1 mg/dL (ref 8.4–10.5)
CO2: 24 meq/L (ref 19–32)
CREATININE: 1.01 mg/dL (ref 0.40–1.50)
Chloride: 105 mEq/L (ref 96–112)
GFR: 75.63 mL/min (ref >60.00)
GLUCOSE: 121 mg/dL — AB (ref 70–99)
Potassium: 4.9 mEq/L (ref 3.5–5.1)
SODIUM: 140 meq/L (ref 135–145)
TOTAL PROTEIN: 6.1 g/dL (ref 6.0–8.3)

## 2016-07-04 LAB — HEMOGLOBIN A1C: HEMOGLOBIN A1C: 6 % (ref 4.6–6.5)

## 2016-07-04 LAB — LIPID PANEL
CHOL/HDL RATIO: 2
Cholesterol: 109 mg/dL (ref 0–200)
HDL: 45.9 mg/dL (ref >39.00)
LDL CALC: 50 mg/dL (ref 0–99)
NONHDL: 63.38
Triglycerides: 68 mg/dL (ref 0.0–149.0)
VLDL: 13.6 mg/dL (ref 0.0–40.0)

## 2016-08-07 ENCOUNTER — Encounter: Payer: Self-pay | Admitting: Cardiovascular Disease

## 2016-08-10 ENCOUNTER — Ambulatory Visit (INDEPENDENT_AMBULATORY_CARE_PROVIDER_SITE_OTHER): Payer: Medicare Other

## 2016-08-10 DIAGNOSIS — Z23 Encounter for immunization: Secondary | ICD-10-CM

## 2016-08-14 NOTE — Progress Notes (Signed)
Patient ID: Ricardo Washington, male   DOB: 1934-12-25, 81 y.o.   MRN: [id number]   Ricardo Washington is seen today f/u CAD  CABG 03/21/11  by Dr Roxan Hockey. He had a lima to lad, svg PDA/PLA and svg OM. His LV functoin is preserved. He is active He is a member of a gym in [place] and will continue there for the new year.  He has not had any SSCP, palpitations or dizzyness. He has been compliant with his meds. Samples of Crestor were given since he is in the donut hole.  He has multiple somatic complaints including lower back pain. He was told it is ok to go out in hot weather, do yard work and golf.  Chol good Eating too much and too much fat.  Still with frequency Encouraged him to see Otelin and have PSA checked  Has been in [place] with son but wants to live in [place].  Started on Aricept and pulse was a bit low Beta blocker decreased and improved  Having some left sided chest pains Thinks its muscular Has no nitro. Pain occasionally exertional Not pleuritic or positional gets them about 2-3/month  ROS: Denies fever, malais, weight loss, blurry vision, decreased visual acuity, cough, sputum, SOB, hemoptysis, pleuritic pain, palpitaitons, heartburn, abdominal pain, melena, lower extremity edema, claudication, or rash.  All other systems reviewed and negative  General: Affect appropriate Overweight white male HEENT: normal Neck supple with no adenopathy JVP normal no bruits no thyromegaly Lungs clear with no wheezing and good diaphragmatic motion Heart:  S1/S2 no murmur, no rub, gallop or click PMI normal Abdomen: benighn, BS positve, no tenderness, no AAA no bruit.  No HSM or HJR Distal pulses intact with no bruits No edema Neuro non-focal Skin warm and dry No muscular weakness   Current Outpatient Prescriptions  Medication Sig Dispense Refill  . allopurinol (ZYLOPRIM) 100 MG tablet TAKE ONE TABLET BY MOUTH ON MONDAY, WEDNESDAY, AND FRIDAY 30 tablet 3  . ALPRAZolam (NIRAVAM) 0.25 MG dissolvable  tablet Take 0.25 mg by mouth at bedtime as needed (for sleep).     Marland Kitchen aspirin 325 MG tablet Take 325 mg by mouth daily.      Marland Kitchen donepezil (ARICEPT ODT) 10 MG disintegrating tablet Take 1 tablet (10 mg total) by mouth at bedtime. 30 tablet 11  . finasteride (PROSCAR) 5 MG tablet TAKE ONE TABLET BY MOUTH DAILY 90 tablet 0  . indomethacin (INDOCIN) 25 MG capsule Take 25 mg by mouth 2 (two) times daily as needed for mild pain or moderate pain (gout).    Marland Kitchen lisinopril (PRINIVIL,ZESTRIL) 5 MG tablet TAKE ONE TABLET BY MOUTH EVERY DAY 90 tablet 3  . metFORMIN (GLUCOPHAGE-XR) 500 MG 24 hr tablet Take 1 tablet (500 mg total) by mouth daily with breakfast. 180 tablet 6  . metoprolol tartrate (LOPRESSOR) 25 MG tablet Take 25 mg by mouth daily.     . metroNIDAZOLE (METROCREAM) 0.75 % cream Apply topically 2 (two) times daily. 45 g 11  . MULTIPLE VITAMIN PO Take 1 tablet by mouth daily.     Marland Kitchen oxybutynin (DITROPAN-XL) 10 MG 24 hr tablet TAKE ONE TABLET BY MOUTH DAILY 90 tablet 0  . rosuvastatin (CRESTOR) 10 MG tablet TAKE ONE TABLET BY MOUTH EVERY DAY 90 tablet 3  . sodium fluoride (DENTA 5000 PLUS) 1.1 % CREA dental cream Place 1 application onto teeth every evening.    . tamsulosin (FLOMAX) 0.4 MG CAPS capsule TAKE ONE CAPSULE BY MOUTH EVERY DAY 90  capsule 0  . Trospium Chloride 60 MG CP24 Take 1 capsule by mouth daily.  3  . nitroGLYCERIN (NITROSTAT) 0.4 MG SL tablet Place 1 tablet (0.4 mg total) under the tongue every 5 (five) minutes as needed for chest pain. 25 tablet 3   No current facility-administered medications for this visit.     Allergies  Sulfonamide derivatives  Electrocardiogram:  NSR rate 70 normal ECG  07/18/15  SR rate 51  Normal otherwise 08/20/16   SR rate 55 normal   Assessment and Plan CAD/CABG: 2012  Some left sided pain ECG no change f/u lexiscan myovue new nitro called in   DM: Discussed low carb diet.  Target hemoglobin A1c is 6.5 or less.  Continue current  medications.  Prostatism  F/u primary check PSA  HTN: Well controlled.  Continue current medications and low sodium Dash type diet.    Chol:   Lab Results  Component Value Date   LDLCALC 50 07/04/2016  Indicates labs in [place] have been fine  Bradycardia:  On Aricept now better on lower dose lopressor   F/u with me in a year if myovue ok   [employer]

## 2016-08-17 ENCOUNTER — Ambulatory Visit: Payer: Medicare Other | Admitting: Cardiovascular Disease

## 2016-08-20 ENCOUNTER — Ambulatory Visit (INDEPENDENT_AMBULATORY_CARE_PROVIDER_SITE_OTHER): Payer: Medicare Other | Admitting: Cardiovascular Disease

## 2016-08-20 ENCOUNTER — Encounter: Payer: Self-pay | Admitting: Cardiovascular Disease

## 2016-08-20 VITALS — BP 114/62 | HR 58 | Ht 66.0 in | Wt 161.1 lb

## 2016-08-20 DIAGNOSIS — E784 Other hyperlipidemia: Secondary | ICD-10-CM

## 2016-08-20 DIAGNOSIS — I10 Essential (primary) hypertension: Secondary | ICD-10-CM | POA: Diagnosis not present

## 2016-08-20 DIAGNOSIS — Z09 Encounter for follow-up examination after completed treatment for conditions other than malignant neoplasm: Secondary | ICD-10-CM | POA: Diagnosis not present

## 2016-08-20 DIAGNOSIS — Z951 Presence of aortocoronary bypass graft: Secondary | ICD-10-CM

## 2016-08-20 DIAGNOSIS — R0789 Other chest pain: Secondary | ICD-10-CM

## 2016-08-20 DIAGNOSIS — E7849 Other hyperlipidemia: Secondary | ICD-10-CM

## 2016-08-20 MED ORDER — NITROGLYCERIN 0.4 MG SL SUBL: 0.4000 mg | SUBLINGUAL_TABLET | SUBLINGUAL | 3 refills | Status: DC | PRN

## 2016-08-20 NOTE — Patient Instructions (Addendum)
Medication Instructions:  Your physician has recommended you make the following change in your medication:  1-TAKE Nitroglycerin 0.4 mg under the tongue as needed for chest pain.  Take 1 NTG, under your tongue, while sitting. If no relief of pain may repeat NTG, one tab every 5 minutes up to 3 tablets total over 15 minutes. If no relief CALL 911. If you have dizziness/lightheadness while taking NTG, stop taking and call 911.  Labwork: NONE  Testing/Procedures: Your physician has requested that you have a lexiscan myoview. For further information please visit [email or web address]. Please follow instruction sheet, as given.  Follow-Up: Your physician wants you to follow-up in: 12 months with Dr. Johnsie Cancel. You will receive a reminder letter in the mail two months in advance. If you don't receive a letter, please call our office to schedule the follow-up appointment.   If you need a refill on your cardiac medications before your next appointment, please call your pharmacy.    Nitroglycerin sublingual tablets What is this medicine? NITROGLYCERIN (nye troe GLI ser in) is a type of vasodilator. It relaxes blood vessels, increasing the blood and oxygen supply to your heart. This medicine is used to relieve chest pain caused by angina. It is also used to prevent chest pain before activities like climbing stairs, going outdoors in cold weather, or sexual activity. This medicine may be used for other purposes; ask your health care provider or pharmacist if you have questions. What should I tell my health care provider before I take this medicine? They need to know if you have any of these conditions: -anemia -head injury, recent stroke, or bleeding in the brain -liver disease -previous heart attack -an unusual or allergic reaction to nitroglycerin, other medicines, foods, dyes, or preservatives -pregnant or trying to get pregnant -breast-feeding How should I use this medicine? Take this  medicine by mouth as needed. At the first sign of an angina attack (chest pain or tightness) place one tablet under your tongue. You can also take this medicine 5 to 10 minutes before an event likely to produce chest pain. Follow the directions on the prescription label. Let the tablet dissolve under the tongue. Do not swallow whole. Replace the dose if you accidentally swallow it. It will help if your mouth is not dry. Saliva around the tablet will help it to dissolve more quickly. Do not eat or drink, smoke or chew tobacco while a tablet is dissolving. If you are not better within 5 minutes after taking ONE dose of nitroglycerin, call 9-1-1 immediately to seek emergency medical care. Do not take more than 3 nitroglycerin tablets over 15 minutes. If you take this medicine often to relieve symptoms of angina, your doctor or health care professional may provide you with different instructions to manage your symptoms. If symptoms do not go away after following these instructions, it is important to call 9-1-1 immediately. Do not take more than 3 nitroglycerin tablets over 15 minutes. Talk to your pediatrician regarding the use of this medicine in children. Special care may be needed. Overdosage: If you think you have taken too much of this medicine contact a poison control center or emergency room at once. NOTE: This medicine is only for you. Do not share this medicine with others. What if I miss a dose? This does not apply. This medicine is only used as needed. What may interact with this medicine? Do not take this medicine with any of the following medications: -certain migraine medicines like ergotamine and dihydroergotamine (DHE) -  medicines used to treat erectile dysfunction like sildenafil, tadalafil, and vardenafil -riociguat This medicine may also interact with the following medications: -alteplase -aspirin -heparin -medicines for high blood pressure -medicines for mental depression -other  medicines used to treat angina -phenothiazines like chlorpromazine, mesoridazine, prochlorperazine, thioridazine This list may not describe all possible interactions. Give your health care provider a list of all the medicines, herbs, non-prescription drugs, or dietary supplements you use. Also tell them if you smoke, drink alcohol, or use illegal drugs. Some items may interact with your medicine. What should I watch for while using this medicine? Tell your doctor or health care professional if you feel your medicine is no longer working. Keep this medicine with you at all times. Sit or lie down when you take your medicine to prevent falling if you feel dizzy or faint after using it. Try to remain calm. This will help you to feel better faster. If you feel dizzy, take several deep breaths and lie down with your feet propped up, or bend forward with your head resting between your knees. You may get drowsy or dizzy. Do not drive, use machinery, or do anything that needs mental alertness until you know how this drug affects you. Do not stand or sit up quickly, especially if you are an older patient. This reduces the risk of dizzy or fainting spells. Alcohol can make you more drowsy and dizzy. Avoid alcoholic drinks. Do not treat yourself for coughs, colds, or pain while you are taking this medicine without asking your doctor or health care professional for advice. Some ingredients may increase your blood pressure. What side effects may I notice from receiving this medicine? Side effects that you should report to your doctor or health care professional as soon as possible: -blurred vision -dry mouth -skin rash -sweating -the feeling of extreme pressure in the head -unusually weak or tired Side effects that usually do not require medical attention (report to your doctor or health care professional if they continue or are bothersome): -flushing of the face or neck -headache -irregular heartbeat,  palpitations -nausea, vomiting This list may not describe all possible side effects. Call your doctor for medical advice about side effects. You may report side effects to FDA at 1-800-FDA-1088. Where should I keep my medicine? Keep out of the reach of children. Store at room temperature between 20 and 25 degrees C (68 and 77 degrees F). Store in [facility] [employer]. Protect from light and moisture. Keep tightly closed. Throw away any unused medicine after the expiration date. NOTE: This sheet is a summary. It may not cover all possible information. If you have questions about this medicine, talk to your doctor, pharmacist, or health care provider.    2016, [employer]vier/[employer] Standard. (2013-09-03 17:57:36)

## 2016-08-22 ENCOUNTER — Encounter: Payer: Self-pay | Admitting: Family Medicine

## 2016-08-23 ENCOUNTER — Telehealth (HOSPITAL_COMMUNITY): Payer: Self-pay | Admitting: *Deleted

## 2016-08-23 NOTE — Telephone Encounter (Signed)
Left message on voicemail per DPR in reference to upcoming appointment scheduled on 08/28/16 with detailed instructions given per Myocardial Perfusion Study Information Sheet for the test. LM to arrive 15 minutes early, and that it is imperative to arrive on time for appointment to keep from having the test rescheduled. If you need to cancel or reschedule your appointment, please call the office within 24 hours of your appointment. Failure to do so may result in a cancellation of your appointment, and a $50 no show fee. Phone number given for call back for any questions. Kirstie Peri

## 2016-08-28 ENCOUNTER — Ambulatory Visit (HOSPITAL_COMMUNITY): Payer: Medicare Other | Attending: Cardiovascular Disease

## 2016-08-28 DIAGNOSIS — Z09 Encounter for follow-up examination after completed treatment for conditions other than malignant neoplasm: Secondary | ICD-10-CM

## 2016-08-28 DIAGNOSIS — E119 Type 2 diabetes mellitus without complications: Secondary | ICD-10-CM | POA: Insufficient documentation

## 2016-08-28 DIAGNOSIS — E785 Hyperlipidemia, unspecified: Secondary | ICD-10-CM | POA: Insufficient documentation

## 2016-08-28 DIAGNOSIS — I10 Essential (primary) hypertension: Secondary | ICD-10-CM | POA: Insufficient documentation

## 2016-08-28 DIAGNOSIS — R0789 Other chest pain: Secondary | ICD-10-CM | POA: Insufficient documentation

## 2016-08-28 DIAGNOSIS — E784 Other hyperlipidemia: Secondary | ICD-10-CM | POA: Diagnosis not present

## 2016-08-28 DIAGNOSIS — E7849 Other hyperlipidemia: Secondary | ICD-10-CM

## 2016-08-28 DIAGNOSIS — Z951 Presence of aortocoronary bypass graft: Secondary | ICD-10-CM

## 2016-08-28 HISTORY — PX: CARDIOVASCULAR STRESS TEST: SHX262

## 2016-08-28 LAB — MYOCARDIAL PERFUSION IMAGING
CHL CUP NUCLEAR SSS: 5
CSEPPHR: 81 {beats}/min
LVDIAVOL: 91 mL (ref 62–150)
LVSYSVOL: 38 mL
RATE: 0.36
Rest HR: 52 {beats}/min
SDS: 2
SRS: 3
TID: 1.12

## 2016-08-28 MED ORDER — TECHNETIUM TC 99M TETROFOSMIN IV KIT
10.7000 | PACK | Freq: Once | INTRAVENOUS | Status: AC | PRN
  Administered 2016-08-28: 10.7 via INTRAVENOUS
  Filled 2016-08-28: qty 11

## 2016-08-28 MED ORDER — REGADENOSON 0.4 MG/5ML IV SOLN
0.4000 mg | Freq: Once | INTRAVENOUS | Status: AC
  Administered 2016-08-28: 0.4 mg via INTRAVENOUS

## 2016-08-28 MED ORDER — TECHNETIUM TC 99M TETROFOSMIN IV KIT
31.7000 | PACK | Freq: Once | INTRAVENOUS | Status: AC | PRN
  Administered 2016-08-28: 31.7 via INTRAVENOUS
  Filled 2016-08-28: qty 32

## 2016-09-05 DIAGNOSIS — E1151 Type 2 diabetes mellitus with diabetic peripheral angiopathy without gangrene: Secondary | ICD-10-CM | POA: Diagnosis not present

## 2016-09-05 DIAGNOSIS — L602 Onychogryphosis: Secondary | ICD-10-CM | POA: Diagnosis not present

## 2016-10-01 ENCOUNTER — Other Ambulatory Visit: Payer: Self-pay | Admitting: Family Medicine

## 2016-10-02 ENCOUNTER — Ambulatory Visit (INDEPENDENT_AMBULATORY_CARE_PROVIDER_SITE_OTHER): Payer: Medicare Other | Admitting: Family Medicine

## 2016-10-02 ENCOUNTER — Ambulatory Visit (INDEPENDENT_AMBULATORY_CARE_PROVIDER_SITE_OTHER): Payer: Medicare Other

## 2016-10-02 ENCOUNTER — Encounter: Payer: Self-pay | Admitting: Family Medicine

## 2016-10-02 ENCOUNTER — Other Ambulatory Visit: Payer: Self-pay | Admitting: Family Medicine

## 2016-10-02 VITALS — BP 116/76 | HR 77 | Temp 97.8°F | Resp 16 | Wt 163.0 lb

## 2016-10-02 DIAGNOSIS — S60011A Contusion of right thumb without damage to nail, initial encounter: Secondary | ICD-10-CM

## 2016-10-02 DIAGNOSIS — M79644 Pain in right finger(s): Secondary | ICD-10-CM

## 2016-10-02 DIAGNOSIS — W230XXA Caught, crushed, jammed, or pinched between moving objects, initial encounter: Secondary | ICD-10-CM | POA: Diagnosis not present

## 2016-10-02 DIAGNOSIS — S62521A Displaced fracture of distal phalanx of right thumb, initial encounter for closed fracture: Secondary | ICD-10-CM

## 2016-10-02 DIAGNOSIS — S62524A Nondisplaced fracture of distal phalanx of right thumb, initial encounter for closed fracture: Secondary | ICD-10-CM

## 2016-10-02 NOTE — Progress Notes (Signed)
OFFICE VISIT  10/02/2016   CC:  Chief Complaint  Patient presents with  . Hand Pain    CLOSED DOOR ON FINGER   HPI:    Patient is a 81 y.o. Caucasian male who presents for right thumb pain. Yesterday he accidentally shut car door onto R thumb.  The pain is less intense today.  He iced it last night. He can move it ok.  Past Medical History:  Diagnosis Date  . BPH with obstruction/lower urinary tract symptoms 10/27/2015   Dr. Karsten Ro  . Cataract   . Coronary artery disease   . Dementia   . Diabetes mellitus without complication ([place])   . Diverticulosis of colon    severe, entire colon  . Erectile dysfunction due to arterial insufficiency   . Fatigue   . Gout    always 2nd toe L foot (uric acid 6.20 Dec 2014 per old records)  . History of adenomatous polyp of colon 2002  . History of stomach ulcers   . Hyperlipidemia   . Hypertension   . Hypogonadism male   . Past use of tobacco    quit 1984  . Rectal bleeding   . Rosacea   . S/P coronary artery bypass graft x 3   . Urine incontinence    Dr. Karsten Ro    Past Surgical History:  Procedure Laterality Date  . CARDIOVASCULAR STRESS TEST     Low risk myoview, normal EF, no ischemia.  Marland Kitchen CATARACT EXTRACTION, BILATERAL    . COLONOSCOPY  2002, 2006, 02/25/2009   Polyp x 1 2002, none 2006 or 2010  . CORONARY ARTERY BYPASS GRAFT  06/2009   descending,saphenous vein graft to first obtuse marginal, sequential saphenous vein graft to posterior descending and posterolateral  . Endoscopic vein harvest right thigh    . PTCA    . TONSILLECTOMY    . UMBILICAL HERNIA REPAIR     2009  . VASECTOMY      Outpatient Medications Prior to Visit  Medication Sig Dispense Refill  . allopurinol (ZYLOPRIM) 100 MG tablet TAKE ONE TABLET BY MOUTH ON MONDAY, WEDNESDAY, AND FRIDAY 30 tablet 3  . ALPRAZolam (NIRAVAM) 0.25 MG dissolvable tablet Take 0.25 mg by mouth at bedtime as needed (for sleep).     Marland Kitchen aspirin 325 MG tablet Take 325 mg by  mouth daily.      Marland Kitchen donepezil (ARICEPT ODT) 10 MG disintegrating tablet Take 1 tablet (10 mg total) by mouth at bedtime. 30 tablet 11  . finasteride (PROSCAR) 5 MG tablet TAKE ONE TABLET BY MOUTH DAILY 90 tablet 0  . indomethacin (INDOCIN) 25 MG capsule Take 25 mg by mouth 2 (two) times daily as needed for mild pain or moderate pain (gout).    Marland Kitchen lisinopril (PRINIVIL,ZESTRIL) 5 MG tablet TAKE ONE TABLET BY MOUTH EVERY DAY 90 tablet 3  . metFORMIN (GLUCOPHAGE-XR) 500 MG 24 hr tablet Take 1 tablet (500 mg total) by mouth daily with breakfast. 180 tablet 6  . metoprolol tartrate (LOPRESSOR) 25 MG tablet Take 25 mg by mouth daily.     . metroNIDAZOLE (METROCREAM) 0.75 % cream Apply topically 2 (two) times daily. 45 g 11  . MULTIPLE VITAMIN PO Take 1 tablet by mouth daily.     . nitroGLYCERIN (NITROSTAT) 0.4 MG SL tablet Place 1 tablet (0.4 mg total) under the tongue every 5 (five) minutes as needed for chest pain. 25 tablet 3  . oxybutynin (DITROPAN-XL) 10 MG 24 hr tablet TAKE ONE TABLET  BY MOUTH DAILY 90 tablet 0  . rosuvastatin (CRESTOR) 10 MG tablet TAKE ONE TABLET BY MOUTH DAILY 90 tablet 1  . sodium fluoride (DENTA 5000 PLUS) 1.1 % CREA dental cream Place 1 application onto teeth every evening.    . tamsulosin (FLOMAX) 0.4 MG CAPS capsule TAKE ONE CAPSULE BY MOUTH EVERY DAY 90 capsule 0  . Trospium Chloride 60 MG CP24 Take 1 capsule by mouth daily.  3   No facility-administered medications prior to visit.     Allergies  Allergen Reactions  . Sulfonamide Derivatives     Unknown, per pt and "cant stand it"    ROS As per HPI  PE: Blood pressure 116/76, pulse 77, temperature 97.8 F (36.6 C), temperature source Temporal, resp. rate 16, weight 163 lb (73.9 kg), SpO2 99 %. Gen: Alert, well appearing.  Patient is oriented to person, place, time, and situation. AFFECT: pleasant, lucid thought and speech. Right thumb with mild swelling and ecchymoses distally, with mild to mod TTP over IP  joint.  Small abrasion just proximal to nail.  Minimal bruising under nail is noted.  He has pretty normal ROM of R thumb with mild pain with flexion/extension at IP joint.  LABS:  none  IMPRESSION AND PLAN:  Right thumb contusion, need to r/o fx. X-ray ordered today. Pt to continue ice. We discussed wrapping it or applying a splint but he said he doesn't think he needs this.  An After Visit Summary was printed and given to the patient.  FOLLOW UP: Return if symptoms worsen or fail to improve.  Signed:  Crissie Sickles, MD           10/02/2016

## 2016-10-02 NOTE — Progress Notes (Signed)
Pre visit review using our clinic review tool, if applicable. No additional management support is needed unless otherwise documented below in the visit note. 

## 2016-10-05 ENCOUNTER — Other Ambulatory Visit: Payer: Self-pay | Admitting: Family Medicine

## 2016-10-05 DIAGNOSIS — S62524A Nondisplaced fracture of distal phalanx of right thumb, initial encounter for closed fracture: Secondary | ICD-10-CM | POA: Diagnosis not present

## 2016-10-08 NOTE — Telephone Encounter (Signed)
RF request for finasteride Last written: 06/25/16 #90 w/ 0RF  RF request for tamsulosin Last written: 06/25/16 #90 w/ 0RF  RF request for oxybutnin Last written: 06/25/16 #90 w/ 0RF

## 2016-11-01 ENCOUNTER — Encounter: Payer: Self-pay | Admitting: Family Medicine

## 2016-11-01 ENCOUNTER — Ambulatory Visit (INDEPENDENT_AMBULATORY_CARE_PROVIDER_SITE_OTHER): Payer: Medicare Other | Admitting: Family Medicine

## 2016-11-01 ENCOUNTER — Encounter: Payer: Self-pay | Admitting: *Deleted

## 2016-11-01 VITALS — BP 106/62 | HR 56 | Temp 97.3°F | Resp 16 | Ht 66.0 in | Wt 164.5 lb

## 2016-11-01 DIAGNOSIS — F0390 Unspecified dementia without behavioral disturbance: Secondary | ICD-10-CM

## 2016-11-01 DIAGNOSIS — I10 Essential (primary) hypertension: Secondary | ICD-10-CM | POA: Diagnosis not present

## 2016-11-01 DIAGNOSIS — E119 Type 2 diabetes mellitus without complications: Secondary | ICD-10-CM | POA: Diagnosis not present

## 2016-11-01 LAB — BASIC METABOLIC PANEL
BUN: 16 mg/dL (ref 6–23)
CHLORIDE: 103 meq/L (ref 96–112)
CO2: 34 meq/L — AB (ref 19–32)
CREATININE: 1.02 mg/dL (ref 0.40–1.50)
Calcium: 9.1 mg/dL (ref 8.4–10.5)
GFR: 74.71 mL/min (ref >60.00)
Glucose, Bld: 111 mg/dL — ABNORMAL HIGH (ref 70–99)
Potassium: 4.8 mEq/L (ref 3.5–5.1)
Sodium: 142 mEq/L (ref 135–145)

## 2016-11-01 LAB — HEMOGLOBIN A1C: Hgb A1c MFr Bld: 5.9 % (ref 4.6–6.5)

## 2016-11-01 MED ORDER — BENZONATATE 100 MG PO CAPS: 100.0000 mg | ORAL_CAPSULE | Freq: Two times a day (BID) | ORAL | 0 refills | Status: DC | PRN

## 2016-11-01 NOTE — Progress Notes (Addendum)
OFFICE VISIT  11/01/2016   CC:  Chief Complaint  Patient presents with  . Follow-up    RCI, pt is not fasting.    HPI:    Patient is a 81 y.o. Caucasian male who presents for 4 mo f/u DM 2, HTN, and dementia. Last visit we increased his aricept to 40m qd.  He feels like this has helped a little.  Still some word finding problems, no problems getting lost driving, although he does use his wife some for navigation.  DM: fasting around 100: checks sugar about 2 X/week.  Compliant with meds/diet. Feet: no burning, tingling, or numbness.  HTN: home monitoring 120s/60s consistently.  Compliant with meds.  No new complaints.  Denies CP, SOB, HAs, palpitations, dizziness, or LE swelling.  Past Medical History:  Diagnosis Date  . BPH with obstruction/lower urinary tract symptoms 10/27/2015   Dr. OKarsten Ro . Cataract   . Coronary artery disease   . Dementia   . Diabetes mellitus without complication ([place]   . Diverticulosis of colon    severe, entire colon  . Erectile dysfunction due to arterial insufficiency   . Fatigue   . Gout    always 2nd toe L foot (uric acid 6.20 Dec 2014 per old records)  . History of adenomatous polyp of colon 2002  . History of stomach ulcers   . Hyperlipidemia   . Hypertension   . Hypogonadism male   . Past use of tobacco    quit 1984  . Rectal bleeding   . Rosacea   . S/P coronary artery bypass graft x 3   . Urine incontinence    Dr. OKarsten Ro   Past Surgical History:  Procedure Laterality Date  . CARDIOVASCULAR STRESS TEST     Low risk myoview, normal EF, no ischemia.  .Marland KitchenCATARACT EXTRACTION, BILATERAL    . COLONOSCOPY  2002, 2006, 02/25/2009   Polyp x 1 2002, none 2006 or 2010  . CORONARY ARTERY BYPASS GRAFT  06/2009   descending,saphenous vein graft to first obtuse marginal, sequential saphenous vein graft to posterior descending and posterolateral  . Endoscopic vein harvest right thigh    . PTCA    . TONSILLECTOMY    . UMBILICAL  HERNIA REPAIR     2009  . VASECTOMY      Outpatient Medications Prior to Visit  Medication Sig Dispense Refill  . allopurinol (ZYLOPRIM) 100 MG tablet TAKE ONE TABLET BY MOUTH ON MONDAY, WEDNESDAY, AND FRIDAY 30 tablet 3  . aspirin 325 MG tablet Take 325 mg by mouth daily.      .Marland Kitchendonepezil (ARICEPT ODT) 10 MG disintegrating tablet Take 1 tablet (10 mg total) by mouth at bedtime. 30 tablet 11  . finasteride (PROSCAR) 5 MG tablet TAKE ONE TABLET BY MOUTH DAILY 90 tablet 1  . indomethacin (INDOCIN) 25 MG capsule Take 25 mg by mouth 2 (two) times daily as needed for mild pain or moderate pain (gout).    .Marland Kitchenlisinopril (PRINIVIL,ZESTRIL) 5 MG tablet TAKE ONE TABLET BY MOUTH EVERY DAY 90 tablet 3  . metFORMIN (GLUCOPHAGE-XR) 500 MG 24 hr tablet Take 1 tablet (500 mg total) by mouth daily with breakfast. (Patient taking differently: Take 500 mg by mouth 2 (two) times daily. ) 180 tablet 6  . metoprolol tartrate (LOPRESSOR) 25 MG tablet Take 25 mg by mouth daily.     . MULTIPLE VITAMIN PO Take 1 tablet by mouth daily.     . nitroGLYCERIN (NITROSTAT) 0.4  MG SL tablet Place 1 tablet (0.4 mg total) under the tongue every 5 (five) minutes as needed for chest pain. 25 tablet 3  . oxybutynin (DITROPAN-XL) 10 MG 24 hr tablet TAKE ONE TABLET BY MOUTH DAILY 90 tablet 1  . rosuvastatin (CRESTOR) 10 MG tablet TAKE ONE TABLET BY MOUTH DAILY 90 tablet 1  . sodium fluoride (DENTA 5000 PLUS) 1.1 % CREA dental cream Place 1 application onto teeth every evening.    . tamsulosin (FLOMAX) 0.4 MG CAPS capsule TAKE ONE CAPSULE BY MOUTH DAILY 90 capsule 1  . Trospium Chloride 60 MG CP24 Take 1 capsule by mouth daily.  3  . ALPRAZolam (NIRAVAM) 0.25 MG dissolvable tablet Take 0.25 mg by mouth at bedtime as needed (for sleep).     . metroNIDAZOLE (METROCREAM) 0.75 % cream Apply topically 2 (two) times daily. (Patient not taking: Reported on 11/01/2016) 45 g 11   No facility-administered medications prior to visit.      Allergies  Allergen Reactions  . Sulfonamide Derivatives     Unknown, per pt and "cant stand it"    ROS As per HPI  PE: Blood pressure 106/62, pulse (!) 56, temperature 97.3 F (36.3 C), temperature source Oral, resp. rate 16, height 5' 6"  (1.676 m), weight 164 lb 8 oz (74.6 kg), SpO2 97 %. Gen: Alert, well appearing.  Patient is oriented to person, place, time, and situation. AFFECT: pleasant, lucid thought and speech. Foot exam - bilateral normal; no swelling, tenderness or skin or vascular lesions. Color and temperature is normal. Sensation is intact. Peripheral pulses are palpable. Toenails are normal.   LABS:  Lab Results  Component Value Date   TSH 4.19 04/27/2015   Lab Results  Component Value Date   WBC 5.8 04/27/2015   HGB 13.3 04/27/2015   HCT 39.4 04/27/2015   MCV 99.8 04/27/2015   PLT 181.0 04/27/2015   Lab Results  Component Value Date   CREATININE 1.01 07/04/2016   BUN 15 07/04/2016   NA 140 07/04/2016   K 4.9 07/04/2016   CL 105 07/04/2016   CO2 24 07/04/2016   Lab Results  Component Value Date   ALT 8 07/04/2016   AST 13 07/04/2016   ALKPHOS 70 07/04/2016   BILITOT 0.5 07/04/2016   Lab Results  Component Value Date   CHOL 109 07/04/2016   Lab Results  Component Value Date   HDL 45.90 07/04/2016   Lab Results  Component Value Date   LDLCALC 50 07/04/2016   Lab Results  Component Value Date   TRIG 68.0 07/04/2016   Lab Results  Component Value Date   CHOLHDL 2 07/04/2016   Lab Results  Component Value Date   HGBA1C 6.0 07/04/2016   IMPRESSION AND PLAN:  1) DM 2, good control. Feet exam normal today. Hba1c today.  2) HTN: The current medical regimen is effective;  continue present plan and medications. Lytes/cr today.  3) Dementia: improved with increase of aricept from 66m to 155mqd.  Continue this dosing.  An After Visit Summary was printed and given to the patient.  FOLLOW UP: Return for routine chronic illness  f/u.  Also, appt for AWV with KiMaudie Mercuryn 2 mo..  Signed:  PhCrissie SicklesMD           11/01/2016  ADDENDUM: at end of visit today after pt's labs were drawn, he stated he had been having a cold for the last couple weeks and asked if I could suggest any  med.  I agreed to call in [facility] for pt.

## 2016-11-01 NOTE — Addendum Note (Signed)
Addended by: [name] on: 11/01/2016 11:17 AM   Modules accepted: Orders

## 2016-11-01 NOTE — Progress Notes (Signed)
Pre visit review using our clinic review tool, if applicable. No additional management support is needed unless otherwise documented below in the visit note. 

## 2016-11-19 DIAGNOSIS — H02423 Myogenic ptosis of bilateral eyelids: Secondary | ICD-10-CM

## 2016-11-19 HISTORY — DX: Myogenic ptosis of bilateral eyelids: H02.423

## 2016-11-28 DIAGNOSIS — M21612 Bunion of left foot: Secondary | ICD-10-CM | POA: Diagnosis not present

## 2016-11-28 DIAGNOSIS — L602 Onychogryphosis: Secondary | ICD-10-CM | POA: Diagnosis not present

## 2016-11-28 DIAGNOSIS — M2042 Other hammer toe(s) (acquired), left foot: Secondary | ICD-10-CM | POA: Diagnosis not present

## 2016-11-28 DIAGNOSIS — E1351 Other specified diabetes mellitus with diabetic peripheral angiopathy without gangrene: Secondary | ICD-10-CM | POA: Diagnosis not present

## 2016-12-12 ENCOUNTER — Other Ambulatory Visit: Payer: Self-pay | Admitting: Family Medicine

## 2017-01-02 ENCOUNTER — Other Ambulatory Visit: Payer: Self-pay | Admitting: Family Medicine

## 2017-01-07 ENCOUNTER — Ambulatory Visit: Payer: Medicare Other | Admitting: Family Medicine

## 2017-01-07 ENCOUNTER — Ambulatory Visit: Payer: Medicare Other

## 2017-01-07 NOTE — Progress Notes (Deleted)
OFFICE VISIT  01/07/2017   CC: No chief complaint on file.  HPI:    Patient is a 82 y.o. Caucasian male who presents for 2 mo f/u DM 2, HTN, HLD.  Past Medical History:  Diagnosis Date  . BPH with obstruction/lower urinary tract symptoms 10/27/2015   Dr. Karsten Ro  . Cataract   . Coronary artery disease   . Dementia   . Diabetes mellitus without complication ([place])   . Diverticulosis of colon    severe, entire colon  . Erectile dysfunction due to arterial insufficiency   . Fatigue   . Gout    always 2nd toe L foot (uric acid 6.20 Dec 2014 per old records)  . History of adenomatous polyp of colon 2002  . History of stomach ulcers   . Hyperlipidemia   . Hypertension   . Hypogonadism male   . Past use of tobacco    quit 1984  . Rectal bleeding   . Rosacea   . S/P coronary artery bypass graft x 3   . Urine incontinence    Dr. Karsten Ro    Past Surgical History:  Procedure Laterality Date  . CARDIOVASCULAR STRESS TEST     Low risk myoview, normal EF, no ischemia.  Marland Kitchen CATARACT EXTRACTION, BILATERAL    . COLONOSCOPY  2002, 2006, 02/25/2009   Polyp x 1 2002, none 2006 or 2010  . CORONARY ARTERY BYPASS GRAFT  06/2009   descending,saphenous vein graft to first obtuse marginal, sequential saphenous vein graft to posterior descending and posterolateral  . Endoscopic vein harvest right thigh    . PTCA    . TONSILLECTOMY    . UMBILICAL HERNIA REPAIR     2009  . VASECTOMY      Outpatient Medications Prior to Visit  Medication Sig Dispense Refill  . allopurinol (ZYLOPRIM) 100 MG tablet TAKE ONE TABLET BY MOUTH ON MONDAY, WEDNESDAY, AND FRIDAY 30 tablet 3  . ALPRAZolam (NIRAVAM) 0.25 MG dissolvable tablet Take 0.25 mg by mouth at bedtime as needed for anxiety.    Marland Kitchen aspirin 325 MG tablet Take 325 mg by mouth daily.      . benzonatate (TESSALON) 100 MG capsule Take 1 capsule (100 mg total) by mouth 2 (two) times daily as needed for cough. 20 capsule 0  . donepezil (ARICEPT ODT) 10 MG  disintegrating tablet Take 1 tablet (10 mg total) by mouth at bedtime. 30 tablet 11  . finasteride (PROSCAR) 5 MG tablet TAKE ONE TABLET BY MOUTH DAILY 90 tablet 1  . indomethacin (INDOCIN) 25 MG capsule Take 25 mg by mouth 2 (two) times daily as needed for mild pain or moderate pain (gout).    Marland Kitchen lisinopril (PRINIVIL,ZESTRIL) 5 MG tablet TAKE ONE TABLET BY MOUTH DAILY 90 tablet 1  . metFORMIN (GLUCOPHAGE-XR) 500 MG 24 hr tablet Take 1 tablet (500 mg total) by mouth daily with breakfast. (Patient taking differently: Take 500 mg by mouth 2 (two) times daily. ) 180 tablet 6  . metoprolol tartrate (LOPRESSOR) 25 MG tablet TAKE ONE TABLET BY MOUTH TWICE DAILY 180 tablet 1  . metroNIDAZOLE (METROCREAM) 0.75 % cream Apply 1 application topically 2 (two) times daily.    . MULTIPLE VITAMIN PO Take 1 tablet by mouth daily.     . nitroGLYCERIN (NITROSTAT) 0.4 MG SL tablet Place 1 tablet (0.4 mg total) under the tongue every 5 (five) minutes as needed for chest pain. 25 tablet 3  . oxybutynin (DITROPAN-XL) 10 MG 24 hr tablet TAKE  ONE TABLET BY MOUTH DAILY 90 tablet 1  . rosuvastatin (CRESTOR) 10 MG tablet TAKE ONE TABLET BY MOUTH DAILY 90 tablet 1  . sodium fluoride (DENTA 5000 PLUS) 1.1 % CREA dental cream Place 1 application onto teeth every evening.    . tamsulosin (FLOMAX) 0.4 MG CAPS capsule TAKE ONE CAPSULE BY MOUTH DAILY 90 capsule 1  . Trospium Chloride 60 MG CP24 Take 1 capsule by mouth daily.  3   No facility-administered medications prior to visit.     Allergies  Allergen Reactions  . Sulfonamide Derivatives     Unknown, per pt and "cant stand it"    ROS As per HPI  PE: There were no vitals taken for this visit. ***  LABS:  Lab Results  Component Value Date   TSH 4.19 04/27/2015   Lab Results  Component Value Date   WBC 5.8 04/27/2015   HGB 13.3 04/27/2015   HCT 39.4 04/27/2015   MCV 99.8 04/27/2015   PLT 181.0 04/27/2015   Lab Results  Component Value Date   CREATININE  1.02 11/01/2016   BUN 16 11/01/2016   NA 142 11/01/2016   K 4.8 11/01/2016   CL 103 11/01/2016   CO2 34 (H) 11/01/2016   Lab Results  Component Value Date   ALT 8 07/04/2016   AST 13 07/04/2016   ALKPHOS 70 07/04/2016   BILITOT 0.5 07/04/2016   Lab Results  Component Value Date   CHOL 109 07/04/2016   Lab Results  Component Value Date   HDL 45.90 07/04/2016   Lab Results  Component Value Date   LDLCALC 50 07/04/2016   Lab Results  Component Value Date   TRIG 68.0 07/04/2016   Lab Results  Component Value Date   CHOLHDL 2 07/04/2016   Lab Results  Component Value Date   HGBA1C 5.9 11/01/2016   IMPRESSION AND PLAN:  No problem-specific Assessment & Plan notes found for this encounter.   FOLLOW UP: No Follow-up on file.

## 2017-01-17 DIAGNOSIS — A4159 Other Gram-negative sepsis: Secondary | ICD-10-CM

## 2017-01-17 DIAGNOSIS — I77811 Abdominal aortic ectasia: Secondary | ICD-10-CM

## 2017-01-17 DIAGNOSIS — D539 Nutritional anemia, unspecified: Secondary | ICD-10-CM

## 2017-01-17 HISTORY — DX: Nutritional anemia, unspecified: D53.9

## 2017-01-17 HISTORY — DX: Abdominal aortic ectasia: I77.811

## 2017-01-17 HISTORY — DX: Other gram-negative sepsis: A41.59

## 2017-01-21 ENCOUNTER — Other Ambulatory Visit: Payer: Self-pay | Admitting: Family Medicine

## 2017-01-23 ENCOUNTER — Other Ambulatory Visit: Payer: Self-pay | Admitting: Dermatology

## 2017-01-23 DIAGNOSIS — D0439 Carcinoma in situ of skin of other parts of face: Secondary | ICD-10-CM | POA: Diagnosis not present

## 2017-01-23 DIAGNOSIS — D229 Melanocytic nevi, unspecified: Secondary | ICD-10-CM | POA: Diagnosis not present

## 2017-01-23 DIAGNOSIS — L821 Other seborrheic keratosis: Secondary | ICD-10-CM | POA: Diagnosis not present

## 2017-01-23 DIAGNOSIS — L570 Actinic keratosis: Secondary | ICD-10-CM | POA: Diagnosis not present

## 2017-01-23 DIAGNOSIS — C44329 Squamous cell carcinoma of skin of other parts of face: Secondary | ICD-10-CM | POA: Diagnosis not present

## 2017-01-30 ENCOUNTER — Encounter (HOSPITAL_BASED_OUTPATIENT_CLINIC_OR_DEPARTMENT_OTHER): Payer: Self-pay | Admitting: *Deleted

## 2017-01-30 ENCOUNTER — Emergency Department (HOSPITAL_BASED_OUTPATIENT_CLINIC_OR_DEPARTMENT_OTHER): Payer: Medicare Other

## 2017-01-30 ENCOUNTER — Inpatient Hospital Stay (HOSPITAL_BASED_OUTPATIENT_CLINIC_OR_DEPARTMENT_OTHER)
Admission: EM | Admit: 2017-01-30 | Discharge: 2017-02-04 | DRG: 872 | Disposition: A | Discharge status: Home / Self Care | Payer: Medicare Other | Attending: Internal Medicine | Admitting: Internal Medicine

## 2017-01-30 DIAGNOSIS — M109 Gout, unspecified: Secondary | ICD-10-CM | POA: Diagnosis present

## 2017-01-30 DIAGNOSIS — R0902 Hypoxemia: Secondary | ICD-10-CM | POA: Diagnosis not present

## 2017-01-30 DIAGNOSIS — Z8711 Personal history of peptic ulcer disease: Secondary | ICD-10-CM

## 2017-01-30 DIAGNOSIS — I959 Hypotension, unspecified: Secondary | ICD-10-CM | POA: Diagnosis present

## 2017-01-30 DIAGNOSIS — Z951 Presence of aortocoronary bypass graft: Secondary | ICD-10-CM

## 2017-01-30 DIAGNOSIS — M5489 Other dorsalgia: Secondary | ICD-10-CM | POA: Diagnosis not present

## 2017-01-30 DIAGNOSIS — R509 Fever, unspecified: Secondary | ICD-10-CM

## 2017-01-30 DIAGNOSIS — R740 Nonspecific elevation of levels of transaminase and lactic acid dehydrogenase [LDH]: Secondary | ICD-10-CM | POA: Diagnosis present

## 2017-01-30 DIAGNOSIS — Z79899 Other long term (current) drug therapy: Secondary | ICD-10-CM

## 2017-01-30 DIAGNOSIS — R6510 Systemic inflammatory response syndrome (SIRS) of non-infectious origin without acute organ dysfunction: Secondary | ICD-10-CM

## 2017-01-30 DIAGNOSIS — A4159 Other Gram-negative sepsis: Principal | ICD-10-CM | POA: Diagnosis present

## 2017-01-30 DIAGNOSIS — K5720 Diverticulitis of large intestine with perforation and abscess without bleeding: Secondary | ICD-10-CM

## 2017-01-30 DIAGNOSIS — Z7982 Long term (current) use of aspirin: Secondary | ICD-10-CM

## 2017-01-30 DIAGNOSIS — R413 Other amnesia: Secondary | ICD-10-CM | POA: Diagnosis present

## 2017-01-30 DIAGNOSIS — K810 Acute cholecystitis: Secondary | ICD-10-CM

## 2017-01-30 DIAGNOSIS — E872 Acidosis: Secondary | ICD-10-CM | POA: Diagnosis present

## 2017-01-30 DIAGNOSIS — K5732 Diverticulitis of large intestine without perforation or abscess without bleeding: Secondary | ICD-10-CM | POA: Diagnosis present

## 2017-01-30 DIAGNOSIS — A419 Sepsis, unspecified organism: Secondary | ICD-10-CM | POA: Diagnosis present

## 2017-01-30 DIAGNOSIS — I10 Essential (primary) hypertension: Secondary | ICD-10-CM | POA: Diagnosis present

## 2017-01-30 DIAGNOSIS — Z86010 Personal history of colonic polyps: Secondary | ICD-10-CM

## 2017-01-30 DIAGNOSIS — Z87891 Personal history of nicotine dependence: Secondary | ICD-10-CM

## 2017-01-30 DIAGNOSIS — K819 Cholecystitis, unspecified: Secondary | ICD-10-CM

## 2017-01-30 DIAGNOSIS — I2510 Atherosclerotic heart disease of native coronary artery without angina pectoris: Secondary | ICD-10-CM | POA: Diagnosis present

## 2017-01-30 DIAGNOSIS — E119 Type 2 diabetes mellitus without complications: Secondary | ICD-10-CM | POA: Diagnosis present

## 2017-01-30 DIAGNOSIS — N289 Disorder of kidney and ureter, unspecified: Secondary | ICD-10-CM | POA: Diagnosis present

## 2017-01-30 DIAGNOSIS — N400 Enlarged prostate without lower urinary tract symptoms: Secondary | ICD-10-CM | POA: Diagnosis present

## 2017-01-30 DIAGNOSIS — F0390 Unspecified dementia without behavioral disturbance: Secondary | ICD-10-CM | POA: Diagnosis present

## 2017-01-30 DIAGNOSIS — D696 Thrombocytopenia, unspecified: Secondary | ICD-10-CM | POA: Diagnosis not present

## 2017-01-30 DIAGNOSIS — Z7984 Long term (current) use of oral hypoglycemic drugs: Secondary | ICD-10-CM

## 2017-01-30 DIAGNOSIS — A414 Sepsis due to anaerobes: Secondary | ICD-10-CM

## 2017-01-30 DIAGNOSIS — R945 Abnormal results of liver function studies: Secondary | ICD-10-CM | POA: Diagnosis present

## 2017-01-30 DIAGNOSIS — B961 Klebsiella pneumoniae [K. pneumoniae] as the cause of diseases classified elsewhere: Secondary | ICD-10-CM | POA: Diagnosis present

## 2017-01-30 DIAGNOSIS — R7989 Other specified abnormal findings of blood chemistry: Secondary | ICD-10-CM

## 2017-01-30 DIAGNOSIS — K5730 Diverticulosis of large intestine without perforation or abscess without bleeding: Secondary | ICD-10-CM | POA: Diagnosis present

## 2017-01-30 LAB — CBC WITH DIFFERENTIAL/PLATELET
BASOS PCT: 0 %
Basophils Absolute: 0 10*3/uL (ref 0.0–0.1)
EOS ABS: 0 10*3/uL (ref 0.0–0.7)
EOS PCT: 0 %
HCT: 38.5 % — ABNORMAL LOW (ref 39.0–52.0)
Hemoglobin: 13 g/dL (ref 13.0–17.0)
LYMPHS ABS: 0.4 10*3/uL — AB (ref 0.7–4.0)
Lymphocytes Relative: 3 %
MCH: 34.3 pg — ABNORMAL HIGH (ref 26.0–34.0)
MCHC: 33.8 g/dL (ref 30.0–36.0)
MCV: 101.6 fL — ABNORMAL HIGH (ref 78.0–100.0)
MONOS PCT: 2 %
Monocytes Absolute: 0.2 10*3/uL (ref 0.1–1.0)
Neutro Abs: 9.6 10*3/uL — ABNORMAL HIGH (ref 1.7–7.7)
Neutrophils Relative %: 95 %
PLATELETS: 187 10*3/uL (ref 150–400)
RBC: 3.79 MIL/uL — ABNORMAL LOW (ref 4.22–5.81)
RDW: 14.3 % (ref 11.5–15.5)
WBC: 10.2 10*3/uL (ref 4.0–10.5)

## 2017-01-30 LAB — I-STAT CG4 LACTIC ACID, ED
LACTIC ACID, VENOUS: 5.8 mmol/L — AB (ref 0.5–1.9)
Lactic Acid, Venous: 5.13 mmol/L (ref 0.5–1.9)

## 2017-01-30 MED ORDER — ACETAMINOPHEN 325 MG PO TABS
650.0000 mg | ORAL_TABLET | Freq: Once | ORAL | Status: AC
Start: 2017-01-30 — End: 2017-01-30
  Administered 2017-01-30: 650 mg via ORAL
  Filled 2017-01-30: qty 2

## 2017-01-30 MED ORDER — PIPERACILLIN-TAZOBACTAM 3.375 G IVPB 30 MIN
3.3750 g | Freq: Once | INTRAVENOUS | Status: AC
  Administered 2017-01-31: 3.375 g via INTRAVENOUS
  Filled 2017-01-30 (×2): qty 50

## 2017-01-30 MED ORDER — VANCOMYCIN HCL IN DEXTROSE 1-5 GM/200ML-% IV SOLN
1000.0000 mg | Freq: Once | INTRAVENOUS | Status: AC
  Administered 2017-01-31: 1000 mg via INTRAVENOUS
  Filled 2017-01-30: qty 200

## 2017-01-30 MED ORDER — ONDANSETRON HCL 4 MG/2ML IJ SOLN
4.0000 mg | Freq: Once | INTRAMUSCULAR | Status: AC
  Administered 2017-01-30: 4 mg via INTRAVENOUS
  Filled 2017-01-30: qty 2

## 2017-01-30 MED ORDER — SODIUM CHLORIDE 0.9 % IV BOLUS (SEPSIS)
2250.0000 mL | Freq: Once | INTRAVENOUS | Status: AC
  Administered 2017-01-30: 2250 mL via INTRAVENOUS

## 2017-01-30 NOTE — ED Provider Notes (Signed)
Ricardo Washington Note: Ricardo Spurling, MD, FACEP  CSN: [id number] MRN: [id number] ARRIVAL: 01/30/17 at 2212 ROOM: 4E04C/4E04C-01  By signing my name below, I, Ricardo Washington, attest that this documentation has been prepared under the direction and in the presence of Ricardo Rosser, MD. Electronically Signed: Gwenlyn Washington, ED Scribe. 01/30/17. 11:07 PM.   CHIEF COMPLAINT  Fever   HISTORY OF PRESENT ILLNESS  Ricardo Washington is a 82 y.o. male who presents to the Emergency Department complaining of shaking and chills for a few hours PTA. Pt reports associated nausea and vomiting. He has had about 5-6 episodes of vomiting from 4-6 PM. Pt has been unable to tolerate PO intake. Pt's cbg was measured at 185 at home and 210 in ED. He denies abdominal pain, diarrhea and fever. Patient's oral temperature in the ED was normal but he was noted to have rectal temperature of 101.2.  Past Medical History:  Diagnosis Date  . BPH with obstruction/lower urinary tract symptoms 10/27/2015   Dr. Karsten Ro  . Cataract   . Coronary artery disease   . Dementia   . Diabetes mellitus without complication ([place])   . Diverticulosis of colon    severe, entire colon  . Erectile dysfunction due to arterial insufficiency   . Fatigue   . Gout    always 2nd toe L foot (uric acid 6.20 Dec 2014 per old records)  . History of adenomatous polyp of colon 2002  . History of stomach ulcers   . Hyperlipidemia   . Hypertension   . Hypogonadism male   . Past use of tobacco    quit 1984  . Rectal bleeding   . Rosacea   . S/P coronary artery bypass graft x 3   . Urine incontinence    Dr. Karsten Ro    Past Surgical History:  Procedure Laterality Date  . CARDIOVASCULAR STRESS TEST     Low risk myoview, normal EF, no ischemia.  Marland Kitchen CATARACT EXTRACTION, BILATERAL    . COLONOSCOPY  2002, 2006, 02/25/2009   Polyp x 1 2002, none 2006 or 2010  . CORONARY ARTERY BYPASS GRAFT  06/2009   descending,saphenous vein  graft to first obtuse marginal, sequential saphenous vein graft to posterior descending and posterolateral  . Endoscopic vein harvest right thigh    . PTCA    . TONSILLECTOMY    . UMBILICAL HERNIA REPAIR     2009  . VASECTOMY      Family History  Problem Relation Age of Onset  . Cancer Mother   . Cancer Father     Social History  Substance Use Topics  . Smoking status: Former Smoker    Packs/day: 1.00    Years: 30.00    Types: Cigarettes    Quit date: 03/15/1983  . Smokeless tobacco: Never Used     Comment: Quit 1984  . Alcohol use No     Comment: Quit 1999    Prior to Admission medications   Medication Sig Start Date End Date Taking? Authorizing [place]  allopurinol (ZYLOPRIM) 100 MG tablet TAKE ONE TABLET BY MOUTH ON MONDAY, WEDNESDAY, AND FRIDAY 04/30/16   [name], MD  ALPRAZolam (NIRAVAM) 0.25 MG dissolvable tablet Take 0.25 mg by mouth at bedtime as needed for anxiety.    Historical Provider, MD  aspirin 325 MG tablet Take 325 mg by mouth daily.      Historical Provider, MD  benzonatate (TESSALON) 100 MG capsule Take 1 capsule (100 mg total)  by mouth 2 (two) times daily as needed for cough. 11/01/16   [name], MD  donepezil (ARICEPT ODT) 10 MG disintegrating tablet Take 1 tablet (10 mg total) by mouth at bedtime. 07/02/16   [name], MD  finasteride (PROSCAR) 5 MG tablet TAKE ONE TABLET BY MOUTH DAILY 10/08/16   [name], MD  indomethacin (INDOCIN) 25 MG capsule Take 25 mg by mouth 2 (two) times daily as needed for mild pain or moderate pain (gout).    Historical Provider, MD  indomethacin (INDOCIN) 25 MG capsule TAKE ONE CAPSULE BY MOUTH TWICE DAILY AS NEEDED FOR FOR GOUT PAIN 01/21/17   [name], MD  lisinopril (PRINIVIL,ZESTRIL) 5 MG tablet TAKE ONE TABLET BY MOUTH DAILY 12/13/16   [name], MD  metFORMIN (GLUCOPHAGE-XR) 500 MG 24 hr tablet Take 1 tablet (500 mg total) by mouth daily with breakfast. Patient taking  differently: Take 500 mg by mouth 2 (two) times daily.  02/20/16   [name], MD  metFORMIN (GLUCOPHAGE-XR) 500 MG 24 hr tablet TAKE ONE TABLET BY MOUTH TWICE DAILY WITH MEALS 01/21/17   [name], MD  metoprolol tartrate (LOPRESSOR) 25 MG tablet TAKE ONE TABLET BY MOUTH TWICE DAILY 12/13/16   [name], MD  metroNIDAZOLE (METROCREAM) 0.75 % cream Apply 1 application topically 2 (two) times daily.    Historical Provider, MD  MULTIPLE VITAMIN PO Take 1 tablet by mouth daily.     Historical Provider, MD  nitroGLYCERIN (NITROSTAT) 0.4 MG SL tablet Place 1 tablet (0.4 mg total) under the tongue every 5 (five) minutes as needed for chest pain. 08/20/16 11/18/16  Josue Hector, MD  oxybutynin (DITROPAN-XL) 10 MG 24 hr tablet TAKE ONE TABLET BY MOUTH DAILY 10/08/16   [name], MD  rosuvastatin (CRESTOR) 10 MG tablet TAKE ONE TABLET BY MOUTH DAILY 10/01/16   [name], MD  sodium fluoride (DENTA 5000 PLUS) 1.1 % CREA dental cream Place 1 application onto teeth every evening.    Historical Provider, MD  tamsulosin (FLOMAX) 0.4 MG CAPS capsule TAKE ONE CAPSULE BY MOUTH DAILY 01/21/17   [name], MD  Trospium Chloride 60 MG CP24 Take 1 capsule by mouth daily. 04/09/16   Historical Provider, MD    Allergies Sulfonamide derivatives   REVIEW OF SYSTEMS  Negative except as noted here or in the History of Present Illness.   PHYSICAL EXAMINATION  Initial Vital Signs Blood pressure 147/61, pulse 83, temperature 98.3 F (36.8 C), temperature source Oral, resp. rate 19, SpO2 98 %.  Examination General: Well-developed, well-nourished male in no acute distress; appearance consistent with age of record HENT: normocephalic; atraumatic, hard of hearing Eyes: pupils equal, round and reactive to light; extraocular muscles intact, lense implant Neck: supple Heart: regular rate and rhythm Lungs: clear to auscultation bilaterally Abdomen: soft; nondistended; nontender; no  masses or hepatosplenomegaly; bowel sounds present Extremities: No deformity; full range of motion; pulses normal Neurologic: Awake, alert and oriented; motor function intact in all extremities and symmetric; no facial droop, tremulous  Skin: Warm and dry Psychiatric: Normal mood and affect   RESULTS  Summary of this visit's results, reviewed by myself:   EKG Interpretation  Date/Time:  Wednesday January 30 2017 22:25:37 EDT Ventricular Rate:  85 PR Interval:    QRS Duration: 96 QT Interval:  376 QTC Calculation: 448 R Axis:   59 Text Interpretation:  NSR with artifact Borderline T abnormalities, inferior leads Previously normal  Confirmed by Florina Ou  MD, Jenny Reichmann ([id number]) on 01/30/2017 10:40:22 PM      Laboratory Studies: Results for orders placed or performed during the hospital encounter of 01/30/17 (from the past 24 hour(s))  CBC with Differential     Status: Abnormal   Collection Time: 01/30/17 10:35 PM  Result Value Ref Range   WBC 10.2 4.0 - 10.5 K/uL   RBC 3.79 (L) 4.22 - 5.81 MIL/uL   Hemoglobin 13.0 13.0 - 17.0 g/dL   HCT 38.5 (L) 39.0 - 52.0 %   MCV 101.6 (H) 78.0 - 100.0 fL   MCH 34.3 (H) 26.0 - 34.0 pg   MCHC 33.8 30.0 - 36.0 g/dL   RDW 14.3 11.5 - 15.5 %   Platelets 187 150 - 400 K/uL   Neutrophils Relative % 95 %   Neutro Abs 9.6 (H) 1.7 - 7.7 K/uL   Lymphocytes Relative 3 %   Lymphs Abs 0.4 (L) 0.7 - 4.0 K/uL   Monocytes Relative 2 %   Monocytes Absolute 0.2 0.1 - 1.0 K/uL   Eosinophils Relative 0 %   Eosinophils Absolute 0.0 0.0 - 0.7 K/uL   Basophils Relative 0 %   Basophils Absolute 0.0 0.0 - 0.1 K/uL  I-Stat CG4 Lactic Acid, ED     Status: Abnormal   Collection Time: 01/30/17 10:51 PM  Result Value Ref Range   Lactic Acid, Venous 5.80 (HH) 0.5 - 1.9 mmol/L   Comment NOTIFIED PHYSICIAN   Comprehensive metabolic panel     Status: Abnormal   Collection Time: 01/30/17 11:35 PM  Result Value Ref Range   Sodium 139 135 - 145 mmol/L   Potassium 3.8 3.5 - 5.1  mmol/L   Chloride 103 101 - 111 mmol/L   CO2 25 22 - 32 mmol/L   Glucose, Bld 162 (H) 65 - 99 mg/dL   BUN 16 6 - 20 mg/dL   Creatinine, Ser 1.13 0.61 - 1.24 mg/dL   Calcium 8.6 (L) 8.9 - 10.3 mg/dL   Total Protein 6.9 6.5 - 8.1 g/dL   Albumin 3.9 3.5 - 5.0 g/dL   AST 937 (H) 15 - 41 U/L   ALT 409 (H) 17 - 63 U/L   Alkaline Phosphatase 175 (H) 38 - 126 U/L   Total Bilirubin 2.2 (H) 0.3 - 1.2 mg/dL   GFR calc non Af Amer 59 (L) >60 mL/min   GFR calc Af Amer >60 >60 mL/min   Anion gap 11 5 - 15  I-Stat CG4 Lactic Acid, ED     Status: Abnormal   Collection Time: 01/30/17 11:45 PM  Result Value Ref Range   Lactic Acid, Venous 5.13 (HH) 0.5 - 1.9 mmol/L   Comment NOTIFIED PHYSICIAN   I-Stat CG4 Lactic Acid, ED     Status: Abnormal   Collection Time: 01/31/17  1:53 AM  Result Value Ref Range   Lactic Acid, Venous 4.83 (HH) 0.5 - 1.9 mmol/L   Comment NOTIFIED PHYSICIAN   Urinalysis, Routine w reflex microscopic     Status: Abnormal   Collection Time: 01/31/17  2:00 AM  Result Value Ref Range   Color, Urine AMBER (A) YELLOW   APPearance CLEAR CLEAR   Specific Gravity, Urine 1.027 1.005 - 1.030   pH 6.0 5.0 - 8.0   Glucose, UA NEGATIVE NEGATIVE mg/dL   Hgb urine dipstick NEGATIVE NEGATIVE   Bilirubin Urine SMALL (A) NEGATIVE   Ketones, ur 15 (A) NEGATIVE mg/dL   Protein, ur NEGATIVE NEGATIVE mg/dL  Nitrite NEGATIVE NEGATIVE   Leukocytes, UA NEGATIVE NEGATIVE   Imaging Studies: Ct Abdomen Pelvis W Contrast  Result Date: 01/31/2017 CLINICAL DATA:  Shaking, nausea and vomiting today. History of diverticulosis, umbilical hernia. EXAM: CT ABDOMEN AND PELVIS WITH CONTRAST TECHNIQUE: Multidetector CT imaging of the abdomen and pelvis was performed using the standard protocol following bolus administration of intravenous contrast. CONTRAST:  179m ISOVUE-300 IOPAMIDOL (ISOVUE-300) INJECTION 61% COMPARISON:  None. FINDINGS: LOWER CHEST: Bilateral lower lobe atelectasis. The heart is mildly  enlarged. Status post median sternotomy for CABG. No pericardial effusions. HEPATOBILIARY: Mild gallbladder distention and pericholecystic fluid. Slight intrahepatic biliary dilatation. PANCREAS: Normal. SPLEEN: Normal. ADRENALS/URINARY TRACT: Kidneys are orthotopic, demonstrating symmetric enhancement. No nephrolithiasis, hydronephrosis or solid renal masses. The unopacified ureters are normal in course and caliber. Delayed imaging through the kidneys demonstrates symmetric prompt contrast excretion within the proximal urinary collecting system. Urinary bladder is partially distended and unremarkable. Normal adrenal glands. STOMACH/BOWEL: Severe colonic diverticulosis with rectosigmoid wall thickening and mild pericolonic fat stranding. Focal eccentric wall thickening greater curvature the stomach on axial images corresponds to fold on sagittal series. VASCULAR/LYMPHATIC: Aortoiliac vessels are normal in course and caliber, severe calcific atherosclerosis. Mild porta hepatis lymphadenopathy is likely reactive. Multiple phleboliths in the pelvis. REPRODUCTIVE: Normal. OTHER: No intraperitoneal free fluid or free air. MUSCULOSKELETAL: Nonacute. Grade 1 L5-S1 anterolisthesis on degenerative basis. IMPRESSION: Acute suspected cholecystitis. Mild intrahepatic biliary dilatation. Severe sigmoid diverticulosis and mild acute on chronic sigmoid diverticulitis. These results will be called to the ordering clinician or representative by the Radiologist Assistant, and communication documented in the [facility] Dashboard. Electronically Signed   By: CElon AlasM.D.   On: 01/31/2017 03:09   Dg Chest Port 1 View  Result Date: 01/31/2017 CLINICAL DATA:  82year old male with fever. EXAM: PORTABLE CHEST 1 VIEW COMPARISON:  Chest radiograph dated 05/05/2011 and 02/20/2016 FINDINGS: There is shallow inspiration. No focal consolidation, pleural effusion, or pneumothorax. There is mild prominence of the central vasculature.  The cardiac silhouette is within normal limits. Median sternotomy wires and CABG vascular clips noted. No acute osseous pathology identified. IMPRESSION: No acute cardiopulmonary process.  No focal consolidation. Electronically Signed   By: AAnner CreteM.D.   On: 01/31/2017 00:49    ED COURSE  Nursing notes and initial vitals signs, including pulse oximetry, reviewed.  Vitals:   01/31/17 0317 01/31/17 0330 01/31/17 0400 01/31/17 0421  BP: (!) 98/49 (!) 97/53 (!) 91/47 102/57  Pulse: 80 85 80 80  Resp: 18 19 18 22   Temp:      TempSrc:      SpO2: 97% 97% 96% 97%  Weight:      Height:       Sepsis protocol initiated after initial evaluation. He has gotten Zosyn and vancomycin. It is noted that his LFTs are elevated. He now admits to some mild right upper quadrant pain with associated tenderness.  2:44 AM Patient's blood pressure dipped into the 90s but is improved with additional IV fluids. Lactate remains elevated. Dr[facility] DLoleta [facility]accept[facility] the abdomen and pelvis obtained to evaluate patient's elevated LFTs.  3:15 AM Discussed with Dr. DLoleta Books He still intends to admit the patient to the hospitalist service but requests a surgical consultation as it appears the patient's cholecystitis and/or diverticulitis may be the source of the sepsis. Blood pressures have been maintained in the 981Eand 1563Jsystolic with IV fluids. Pressors have not been necessary.  5:18 AM Patient has been  transferred to [place] Performed by: Wynetta Fines Total critical care time: 60 minutes Critical care time was exclusive of separately billable procedures and treating other patients. Critical care was necessary to treat or prevent imminent or life-threatening deterioration. Critical care was time spent personally by me on the following activities: development of treatment plan with patient and/or surrogate as well as nursing,  discussions with consultants, evaluation of patient's response to treatment, examination of patient, obtaining history from patient or surrogate, ordering and performing treatments and interventions, ordering and review of laboratory studies, ordering and review of radiographic studies, pulse oximetry and re-evaluation of patient's condition.   ED DIAGNOSES     ICD-9-CM ICD-10-CM   1. SIRS (systemic inflammatory response syndrome) (HCC) 995.90 R65.10   2. Fever 780.60 R50.9 DG [facility] 1 View     DG Chest Port 1 View  3. Acute cholecystitis 575.0 K81.0   4. Diverticulitis of large intestine with perforation without abscess or bleeding 562.11 K57.20    569.83     I personally performed the services described in this documentation, which was scribed in my presence. The recorded information has been reviewed and is accurate.    Ricardo Rosser, MD 01/31/17 [phone]

## 2017-01-30 NOTE — ED Triage Notes (Signed)
Arrived ems c/o shaking  N/v today denies diarrhea, Vomited x 5 earlier today.  Thought cbg may be low drank oj,  On arrival of ems cbg 210  C/o upper back pain

## 2017-01-31 ENCOUNTER — Emergency Department (HOSPITAL_BASED_OUTPATIENT_CLINIC_OR_DEPARTMENT_OTHER): Payer: Medicare Other

## 2017-01-31 ENCOUNTER — Encounter (HOSPITAL_COMMUNITY): Payer: Self-pay | Admitting: Physician Assistant

## 2017-01-31 ENCOUNTER — Inpatient Hospital Stay (HOSPITAL_COMMUNITY): Payer: Medicare Other

## 2017-01-31 ENCOUNTER — Inpatient Hospital Stay (HOSPITAL_BASED_OUTPATIENT_CLINIC_OR_DEPARTMENT_OTHER): Payer: Medicare Other

## 2017-01-31 DIAGNOSIS — R740 Nonspecific elevation of levels of transaminase and lactic acid dehydrogenase [LDH]: Secondary | ICD-10-CM | POA: Diagnosis present

## 2017-01-31 DIAGNOSIS — Z8711 Personal history of peptic ulcer disease: Secondary | ICD-10-CM | POA: Diagnosis not present

## 2017-01-31 DIAGNOSIS — K810 Acute cholecystitis: Secondary | ICD-10-CM

## 2017-01-31 DIAGNOSIS — I10 Essential (primary) hypertension: Secondary | ICD-10-CM | POA: Diagnosis present

## 2017-01-31 DIAGNOSIS — R509 Fever, unspecified: Secondary | ICD-10-CM | POA: Diagnosis not present

## 2017-01-31 DIAGNOSIS — M109 Gout, unspecified: Secondary | ICD-10-CM | POA: Diagnosis present

## 2017-01-31 DIAGNOSIS — F0390 Unspecified dementia without behavioral disturbance: Secondary | ICD-10-CM | POA: Diagnosis present

## 2017-01-31 DIAGNOSIS — R932 Abnormal findings on diagnostic imaging of liver and biliary tract: Secondary | ICD-10-CM | POA: Diagnosis not present

## 2017-01-31 DIAGNOSIS — E872 Acidosis: Secondary | ICD-10-CM | POA: Diagnosis present

## 2017-01-31 DIAGNOSIS — R945 Abnormal results of liver function studies: Secondary | ICD-10-CM | POA: Diagnosis present

## 2017-01-31 DIAGNOSIS — K5732 Diverticulitis of large intestine without perforation or abscess without bleeding: Secondary | ICD-10-CM

## 2017-01-31 DIAGNOSIS — Z79899 Other long term (current) drug therapy: Secondary | ICD-10-CM | POA: Diagnosis not present

## 2017-01-31 DIAGNOSIS — A419 Sepsis, unspecified organism: Secondary | ICD-10-CM | POA: Diagnosis present

## 2017-01-31 DIAGNOSIS — I959 Hypotension, unspecified: Secondary | ICD-10-CM | POA: Diagnosis present

## 2017-01-31 DIAGNOSIS — A4159 Other Gram-negative sepsis: Secondary | ICD-10-CM | POA: Diagnosis present

## 2017-01-31 DIAGNOSIS — Z951 Presence of aortocoronary bypass graft: Secondary | ICD-10-CM | POA: Diagnosis not present

## 2017-01-31 DIAGNOSIS — D696 Thrombocytopenia, unspecified: Secondary | ICD-10-CM | POA: Diagnosis present

## 2017-01-31 DIAGNOSIS — K5730 Diverticulosis of large intestine without perforation or abscess without bleeding: Secondary | ICD-10-CM | POA: Diagnosis not present

## 2017-01-31 DIAGNOSIS — Z7984 Long term (current) use of oral hypoglycemic drugs: Secondary | ICD-10-CM | POA: Diagnosis not present

## 2017-01-31 DIAGNOSIS — N400 Enlarged prostate without lower urinary tract symptoms: Secondary | ICD-10-CM | POA: Diagnosis not present

## 2017-01-31 DIAGNOSIS — R109 Unspecified abdominal pain: Secondary | ICD-10-CM | POA: Diagnosis not present

## 2017-01-31 DIAGNOSIS — R1110 Vomiting, unspecified: Secondary | ICD-10-CM | POA: Diagnosis not present

## 2017-01-31 DIAGNOSIS — N289 Disorder of kidney and ureter, unspecified: Secondary | ICD-10-CM | POA: Diagnosis present

## 2017-01-31 DIAGNOSIS — I2510 Atherosclerotic heart disease of native coronary artery without angina pectoris: Secondary | ICD-10-CM | POA: Diagnosis present

## 2017-01-31 DIAGNOSIS — B961 Klebsiella pneumoniae [K. pneumoniae] as the cause of diseases classified elsewhere: Secondary | ICD-10-CM | POA: Diagnosis present

## 2017-01-31 DIAGNOSIS — Z86010 Personal history of colonic polyps: Secondary | ICD-10-CM | POA: Diagnosis not present

## 2017-01-31 DIAGNOSIS — Z87891 Personal history of nicotine dependence: Secondary | ICD-10-CM | POA: Diagnosis not present

## 2017-01-31 DIAGNOSIS — E119 Type 2 diabetes mellitus without complications: Secondary | ICD-10-CM | POA: Diagnosis present

## 2017-01-31 DIAGNOSIS — Z7982 Long term (current) use of aspirin: Secondary | ICD-10-CM | POA: Diagnosis not present

## 2017-01-31 DIAGNOSIS — K819 Cholecystitis, unspecified: Secondary | ICD-10-CM | POA: Diagnosis not present

## 2017-01-31 LAB — COMPREHENSIVE METABOLIC PANEL
ALBUMIN: 3.1 g/dL — AB (ref 3.5–5.0)
ALBUMIN: 3.9 g/dL (ref 3.5–5.0)
ALT: 354 U/L — ABNORMAL HIGH (ref 17–63)
ALT: 409 U/L — AB (ref 17–63)
ANION GAP: 9 (ref 5–15)
AST: 528 U/L — ABNORMAL HIGH (ref 15–41)
AST: 937 U/L — ABNORMAL HIGH (ref 15–41)
Alkaline Phosphatase: 127 U/L — ABNORMAL HIGH (ref 38–126)
Alkaline Phosphatase: 175 U/L — ABNORMAL HIGH (ref 38–126)
Anion gap: 11 (ref 5–15)
BILIRUBIN TOTAL: 3.2 mg/dL — AB (ref 0.3–1.2)
BUN: 15 mg/dL (ref 6–20)
BUN: 16 mg/dL (ref 6–20)
CHLORIDE: 103 mmol/L (ref 101–111)
CO2: 22 mmol/L (ref 22–32)
CO2: 25 mmol/L (ref 22–32)
CREATININE: 1.13 mg/dL (ref 0.61–1.24)
Calcium: 7.7 mg/dL — ABNORMAL LOW (ref 8.9–10.3)
Calcium: 8.6 mg/dL — ABNORMAL LOW (ref 8.9–10.3)
Chloride: 109 mmol/L (ref 101–111)
Creatinine, Ser: 1.29 mg/dL — ABNORMAL HIGH (ref 0.61–1.24)
GFR calc non Af Amer: 59 mL/min — ABNORMAL LOW (ref >60)
GFR, EST AFRICAN AMERICAN: 59 mL/min — AB (ref >60)
GFR, EST NON AFRICAN AMERICAN: 51 mL/min — AB (ref >60)
GLUCOSE: 112 mg/dL — AB (ref 65–99)
GLUCOSE: 162 mg/dL — AB (ref 65–99)
POTASSIUM: 4.2 mmol/L (ref 3.5–5.1)
Potassium: 3.8 mmol/L (ref 3.5–5.1)
SODIUM: 139 mmol/L (ref 135–145)
Sodium: 140 mmol/L (ref 135–145)
TOTAL PROTEIN: 5.7 g/dL — AB (ref 6.5–8.1)
Total Bilirubin: 2.2 mg/dL — ABNORMAL HIGH (ref 0.3–1.2)
Total Protein: 6.9 g/dL (ref 6.5–8.1)

## 2017-01-31 LAB — GLUCOSE, CAPILLARY
GLUCOSE-CAPILLARY: 107 mg/dL — AB (ref 65–99)
GLUCOSE-CAPILLARY: 81 mg/dL (ref 65–99)
GLUCOSE-CAPILLARY: 90 mg/dL (ref 65–99)
Glucose-Capillary: 88 mg/dL (ref 65–99)
Glucose-Capillary: 70 mg/dL (ref 65–99)
Glucose-Capillary: 62 mg/dL — ABNORMAL LOW (ref 65–99)
Glucose-Capillary: 82 mg/dL (ref 65–99)

## 2017-01-31 LAB — I-STAT CG4 LACTIC ACID, ED: LACTIC ACID, VENOUS: 4.83 mmol/L — AB (ref 0.5–1.9)

## 2017-01-31 LAB — CBC WITH DIFFERENTIAL/PLATELET
BASOS ABS: 0 10*3/uL (ref 0.0–0.1)
Basophils Relative: 0 %
EOS ABS: 0 10*3/uL (ref 0.0–0.7)
Eosinophils Relative: 0 %
HEMATOCRIT: 34.8 % — AB (ref 39.0–52.0)
Hemoglobin: 11.8 g/dL — ABNORMAL LOW (ref 13.0–17.0)
LYMPHS ABS: 1.2 10*3/uL (ref 0.7–4.0)
Lymphocytes Relative: 6 %
MCH: 34.3 pg — AB (ref 26.0–34.0)
MCHC: 33.9 g/dL (ref 30.0–36.0)
MCV: 101.2 fL — ABNORMAL HIGH (ref 78.0–100.0)
MONOS PCT: 6 %
Monocytes Absolute: 1.2 10*3/uL — ABNORMAL HIGH (ref 0.1–1.0)
NEUTROS ABS: 17.6 10*3/uL — AB (ref 1.7–7.7)
Neutrophils Relative %: 88 %
Platelets: 131 10*3/uL — ABNORMAL LOW (ref 150–400)
RBC: 3.44 MIL/uL — ABNORMAL LOW (ref 4.22–5.81)
RDW: 14.8 % (ref 11.5–15.5)
WBC: 20 10*3/uL — ABNORMAL HIGH (ref 4.0–10.5)

## 2017-01-31 LAB — BLOOD CULTURE ID PANEL (REFLEXED)
Acinetobacter baumannii: NOT DETECTED
CANDIDA TROPICALIS: NOT DETECTED
Candida albicans: NOT DETECTED
Candida glabrata: NOT DETECTED
Candida krusei: NOT DETECTED
Candida parapsilosis: NOT DETECTED
Carbapenem resistance: NOT DETECTED
ENTEROBACTERIACEAE SPECIES: DETECTED — AB
ENTEROCOCCUS SPECIES: NOT DETECTED
ESCHERICHIA COLI: NOT DETECTED
Enterobacter cloacae complex: NOT DETECTED
HAEMOPHILUS INFLUENZAE: NOT DETECTED
Klebsiella oxytoca: NOT DETECTED
Klebsiella pneumoniae: DETECTED — AB
LISTERIA MONOCYTOGENES: NOT DETECTED
NEISSERIA MENINGITIDIS: NOT DETECTED
PSEUDOMONAS AERUGINOSA: NOT DETECTED
Proteus species: NOT DETECTED
STREPTOCOCCUS AGALACTIAE: NOT DETECTED
STREPTOCOCCUS PNEUMONIAE: NOT DETECTED
STREPTOCOCCUS PYOGENES: NOT DETECTED
STREPTOCOCCUS SPECIES: NOT DETECTED
Serratia marcescens: NOT DETECTED
Staphylococcus aureus (BCID): NOT DETECTED
Staphylococcus species: NOT DETECTED

## 2017-01-31 LAB — URINALYSIS, ROUTINE W REFLEX MICROSCOPIC
Glucose, UA: NEGATIVE mg/dL
Hgb urine dipstick: NEGATIVE
KETONES UR: 15 mg/dL — AB
Leukocytes, UA: NEGATIVE
Nitrite: NEGATIVE
PH: 6 (ref 5.0–8.0)
Protein, ur: NEGATIVE mg/dL
Specific Gravity, Urine: 1.027 (ref 1.005–1.030)

## 2017-01-31 LAB — APTT: APTT: 30 s (ref 24–36)

## 2017-01-31 LAB — MRSA PCR SCREENING: MRSA by PCR: NEGATIVE

## 2017-01-31 LAB — PROTIME-INR
INR: 1.34
PROTHROMBIN TIME: 16.6 s — AB (ref 11.4–15.2)

## 2017-01-31 LAB — LACTIC ACID, PLASMA: Lactic Acid, Venous: 1.8 mmol/L (ref 0.5–1.9)

## 2017-01-31 MED ORDER — OXYBUTYNIN CHLORIDE ER 10 MG PO TB24
10.0000 mg | ORAL_TABLET | Freq: Every day | ORAL | Status: DC
  Filled 2017-01-31: qty 1

## 2017-01-31 MED ORDER — IBUPROFEN 400 MG PO TABS
400.0000 mg | ORAL_TABLET | Freq: Once | ORAL | Status: AC
Start: 2017-01-31 — End: 2017-01-31
  Administered 2017-01-31: 400 mg via ORAL

## 2017-01-31 MED ORDER — VANCOMYCIN HCL IN DEXTROSE 750-5 MG/150ML-% IV SOLN
750.0000 mg | Freq: Two times a day (BID) | INTRAVENOUS | Status: DC
  Filled 2017-01-31: qty 150

## 2017-01-31 MED ORDER — ACETAMINOPHEN 325 MG PO TABS: 650.0000 mg | ORAL_TABLET | Freq: Four times a day (QID) | ORAL | Status: DC | PRN

## 2017-01-31 MED ORDER — IBUPROFEN 400 MG PO TABS
ORAL_TABLET | ORAL | Status: AC
  Filled 2017-01-31: qty 1

## 2017-01-31 MED ORDER — ASPIRIN 325 MG PO TABS
325.0000 mg | ORAL_TABLET | Freq: Every day | ORAL | Status: DC
  Administered 2017-01-31 – 2017-02-04 (×4): 325 mg via ORAL
  Filled 2017-01-31 (×4): qty 1

## 2017-01-31 MED ORDER — TAMSULOSIN HCL 0.4 MG PO CAPS: 0.4000 mg | ORAL_CAPSULE | Freq: Every day | ORAL | Status: DC

## 2017-01-31 MED ORDER — DONEPEZIL HCL 10 MG PO TABS: 10.0000 mg | ORAL_TABLET | Freq: Every day | ORAL | Status: DC

## 2017-01-31 MED ORDER — CIPROFLOXACIN IN D5W 400 MG/200ML IV SOLN
400.0000 mg | Freq: Two times a day (BID) | INTRAVENOUS | Status: DC
  Administered 2017-01-31: 400 mg via INTRAVENOUS
  Filled 2017-01-31: qty 200

## 2017-01-31 MED ORDER — ACETAMINOPHEN 650 MG RE SUPP: 650.0000 mg | Freq: Four times a day (QID) | RECTAL | Status: DC | PRN

## 2017-01-31 MED ORDER — ONDANSETRON HCL 4 MG/2ML IJ SOLN: 4.0000 mg | Freq: Four times a day (QID) | INTRAMUSCULAR | Status: DC | PRN

## 2017-01-31 MED ORDER — METRONIDAZOLE IN NACL 5-0.79 MG/ML-% IV SOLN
500.0000 mg | Freq: Three times a day (TID) | INTRAVENOUS | Status: DC
  Administered 2017-01-31 (×2): 500 mg via INTRAVENOUS
  Filled 2017-01-31 (×2): qty 100

## 2017-01-31 MED ORDER — TECHNETIUM TC 99M MEBROFENIN IV KIT
5.0000 | PACK | Freq: Once | INTRAVENOUS | Status: AC | PRN
  Administered 2017-01-31: 5 via INTRAVENOUS

## 2017-01-31 MED ORDER — SODIUM CHLORIDE 0.9 % IV BOLUS (SEPSIS)
1000.0000 mL | Freq: Once | INTRAVENOUS | Status: AC
  Administered 2017-01-31: 1000 mL via INTRAVENOUS

## 2017-01-31 MED ORDER — PIPERACILLIN-TAZOBACTAM 3.375 G IVPB
3.3750 g | Freq: Three times a day (TID) | INTRAVENOUS | Status: DC
  Administered 2017-01-31 – 2017-02-03 (×7): 3.375 g via INTRAVENOUS
  Filled 2017-01-31 (×11): qty 50

## 2017-01-31 MED ORDER — INSULIN ASPART 100 UNIT/ML ~~LOC~~ SOLN
0.0000 [IU] | SUBCUTANEOUS | Status: DC
  Administered 2017-02-01 – 2017-02-03 (×2): 1 [IU] via SUBCUTANEOUS

## 2017-01-31 MED ORDER — IOPAMIDOL (ISOVUE-300) INJECTION 61%
100.0000 mL | Freq: Once | INTRAVENOUS | Status: AC | PRN
  Administered 2017-01-31: 100 mL via INTRAVENOUS

## 2017-01-31 MED ORDER — PIPERACILLIN-TAZOBACTAM 3.375 G IVPB
3.3750 g | Freq: Three times a day (TID) | INTRAVENOUS | Status: DC
  Administered 2017-01-31: 3.375 g via INTRAVENOUS
  Filled 2017-01-31 (×2): qty 50

## 2017-01-31 MED ORDER — SODIUM CHLORIDE 0.9 % IV SOLN
INTRAVENOUS | Status: AC
  Administered 2017-01-31: 09:00:00 via INTRAVENOUS

## 2017-01-31 MED ORDER — CHLORHEXIDINE GLUCONATE 0.12 % MT SOLN
15.0000 mL | Freq: Two times a day (BID) | OROMUCOSAL | Status: DC
  Administered 2017-01-31 – 2017-02-04 (×8): 15 mL via OROMUCOSAL
  Filled 2017-01-31 (×10): qty 15

## 2017-01-31 MED ORDER — ONDANSETRON HCL 4 MG PO TABS: 4.0000 mg | ORAL_TABLET | Freq: Four times a day (QID) | ORAL | Status: DC | PRN

## 2017-01-31 MED ORDER — FINASTERIDE 5 MG PO TABS: 5.0000 mg | ORAL_TABLET | Freq: Every day | ORAL | Status: DC

## 2017-01-31 MED ORDER — MORPHINE SULFATE (PF) 4 MG/ML IV SOLN
3.0000 mg | Freq: Once | INTRAVENOUS | Status: DC
  Filled 2017-01-31: qty 1

## 2017-01-31 NOTE — Progress Notes (Signed)
Pharmacy Antibiotic Note  Ricardo Washington is a 82 y.o. male admitted on 01/30/2017 with bacteremia.  Pharmacy has been consulted for Zosyn dosing.  Plan: Zosyn 3.375g IV q 8 hrs (extended interval infusion).  Height: 5' 6"  (167.6 cm) Weight: 164 lb 7.4 oz (74.6 kg) IBW/kg (Calculated) : 63.8  Temp (24hrs), Avg:99.2 F (37.3 C), Min:97.5 F (36.4 C), Max:101.3 F (38.5 C)   Recent Labs Lab 01/30/17 2235 01/30/17 2251 01/30/17 2335 01/30/17 2345 01/31/17 0153 01/31/17 0837 01/31/17 1119  WBC 10.2  --   --   --   --  20.0*  --   CREATININE  --   --  1.13  --   --  1.29*  --   LATICACIDVEN  --  5.80*  --  5.13* 4.83*  --  1.8    Estimated Creatinine Clearance: 41.2 mL/min (A) (by C-G formula based on SCr of 1.29 mg/dL (H)).    Allergies  Allergen Reactions  . Sulfonamide Derivatives     Unknown, per pt and "cant stand it"    Antimicrobials this admission: Vanc 3/15>3/15 Zosyn 3/15>3/15 Flagyl 3/15> Cipro 3/15>  Dose adjustments this admission:   Microbiology results: 3/14 Blood >>1/2 GNR *BCID Klebsiella and enterobacteriaceae species 3/15 Urine >> 3/15 MRSA PCR negative  Thank you for allowing pharmacy to be a part of this patient's care.  Uvaldo Rising, BCPS  Clinical Pharmacist Pager [phone]  01/31/2017 8:31 PM

## 2017-01-31 NOTE — ED Notes (Signed)
Called pt's wife and gave her an update

## 2017-01-31 NOTE — ED Notes (Signed)
Pt's wife Damarco Keysor [phone], going home at this time

## 2017-01-31 NOTE — ED Notes (Signed)
[place]  @ [phone] for Dr. Florina Ou

## 2017-01-31 NOTE — Progress Notes (Signed)
Patient BP is 85/47 (58) upon arrival to unit. MD texted paged. Patient asymptomatic. Awaiting further instruction. Will continue to monitor.

## 2017-01-31 NOTE — ED Notes (Signed)
No blood return on second IV for blood cultures

## 2017-01-31 NOTE — ED Notes (Signed)
Attempted another butterfly stick for a set of blood cultures without success, it has been over an hour without successful 2nd set of cultures, ok per MD to start abx.

## 2017-01-31 NOTE — Progress Notes (Addendum)
82 yo M with mild dementia, CAD, NIDDM and HTN presents with chills, nausea, vomiting. Ref MD: [place]  BP (!) 100/45   Pulse 95   Temp 101.3 F (38.5 C) (Oral)   Resp (!) 28   SpO2 93%   Na 139, K 3.8, Cr 1.1, WBC 10.2, Hgb 13 LFTs elevated AST/ALT 700-900  Lactate 5.8 CXR clear  Sepsis unclear source. Got 30 cc/kg bolus, cultures, started on empiric vanc/zosyn. Not made urine yet, so will bolus again and obtain UA/culture.  Will get CT abdomen and pelvis with contrast then transfer to Stepdown, inpatient status.  Repeat lactate still >4, so I discussed with CCM who are aware.  Patient appears stable for SDU per Dr. Florina Ou, suspect persistent lactic acidosis may be component of metformin.  Continue fluids, continue vanc/zosyn, continue to trend lactic acid.  To Stepdown.  I personally spoke with wife by phone, confirmed FULL CODE, yes to lines/pressors/vent if conceivably needed.   ADDENDUM: CT shows evidence of cholecystitis, maybe diverticulitis.  ?surgical abdomen.  EDP will discuss with General Surgery.  Will go to [facility] service.  ADDENDUM 6:36: Spoke with Dr. Hulen Skains, they will evaluate today.  Suspect cholangitis given degree of LFT elevation.  Will need consult to GI today.  RUQ [place] ordered.  Ongoing fluid resuscitation and antibiotics.

## 2017-01-31 NOTE — Progress Notes (Signed)
PHARMACY - PHYSICIAN COMMUNICATION CRITICAL VALUE ALERT - BLOOD CULTURE IDENTIFICATION (BCID)  Results for orders placed or performed during the hospital encounter of 01/30/17  Blood Culture ID Panel (Reflexed) (Collected: 01/30/2017 11:40 PM)  Result Value Ref Range   Enterococcus species NOT DETECTED NOT DETECTED   Listeria monocytogenes NOT DETECTED NOT DETECTED   Staphylococcus species NOT DETECTED NOT DETECTED   Staphylococcus aureus NOT DETECTED NOT DETECTED   Streptococcus species NOT DETECTED NOT DETECTED   Streptococcus agalactiae NOT DETECTED NOT DETECTED   Streptococcus pneumoniae NOT DETECTED NOT DETECTED   Streptococcus pyogenes NOT DETECTED NOT DETECTED   Acinetobacter baumannii NOT DETECTED NOT DETECTED   Enterobacteriaceae species DETECTED (A) NOT DETECTED   Enterobacter cloacae complex NOT DETECTED NOT DETECTED   Escherichia coli NOT DETECTED NOT DETECTED   Klebsiella oxytoca NOT DETECTED NOT DETECTED   Klebsiella pneumoniae DETECTED (A) NOT DETECTED   Proteus species NOT DETECTED NOT DETECTED   Serratia marcescens NOT DETECTED NOT DETECTED   Carbapenem resistance NOT DETECTED NOT DETECTED   Haemophilus influenzae NOT DETECTED NOT DETECTED   Neisseria meningitidis NOT DETECTED NOT DETECTED   Pseudomonas aeruginosa NOT DETECTED NOT DETECTED   Candida albicans NOT DETECTED NOT DETECTED   Candida glabrata NOT DETECTED NOT DETECTED   Candida krusei NOT DETECTED NOT DETECTED   Candida parapsilosis NOT DETECTED NOT DETECTED   Candida tropicalis NOT DETECTED NOT DETECTED    Name of physician (or Provider) ContactedBaltazar Najjar, NP  Changes to prescribed antibiotics required: Change cipro/flagyl to Hilaria Ota 01/31/2017  8:28 PM

## 2017-01-31 NOTE — Plan of Care (Signed)
Problem: Education: Goal: Knowledge of [facility] Education information/materials will improve Outcome: Progressing Discussing plan of care with patient. Patient still has questions as to when he will be discharged but I have discussed why he is still admitted to our unit and he also plans to discuss plan of care with doctor in morning during rounding.

## 2017-01-31 NOTE — Progress Notes (Signed)
Notified Dr. Aggie Moats of patient's BP maintaining 30-73 systolic. MAP 60-65. Patient asymptomatic. New orders received.  Joellen Jersey, RN.

## 2017-01-31 NOTE — ED Notes (Signed)
Patient transported to CT 

## 2017-01-31 NOTE — ED Notes (Signed)
Attempted 2 IV sticks without success

## 2017-01-31 NOTE — Consult Note (Signed)
[facility] Consult: 1:38 PM 01/31/2017  LOS: 0 days    Referring Provider: Dr Aggie Moats  Primary Care Physician:  [name], MD Primary Gastroenterologist:  Dr. Deatra Ina    Reason for Consultation:  Abnormal LFTs   HPI: Ricardo Washington is a generally healthy, functional 82 y.o. male.  PMH early dementia.  BPH. HTN. Severe, colonic, pan diverticulosis.  Adenomatous colon polyps.  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  Gout.  DM 2, well contolled on oral agents.   Colonoscopy 2002: adenomatous colon polyp.  No recurrent polyps but hemorrhoids and severe pan- diverticulosis on follow up scopes in 2006 or 2010.   Acute onset non-bloody N/V; rigors, chills, but no diaphoresis..  Several episodes of nausea and vomiting over a few hours starting yesterday afternoon. Denies any abdominal or chest pain. About 2 weeks ago he had an hour or so of the same symptoms, again did not have any abdominal pain.  Temp 101.2 in ED.  WBCs 20 K.   LFTs in last 24 hours: T bili 2.2 >> 3.2.  Alk phos 175 >> 127.  AST/ALT 937/528 >> 409/354.   Mild renal insufficiency. Lactic acidosis.  Normal INR.  CT ab/pelvis: suspect acute cholecystitis.  "Slight" intrahepatic ductal dilatation.  Mild porta hepatis lymphadenopathy felt to be reactive.  Severe sigmoid diverticulosis with mild acute on chronic diverticulitis.   Ultrasound:  63m thick GB wall without stones/sludge.  Mild intrahepatic biliary ductal dilatation.  6.3 MM CBD.  Mild, bil, perinephric fluid. Ectatic, 2.9 cm abdominal aorta.   In between the episodes 2 weeks ago and the episodes yesterday, the patient felt well. His appetite was good. He was having daily bowel movements, was not having any indigestion or heartburn or fatty food intolerance. Within the past couple of years the patient  intentionally lost about 50 pounds in an effort to get his diabetes under control. He was successful, in that his sugars generally run from the 80s up and he never sees sugars above 120s.  Takes acetaminophen for back pain in thoracic region.  This has been more bothersome in last few months but no acute worsening in last 2 weeks.  No NSAIDs.  Pt does not recall ever being treated with abx for diverticulitis, and no hx diverticular bleeding. .    Surgery not convinced of acute cholecystitis.  Tenderness in left > right mid/lower belly.  No RUQ tenderness.  And, to reiterate, the patient denies any recent abdominal pain. He does complain of thoracic region back pain that has been present for several years and over the last few months has been bothering him more, it is fairly well controlled with when necessary Tylenol. Surgery suggested GI eval and possible MRCP.  Since last night, the patient has remained symptom free.  He feels so well, that he is back to baseline and feels like he could discharge to home.   Past Medical History:  Diagnosis Date  . BPH with obstruction/lower urinary tract symptoms 10/27/2015   Dr. OKarsten Ro . Cataract   . Coronary artery disease   .  Dementia   . Diabetes mellitus without complication ([place])   . Diverticulosis of colon    severe, entire colon  . Erectile dysfunction due to arterial insufficiency   . Fatigue   . Gout    always 2nd toe L foot (uric acid 6.20 Dec 2014 per old records)  . History of adenomatous polyp of colon 2002  . History of stomach ulcers   . Hyperlipidemia   . Hypertension   . Hypogonadism male   . Past use of tobacco    quit 1984  . Rectal bleeding   . Rosacea   . S/P coronary artery bypass graft x 3   . Urine incontinence    Dr. Karsten Ro    Past Surgical History:  Procedure Laterality Date  . CARDIOVASCULAR STRESS TEST     Low risk myoview, normal EF, no ischemia.  Marland Kitchen CATARACT EXTRACTION, BILATERAL    . COLONOSCOPY  2002, 2006,  02/25/2009   Polyp x 1 2002, none 2006 or 2010  . CORONARY ARTERY BYPASS GRAFT  06/2009   descending,saphenous vein graft to first obtuse marginal, sequential saphenous vein graft to posterior descending and posterolateral  . Endoscopic vein harvest right thigh    . PTCA    . TONSILLECTOMY    . UMBILICAL HERNIA REPAIR     2009  . VASECTOMY      Prior to Admission medications   Medication Sig Start Date End Date Taking? Authorizing Provider  allopurinol (ZYLOPRIM) 100 MG tablet TAKE ONE TABLET BY MOUTH ON MONDAY, WEDNESDAY, AND FRIDAY 04/30/16   [name], MD  ALPRAZolam (NIRAVAM) 0.25 MG dissolvable tablet Take 0.25 mg by mouth at bedtime as needed for anxiety.    Historical Provider, MD  aspirin 325 MG tablet Take 325 mg by mouth daily.      Historical Provider, MD  benzonatate (TESSALON) 100 MG capsule Take 1 capsule (100 mg total) by mouth 2 (two) times daily as needed for cough. 11/01/16   [name], MD  donepezil (ARICEPT ODT) 10 MG disintegrating tablet Take 1 tablet (10 mg total) by mouth at bedtime. 07/02/16   [name], MD  finasteride (PROSCAR) 5 MG tablet TAKE ONE TABLET BY MOUTH DAILY 10/08/16   [name], MD  indomethacin (INDOCIN) 25 MG capsule Take 25 mg by mouth 2 (two) times daily as needed for mild pain or moderate pain (gout).    Historical Provider, MD  indomethacin (INDOCIN) 25 MG capsule TAKE ONE CAPSULE BY MOUTH TWICE DAILY AS NEEDED FOR FOR GOUT PAIN 01/21/17   [name], MD  lisinopril (PRINIVIL,ZESTRIL) 5 MG tablet TAKE ONE TABLET BY MOUTH DAILY 12/13/16   [name], MD  metFORMIN (GLUCOPHAGE-XR) 500 MG 24 hr tablet Take 1 tablet (500 mg total) by mouth daily with breakfast. Patient taking differently: Take 500 mg by mouth 2 (two) times daily.  02/20/16   [name], MD  metFORMIN (GLUCOPHAGE-XR) 500 MG 24 hr tablet TAKE ONE TABLET BY MOUTH TWICE DAILY WITH MEALS 01/21/17   [name], MD  metoprolol tartrate (LOPRESSOR) 25  MG tablet TAKE ONE TABLET BY MOUTH TWICE DAILY 12/13/16   [name], MD  metroNIDAZOLE (METROCREAM) 0.75 % cream Apply 1 application topically 2 (two) times daily.    Historical Provider, MD  MULTIPLE VITAMIN PO Take 1 tablet by mouth daily.     Historical Provider, MD  nitroGLYCERIN (NITROSTAT) 0.4 MG SL tablet Place 1 tablet (0.4 mg total)  under the tongue every 5 (five) minutes as needed for chest pain. 08/20/16 11/18/16  Josue Hector, MD  oxybutynin (DITROPAN-XL) 10 MG 24 hr tablet TAKE ONE TABLET BY MOUTH DAILY 10/08/16   [name], MD  rosuvastatin (CRESTOR) 10 MG tablet TAKE ONE TABLET BY MOUTH DAILY 10/01/16   [name], MD  sodium fluoride (DENTA 5000 PLUS) 1.1 % CREA dental cream Place 1 application onto teeth every evening.    Historical Provider, MD  tamsulosin (FLOMAX) 0.4 MG CAPS capsule TAKE ONE CAPSULE BY MOUTH DAILY 01/21/17   [name], MD  Trospium Chloride 60 MG CP24 Take 1 capsule by mouth daily. 04/09/16   Historical Provider, MD    Scheduled Meds: . sodium chloride   Intravenous STAT  . chlorhexidine  15 mL Mouth Rinse BID  . ciprofloxacin  400 mg Intravenous Q12H  . ibuprofen      . insulin aspart  0-9 Units Subcutaneous Q4H  . metronidazole  500 mg Intravenous Q8H   Infusions:  PRN Meds: acetaminophen **OR** acetaminophen, ondansetron **OR** ondansetron (ZOFRAN) IV   Allergies as of 01/30/2017 - Review Complete 01/30/2017  Allergen Reaction Noted  . Sulfonamide derivatives      Family History  Problem Relation Age of Onset  . Cancer Mother   . Cancer Father     pt points to LLQ as  area of cancer, so potentially could have been intestinal.       Social History   Social History  . Marital status: Married    Spouse name: N/A  . Number of children: 3  . Years of education: 34   Occupational History  . Not on file.   Social History Main Topics  . Smoking status: Former Smoker    Packs/day: 1.00    Years: 30.00    Types:  Cigarettes    Quit date: 03/15/1983  . Smokeless tobacco: Never Used     Comment: Quit 1984  . Alcohol use 3.0 - 4.2 oz/week    5 - 7 Glasses of wine per week     Comment: quit etoh 1999 but in ~ 2017 began having a glass of wine with dinner   . Drug use: No  . Sexual activity: No   Other Topics Concern  . Not on file   Social History Narrative   Married 1957, has 3 sons.   [place]. 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    End of life Care: DNR, no prolonged heroic measures or prolonged supportive care.    Former smoker, quit 1984.   Quit alcohol when dx'd with DM in 2009. ~ 2017 began having glass of wine with dinner.   No exercise.       REVIEW OF SYSTEMS: Constitutional:  No weakness, no fatigue. No falls.  In recent months has not been going to the gym as he did previously.  ENT:  Patient has a lot of watering in the right eye where he has a lower lid entropion.  No nose bleeds Pulm:  No cough, no shortness of breath. CV:  No palpitations, no LE edema.  No chest pain GU:  No hematuria, no frequency.  No amber colored urine. GI:  Per HPI Heme:  No unusual bleeding or bruising.   Transfusions:  Patient does not recall prior blood product transfusions. Neuro:  No headaches, no peripheral tingling or numbness Musc/skeletal:  Stiffness in the fingers.  Patient  only gets gout, which consists of podagra, if he eats cheaper cuts of meat for a few days in a row. He hasn't had a gout flare in several months. 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  He is set up for "blue light" therapy to these in the next week or so.  No itching, no rash.  Endocrine:  No sweats or chills.  No polyuria or dysuria.   Immunization:  Up-to-date on his flu vaccination. Travel:  None beyond local counties in last few months.    PHYSICAL EXAM: Vital signs in last 24  hours: Vitals:   01/31/17 0724 01/31/17 1131  BP: (!) 98/42 (!) 97/54  Pulse: 71 70  Resp:  18  Temp: 98.1 F (36.7 C) 97.6 F (36.4 C)   Wt Readings from Last 3 Encounters:  01/31/17 74.6 kg (164 lb 7.4 oz)  11/01/16 74.6 kg (164 lb 8 oz)  10/02/16 73.9 kg (163 lb)    General: Pleasant, well-appearing, alert and comfortable elderly WM Head:  No facial asymmetry or swelling. No signs of head trauma.  Eyes:  No scleral icterus. Hearing in the left eye with lower lid entropion.   Ears:  Not hard of hearing.  Nose:  No discharge or congestion. Mouth:  Tongue midline. Oropharynx is moist, clear, pink. Neck:  No JVD, no TMG or masses. Lungs:  Clear bilaterally. No cough or labored breathing. Heart: RRR. No MRG. S1, S2 present. Abdomen:  Not tender. Active bowel sounds. No hernias, masses, organomegaly, bruits.  .   Rectal: Deferred   Musc/Skeltl: Some arthritic changes in the fingers. No contracture deformities. No joint erythema or swelling. Extremities:  Mild, nonpitting dorsal pedal edema.  Neurologic:  Patient is alert. Oriented times 3. If I had not read that he carries a diagnosis of dementia, I would not have suspected this. He moves all 4 limbs, strength is full. Skin:  Some scabs on top of right ear and forehead Tattoos:  none Nodes:  No cervical adenopathy   Psych:  Pleasant, calm, cooperative.   Intake/Output from previous day: 03/14 0701 - 03/15 0700 In: 3420 [P.O.:120; IV Piggyback:3300] Out: -  Intake/Output this shift: Total I/O In: 800 [I.V.:450; IV Piggyback:350] Out: 175 [Urine:175]  LAB RESULTS:  Recent Labs  01/30/17 2235 01/31/17 0837  WBC 10.2 20.0*  HGB 13.0 11.8*  HCT 38.5* 34.8*  PLT 187 131*   BMET Lab Results  Component Value Date   NA 140 01/31/2017   NA 139 01/30/2017   NA 142 11/01/2016   K 4.2 01/31/2017   K 3.8 01/30/2017   K 4.8 11/01/2016   CL 109 01/31/2017   CL 103 01/30/2017   CL 103 11/01/2016   CO2 22 01/31/2017    CO2 25 01/30/2017   CO2 34 (H) 11/01/2016   GLUCOSE 112 (H) 01/31/2017   GLUCOSE 162 (H) 01/30/2017   GLUCOSE 111 (H) 11/01/2016   BUN 15 01/31/2017   BUN 16 01/30/2017   BUN 16 11/01/2016   CREATININE 1.29 (H) 01/31/2017   CREATININE 1.13 01/30/2017   CREATININE 1.02 11/01/2016   CALCIUM 7.7 (L) 01/31/2017   CALCIUM 8.6 (L) 01/30/2017   CALCIUM 9.1 11/01/2016   LFT  Recent Labs  01/30/17 2335 01/31/17 0837  PROT 6.9 5.7*  ALBUMIN 3.9 3.1*  AST 937* 528*  ALT 409* 354*  ALKPHOS 175* 127*  BILITOT 2.2* 3.2*   PT/INR Lab Results  Component Value Date   INR 1.34 01/31/2017   INR 1.4 07/14/2009   INR 1.0 07/13/2009    RADIOLOGY STUDIES: US Abdomen Complete  Result Date: 01/31/2017 CLINICAL DATA:  Elevated LFTs. EXAM: ABDOMEN ULTRASOUND COMPLETE COMPARISON:  CT 01/31/2017. FINDINGS: Gallbladder: No gallstones noted. Gallbladder wall thickening to 10.9 mm. This can be seen with hyperproteinemia and or cholecystitis. Negative Murphy sign. Common bile duct: Diameter: 6.3 mm Liver: No focal lesion identified. Within normal limits in parenchymal echogenicity. Mild intrahepatic biliary ductal dilatation again noted. IVC: No abnormality visualized. Pancreas: Visualized portion unremarkable. Spleen: Size and appearance within normal limits. Right Kidney: Length: 11.5 cm. Echogenicity within normal limits. No mass or hydronephrosis visualized. Small amount of perinephric fluid noted. Left Kidney: Length: 10.8 cm. Echogenicity within normal limits. No mass or hydronephrosis visualized. Small amount of perinephric fluid noted. Abdominal aorta: Mild abdominal aortic ectasia 2.9 cm. Other findings: None. IMPRESSION: 1. No gallstones. Gallbladder wall prominent thickening at 10.9 mm. This could be from hyperproteinemia and /or cholecystitis. 2. Mild intrahepatic biliary ductal dilatation again noted. Common bile duct is normal in caliber at 6.3 mm. 3.  Mild amount of perinephric fluid noted. 4.  Mild abdominal aortic ectasia 2.9 cm. Ectatic abdominal aorta at risk for aneurysm development. Recommend followup by ultrasound in 5 years. This recommendation follows ACR consensus guidelines: White Paper of the ACR Incidental Findings Committee II on Vascular Findings. J Am Coll Radiol 2013; 10:789-794. Electronically Signed   By: [place]   On: 01/31/2017 10:22   Ct Abdomen Pelvis W Contrast  Result Date: 01/31/2017 CLINICAL DATA:  Shaking, nausea and vomiting today. History of diverticulosis, umbilical hernia. EXAM: CT ABDOMEN AND PELVIS WITH CONTRAST TECHNIQUE: Multidetector CT imaging of the abdomen and pelvis was performed using the standard protocol following bolus administration of intravenous contrast. CONTRAST:  142m ISOVUE-300 IOPAMIDOL (ISOVUE-300) INJECTION 61% COMPARISON:  None. FINDINGS: LOWER CHEST: Bilateral lower lobe atelectasis. The heart is mildly enlarged. Status post median sternotomy for CABG. No pericardial effusions. HEPATOBILIARY: Mild gallbladder distention and pericholecystic fluid. Slight intrahepatic biliary dilatation. PANCREAS: Normal. SPLEEN: Normal. ADRENALS/URINARY TRACT: Kidneys are orthotopic, demonstrating symmetric enhancement. No nephrolithiasis, hydronephrosis or solid renal masses. The unopacified ureters are normal in course and caliber. Delayed imaging through the kidneys demonstrates symmetric prompt contrast excretion within the proximal urinary collecting system. Urinary bladder is partially distended and unremarkable. Normal adrenal glands. STOMACH/BOWEL: Severe colonic diverticulosis with rectosigmoid wall thickening and mild pericolonic fat stranding. Focal eccentric wall thickening greater curvature the stomach on axial images corresponds to fold on sagittal series. VASCULAR/LYMPHATIC: Aortoiliac vessels are normal in course and caliber, severe calcific atherosclerosis. Mild porta hepatis lymphadenopathy is likely reactive. Multiple phleboliths in  the pelvis. REPRODUCTIVE: Normal. OTHER: No intraperitoneal free fluid or free air. MUSCULOSKELETAL: Nonacute. Grade 1 L5-S1 anterolisthesis on degenerative basis. IMPRESSION: Acute suspected cholecystitis. Mild intrahepatic biliary dilatation. Severe sigmoid diverticulosis and mild acute on chronic sigmoid diverticulitis. These results will be called to the ordering clinician or representative by the Radiologist Assistant, and communication documented in the [facility] Dashboard. Electronically Signed   By: CElon AlasM.D.   On: 01/31/2017 03:09   Dg Chest Port 1 View  Result Date: 01/31/2017 CLINICAL DATA:  82year old male with fever. EXAM: PORTABLE CHEST 1 VIEW COMPARISON:  Chest radiograph dated 05/05/2011 and 02/20/2016 FINDINGS: There is shallow inspiration. No focal consolidation, pleural effusion, or pneumothorax. There is mild prominence of the central vasculature. The cardiac silhouette is within normal limits. Median sternotomy wires and  CABG vascular clips noted. No acute osseous pathology identified. IMPRESSION: No acute cardiopulmonary process.  No focal consolidation. Electronically Signed   By: Anner Crete M.D.   On: 01/31/2017 00:49     IMPRESSION:   *  Acute nausea, vomiting, rigors/chills, fever and leukocytosis with abnormal LFTs (now improving).  Patient did not have abdominal tenderness to my exam, though was somewhat tender on surgeon's exam. He repeatedly denies feeling any abdominal pain with yesterday's episodes or the similar episode 2 weeks ago.  Imaging suggest cholecystitis and sigmoid diverticulitis.  ? Acalculous cholecystitis? Wonder about passed CBD stone?Marland Kitchen  Though lactic acid is elevated, pt not clinically septic. Abx given include Zosyn, vanc: x 1 dose.  Now on Flagyl and Cipro IV.    *  Non-critical thrombocytopenia.    *  Mild dementia.  Not severe enough to limit his ability to consent to tests/procedures.    PLAN:     *  Case d/w Dr Carlean Purl,  ordered HIDA scan.    *  FYI, looking at the detailed social history, it is listed that the patient had wished to be DO NOT RESUSCITATE with no heroic measures. But I note that his current CODE STATUS is full code.   Azucena Freed  01/31/2017, 1:38 PM Pager: [phone]    [facility] Attending   I have taken an interval history, reviewed the chart and examined the patient. I agree with the Advanced Practitioner's note, impression and recommendations.   I wonder if he doesn't have acalculous cholecystitis and intermittent (2) bacteremia related - will get HIDA to see. Abx may be adequate Tx alone. I am less suspicious of bile duct stone and passage.  Not sure what the acute on chronic diverticulitis reading on CT is - I looked at images and wonder if it is not more severe diverticulosis.  Gatha Mayer, MD, [facility] [phone] (pager) [phone] after 5 PM, weekends and holidays  01/31/2017 5:11 PM

## 2017-01-31 NOTE — Care Management Note (Signed)
Case Management Note  Patient Details  Name: Ricardo Washington MRN: [id number] Date of Birth: 08-15-1935  Subjective/Objective:  Pt admitted on 01/30/17 with sepsis in setting of mild diverticulitis, possible acute cholecystitis.  PTA, pt independent, lives with spouse.                  Action/Plan: Will follow for discharge planning as pt progresses.    Expected Discharge Date:                  Expected Discharge Plan:  Home/Self Care  In-House Referral:     Discharge planning Services  CM Consult  Post Acute Care Choice:    Choice offered to:     DME Arranged:    DME Agency:     HH Arranged:    HH Agency:     Status of Service:  In process, will continue to follow  If discussed at Long Length of Stay Meetings, dates discussed:    Additional Comments:  Ella Bodo, RN 01/31/2017, 3:49 PM

## 2017-01-31 NOTE — Progress Notes (Signed)
Contacted patient's wife and give her an update on his new location per patient request.

## 2017-01-31 NOTE — Consult Note (Signed)
[facility] Consult/Admission Note  Ricardo Washington March 06, 1935  [id number].    Requesting MD: Dr. Loleta Books Chief Complaint/Reason for Consult: Possible cholecystitis   HPI:   Pt is a 82 year old male with a history of Umbilical hernia repair 5003, CABG 2012, CAD, DM, dementia, diverticulosis, gout, HTN who presented to the ED with complaints of shaking and chills since yesterday. He also had nausea and vomiting and unable to tolerate PO. He states this happened roughly 2 weeks ago but the symptoms resolved. He states he was eating chocolate and nuts before the onset of symptoms both times. He states no abdominal pain or any pain. He states he has intermittent mild LLQ abdominal pain. He stated he has "problems with my colon". When asked if he had bouts of diverticulitis he was unsure what that was. He denies abdominal pain, CP, SOB, cough, fever, blood in his vomit, change in bowel habit, dysuria or hematuria.  ED Course: BP (!) 100/45   Pulse 95   Temp 101.3 F (38.5 C) (Oral)   Resp (!) 28   SpO2 93%   Labs: WBC 10.2, Lactic acid 5.8 CXR: unremarkable  CT abd/pel: Acute suspected cholecystitis. Mild intrahepatic biliary dilatation. Severe sigmoid diverticulosis and mild acute on chronic sigmoid diverticulitis.  ROS:  Review of Systems  Constitutional: Positive for chills. Negative for diaphoresis and fever.  HENT: Negative for congestion and sore throat.   Eyes: Negative for pain and discharge.  Respiratory: Negative for cough and shortness of breath.   Cardiovascular: Negative for chest pain and leg swelling.  Gastrointestinal: Positive for nausea and vomiting. Negative for abdominal pain, blood in stool, constipation and diarrhea.  Genitourinary: Negative for dysuria and hematuria.  Skin: Negative for itching and rash.  Neurological: Negative for dizziness, loss of consciousness and headaches.  All other systems reviewed and are negative.    Family History   Problem Relation Age of Onset  . Cancer Mother   . Cancer Father     Past Medical History:  Diagnosis Date  . BPH with obstruction/lower urinary tract symptoms 10/27/2015   Dr. Karsten Ro  . Cataract   . Coronary artery disease   . Dementia   . Diabetes mellitus without complication ([place])   . Diverticulosis of colon    severe, entire colon  . Erectile dysfunction due to arterial insufficiency   . Fatigue   . Gout    always 2nd toe L foot (uric acid 6.20 Dec 2014 per old records)  . History of adenomatous polyp of colon 2002  . History of stomach ulcers   . Hyperlipidemia   . Hypertension   . Hypogonadism male   . Past use of tobacco    quit 1984  . Rectal bleeding   . Rosacea   . S/P coronary artery bypass graft x 3   . Urine incontinence    Dr. Karsten Ro    Past Surgical History:  Procedure Laterality Date  . CARDIOVASCULAR STRESS TEST     Low risk myoview, normal EF, no ischemia.  Marland Kitchen CATARACT EXTRACTION, BILATERAL    . COLONOSCOPY  2002, 2006, 02/25/2009   Polyp x 1 2002, none 2006 or 2010  . CORONARY ARTERY BYPASS GRAFT  06/2009   descending,saphenous vein graft to first obtuse marginal, sequential saphenous vein graft to posterior descending and posterolateral  . Endoscopic vein harvest right thigh    . PTCA    . TONSILLECTOMY    . UMBILICAL HERNIA REPAIR  2009  . VASECTOMY      Social History:  reports that he quit smoking about 33 years ago. His smoking use included Cigarettes. He has a 30.00 pack-year smoking history. He has never used smokeless tobacco. He reports that he does not drink alcohol or use drugs.  Allergies:  Allergies  Allergen Reactions  . Sulfonamide Derivatives     Unknown, per pt and "cant stand it"    Medications Prior to Admission  Medication Sig Dispense Refill  . allopurinol (ZYLOPRIM) 100 MG tablet TAKE ONE TABLET BY MOUTH ON MONDAY, WEDNESDAY, AND FRIDAY 30 tablet 3  . ALPRAZolam (NIRAVAM) 0.25 MG dissolvable tablet Take 0.25 mg  by mouth at bedtime as needed for anxiety.    Marland Kitchen aspirin 325 MG tablet Take 325 mg by mouth daily.      . benzonatate (TESSALON) 100 MG capsule Take 1 capsule (100 mg total) by mouth 2 (two) times daily as needed for cough. 20 capsule 0  . donepezil (ARICEPT ODT) 10 MG disintegrating tablet Take 1 tablet (10 mg total) by mouth at bedtime. 30 tablet 11  . finasteride (PROSCAR) 5 MG tablet TAKE ONE TABLET BY MOUTH DAILY 90 tablet 1  . indomethacin (INDOCIN) 25 MG capsule Take 25 mg by mouth 2 (two) times daily as needed for mild pain or moderate pain (gout).    . indomethacin (INDOCIN) 25 MG capsule TAKE ONE CAPSULE BY MOUTH TWICE DAILY AS NEEDED FOR FOR GOUT PAIN 30 capsule 1  . lisinopril (PRINIVIL,ZESTRIL) 5 MG tablet TAKE ONE TABLET BY MOUTH DAILY 90 tablet 1  . metFORMIN (GLUCOPHAGE-XR) 500 MG 24 hr tablet Take 1 tablet (500 mg total) by mouth daily with breakfast. (Patient taking differently: Take 500 mg by mouth 2 (two) times daily. ) 180 tablet 6  . metFORMIN (GLUCOPHAGE-XR) 500 MG 24 hr tablet TAKE ONE TABLET BY MOUTH TWICE DAILY WITH MEALS 180 tablet 1  . metoprolol tartrate (LOPRESSOR) 25 MG tablet TAKE ONE TABLET BY MOUTH TWICE DAILY 180 tablet 1  . metroNIDAZOLE (METROCREAM) 0.75 % cream Apply 1 application topically 2 (two) times daily.    . MULTIPLE VITAMIN PO Take 1 tablet by mouth daily.     . nitroGLYCERIN (NITROSTAT) 0.4 MG SL tablet Place 1 tablet (0.4 mg total) under the tongue every 5 (five) minutes as needed for chest pain. 25 tablet 3  . oxybutynin (DITROPAN-XL) 10 MG 24 hr tablet TAKE ONE TABLET BY MOUTH DAILY 90 tablet 1  . rosuvastatin (CRESTOR) 10 MG tablet TAKE ONE TABLET BY MOUTH DAILY 90 tablet 1  . sodium fluoride (DENTA 5000 PLUS) 1.1 % CREA dental cream Place 1 application onto teeth every evening.    . tamsulosin (FLOMAX) 0.4 MG CAPS capsule TAKE ONE CAPSULE BY MOUTH DAILY 90 capsule 1  . Trospium Chloride 60 MG CP24 Take 1 capsule by mouth daily.  3    Blood  pressure (!) 98/42, pulse 71, temperature 99 F (37.2 C), temperature source Oral, resp. rate (!) 8, height 5' 6"  (1.676 m), weight 164 lb 7.4 oz (74.6 kg), SpO2 98 %.  Physical Exam  Constitutional: He is oriented to person, place, and time and well-developed, well-nourished, and in no distress. No distress.  Well appearing, pleasant, elderly white male, appears younger than stated age  HENT:  Head: Normocephalic and atraumatic.  Nose: Nose normal.  Eyes: Conjunctivae are normal. Right eye exhibits no discharge. Left eye exhibits no discharge. No scleral icterus.  Neck: Normal range of motion.  Neck supple.  Cardiovascular: Normal rate, regular rhythm, normal heart sounds and intact distal pulses.  Exam reveals no gallop and no friction rub.   No murmur heard. Pulses:      Radial pulses are 2+ on the right side, and 2+ on the left side.  Pulmonary/Chest: Effort normal and breath sounds normal. He has no wheezes. He has no rhonchi. He has no rales.  Abdominal: Soft. Normal appearance and bowel sounds are normal. He exhibits no distension. There is tenderness in the left lower quadrant. There is no rigidity, no rebound and no guarding.  Defect noted at umbilicus, nontender, small mass at superior aspect, no surrounding erythema or edema  Musculoskeletal: He exhibits no edema, tenderness or deformity.  Neurological: He is alert and oriented to person, place, and time. GCS score is 15.  Skin: Skin is warm and dry. He is not diaphoretic.  Psychiatric: Mood and affect normal.  Nursing note and vitals reviewed.   Results for orders placed or performed during the hospital encounter of 01/30/17 (from the past 48 hour(s))  CBC with Differential     Status: Abnormal   Collection Time: 01/30/17 10:35 PM  Result Value Ref Range   WBC 10.2 4.0 - 10.5 K/uL   RBC 3.79 (L) 4.22 - 5.81 MIL/uL   Hemoglobin 13.0 13.0 - 17.0 g/dL   HCT 38.5 (L) 39.0 - 52.0 %   MCV 101.6 (H) 78.0 - 100.0 fL   MCH 34.3  (H) 26.0 - 34.0 pg   MCHC 33.8 30.0 - 36.0 g/dL   RDW 14.3 11.5 - 15.5 %   Platelets 187 150 - 400 K/uL   Neutrophils Relative % 95 %   Neutro Abs 9.6 (H) 1.7 - 7.7 K/uL   Lymphocytes Relative 3 %   Lymphs Abs 0.4 (L) 0.7 - 4.0 K/uL   Monocytes Relative 2 %   Monocytes Absolute 0.2 0.1 - 1.0 K/uL   Eosinophils Relative 0 %   Eosinophils Absolute 0.0 0.0 - 0.7 K/uL   Basophils Relative 0 %   Basophils Absolute 0.0 0.0 - 0.1 K/uL  I-Stat CG4 Lactic Acid, ED     Status: Abnormal   Collection Time: 01/30/17 10:51 PM  Result Value Ref Range   Lactic Acid, Venous 5.80 (HH) 0.5 - 1.9 mmol/L   Comment NOTIFIED PHYSICIAN   Comprehensive metabolic panel     Status: Abnormal   Collection Time: 01/30/17 11:35 PM  Result Value Ref Range   Sodium 139 135 - 145 mmol/L   Potassium 3.8 3.5 - 5.1 mmol/L   Chloride 103 101 - 111 mmol/L   CO2 25 22 - 32 mmol/L   Glucose, Bld 162 (H) 65 - 99 mg/dL   BUN 16 6 - 20 mg/dL   Creatinine, Ser 1.13 0.61 - 1.24 mg/dL   Calcium 8.6 (L) 8.9 - 10.3 mg/dL   Total Protein 6.9 6.5 - 8.1 g/dL   Albumin 3.9 3.5 - 5.0 g/dL   AST 937 (H) 15 - 41 U/L   ALT 409 (H) 17 - 63 U/L   Alkaline Phosphatase 175 (H) 38 - 126 U/L   Total Bilirubin 2.2 (H) 0.3 - 1.2 mg/dL   GFR calc non Af Amer 59 (L) >60 mL/min   GFR calc Af Amer >60 >60 mL/min    Comment: (NOTE) The eGFR has been calculated using the CKD EPI equation. This calculation has not been validated in all clinical situations. eGFR's persistently <60 mL/min signify possible Chronic Kidney Disease.  Anion gap 11 5 - 15  I-Stat CG4 Lactic Acid, ED     Status: Abnormal   Collection Time: 01/30/17 11:45 PM  Result Value Ref Range   Lactic Acid, Venous 5.13 (HH) 0.5 - 1.9 mmol/L   Comment NOTIFIED PHYSICIAN   I-Stat CG4 Lactic Acid, ED     Status: Abnormal   Collection Time: 01/31/17  1:53 AM  Result Value Ref Range   Lactic Acid, Venous 4.83 (HH) 0.5 - 1.9 mmol/L   Comment NOTIFIED PHYSICIAN   Urinalysis,  Routine w reflex microscopic     Status: Abnormal   Collection Time: 01/31/17  2:00 AM  Result Value Ref Range   Color, Urine AMBER (A) YELLOW    Comment: BIOCHEMICALS MAY BE AFFECTED BY COLOR   APPearance CLEAR CLEAR   Specific Gravity, Urine 1.027 1.005 - 1.030   pH 6.0 5.0 - 8.0   Glucose, UA NEGATIVE NEGATIVE mg/dL   Hgb urine dipstick NEGATIVE NEGATIVE   Bilirubin Urine SMALL (A) NEGATIVE   Ketones, ur 15 (A) NEGATIVE mg/dL   Protein, ur NEGATIVE NEGATIVE mg/dL   Nitrite NEGATIVE NEGATIVE   Leukocytes, UA NEGATIVE NEGATIVE    Comment: Microscopic not done on urines with negative protein, blood, leukocytes, nitrite, or glucose < 500 mg/dL.   Ct Abdomen Pelvis W Contrast  Result Date: 01/31/2017 CLINICAL DATA:  Shaking, nausea and vomiting today. History of diverticulosis, umbilical hernia. EXAM: CT ABDOMEN AND PELVIS WITH CONTRAST TECHNIQUE: Multidetector CT imaging of the abdomen and pelvis was performed using the standard protocol following bolus administration of intravenous contrast. CONTRAST:  122m ISOVUE-300 IOPAMIDOL (ISOVUE-300) INJECTION 61% COMPARISON:  None. FINDINGS: LOWER CHEST: Bilateral lower lobe atelectasis. The heart is mildly enlarged. Status post median sternotomy for CABG. No pericardial effusions. HEPATOBILIARY: Mild gallbladder distention and pericholecystic fluid. Slight intrahepatic biliary dilatation. PANCREAS: Normal. SPLEEN: Normal. ADRENALS/URINARY TRACT: Kidneys are orthotopic, demonstrating symmetric enhancement. No nephrolithiasis, hydronephrosis or solid renal masses. The unopacified ureters are normal in course and caliber. Delayed imaging through the kidneys demonstrates symmetric prompt contrast excretion within the proximal urinary collecting system. Urinary bladder is partially distended and unremarkable. Normal adrenal glands. STOMACH/BOWEL: Severe colonic diverticulosis with rectosigmoid wall thickening and mild pericolonic fat stranding. Focal  eccentric wall thickening greater curvature the stomach on axial images corresponds to fold on sagittal series. VASCULAR/LYMPHATIC: Aortoiliac vessels are normal in course and caliber, severe calcific atherosclerosis. Mild porta hepatis lymphadenopathy is likely reactive. Multiple phleboliths in the pelvis. REPRODUCTIVE: Normal. OTHER: No intraperitoneal free fluid or free air. MUSCULOSKELETAL: Nonacute. Grade 1 L5-S1 anterolisthesis on degenerative basis. IMPRESSION: Acute suspected cholecystitis. Mild intrahepatic biliary dilatation. Severe sigmoid diverticulosis and mild acute on chronic sigmoid diverticulitis. These results will be called to the ordering clinician or representative by the Radiologist Assistant, and communication documented in the [facility] Dashboard. Electronically Signed   By: CElon AlasM.D.   On: 01/31/2017 03:09   Dg Chest Port 1 View  Result Date: 01/31/2017 CLINICAL DATA:  82year old male with fever. EXAM: PORTABLE CHEST 1 VIEW COMPARISON:  Chest radiograph dated 05/05/2011 and 02/20/2016 FINDINGS: There is shallow inspiration. No focal consolidation, pleural effusion, or pneumothorax. There is mild prominence of the central vasculature. The cardiac silhouette is within normal limits. Median sternotomy wires and CABG vascular clips noted. No acute osseous pathology identified. IMPRESSION: No acute cardiopulmonary process.  No focal consolidation. Electronically Signed   By: AAnner CreteM.D.   On: 01/31/2017 00:49      Assessment/Plan  Sepsis with  vomiting - no complaints of abdominal pain but TTP in LLQ on exam - pt states he has problems with his colon with intermittent LLQ abdominal pain. This could be diverticulitis with CT showing mild acute on chronic sigmoid diverticulitis. - [place] abd pending to rule out cholecystitis   Recommend Zosyn for possible diverticulitis. Recommend discontinuing flagyl and vac and continuing Zosyn. We will continue to follow. Pending  [place]. Thank you for the consult.   Kalman Drape, [facility] Surgery 01/31/2017, 7:43 AM Pager: [phone] Consults: [phone] Mon-Fri 7:00 am-4:30 pm Sat-Sun 7:00 am-11:30 am

## 2017-01-31 NOTE — Progress Notes (Signed)
Pharmacy Antibiotic Note  Ricardo Washington is a 82 y.o. male admitted on 01/30/2017 with sepsis.  Pharmacy has been consulted for Vancocin and Zosyn dosing.  Plan: Vancomycin 1081m in ED then 7559mIV every 12 hours.  Goal trough 15-20 mcg/mL. Zosyn 3.375g IV q8h (4 hour infusion).  Height: 5' 6"  (167.6 cm) Weight: 164 lb 7.4 oz (74.6 kg) IBW/kg (Calculated) : 63.8  Temp (24hrs), Avg:100.5 F (38.1 C), Min:98.3 F (36.8 C), Max:101.3 F (38.5 C)   Recent Labs Lab 01/30/17 2235 01/30/17 2251 01/30/17 2335 01/30/17 2345 01/31/17 0153  WBC 10.2  --   --   --   --   CREATININE  --   --  1.13  --   --   LATICACIDVEN  --  5.80*  --  5.13* 4.83*    Estimated Creatinine Clearance: 47.1 mL/min (by C-G formula based on SCr of 1.13 mg/dL).    Allergies  Allergen Reactions  . Sulfonamide Derivatives     Unknown, per pt and "cant stand it"     Thank you for allowing pharmacy to be a part of this patient's care.  VeWynona NeatPharmD, BCPS  01/31/2017 2:15 AM

## 2017-01-31 NOTE — ED Notes (Signed)
SATS 90% on room air. Placed on 2lt , SATS improved to 93%.

## 2017-01-31 NOTE — H&P (Signed)
[facility] History and Physical  [name] XVQ:[id number] DOB: September 27, 1935 DOA: 01/30/2017  Referring physician: Dr. Florina Ou, [facility] PCP: [name], MD  Cardiology: Johnsie Cancel  Chief Complaint: nausea/vomiting  HPI: Ricardo Washington is a 82 y.o. male pmh of CAD s/p CABG 03/2011, DM2, htn, Gout, mild dementia, diverticulosis presented to the ED with complaints of nausea and vomiting x 2 days. Pt states symptoms began shortly after eating some candy and pistachios. He reports ~5 episodes of vomiting, he denies any diarrehea, melena or hematochezia. Pt reports similar symptoms a couple of weeks ago that resolved on there own, but this time had chills so presented to ED. He describes some lower abdominal cramping, but denies any pain. He denies recent headache, dizziness, chest pain or sob.   ED COURSE In ED, BP as low as 85/47, Tmax 101.3. Initial lactic acid 5.8 a/w elevated LFTs. CT abdomen and pelvis shows mild acute on chronic sigmoid diverticulitis and suspected cholecystitis. Abdominal u/s pending. Blood cultures obtains. Pt received NS bolus and started on empiric vancomycin and zosyn.   Review of Systems:  As stated in hpi, otherwise negative   Past Medical History:  Diagnosis Date  . BPH with obstruction/lower urinary tract symptoms 10/27/2015   Dr. Karsten Ro  . Cataract   . Coronary artery disease   . Dementia   . Diabetes mellitus without complication ([place])   . Diverticulosis of colon    severe, entire colon  . Erectile dysfunction due to arterial insufficiency   . Fatigue   . Gout    always 2nd toe L foot (uric acid 6.20 Dec 2014 per old records)  . History of adenomatous polyp of colon 2002  . History of stomach ulcers   . Hyperlipidemia   . Hypertension   . Hypogonadism male   . Past use of tobacco    quit 1984  . Rectal bleeding   . Rosacea   . S/P coronary artery bypass graft x 3   . Urine incontinence    Dr. Karsten Ro    Social History:   reports that he quit smoking about 33 years ago. His smoking use included Cigarettes. He has a 30.00 pack-year smoking history. He has never used smokeless tobacco. He reports that he does not drink alcohol or use drugs.  Allergies  Allergen Reactions  . Sulfonamide Derivatives     Unknown, per pt and "cant stand it"    Family History  Problem Relation Age of Onset  . Cancer Mother   . Cancer Father      Prior to Admission medications   Medication Sig Start Date End Date Taking? Authorizing Provider  allopurinol (ZYLOPRIM) 100 MG tablet TAKE ONE TABLET BY MOUTH ON MONDAY, WEDNESDAY, AND FRIDAY 04/30/16   [name], MD  ALPRAZolam (NIRAVAM) 0.25 MG dissolvable tablet Take 0.25 mg by mouth at bedtime as needed for anxiety.    Historical Provider, MD  aspirin 325 MG tablet Take 325 mg by mouth daily.      Historical Provider, MD  benzonatate (TESSALON) 100 MG capsule Take 1 capsule (100 mg total) by mouth 2 (two) times daily as needed for cough. 11/01/16   [name], MD  donepezil (ARICEPT ODT) 10 MG disintegrating tablet Take 1 tablet (10 mg total) by mouth at bedtime. 07/02/16   [name], MD  finasteride (PROSCAR) 5 MG tablet TAKE ONE TABLET BY MOUTH DAILY 10/08/16   [name], MD  indomethacin (INDOCIN) 25  MG capsule Take 25 mg by mouth 2 (two) times daily as needed for mild pain or moderate pain (gout).    Historical Provider, MD  indomethacin (INDOCIN) 25 MG capsule TAKE ONE CAPSULE BY MOUTH TWICE DAILY AS NEEDED FOR FOR GOUT PAIN 01/21/17   [name], MD  lisinopril (PRINIVIL,ZESTRIL) 5 MG tablet TAKE ONE TABLET BY MOUTH DAILY 12/13/16   [name], MD  metFORMIN (GLUCOPHAGE-XR) 500 MG 24 hr tablet Take 1 tablet (500 mg total) by mouth daily with breakfast. Patient taking differently: Take 500 mg by mouth 2 (two) times daily.  02/20/16   [name], MD  metFORMIN (GLUCOPHAGE-XR) 500 MG 24 hr tablet TAKE ONE TABLET BY MOUTH TWICE DAILY WITH MEALS  01/21/17   [name], MD  metoprolol tartrate (LOPRESSOR) 25 MG tablet TAKE ONE TABLET BY MOUTH TWICE DAILY 12/13/16   [name], MD  metroNIDAZOLE (METROCREAM) 0.75 % cream Apply 1 application topically 2 (two) times daily.    Historical Provider, MD  MULTIPLE VITAMIN PO Take 1 tablet by mouth daily.     Historical Provider, MD  nitroGLYCERIN (NITROSTAT) 0.4 MG SL tablet Place 1 tablet (0.4 mg total) under the tongue every 5 (five) minutes as needed for chest pain. 08/20/16 11/18/16  Josue Hector, MD  oxybutynin (DITROPAN-XL) 10 MG 24 hr tablet TAKE ONE TABLET BY MOUTH DAILY 10/08/16   [name], MD  rosuvastatin (CRESTOR) 10 MG tablet TAKE ONE TABLET BY MOUTH DAILY 10/01/16   [name], MD  sodium fluoride (DENTA 5000 PLUS) 1.1 % CREA dental cream Place 1 application onto teeth every evening.    Historical Provider, MD  tamsulosin (FLOMAX) 0.4 MG CAPS capsule TAKE ONE CAPSULE BY MOUTH DAILY 01/21/17   [name], MD  Trospium Chloride 60 MG CP24 Take 1 capsule by mouth daily. 04/09/16   Historical Provider, MD   Physical Exam: Vitals:   01/31/17 0400 01/31/17 0421 01/31/17 0534 01/31/17 0724  BP: (!) 91/47 102/57 (!) 85/47 (!) 98/42  Pulse: 80 80 68 71  Resp: 18 22 (!) 8   Temp:   99 F (37.2 C) 98.1 F (36.7 C)  TempSrc:   Oral Oral  SpO2: 96% 97% 97% 98%  Weight:      Height:        Wt Readings from Last 3 Encounters:  01/31/17 74.6 kg (164 lb 7.4 oz)  11/01/16 74.6 kg (164 lb 8 oz)  10/02/16 73.9 kg (163 lb)    General: Appears calm and comfortable Eyes: PERRL, EOMI, normal lids, iris ENT: hard of hearing, mucous membranes slightly dry Neck: no LAD, masses or thyromegaly Cardiovascular: RRR, no m/r/g. No LE edema.  Respiratory: CTA bilaterally, no w/r/r. Normal respiratory effort. Abdomen: soft, ntnd, BS+. - Murphys sign Skin: no rash or induration seen on limited exam Musculoskeletal: grossly normal tone BUE/BLE Psychiatric: grossly normal  mood and affect, speech fluent and appropriate Neurologic: CN 2-12 grossly intact          Labs on Admission:  Basic Metabolic Panel:  Recent Labs Lab 01/30/17 2335  NA 139  K 3.8  CL 103  CO2 25  GLUCOSE 162*  BUN 16  CREATININE 1.13  CALCIUM 8.6*   Liver Function Tests:  Recent Labs Lab 01/30/17 2335  AST 937*  ALT 409*  ALKPHOS 175*  BILITOT 2.2*  PROT 6.9  ALBUMIN 3.9   No results for input(s): LIPASE, AMYLASE in the last 168 hours.  No results for input(s): AMMONIA in the last 168 hours. CBC:  Recent Labs Lab 01/30/17 2235  WBC 10.2  NEUTROABS 9.6*  HGB 13.0  HCT 38.5*  MCV 101.6*  PLT 187   Cardiac Enzymes: No results for input(s): CKTOTAL, CKMB, CKMBINDEX, TROPONINI in the last 168 hours.  BNP (last 3 results) No results for input(s): BNP in the last 8760 hours.  ProBNP (last 3 results) No results for input(s): PROBNP in the last 8760 hours.   Serum creatinine: 1.13 mg/dL 01/30/17 2335 Estimated creatinine clearance: 47.1 mL/min  CBG: No results for input(s): GLUCAP in the last 168 hours.  Radiological Exams on Admission: Ct Abdomen Pelvis W Contrast  Result Date: 01/31/2017 CLINICAL DATA:  Shaking, nausea and vomiting today. History of diverticulosis, umbilical hernia. EXAM: CT ABDOMEN AND PELVIS WITH CONTRAST TECHNIQUE: Multidetector CT imaging of the abdomen and pelvis was performed using the standard protocol following bolus administration of intravenous contrast. CONTRAST:  134m ISOVUE-300 IOPAMIDOL (ISOVUE-300) INJECTION 61% COMPARISON:  None. FINDINGS: LOWER CHEST: Bilateral lower lobe atelectasis. The heart is mildly enlarged. Status post median sternotomy for CABG. No pericardial effusions. HEPATOBILIARY: Mild gallbladder distention and pericholecystic fluid. Slight intrahepatic biliary dilatation. PANCREAS: Normal. SPLEEN: Normal. ADRENALS/URINARY TRACT: Kidneys are orthotopic, demonstrating symmetric enhancement. No nephrolithiasis,  hydronephrosis or solid renal masses. The unopacified ureters are normal in course and caliber. Delayed imaging through the kidneys demonstrates symmetric prompt contrast excretion within the proximal urinary collecting system. Urinary bladder is partially distended and unremarkable. Normal adrenal glands. STOMACH/BOWEL: Severe colonic diverticulosis with rectosigmoid wall thickening and mild pericolonic fat stranding. Focal eccentric wall thickening greater curvature the stomach on axial images corresponds to fold on sagittal series. VASCULAR/LYMPHATIC: Aortoiliac vessels are normal in course and caliber, severe calcific atherosclerosis. Mild porta hepatis lymphadenopathy is likely reactive. Multiple phleboliths in the pelvis. REPRODUCTIVE: Normal. OTHER: No intraperitoneal free fluid or free air. MUSCULOSKELETAL: Nonacute. Grade 1 L5-S1 anterolisthesis on degenerative basis. IMPRESSION: Acute suspected cholecystitis. Mild intrahepatic biliary dilatation. Severe sigmoid diverticulosis and mild acute on chronic sigmoid diverticulitis. These results will be called to the ordering clinician or representative by the Radiologist Assistant, and communication documented in the [employer] Dashboard. Electronically Signed   By: CElon AlasM.D.   On: 01/31/2017 03:09   Dg Chest Port 1 View  Result Date: 01/31/2017 CLINICAL DATA:  82year old male with fever. EXAM: PORTABLE CHEST 1 VIEW COMPARISON:  Chest radiograph dated 05/05/2011 and 02/20/2016 FINDINGS: There is shallow inspiration. No focal consolidation, pleural effusion, or pneumothorax. There is mild prominence of the central vasculature. The cardiac silhouette is within normal limits. Median sternotomy wires and CABG vascular clips noted. No acute osseous pathology identified. IMPRESSION: No acute cardiopulmonary process.  No focal consolidation. Electronically Signed   By: AAnner CreteM.D.   On: 01/31/2017 00:49    EKG: Independently reviewed. NSR at  85bpm  Assessment/Plan Active Problems:   Essential hypertension   Coronary atherosclerosis   Diverticulosis of colon   BPH (benign prostatic hyperplasia)   CORONARY ARTERY BYPASS GRAFT, THREE VESSEL, HX OF   Memory loss   Diabetes mellitus, type 2 (HCC)   [place]   Acute cholecystitis   Diverticulitis of colon   Sepsis in setting of mild diverticulitis +/- acute cholecystitis -as evidenced by T101.2, hypotension improved with NS bolus, Lactic acid 5.8 (down to 4.83 this am) -recheck lactic acid this am -continue Vancomycin, Zosyn and Flagyl -pancultures pending. Currently VSS, pt is NT appearing with relatively benign exam  Acute cholecystitis  -appreciate surgical asisstance. Will likely need GI consult.  -NPO for now.  -Awaiting abdominal u/s. -Monitor LFT trend -Continue current antibiotics  Diverticulitis, acute on chronic -add Flagyl to current regimen  Hx CAD s/p CABG -no cardiac complaints -will hold ASA for now and await surgery/GI recs  Hx DM2 -hold home meds given above issues -SSI for now  Hx hypertension -low BP on admit. Will hold home BB for now  Hx dementia -mild -resume med with po's  Hx BPH -resume meds with po's    Code Status: full code DVT Prophylaxis: SCDs for now. Needs surgical consult Family Communication: awaiting wife's arrival Disposition Plan: Pending Improvement    Dionne Milo, NP [phone] [facility] [email or web address] Password [email or web address]

## 2017-02-01 DIAGNOSIS — N400 Enlarged prostate without lower urinary tract symptoms: Secondary | ICD-10-CM

## 2017-02-01 DIAGNOSIS — K819 Cholecystitis, unspecified: Secondary | ICD-10-CM

## 2017-02-01 DIAGNOSIS — K810 Acute cholecystitis: Secondary | ICD-10-CM

## 2017-02-01 LAB — CBC
HCT: 31.4 % — ABNORMAL LOW (ref 39.0–52.0)
HEMOGLOBIN: 10.5 g/dL — AB (ref 13.0–17.0)
MCH: 34.1 pg — AB (ref 26.0–34.0)
MCHC: 33.4 g/dL (ref 30.0–36.0)
MCV: 101.9 fL — AB (ref 78.0–100.0)
Platelets: 130 10*3/uL — ABNORMAL LOW (ref 150–400)
RBC: 3.08 MIL/uL — AB (ref 4.22–5.81)
RDW: 15.5 % (ref 11.5–15.5)
WBC: 11 10*3/uL — ABNORMAL HIGH (ref 4.0–10.5)

## 2017-02-01 LAB — URINE CULTURE

## 2017-02-01 LAB — COMPREHENSIVE METABOLIC PANEL
ALK PHOS: 111 U/L (ref 38–126)
ALT: 197 U/L — AB (ref 17–63)
AST: 172 U/L — ABNORMAL HIGH (ref 15–41)
Albumin: 2.6 g/dL — ABNORMAL LOW (ref 3.5–5.0)
Anion gap: 9 (ref 5–15)
BILIRUBIN TOTAL: 2.5 mg/dL — AB (ref 0.3–1.2)
BUN: 12 mg/dL (ref 6–20)
CALCIUM: 7.3 mg/dL — AB (ref 8.9–10.3)
CO2: 23 mmol/L (ref 22–32)
CREATININE: 1.18 mg/dL (ref 0.61–1.24)
Chloride: 110 mmol/L (ref 101–111)
GFR calc non Af Amer: 56 mL/min — ABNORMAL LOW (ref >60)
Glucose, Bld: 80 mg/dL (ref 65–99)
Potassium: 4.1 mmol/L (ref 3.5–5.1)
Sodium: 142 mmol/L (ref 135–145)
Total Protein: 4.8 g/dL — ABNORMAL LOW (ref 6.5–8.1)

## 2017-02-01 LAB — GLUCOSE, CAPILLARY
GLUCOSE-CAPILLARY: 79 mg/dL (ref 65–99)
GLUCOSE-CAPILLARY: 88 mg/dL (ref 65–99)
Glucose-Capillary: 73 mg/dL (ref 65–99)
Glucose-Capillary: 100 mg/dL — ABNORMAL HIGH (ref 65–99)
Glucose-Capillary: 134 mg/dL — ABNORMAL HIGH (ref 65–99)
Glucose-Capillary: 76 mg/dL (ref 65–99)

## 2017-02-01 LAB — PROTIME-INR
INR: 1.46
PROTHROMBIN TIME: 17.9 s — AB (ref 11.4–15.2)

## 2017-02-01 MED ORDER — SODIUM CHLORIDE 0.9 % IV SOLN
INTRAVENOUS | Status: DC
  Administered 2017-02-01 – 2017-02-02 (×2): via INTRAVENOUS

## 2017-02-01 MED ORDER — SODIUM CHLORIDE 0.9 % IV SOLN
INTRAVENOUS | Status: DC
  Administered 2017-02-01: 10:00:00 via INTRAVENOUS

## 2017-02-01 MED ORDER — SODIUM CHLORIDE 0.9 % IV BOLUS (SEPSIS)
500.0000 mL | Freq: Once | INTRAVENOUS | Status: AC
  Administered 2017-02-01: 500 mL via INTRAVENOUS

## 2017-02-01 MED ORDER — DEXTROSE-NACL 5-0.9 % IV SOLN
INTRAVENOUS | Status: DC
  Administered 2017-02-01: 11:00:00 via INTRAVENOUS

## 2017-02-01 MED ORDER — SODIUM CHLORIDE 0.9 % IV SOLN
INTRAVENOUS | Status: DC
  Administered 2017-02-01: 01:00:00 via INTRAVENOUS

## 2017-02-01 NOTE — Progress Notes (Signed)
Hypoglycemic Event  CBG: 62  Treatment:  4oz of orange juice   Symptoms: N/A  Follow-up CBG: Time: 2220 CBG Result: 82  Possible Reasons for Event: Patient NPO also septic   Lenna Sciara, RN

## 2017-02-01 NOTE — Progress Notes (Signed)
[facility] PROGRESS NOTE  Ricardo Washington IWO:[id number] DOB: May 04, 1935 DOA: 01/30/2017   PCP: [name], MD     Assessment/Plan: Active Problems:   Essential hypertension   Coronary atherosclerosis   Diverticulosis of colon   BPH (benign prostatic hyperplasia)   CORONARY ARTERY BYPASS GRAFT, THREE VESSEL, HX OF   Memory loss   Diabetes mellitus, type 2 (HCC)   Sepsis, unspecified organism ([place])   Acute cholecystitis   Diverticulitis of colon   HPI: Ricardo Washington is a generally healthy, functional 82 y.o. male.  PMH early dementia.  BPH. HTN. Severe, colonic, pan diverticulosis.  Adenomatous colon polyps.  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  Gout.  DM 2, well contolled on oral agents presented with Acute onset non-bloody N/V; rigors, chills. Temp 101.2 in ED.  WBCs 20 K.   Liver function tests found to be grossly of normal T bili 2.2 >> 3.2.  Alk phos 175 >> 127.  AST/ALT 937/528 >> 409/354.   CT ab/pelvis: suspect acute cholecystitis.  "Slight" intrahepatic ductal dilatation.  Mild porta hepatis lymphadenopathy felt to be reactive.  Severe sigmoid diverticulosis with mild acute on chronic diverticulitis.   Ultrasound:  41m thick GB wall without stones/sludge.  Mild intrahepatic biliary ductal dilatation.  6.3 MM CBD.  Mild, bil, perinephric fluid. Ectatic, 2.9 cm abdominal aorta.   Assessment and plan Transaminitis in the setting of fever HIDA scan inconclusive for acute cholecystitis Imaging suggest cholecystitis and sigmoid diverticulitis.  ? Acalculous cholecystitis? Wonder about passed CBD stone?.Marland Kitchen Abx given include Zosyn, vanc: x 1 dose.  Now on Flagyl and Cipro IV.     Blood culture positive for Enterobacter and Klebsiella, antibiotics changed back to Zosyn HIDA showed no uptake into gallbladder so this could be a acalculous cholecystitis Increase IV fluids to 125 mL/h  IR holding off on the drain for now after discussing with CCS  Hx CAD s/p CABG -will hold ASA  for now and await surgery/GI recs/IR  recommendations  Hx DM2 -hold home meds given above issues -SSI for now  Hx hypertension -low BP on admit. Will hold home BB for now  Hx dementia -mild -resume med with po's  Hx BPH -resume meds with po's   DVT prophylaxsis SCDs  Code Status:  Full code     Family Communication: Discussed in detail with the patient, all imaging results, lab results explained to the patient   Disposition Plan:   Continue antibiotics for acalculous cholecystitis      Consultants:  Surgery  GI  IR  Procedures:  None  Antibiotics: Anti-infectives    Start     Dose/Rate Route Frequency Ordered Stop   01/31/17 2045  piperacillin-tazobactam (ZOSYN) IVPB 3.375 g     3.375 g 12.5 mL/hr over 240 Minutes Intravenous Every 8 hours 01/30/17 2033         HPI/Subjective:  Patient extremely aggravated, because no decisions have been made regarding his gallbladder  Objective: Vitals:   01/31/17 2333 02/01/17 0000 02/01/17 0309 02/01/17 0731  BP: (!) 90/47 (!) 90/47 (!) 100/53 (!) 121/59  Pulse: 63 60 60 (!) 55  Resp:      Temp: 99 F (37.2 C)  98 F (36.7 C) 98.2 F (36.8 C)  TempSrc: Oral  Oral Oral  SpO2: 93% 90% 92% 93%  Weight:      Height:        Intake/Output Summary (Last 24 hours) at 02/01/17 0913 Last data filed  at 02/01/17 0700  Gross per 24 hour  Intake          2709.58 ml  Output              925 ml  Net          1784.58 ml    Exam:  Examination:  General exam: Appears calm and comfortable  Respiratory system: Clear to auscultation. Respiratory effort normal. Cardiovascular system: S1 & S2 heard, RRR. No JVD, murmurs, rubs, gallops or clicks. No pedal edema. Gastrointestinal system: Abdomen is nondistended, soft and nontender. No organomegaly or masses felt. Normal bowel sounds heard. Central nervous system: Alert and oriented. No focal neurological deficits. Extremities: Symmetric 5 x 5 power. Skin: No  rashes, lesions or ulcers Psychiatry: Judgement and insight appear normal. Mood & affect appropriate.     Data Reviewed: I have personally reviewed following labs and imaging studies  Micro Results Recent Results (from the past 240 hour(s))  Blood culture (routine x 2)     Status: None (Preliminary result)   Collection Time: 01/30/17 11:40 PM  Result Value Ref Range Status   Specimen Description BLOOD RIGHT ANTECUBITAL  Final   Special Requests   Final    BOTTLES DRAWN AEROBIC AND ANAEROBIC AER 5cc ANA 5cc   Culture  Setup Time   Final    GRAM NEGATIVE RODS AEROBIC BOTTLE ONLY Organism ID to follow CRITICAL RESULT CALLED TO, READ BACK BY AND VERIFIED WITHMordecai Rasmussen RN 1946 01/31/17 A BROWNING Performed at [facility], [address] [place], [place] [address]    Culture GRAM NEGATIVE RODS  Final   Report Status PENDING  Incomplete  Blood Culture ID Panel (Reflexed)     Status: Abnormal   Collection Time: 01/30/17 11:40 PM  Result Value Ref Range Status   Enterococcus species NOT DETECTED NOT DETECTED Final   Listeria monocytogenes NOT DETECTED NOT DETECTED Final   Staphylococcus species NOT DETECTED NOT DETECTED Final   Staphylococcus aureus NOT DETECTED NOT DETECTED Final   Streptococcus species NOT DETECTED NOT DETECTED Final   Streptococcus agalactiae NOT DETECTED NOT DETECTED Final   Streptococcus pneumoniae NOT DETECTED NOT DETECTED Final   Streptococcus pyogenes NOT DETECTED NOT DETECTED Final   Acinetobacter baumannii NOT DETECTED NOT DETECTED Final   Enterobacteriaceae species DETECTED (A) NOT DETECTED Final    Comment: Enterobacteriaceae represent a large family of gram-negative bacteria, not a single organism. CRITICAL RESULT CALLED TO, READ BACK BY AND VERIFIED WITH: Mordecai Rasmussen RN [id number] 01/31/17 A BROWNING    Enterobacter cloacae complex NOT DETECTED NOT DETECTED Final   Escherichia coli NOT DETECTED NOT DETECTED Final   Klebsiella oxytoca NOT DETECTED NOT  DETECTED Final   Klebsiella pneumoniae DETECTED (A) NOT DETECTED Final    Comment: CRITICAL RESULT CALLED TO, READ BACK BY AND VERIFIED WITH: Mordecai Rasmussen RN 1946 01/31/17 A BROWNING    Proteus species NOT DETECTED NOT DETECTED Final   Serratia marcescens NOT DETECTED NOT DETECTED Final   Carbapenem resistance NOT DETECTED NOT DETECTED Final   Haemophilus influenzae NOT DETECTED NOT DETECTED Final   Neisseria meningitidis NOT DETECTED NOT DETECTED Final   Pseudomonas aeruginosa NOT DETECTED NOT DETECTED Final   Candida albicans NOT DETECTED NOT DETECTED Final   Candida glabrata NOT DETECTED NOT DETECTED Final   Candida krusei NOT DETECTED NOT DETECTED Final   Candida parapsilosis NOT DETECTED NOT DETECTED Final   Candida tropicalis NOT DETECTED NOT DETECTED Final    Comment:  Performed at [facility], [place] [address] [place], [place] [address]  MRSA PCR Screening     Status: None   Collection Time: 01/31/17  7:36 AM  Result Value Ref Range Status   MRSA by PCR NEGATIVE NEGATIVE Final    Comment:        The GeneXpert MRSA Assay (FDA approved for NASAL specimens only), is one component of a comprehensive MRSA colonization surveillance program. It is not intended to diagnose MRSA infection nor to guide or monitor treatment for MRSA infections.   Urine culture     Status: Abnormal   Collection Time: 01/31/17  8:57 AM  Result Value Ref Range Status   Specimen Description URINE, RANDOM  Final   Special Requests NONE  Final   Culture <10,000 COLONIES/mL >=100,000 COLONIES/mL (A)  Final   Report Status 02/01/2017 FINAL  Final    Radiology Reports Nm Hepatobiliary Including Gb  Result Date: 01/31/2017 CLINICAL DATA:  82 year old male with elevated LFTs, abdominal pain, and nausea and vomiting and chills. EXAM: NUCLEAR MEDICINE HEPATOBILIARY IMAGING TECHNIQUE: Sequential images of the abdomen were obtained out to 60 minutes following intravenous administration of  radiopharmaceutical. RADIOPHARMACEUTICALS:  5.3 mCi Tc-86m Choletec IV COMPARISON:  Abdominal ultrasound dated 01/31/2017 and CT dated 01/31/2017 FINDINGS: There is prompt uptake of the radiotracer by the liver. No activity was seen in the gallbladder during the first 1 hour imaging. No significant activity in the bowel noted. Continued imaging was performed for an additional bladder. No uptake was noted within the gallbladder after a total of 2 hours. Radiotracer was noted within the small-bowel. It was decided to proceed with administration of morphine at this time. However, no morphine was given as the patient's blood pressure was low. IMPRESSION: No activity identified within the gallbladder after 2 hours of imaging. These findings are inconclusive for definite diagnosis of acute cholecystitis as no Morphine could be administered due to patient's low blood pressure. Electronically Signed   By: AAnner CreteM.D.   On: 01/31/2017 21:01   UKoreaAbdomen Complete  Result Date: 01/31/2017 CLINICAL DATA:  Elevated LFTs. EXAM: ABDOMEN ULTRASOUND COMPLETE COMPARISON:  CT 01/31/2017. FINDINGS: Gallbladder: No gallstones noted. Gallbladder wall thickening to 10.9 mm. This can be seen with hyperproteinemia and or cholecystitis. Negative Murphy sign. Common bile duct: Diameter: 6.3 mm Liver: No focal lesion identified. Within normal limits in parenchymal echogenicity. Mild intrahepatic biliary ductal dilatation again noted. IVC: No abnormality visualized. Pancreas: Visualized portion unremarkable. Spleen: Size and appearance within normal limits. Right Kidney: Length: 11.5 cm. Echogenicity within normal limits. No mass or hydronephrosis visualized. Small amount of perinephric fluid noted. Left Kidney: Length: 10.8 cm. Echogenicity within normal limits. No mass or hydronephrosis visualized. Small amount of perinephric fluid noted. Abdominal aorta: Mild abdominal aortic ectasia 2.9 cm. Other findings: None. IMPRESSION:  1. No gallstones. Gallbladder wall prominent thickening at 10.9 mm. This could be from hyperproteinemia and /or cholecystitis. 2. Mild intrahepatic biliary ductal dilatation again noted. Common bile duct is normal in caliber at 6.3 mm. 3.  Mild amount of perinephric fluid noted. 4. Mild abdominal aortic ectasia 2.9 cm. Ectatic abdominal aorta at risk for aneurysm development. Recommend followup by ultrasound in 5 years. This recommendation follows ACR consensus guidelines: White Paper of the ACR Incidental Findings Committee II on Vascular Findings. J Am Coll Radiol 2013; 10:789-794. Electronically Signed   By: TLoraine  On: 01/31/2017 10:22   Ct Abdomen Pelvis W Contrast  Result Date:  01/31/2017 CLINICAL DATA:  Shaking, nausea and vomiting today. History of diverticulosis, umbilical hernia. EXAM: CT ABDOMEN AND PELVIS WITH CONTRAST TECHNIQUE: Multidetector CT imaging of the abdomen and pelvis was performed using the standard protocol following bolus administration of intravenous contrast. CONTRAST:  123m ISOVUE-300 IOPAMIDOL (ISOVUE-300) INJECTION 61% COMPARISON:  None. FINDINGS: LOWER CHEST: Bilateral lower lobe atelectasis. The heart is mildly enlarged. Status post median sternotomy for CABG. No pericardial effusions. HEPATOBILIARY: Mild gallbladder distention and pericholecystic fluid. Slight intrahepatic biliary dilatation. PANCREAS: Normal. SPLEEN: Normal. ADRENALS/URINARY TRACT: Kidneys are orthotopic, demonstrating symmetric enhancement. No nephrolithiasis, hydronephrosis or solid renal masses. The unopacified ureters are normal in course and caliber. Delayed imaging through the kidneys demonstrates symmetric prompt contrast excretion within the proximal urinary collecting system. Urinary bladder is partially distended and unremarkable. Normal adrenal glands. STOMACH/BOWEL: Severe colonic diverticulosis with rectosigmoid wall thickening and mild pericolonic fat stranding. Focal eccentric wall  thickening greater curvature the stomach on axial images corresponds to fold on sagittal series. VASCULAR/LYMPHATIC: Aortoiliac vessels are normal in course and caliber, severe calcific atherosclerosis. Mild porta hepatis lymphadenopathy is likely reactive. Multiple phleboliths in the pelvis. REPRODUCTIVE: Normal. OTHER: No intraperitoneal free fluid or free air. MUSCULOSKELETAL: Nonacute. Grade 1 L5-S1 anterolisthesis on degenerative basis. IMPRESSION: Acute suspected cholecystitis. Mild intrahepatic biliary dilatation. Severe sigmoid diverticulosis and mild acute on chronic sigmoid diverticulitis. These results will be called to the ordering clinician or representative by the Radiologist Assistant, and communication documented in the [facility] Dashboard. Electronically Signed   By: CElon AlasM.D.   On: 01/31/2017 03:09   Dg Chest Port 1 View  Result Date: 01/31/2017 CLINICAL DATA:  82year old male with fever. EXAM: PORTABLE CHEST 1 VIEW COMPARISON:  Chest radiograph dated 05/05/2011 and 02/20/2016 FINDINGS: There is shallow inspiration. No focal consolidation, pleural effusion, or pneumothorax. There is mild prominence of the central vasculature. The cardiac silhouette is within normal limits. Median sternotomy wires and CABG vascular clips noted. No acute osseous pathology identified. IMPRESSION: No acute cardiopulmonary process.  No focal consolidation. Electronically Signed   By: AAnner CreteM.D.   On: 01/31/2017 00:49     CBC  Recent Labs Lab 01/30/17 2235 01/31/17 0837 02/01/17 0409  WBC 10.2 20.0* 11.0*  HGB 13.0 11.8* 10.5*  HCT 38.5* 34.8* 31.4*  PLT 187 131* 130*  MCV 101.6* 101.2* 101.9*  MCH 34.3* 34.3* 34.1*  MCHC 33.8 33.9 33.4  RDW 14.3 14.8 15.5  LYMPHSABS 0.4* 1.2  --   MONOABS 0.2 1.2*  --   EOSABS 0.0 0.0  --   BASOSABS 0.0 0.0  --     Chemistries   Recent Labs Lab 01/30/17 2335 01/31/17 0837 02/01/17 0409  NA 139 140 142  K 3.8 4.2 4.1  CL 103 109  110  CO2 _0 GLUCOSE 162* 112* 80  BUN _1 CREATININE 1.13 1.29* 1.18  CALCIUM 8.6* 7.7* 7.3*  AST 937* 528* 172*  ALT 409* 354* 197*  ALKPHOS 175* 127* 111  BILITOT 2.2* 3.2* 2.5*   ------------------------------------------------------------------------------------------------------------------ estimated creatinine clearance is 45.1 mL/min (by C-G formula based on SCr of 1.18 mg/dL). ------------------------------------------------------------------------------------------------------------------ No results for input(s): HGBA1C in the last 72 hours. ------------------------------------------------------------------------------------------------------------------ No results for input(s): CHOL, HDL, LDLCALC, TRIG, CHOLHDL, LDLDIRECT in the last 72 hours. ------------------------------------------------------------------------------------------------------------------ No results for input(s): TSH, T4TOTAL, T3FREE, THYROIDAB in the last 72 hours.  Invalid input(s): FREET3 ------------------------------------------------------------------------------------------------------------------ No results for input(s): VITAMINB12, FOLATE, FERRITIN, TIBC, IRON, RETICCTPCT in the last 72 hours.  Coagulation profile  Recent Labs Lab 01/31/17 0837 02/01/17 0409  INR 1.34 1.46    No results for input(s): DDIMER in the last 72 hours.  Cardiac Enzymes No results for input(s): CKMB, TROPONINI, MYOGLOBIN in the last 168 hours.  Invalid input(s): CK ------------------------------------------------------------------------------------------------------------------ Invalid input(s): POCBNP   CBG:  Recent Labs Lab 01/31/17 2150 01/31/17 2220 01/31/17 2334 02/01/17 0520 02/01/17 0729  GLUCAP 62* 82 90 73 79       Studies: Nm Hepatobiliary Including Gb  Result Date: 01/31/2017 CLINICAL DATA:  82 year old male with elevated LFTs, abdominal pain, and nausea and vomiting and  chills. EXAM: NUCLEAR MEDICINE HEPATOBILIARY IMAGING TECHNIQUE: Sequential images of the abdomen were obtained out to 60 minutes following intravenous administration of radiopharmaceutical. RADIOPHARMACEUTICALS:  5.3 mCi Tc-76m Choletec IV COMPARISON:  Abdominal ultrasound dated 01/31/2017 and CT dated 01/31/2017 FINDINGS: There is prompt uptake of the radiotracer by the liver. No activity was seen in the gallbladder during the first 1 hour imaging. No significant activity in the bowel noted. Continued imaging was performed for an additional bladder. No uptake was noted within the gallbladder after a total of 2 hours. Radiotracer was noted within the small-bowel. It was decided to proceed with administration of morphine at this time. However, no morphine was given as the patient's blood pressure was low. IMPRESSION: No activity identified within the gallbladder after 2 hours of imaging. These findings are inconclusive for definite diagnosis of acute cholecystitis as no Morphine could be administered due to patient's low blood pressure. Electronically Signed   By: AAnner CreteM.D.   On: 01/31/2017 21:01   UKoreaAbdomen Complete  Result Date: 01/31/2017 CLINICAL DATA:  Elevated LFTs. EXAM: ABDOMEN ULTRASOUND COMPLETE COMPARISON:  CT 01/31/2017. FINDINGS: Gallbladder: No gallstones noted. Gallbladder wall thickening to 10.9 mm. This can be seen with hyperproteinemia and or cholecystitis. Negative Murphy sign. Common bile duct: Diameter: 6.3 mm Liver: No focal lesion identified. Within normal limits in parenchymal echogenicity. Mild intrahepatic biliary ductal dilatation again noted. IVC: No abnormality visualized. Pancreas: Visualized portion unremarkable. Spleen: Size and appearance within normal limits. Right Kidney: Length: 11.5 cm. Echogenicity within normal limits. No mass or hydronephrosis visualized. Small amount of perinephric fluid noted. Left Kidney: Length: 10.8 cm. Echogenicity within normal limits.  No mass or hydronephrosis visualized. Small amount of perinephric fluid noted. Abdominal aorta: Mild abdominal aortic ectasia 2.9 cm. Other findings: None. IMPRESSION: 1. No gallstones. Gallbladder wall prominent thickening at 10.9 mm. This could be from hyperproteinemia and /or cholecystitis. 2. Mild intrahepatic biliary ductal dilatation again noted. Common bile duct is normal in caliber at 6.3 mm. 3.  Mild amount of perinephric fluid noted. 4. Mild abdominal aortic ectasia 2.9 cm. Ectatic abdominal aorta at risk for aneurysm development. Recommend followup by ultrasound in 5 years. This recommendation follows ACR consensus guidelines: White Paper of the ACR Incidental Findings Committee II on Vascular Findings. J Am Coll Radiol 2013; 10:789-794. Electronically Signed   By: TCoinjock  On: 01/31/2017 10:22   Ct Abdomen Pelvis W Contrast  Result Date: 01/31/2017 CLINICAL DATA:  Shaking, nausea and vomiting today. History of diverticulosis, umbilical hernia. EXAM: CT ABDOMEN AND PELVIS WITH CONTRAST TECHNIQUE: Multidetector CT imaging of the abdomen and pelvis was performed using the standard protocol following bolus administration of intravenous contrast. CONTRAST:  1015mISOVUE-300 IOPAMIDOL (ISOVUE-300) INJECTION 61% COMPARISON:  None. FINDINGS: LOWER CHEST: Bilateral lower lobe atelectasis. The heart is mildly enlarged. Status post median sternotomy for CABG. No pericardial effusions.  HEPATOBILIARY: Mild gallbladder distention and pericholecystic fluid. Slight intrahepatic biliary dilatation. PANCREAS: Normal. SPLEEN: Normal. ADRENALS/URINARY TRACT: Kidneys are orthotopic, demonstrating symmetric enhancement. No nephrolithiasis, hydronephrosis or solid renal masses. The unopacified ureters are normal in course and caliber. Delayed imaging through the kidneys demonstrates symmetric prompt contrast excretion within the proximal urinary collecting system. Urinary bladder is partially distended and  unremarkable. Normal adrenal glands. STOMACH/BOWEL: Severe colonic diverticulosis with rectosigmoid wall thickening and mild pericolonic fat stranding. Focal eccentric wall thickening greater curvature the stomach on axial images corresponds to fold on sagittal series. VASCULAR/LYMPHATIC: Aortoiliac vessels are normal in course and caliber, severe calcific atherosclerosis. Mild porta hepatis lymphadenopathy is likely reactive. Multiple phleboliths in the pelvis. REPRODUCTIVE: Normal. OTHER: No intraperitoneal free fluid or free air. MUSCULOSKELETAL: Nonacute. Grade 1 L5-S1 anterolisthesis on degenerative basis. IMPRESSION: Acute suspected cholecystitis. Mild intrahepatic biliary dilatation. Severe sigmoid diverticulosis and mild acute on chronic sigmoid diverticulitis. These results will be called to the ordering clinician or representative by the Radiologist Assistant, and communication documented in the [facility] Dashboard. Electronically Signed   By: Elon Alas M.D.   On: 01/31/2017 03:09   Dg Chest Port 1 View  Result Date: 01/31/2017 CLINICAL DATA:  82 year old male with fever. EXAM: PORTABLE CHEST 1 VIEW COMPARISON:  Chest radiograph dated 05/05/2011 and 02/20/2016 FINDINGS: There is shallow inspiration. No focal consolidation, pleural effusion, or pneumothorax. There is mild prominence of the central vasculature. The cardiac silhouette is within normal limits. Median sternotomy wires and CABG vascular clips noted. No acute osseous pathology identified. IMPRESSION: No acute cardiopulmonary process.  No focal consolidation. Electronically Signed   By: Anner Crete M.D.   On: 01/31/2017 00:49      Lab Results  Component Value Date   HGBA1C 5.9 11/01/2016   HGBA1C 6.0 07/04/2016   HGBA1C 5.6 02/20/2016   Lab Results  Component Value Date   MICROALBUR 0.7 10/27/2015   LDLCALC 50 07/04/2016   CREATININE 1.18 02/01/2017       Scheduled Meds: . aspirin  325 mg Oral Daily  .  chlorhexidine  15 mL Mouth Rinse BID  . insulin aspart  0-9 Units Subcutaneous Q4H  .  morphine injection  3 mg Intravenous Once  . piperacillin-tazobactam (ZOSYN)  IV  3.375 g Intravenous Q8H   Continuous Infusions: . sodium chloride 75 mL/hr at 02/01/17 0054     LOS: 1 day    Time spent: >30 MINS    [facility]  [facility] Pager [phone]. If 7PM-7AM, please contact night-coverage at [email or web address], password [facility] 02/01/2017, 9:13 AM  LOS: 1 day

## 2017-02-01 NOTE — Plan of Care (Signed)
Problem: Education: Goal: Knowledge of [facility] Education information/materials will improve Outcome: Progressing Needs reinforcement - pt confused at times and forgetful  Problem: Safety: Goal: Ability to remain free from injury will improve Outcome: Progressing Needs reinforcement - pt confused at times and forgetful

## 2017-02-01 NOTE — Progress Notes (Signed)
[facility] Progress Note     Subjective: Pt wants to leave. He states we are not doing anything for him and he is not eating. He states he has not pain and feels fine. No nausea or vomiting.   Objective: Vital signs in last 24 hours: Temp:  [97.5 F (36.4 C)-99 F (37.2 C)] 98.2 F (36.8 C) (03/16 0731) Pulse Rate:  [55-70] 55 (03/16 0731) Resp:  [18] 18 (03/15 1131) BP: (86-121)/(47-63) 121/59 (03/16 0731) SpO2:  [90 %-100 %] 93 % (03/16 0731) Last BM Date: 01/30/17  Intake/Output from previous day: 03/15 0701 - 03/16 0700 In: 2859.6 [I.V.:1409.6; IV Piggyback:1450] Out: 1100 [Urine:1100] Intake/Output this shift: No intake/output data recorded.  PE: Gen:  Alert, NAD, pleasant, cooperative, well appearing Card:  RRR, no M/G/R heard Pulm:  Rate and effort normal Abd: Soft, not distended, +BS, nontender Skin: no rashes noted, warm and dry  Lab Results:   Recent Labs  01/31/17 0837 02/01/17 0409  WBC 20.0* 11.0*  HGB 11.8* 10.5*  HCT 34.8* 31.4*  PLT 131* 130*   BMET  Recent Labs  01/31/17 0837 02/01/17 0409  NA 140 142  K 4.2 4.1  CL 109 110  CO2 22 23  GLUCOSE 112* 80  BUN 15 12  CREATININE 1.29* 1.18  CALCIUM 7.7* 7.3*   PT/INR  Recent Labs  01/31/17 0837 02/01/17 0409  LABPROT 16.6* 17.9*  INR 1.34 1.46   CMP     Component Value Date/Time   NA 142 02/01/2017 0409   K 4.1 02/01/2017 0409   CL 110 02/01/2017 0409   CO2 23 02/01/2017 0409   GLUCOSE 80 02/01/2017 0409   BUN 12 02/01/2017 0409   CREATININE 1.18 02/01/2017 0409   CALCIUM 7.3 (L) 02/01/2017 0409   PROT 4.8 (L) 02/01/2017 0409   ALBUMIN 2.6 (L) 02/01/2017 0409   AST 172 (H) 02/01/2017 0409   ALT 197 (H) 02/01/2017 0409   ALKPHOS 111 02/01/2017 0409   BILITOT 2.5 (H) 02/01/2017 0409   GFRNONAA 56 (L) 02/01/2017 0409   GFRAA >60 02/01/2017 0409   Lipase  No results found for: LIPASE     Studies/Results: Nm Hepatobiliary Including Gb  Result Date:  01/31/2017 CLINICAL DATA:  82 year old male with elevated LFTs, abdominal pain, and nausea and vomiting and chills. EXAM: NUCLEAR MEDICINE HEPATOBILIARY IMAGING TECHNIQUE: Sequential images of the abdomen were obtained out to 60 minutes following intravenous administration of radiopharmaceutical. RADIOPHARMACEUTICALS:  5.3 mCi Tc-29m Choletec IV COMPARISON:  Abdominal ultrasound dated 01/31/2017 and CT dated 01/31/2017 FINDINGS: There is prompt uptake of the radiotracer by the liver. No activity was seen in the gallbladder during the first 1 hour imaging. No significant activity in the bowel noted. Continued imaging was performed for an additional bladder. No uptake was noted within the gallbladder after a total of 2 hours. Radiotracer was noted within the small-bowel. It was decided to proceed with administration of morphine at this time. However, no morphine was given as the patient's blood pressure was low. IMPRESSION: No activity identified within the gallbladder after 2 hours of imaging. These findings are inconclusive for definite diagnosis of acute cholecystitis as no Morphine could be administered due to patient's low blood pressure. Electronically Signed   By: AAnner CreteM.D.   On: 01/31/2017 21:01   [place]oreaAbdomen Complete  Result Date: 01/31/2017 CLINICAL DATA:  Elevated LFTs. EXAM: ABDOMEN ULTRASOUND COMPLETE COMPARISON:  CT 01/31/2017. FINDINGS: Gallbladder: No gallstones noted. Gallbladder wall thickening to 10.9  mm. This can be seen with hyperproteinemia and or cholecystitis. Negative Murphy sign. Common bile duct: Diameter: 6.3 mm Liver: No focal lesion identified. Within normal limits in parenchymal echogenicity. Mild intrahepatic biliary ductal dilatation again noted. IVC: No abnormality visualized. Pancreas: Visualized portion unremarkable. Spleen: Size and appearance within normal limits. Right Kidney: Length: 11.5 cm. Echogenicity within normal limits. No mass or hydronephrosis  visualized. Small amount of perinephric fluid noted. Left Kidney: Length: 10.8 cm. Echogenicity within normal limits. No mass or hydronephrosis visualized. Small amount of perinephric fluid noted. Abdominal aorta: Mild abdominal aortic ectasia 2.9 cm. Other findings: None. IMPRESSION: 1. No gallstones. Gallbladder wall prominent thickening at 10.9 mm. This could be from hyperproteinemia and /or cholecystitis. 2. Mild intrahepatic biliary ductal dilatation again noted. Common bile duct is normal in caliber at 6.3 mm. 3.  Mild amount of perinephric fluid noted. 4. Mild abdominal aortic ectasia 2.9 cm. Ectatic abdominal aorta at risk for aneurysm development. Recommend followup by ultrasound in 5 years. This recommendation follows ACR consensus guidelines: White Paper of the ACR Incidental Findings Committee II on Vascular Findings. J Am Coll Radiol 2013; 10:789-794. Electronically Signed   By: Barnard   On: 01/31/2017 10:22   Ct Abdomen Pelvis W Contrast  Result Date: 01/31/2017 CLINICAL DATA:  Shaking, nausea and vomiting today. History of diverticulosis, umbilical hernia. EXAM: CT ABDOMEN AND PELVIS WITH CONTRAST TECHNIQUE: Multidetector CT imaging of the abdomen and pelvis was performed using the standard protocol following bolus administration of intravenous contrast. CONTRAST:  13m ISOVUE-300 IOPAMIDOL (ISOVUE-300) INJECTION 61% COMPARISON:  None. FINDINGS: LOWER CHEST: Bilateral lower lobe atelectasis. The heart is mildly enlarged. Status post median sternotomy for CABG. No pericardial effusions. HEPATOBILIARY: Mild gallbladder distention and pericholecystic fluid. Slight intrahepatic biliary dilatation. PANCREAS: Normal. SPLEEN: Normal. ADRENALS/URINARY TRACT: Kidneys are orthotopic, demonstrating symmetric enhancement. No nephrolithiasis, hydronephrosis or solid renal masses. The unopacified ureters are normal in course and caliber. Delayed imaging through the kidneys demonstrates symmetric  prompt contrast excretion within the proximal urinary collecting system. Urinary bladder is partially distended and unremarkable. Normal adrenal glands. STOMACH/BOWEL: Severe colonic diverticulosis with rectosigmoid wall thickening and mild pericolonic fat stranding. Focal eccentric wall thickening greater curvature the stomach on axial images corresponds to fold on sagittal series. VASCULAR/LYMPHATIC: Aortoiliac vessels are normal in course and caliber, severe calcific atherosclerosis. Mild porta hepatis lymphadenopathy is likely reactive. Multiple phleboliths in the pelvis. REPRODUCTIVE: Normal. OTHER: No intraperitoneal free fluid or free air. MUSCULOSKELETAL: Nonacute. Grade 1 L5-S1 anterolisthesis on degenerative basis. IMPRESSION: Acute suspected cholecystitis. Mild intrahepatic biliary dilatation. Severe sigmoid diverticulosis and mild acute on chronic sigmoid diverticulitis. These results will be called to the ordering clinician or representative by the Radiologist Assistant, and communication documented in the [facility] Dashboard. Electronically Signed   By: CElon AlasM.D.   On: 01/31/2017 03:09   Dg Chest Port 1 View  Result Date: 01/31/2017 CLINICAL DATA:  82year old male with fever. EXAM: PORTABLE CHEST 1 VIEW COMPARISON:  Chest radiograph dated 05/05/2011 and 02/20/2016 FINDINGS: There is shallow inspiration. No focal consolidation, pleural effusion, or pneumothorax. There is mild prominence of the central vasculature. The cardiac silhouette is within normal limits. Median sternotomy wires and CABG vascular clips noted. No acute osseous pathology identified. IMPRESSION: No acute cardiopulmonary process.  No focal consolidation. Electronically Signed   By: AAnner CreteM.D.   On: 01/31/2017 00:49    Anti-infectives: Anti-infectives    Start     Dose/Rate Route Frequency Ordered Stop   01/31/17  2045  piperacillin-tazobactam (ZOSYN) IVPB 3.375 g     3.375 g 12.5 mL/hr over 240  Minutes Intravenous Every 8 hours 01/31/17 2033     01/31/17 1300  ciprofloxacin (CIPRO) IVPB 400 mg  Status:  Discontinued     400 mg 200 mL/hr over 60 Minutes Intravenous Every 12 hours 01/31/17 1144 01/31/17 2014   01/31/17 1200  vancomycin (VANCOCIN) IVPB 750 mg/150 ml premix  Status:  Discontinued     750 mg 150 mL/hr over 60 Minutes Intravenous Every 12 hours 01/31/17 0218 01/31/17 1144   01/31/17 0900  metroNIDAZOLE (FLAGYL) IVPB 500 mg  Status:  Discontinued     500 mg 100 mL/hr over 60 Minutes Intravenous Every 8 hours 01/31/17 0811 01/31/17 2014   01/31/17 0600  piperacillin-tazobactam (ZOSYN) IVPB 3.375 g  Status:  Discontinued     3.375 g 12.5 mL/hr over 240 Minutes Intravenous Every 8 hours 01/31/17 0218 01/31/17 1144   01/30/17 2315  piperacillin-tazobactam (ZOSYN) IVPB 3.375 g     3.375 g 100 mL/hr over 30 Minutes Intravenous  Once 01/30/17 2313 01/31/17 0050   01/30/17 2315  vancomycin (VANCOCIN) IVPB 1000 mg/200 mL premix     1,000 mg 200 mL/hr over 60 Minutes Intravenous  Once 01/30/17 2313 01/31/17 0150       Assessment/Plan Active Problems:   Essential hypertension   Coronary atherosclerosis   Diverticulosis of colon   BPH (benign prostatic hyperplasia)   CORONARY ARTERY BYPASS GRAFT, THREE VESSEL, HX OF   Memory loss   Diabetes mellitus, type 2 (HCC)   Sepsis, unspecified organism (HCC)   Acute cholecystitis   Diverticulitis of colon  Diverticulitis, acute on chronic - Zosyn CAD s/p CABG - holding ASA DM2 dementia  Sepsis in setting of mild diverticulitis with acute cholecystitis - blood cultures show Enterobacter and Klebsiella - HIDA showed no uptake into gallbladder so this could be a acalculous cholecystitis. Will ask IR to evaluate for drain placement. Appreciate their input. - GI is following  - LFT's, bilirubin, and leukocytosis trending down - lactic WNL  - Continue antibiotics per primary  We will continue to follow. No surgery at this  time. Hope that perc drain and abx can resolve this without need for surgical intervention.   LOS: 1 day    Kalman Drape , [facility] 02/01/2017, 7:49 AM Pager: [phone] Consults: [phone] Mon-Fri 7:00 am-4:30 pm Sat-Sun 7:00 am-11:30 am

## 2017-02-01 NOTE — Progress Notes (Signed)
Patient has been SB as low as 47 and sustaining. Still hypotensive 90/42 (56) after multiple boluses and antibx. Text page sent to MD, waiting for further instruction. Will continue to monitor patient.

## 2017-02-01 NOTE — Care Management Note (Signed)
Case Management Note  Patient Details  Name: Ricardo Washington MRN: [id number] Date of Birth: 06/14/1935  Subjective/Objective:      Pt admitted on 01/30/17 with sepsis in setting of mild diverticulitis, possible acute cholecystitis.  PTA, pt independent, lives with spouse Hyda scan is positive, gall bladder not draining,  IR to see for drain, per IR will hold off for now, he may cont to improve with abx txt.  NCM will cont to follow for dc needs.             Action/Plan:   Expected Discharge Date:                  Expected Discharge Plan:  Home/Self Care  In-House Referral:     Discharge planning Services  CM Consult  Post Acute Care Choice:    Choice offered to:     DME Arranged:    DME Agency:     HH Arranged:    HH Agency:     Status of Service:  In process, will continue to follow  If discussed at Long Length of Stay Meetings, dates discussed:    Additional Comments:  Zenon Mayo, RN 02/01/2017, 3:04 PM

## 2017-02-01 NOTE — Progress Notes (Signed)
Patient ID: Ricardo Washington, male   DOB: 1935-09-09, 82 y.o.   MRN: [id number]   Request for percutaneous cholecystostomy drain placement Per Dr Donne Hazel.  Chart review: Wbc 11 down from 20 on admission 3/14 afeb Denies abdominal pain per notes in chart  [place]:IMPRESSION: 1. No gallstones. Gallbladder wall prominent thickening at 10.9 mm. This could be from hyperproteinemia and /or cholecystitis. 2. Mild intrahepatic biliary ductal dilatation again noted. Common bile duct is normal in caliber at 6.3 mm.  HIDA:IMPRESSION: No activity identified within the gallbladder after 2 hours of imaging. These findings are inconclusive for definite diagnosis of acute cholecystitis as no Morphine could be administered due to patient's low blood pressure.   Discussed case with Dr Laurence Ferrari He spoke to [facility] with [facility] With inconclusive HIDA; pt better daily; afeb; wbc down;denies abd pain--- Could consider chole drain for acalculous cholecystitis and follow in drain clinic with Drain removal after injection to prove bile duct patency Or Consider holding off for now; if pt continues to improve with antibiotic treatment may not need drain at all.  Janett Billow discussed with Dr Donne Hazel Will HOLD on drain for now.  Please let us know if need IR---  if symptoms change

## 2017-02-01 NOTE — Progress Notes (Signed)
   Patient Name: Ricardo Washington Date of Encounter: 02/01/2017, 10:15 AM    Subjective  HIDA + for cholecystitis Perc cholecystostomy planned Still w/o pain and wants to go home   Objective  BP (!) 121/59 (BP Location: Right Arm)   Pulse (!) 55   Temp 98.2 F (36.8 C) (Oral)   Resp 18   Ht 5' 6"  (1.676 m)   Wt 164 lb 7.4 oz (74.6 kg)   SpO2 93%   BMI 26.55 kg/m  NAD    Assessment and Plan  Acalculous cholecystitis  Agree w/ percutaneous drainage and Abx  Signing off Call us back if needed  I did explain things to his wife re: Tx plans and why non-essential meds have been held - he wanted her to bring him his brain pill (Aricept)  Gatha Mayer, MD, [facility] [phone] (pager) [phone] after 5 PM, weekends and holidays  02/01/2017 10:15 AM

## 2017-02-02 LAB — CBC
HEMATOCRIT: 33.3 % — AB (ref 39.0–52.0)
Hemoglobin: 11.4 g/dL — ABNORMAL LOW (ref 13.0–17.0)
MCH: 34.3 pg — ABNORMAL HIGH (ref 26.0–34.0)
MCHC: 34.2 g/dL (ref 30.0–36.0)
MCV: 100.3 fL — ABNORMAL HIGH (ref 78.0–100.0)
Platelets: 112 10*3/uL — ABNORMAL LOW (ref 150–400)
RBC: 3.32 MIL/uL — ABNORMAL LOW (ref 4.22–5.81)
RDW: 15.4 % (ref 11.5–15.5)
WBC: 8.6 10*3/uL (ref 4.0–10.5)

## 2017-02-02 LAB — COMPREHENSIVE METABOLIC PANEL
ALBUMIN: 2.6 g/dL — AB (ref 3.5–5.0)
ALT: 135 U/L — ABNORMAL HIGH (ref 17–63)
AST: 76 U/L — AB (ref 15–41)
Alkaline Phosphatase: 98 U/L (ref 38–126)
Anion gap: 8 (ref 5–15)
BUN: 7 mg/dL (ref 6–20)
CALCIUM: 7.3 mg/dL — AB (ref 8.9–10.3)
CO2: 20 mmol/L — ABNORMAL LOW (ref 22–32)
CREATININE: 1.04 mg/dL (ref 0.61–1.24)
Chloride: 113 mmol/L — ABNORMAL HIGH (ref 101–111)
Glucose, Bld: 94 mg/dL (ref 65–99)
Potassium: 3.5 mmol/L (ref 3.5–5.1)
Sodium: 141 mmol/L (ref 135–145)
TOTAL PROTEIN: 4.9 g/dL — AB (ref 6.5–8.1)
Total Bilirubin: 1 mg/dL (ref 0.3–1.2)

## 2017-02-02 LAB — CULTURE, BLOOD (ROUTINE X 2)

## 2017-02-02 LAB — GLUCOSE, CAPILLARY
GLUCOSE-CAPILLARY: 96 mg/dL (ref 65–99)
Glucose-Capillary: 92 mg/dL (ref 65–99)
Glucose-Capillary: 117 mg/dL — ABNORMAL HIGH (ref 65–99)
Glucose-Capillary: 119 mg/dL — ABNORMAL HIGH (ref 65–99)
Glucose-Capillary: 93 mg/dL (ref 65–99)
Glucose-Capillary: 109 mg/dL — ABNORMAL HIGH (ref 65–99)

## 2017-02-02 MED ORDER — DEXTROSE-NACL 5-0.9 % IV SOLN
INTRAVENOUS | Status: DC
  Administered 2017-02-02 (×2): via INTRAVENOUS

## 2017-02-02 NOTE — Progress Notes (Signed)
Subjective: Patient is asymptomatic Tolerating clear liquids NO BM yesterday  Objective: Vital signs in last 24 hours: Temp:  [97.9 F (36.6 C)-99 F (37.2 C)] 98.1 F (36.7 C) (03/17 1122) Pulse Rate:  [49-58] 51 (03/17 1122) Resp:  [15-18] 16 (03/17 1122) BP: (114-130)/(56-76) 114/56 (03/17 1122) SpO2:  [91 %-96 %] 96 % (03/17 1122) Last BM Date: 01/31/17  Intake/Output from previous day: 03/16 0701 - 03/17 0700 In: 2552.1 [I.V.:2402.1; IV Piggyback:150] Out: 1075 [Urine:1075] Intake/Output this shift: Total I/O In: -  Out: 650 [Urine:650]  General appearance: alert, cooperative and no distress GI: soft, non-tender; bowel sounds normal; no masses,  no organomegaly Skin - no jaundice  Lab Results:   Recent Labs  02/01/17 0409 02/02/17 0300  WBC 11.0* 8.6  HGB 10.5* 11.4*  HCT 31.4* 33.3*  PLT 130* 112*   BMET  Recent Labs  02/01/17 0409 02/02/17 0300  NA 142 141  K 4.1 3.5  CL 110 113*  CO2 23 20*  GLUCOSE 80 94  BUN 12 7  CREATININE 1.18 1.04  CALCIUM 7.3* 7.3*   Hepatic Function Latest Ref Rng & Units 02/02/2017 02/01/2017 01/31/2017  Total Protein 6.5 - 8.1 g/dL 4.9(L) 4.8(L) 5.7(L)  Albumin 3.5 - 5.0 g/dL 2.6(L) 2.6(L) 3.1(L)  AST 15 - 41 U/L 76(H) 172(H) 528(H)  ALT 17 - 63 U/L 135(H) 197(H) 354(H)  Alk Phosphatase 38 - 126 U/L 98 111 127(H)  Total Bilirubin 0.3 - 1.2 mg/dL 1.0 2.5(H) 3.2(H)  Bilirubin, Direct 0.0 - 0.3 mg/dL - - -    PT/INR  Recent Labs  01/31/17 0837 02/01/17 0409  LABPROT 16.6* 17.9*  INR 1.34 1.46   ABG No results for input(s): PHART, HCO3 in the last 72 hours.  Invalid input(s): PCO2, PO2  Studies/Results: Nm Hepatobiliary Including Gb  Result Date: 01/31/2017 CLINICAL DATA:  82 year old male with elevated LFTs, abdominal pain, and nausea and vomiting and chills. EXAM: NUCLEAR MEDICINE HEPATOBILIARY IMAGING TECHNIQUE: Sequential images of the abdomen were obtained out to 60 minutes following intravenous  administration of radiopharmaceutical. RADIOPHARMACEUTICALS:  5.3 mCi Tc-34m Choletec IV COMPARISON:  Abdominal ultrasound dated 01/31/2017 and CT dated 01/31/2017 FINDINGS: There is prompt uptake of the radiotracer by the liver. No activity was seen in the gallbladder during the first 1 hour imaging. No significant activity in the bowel noted. Continued imaging was performed for an additional bladder. No uptake was noted within the gallbladder after a total of 2 hours. Radiotracer was noted within the small-bowel. It was decided to proceed with administration of morphine at this time. However, no morphine was given as the patient's blood pressure was low. IMPRESSION: No activity identified within the gallbladder after 2 hours of imaging. These findings are inconclusive for definite diagnosis of acute cholecystitis as no Morphine could be administered due to patient's low blood pressure. Electronically Signed   By: AAnner CreteM.D.   On: 01/31/2017 21:01    Anti-infectives: Anti-infectives    Start     Dose/Rate Route Frequency Ordered Stop   01/31/17 2045  piperacillin-tazobactam (ZOSYN) IVPB 3.375 g     3.375 g 12.5 mL/hr over 240 Minutes Intravenous Every 8 hours 01/31/17 2033     01/31/17 1300  ciprofloxacin (CIPRO) IVPB 400 mg  Status:  Discontinued     400 mg 200 mL/hr over 60 Minutes Intravenous Every 12 hours 01/31/17 1144 01/31/17 2014   01/31/17 1200  vancomycin (VANCOCIN) IVPB 750 mg/150 ml premix  Status:  Discontinued  750 mg 150 mL/hr over 60 Minutes Intravenous Every 12 hours 01/31/17 0218 01/31/17 1144   01/31/17 0900  metroNIDAZOLE (FLAGYL) IVPB 500 mg  Status:  Discontinued     500 mg 100 mL/hr over 60 Minutes Intravenous Every 8 hours 01/31/17 0811 01/31/17 2014   01/31/17 0600  piperacillin-tazobactam (ZOSYN) IVPB 3.375 g  Status:  Discontinued     3.375 g 12.5 mL/hr over 240 Minutes Intravenous Every 8 hours 01/31/17 0218 01/31/17 1144   01/30/17 2315   piperacillin-tazobactam (ZOSYN) IVPB 3.375 g     3.375 g 100 mL/hr over 30 Minutes Intravenous  Once 01/30/17 2313 01/31/17 0050   01/30/17 2315  vancomycin (VANCOCIN) IVPB 1000 mg/200 mL premix     1,000 mg 200 mL/hr over 60 Minutes Intravenous  Once 01/30/17 2313 01/31/17 0150      Assessment/Plan: Diverticulitis, acute on chronic - Zosyn CAD s/p CABG - holding ASA DM2 Dementia Possible acalculous cholecystitis, but patient is asymptomatic Liver function test/ WBC improving  Sepsis in setting of mild diverticulitis with acute cholecystitis - blood cultures show Enterobacter and Klebsiella - HIDA showed no uptake into gallbladder so this could be a acalculous cholecystitis. No indications for surgery since patient is asymptomatic and labs improving - GI is following  - LFT's, bilirubin, and leukocytosis trending down - lactic WNL  - Continue antibiotics per primary  We will continue to follow. No surgery at this time.  LOS: 2 days    Burnis Kaser K. 02/02/2017

## 2017-02-02 NOTE — Plan of Care (Signed)
Problem: Safety: Goal: Ability to remain free from injury will improve Outcome: Not Progressing Patient still attempting to get out of bed without staff present.

## 2017-02-02 NOTE — Progress Notes (Signed)
Pt educated about safety and importance of bed alarm during the night however pt refuses to be on bed alarm. Will continue to round on patient.   Katai Marsico, RN    

## 2017-02-02 NOTE — Progress Notes (Signed)
[facility] PROGRESS NOTE  [name] TGG:[id number] DOB: 09-23-1935 DOA: 01/30/2017   PCP: [name], MD     Assessment/Plan: Active Problems:   Essential hypertension   Coronary atherosclerosis   Diverticulosis of colon   BPH (benign prostatic hyperplasia)   CORONARY ARTERY BYPASS GRAFT, THREE VESSEL, HX OF   Memory loss   Diabetes mellitus, type 2 (HCC)   Sepsis, unspecified organism ([place])   Acute cholecystitis   Diverticulitis of colon   Acalculous cholecystitis   HPI: Ricardo Washington is a generally healthy, functional 82 y.o. male.  PMH early dementia.  BPH. HTN. Severe, colonic, pan diverticulosis.  Adenomatous colon polyps.  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  Gout.  DM 2, well contolled on oral agents presented with Acute onset non-bloody N/V; rigors, chills. Temp 101.2 in ED.  WBCs 20 K.   Liver function tests found to be grossly of normal T bili 2.2 >> 3.2.  Alk phos 175 >> 127.  AST/ALT 937/528 >> 409/354.   CT ab/pelvis: suspect acute cholecystitis.  "Slight" intrahepatic ductal dilatation.  Mild porta hepatis lymphadenopathy felt to be reactive.  Severe sigmoid diverticulosis with mild acute on chronic diverticulitis.   Ultrasound:  38m thick GB wall without stones/sludge.  Mild intrahepatic biliary ductal dilatation.  6.3 MM CBD.  Mild, bil, perinephric fluid. Ectatic, 2.9 cm abdominal aorta.   Assessment and plan Transaminitis/Acalculous cholecystitis HIDA + for cholecystitis Imaging suggest cholecystitis and sigmoid diverticulitis.  ? Acalculous cholecystitis? Wonder about passed CBD stone?.Marland Kitchen Abx given include Zosyn, vanc: x 1 dose.  Now on IV Zosyn, day #3 Blood culture positive for Klebsiella pneumonia, sensitivity pending HIDA showed no uptake into gallbladder so this could be a acalculous cholecystitis IR holding off on the drain for now after discussing with CCS, as the patient is slowly improving Started on clear liquids    Hx CAD s/p CABG -will  hold ASA for now and await surgery/GI recs/IR  recommendations  Hx DM2 -hold home meds given above issues -SSI for now  Hx hypertension -low BP on admit. Will hold home BB for now  Hx dementia -mild -resume med with po's  Hx BPH -resume meds with po's   DVT prophylaxsis SCDs  Code Status:  Full code     Family Communication: Discussed in detail with the patient, all imaging results, lab results explained to the patient   Disposition Plan:   Continue antibiotics for acalculous cholecystitis      Consultants:  Surgery  GI  IR  Procedures:  None  Antibiotics: Anti-infectives    Start     Dose/Rate Route Frequency Ordered Stop   01/31/17 2045  piperacillin-tazobactam (ZOSYN) IVPB 3.375 g     3.375 g 12.5 mL/hr over 240 Minutes Intravenous Every 8 hours 01/30/17 2033         HPI/Subjective: Denies any nausea,vomiting,tolerating clears  Objective: Vitals:   02/01/17 1938 02/01/17 2321 02/02/17 0357 02/02/17 0733  BP: 124/70 119/60 127/65 130/76  Pulse: (!) 58 (!) 54 (!) 55 (!) 55  Resp: 18 15  17   Temp: 98.2 F (36.8 C) 98.3 F (36.8 C) 98.6 F (37 C) 99 F (37.2 C)  TempSrc: Oral Oral Oral Oral  SpO2: 95% 91% 95% 93%  Weight:      Height:        Intake/Output Summary (Last 24 hours) at 02/02/17 0808 Last data filed at 02/02/17 0736  Gross per 24 hour  Intake  2552.08 ml  Output             1275 ml  Net          1277.08 ml    Exam:  Examination:  General exam: Appears calm and comfortable  Respiratory system: Clear to auscultation. Respiratory effort normal. Cardiovascular system: S1 & S2 heard, RRR. No JVD, murmurs, rubs, gallops or clicks. No pedal edema. Gastrointestinal system: Abdomen is nondistended, soft and nontender. No organomegaly or masses felt. Normal bowel sounds heard. Central nervous system: Alert and oriented. No focal neurological deficits. Extremities: Symmetric 5 x 5 power. Skin: No rashes, lesions or  ulcers Psychiatry: Judgement and insight appear normal. Mood & affect appropriate.     Data Reviewed: I have personally reviewed following labs and imaging studies  Micro Results Recent Results (from the past 240 hour(s))  Blood culture (routine x 2)     Status: Abnormal (Preliminary result)   Collection Time: 01/30/17 11:40 PM  Result Value Ref Range Status   Specimen Description BLOOD RIGHT ANTECUBITAL  Final   Special Requests   Final    BOTTLES DRAWN AEROBIC AND ANAEROBIC AER 5cc ANA 5cc   Culture  Setup Time   Final    GRAM NEGATIVE RODS AEROBIC BOTTLE ONLY CRITICAL RESULT CALLED TO, READ BACK BY AND VERIFIED WITH: Mordecai Rasmussen RN 1946 01/31/17 A BROWNING    Culture (A)  Final    KLEBSIELLA PNEUMONIAE SUSCEPTIBILITIES TO FOLLOW Performed at [facility], [place] [address] [place], [place] [address]    Report Status PENDING  Incomplete  Blood Culture ID Panel (Reflexed)     Status: Abnormal   Collection Time: 01/30/17 11:40 PM  Result Value Ref Range Status   Enterococcus species NOT DETECTED NOT DETECTED Final   Listeria monocytogenes NOT DETECTED NOT DETECTED Final   Staphylococcus species NOT DETECTED NOT DETECTED Final   Staphylococcus aureus NOT DETECTED NOT DETECTED Final   Streptococcus species NOT DETECTED NOT DETECTED Final   Streptococcus agalactiae NOT DETECTED NOT DETECTED Final   Streptococcus pneumoniae NOT DETECTED NOT DETECTED Final   Streptococcus pyogenes NOT DETECTED NOT DETECTED Final   Acinetobacter baumannii NOT DETECTED NOT DETECTED Final   Enterobacteriaceae species DETECTED (A) NOT DETECTED Final    Comment: Enterobacteriaceae represent a large family of gram-negative bacteria, not a single organism. CRITICAL RESULT CALLED TO, READ BACK BY AND VERIFIED WITH: Mordecai Rasmussen RN [id number] 01/31/17 A BROWNING    Enterobacter cloacae complex NOT DETECTED NOT DETECTED Final   Escherichia coli NOT DETECTED NOT DETECTED Final   Klebsiella oxytoca NOT DETECTED NOT  DETECTED Final   Klebsiella pneumoniae DETECTED (A) NOT DETECTED Final    Comment: CRITICAL RESULT CALLED TO, READ BACK BY AND VERIFIED WITH: Mordecai Rasmussen RN 1946 01/31/17 A BROWNING    Proteus species NOT DETECTED NOT DETECTED Final   Serratia marcescens NOT DETECTED NOT DETECTED Final   Carbapenem resistance NOT DETECTED NOT DETECTED Final   Haemophilus influenzae NOT DETECTED NOT DETECTED Final   Neisseria meningitidis NOT DETECTED NOT DETECTED Final   Pseudomonas aeruginosa NOT DETECTED NOT DETECTED Final   Candida albicans NOT DETECTED NOT DETECTED Final   Candida glabrata NOT DETECTED NOT DETECTED Final   Candida krusei NOT DETECTED NOT DETECTED Final   Candida parapsilosis NOT DETECTED NOT DETECTED Final   Candida tropicalis NOT DETECTED NOT DETECTED Final    Comment: Performed at [facility], [place] [address], [place],  [address]  MRSA PCR Screening  Status: None   Collection Time: 01/31/17  7:36 AM  Result Value Ref Range Status   MRSA by PCR NEGATIVE NEGATIVE Final    Comment:        The GeneXpert MRSA Assay (FDA approved for NASAL specimens only), is one component of a comprehensive MRSA colonization surveillance program. It is not intended to diagnose MRSA infection nor to guide or monitor treatment for MRSA infections.   Urine culture     Status: Abnormal   Collection Time: 01/31/17  8:57 AM  Result Value Ref Range Status   Specimen Description URINE, RANDOM  Final   Special Requests NONE  Final   Culture <10,000 COLONIES/mL >=100,000 COLONIES/mL (A)  Final   Report Status 02/01/2017 FINAL  Final    Radiology Reports Nm Hepatobiliary Including Gb  Result Date: 01/31/2017 CLINICAL DATA:  82 year old male with elevated LFTs, abdominal pain, and nausea and vomiting and chills. EXAM: NUCLEAR MEDICINE HEPATOBILIARY IMAGING TECHNIQUE: Sequential images of the abdomen were obtained out to 60 minutes following intravenous administration of  radiopharmaceutical. RADIOPHARMACEUTICALS:  5.3 mCi Tc-37m Choletec IV COMPARISON:  Abdominal ultrasound dated 01/31/2017 and CT dated 01/31/2017 FINDINGS: There is prompt uptake of the radiotracer by the liver. No activity was seen in the gallbladder during the first 1 hour imaging. No significant activity in the bowel noted. Continued imaging was performed for an additional bladder. No uptake was noted within the gallbladder after a total of 2 hours. Radiotracer was noted within the small-bowel. It was decided to proceed with administration of morphine at this time. However, no morphine was given as the patient's blood pressure was low. IMPRESSION: No activity identified within the gallbladder after 2 hours of imaging. These findings are inconclusive for definite diagnosis of acute cholecystitis as no Morphine could be administered due to patient's low blood pressure. Electronically Signed   By: AAnner CreteM.D.   On: 01/31/2017 21:01   UKoreaAbdomen Complete  Result Date: 01/31/2017 CLINICAL DATA:  Elevated LFTs. EXAM: ABDOMEN ULTRASOUND COMPLETE COMPARISON:  CT 01/31/2017. FINDINGS: Gallbladder: No gallstones noted. Gallbladder wall thickening to 10.9 mm. This can be seen with hyperproteinemia and or cholecystitis. Negative Murphy sign. Common bile duct: Diameter: 6.3 mm Liver: No focal lesion identified. Within normal limits in parenchymal echogenicity. Mild intrahepatic biliary ductal dilatation again noted. IVC: No abnormality visualized. Pancreas: Visualized portion unremarkable. Spleen: Size and appearance within normal limits. Right Kidney: Length: 11.5 cm. Echogenicity within normal limits. No mass or hydronephrosis visualized. Small amount of perinephric fluid noted. Left Kidney: Length: 10.8 cm. Echogenicity within normal limits. No mass or hydronephrosis visualized. Small amount of perinephric fluid noted. Abdominal aorta: Mild abdominal aortic ectasia 2.9 cm. Other findings: None. IMPRESSION:  1. No gallstones. Gallbladder wall prominent thickening at 10.9 mm. This could be from hyperproteinemia and /or cholecystitis. 2. Mild intrahepatic biliary ductal dilatation again noted. Common bile duct is normal in caliber at 6.3 mm. 3.  Mild amount of perinephric fluid noted. 4. Mild abdominal aortic ectasia 2.9 cm. Ectatic abdominal aorta at risk for aneurysm development. Recommend followup by ultrasound in 5 years. This recommendation follows ACR consensus guidelines: White Paper of the ACR Incidental Findings Committee II on Vascular Findings. J Am Coll Radiol 2013; 10:789-794. Electronically Signed   By: TFinger  On: 01/31/2017 10:22   Ct Abdomen Pelvis W Contrast  Result Date: 01/31/2017 CLINICAL DATA:  Shaking, nausea and vomiting today. History of diverticulosis, umbilical hernia. EXAM: CT ABDOMEN AND PELVIS WITH CONTRAST  TECHNIQUE: Multidetector CT imaging of the abdomen and pelvis was performed using the standard protocol following bolus administration of intravenous contrast. CONTRAST:  169m ISOVUE-300 IOPAMIDOL (ISOVUE-300) INJECTION 61% COMPARISON:  None. FINDINGS: LOWER CHEST: Bilateral lower lobe atelectasis. The heart is mildly enlarged. Status post median sternotomy for CABG. No pericardial effusions. HEPATOBILIARY: Mild gallbladder distention and pericholecystic fluid. Slight intrahepatic biliary dilatation. PANCREAS: Normal. SPLEEN: Normal. ADRENALS/URINARY TRACT: Kidneys are orthotopic, demonstrating symmetric enhancement. No nephrolithiasis, hydronephrosis or solid renal masses. The unopacified ureters are normal in course and caliber. Delayed imaging through the kidneys demonstrates symmetric prompt contrast excretion within the proximal urinary collecting system. Urinary bladder is partially distended and unremarkable. Normal adrenal glands. STOMACH/BOWEL: Severe colonic diverticulosis with rectosigmoid wall thickening and mild pericolonic fat stranding. Focal eccentric wall  thickening greater curvature the stomach on axial images corresponds to fold on sagittal series. VASCULAR/LYMPHATIC: Aortoiliac vessels are normal in course and caliber, severe calcific atherosclerosis. Mild porta hepatis lymphadenopathy is likely reactive. Multiple phleboliths in the pelvis. REPRODUCTIVE: Normal. OTHER: No intraperitoneal free fluid or free air. MUSCULOSKELETAL: Nonacute. Grade 1 L5-S1 anterolisthesis on degenerative basis. IMPRESSION: Acute suspected cholecystitis. Mild intrahepatic biliary dilatation. Severe sigmoid diverticulosis and mild acute on chronic sigmoid diverticulitis. These results will be called to the ordering clinician or representative by the Radiologist Assistant, and communication documented in the [facility] Dashboard. Electronically Signed   By: CElon AlasM.D.   On: 01/31/2017 03:09   Dg Chest Port 1 View  Result Date: 01/31/2017 CLINICAL DATA:  82year old male with fever. EXAM: PORTABLE CHEST 1 VIEW COMPARISON:  Chest radiograph dated 05/05/2011 and 02/20/2016 FINDINGS: There is shallow inspiration. No focal consolidation, pleural effusion, or pneumothorax. There is mild prominence of the central vasculature. The cardiac silhouette is within normal limits. Median sternotomy wires and CABG vascular clips noted. No acute osseous pathology identified. IMPRESSION: No acute cardiopulmonary process.  No focal consolidation. Electronically Signed   By: AAnner CreteM.D.   On: 01/31/2017 00:49     CBC  Recent Labs Lab 01/30/17 2235 01/31/17 0837 02/01/17 0409 02/02/17 0300  WBC 10.2 20.0* 11.0* 8.6  HGB 13.0 11.8* 10.5* 11.4*  HCT 38.5* 34.8* 31.4* 33.3*  PLT 187 131* 130* 112*  MCV 101.6* 101.2* 101.9* 100.3*  MCH 34.3* 34.3* 34.1* 34.3*  MCHC 33.8 33.9 33.4 34.2  RDW 14.3 14.8 15.5 15.4  LYMPHSABS 0.4* 1.2  --   --   MONOABS 0.2 1.2*  --   --   EOSABS 0.0 0.0  --   --   BASOSABS 0.0 0.0  --   --     Chemistries   Recent Labs Lab  01/30/17 2335 01/31/17 0837 02/01/17 0409 02/02/17 0300  NA 139 140 142 141  K 3.8 4.2 4.1 3.5  CL 103 109 110 113*  CO2 25 22 23  20*  GLUCOSE 162* 112* 80 94  BUN 16 15 12 7   CREATININE 1.13 1.29* 1.18 1.04  CALCIUM 8.6* 7.7* 7.3* 7.3*  AST 937* 528* 172* 76*  ALT 409* 354* 197* 135*  ALKPHOS 175* 127* 111 98  BILITOT 2.2* 3.2* 2.5* 1.0   ------------------------------------------------------------------------------------------------------------------ estimated creatinine clearance is 51.1 mL/min (by C-G formula based on SCr of 1.04 mg/dL). ------------------------------------------------------------------------------------------------------------------ No results for input(s): HGBA1C in the last 72 hours. ------------------------------------------------------------------------------------------------------------------ No results for input(s): CHOL, HDL, LDLCALC, TRIG, CHOLHDL, LDLDIRECT in the last 72 hours. ------------------------------------------------------------------------------------------------------------------ No results for input(s): TSH, T4TOTAL, T3FREE, THYROIDAB in the last 72 hours.  Invalid input(s): FREET3 ------------------------------------------------------------------------------------------------------------------ No  results for input(s): VITAMINB12, FOLATE, FERRITIN, TIBC, IRON, RETICCTPCT in the last 72 hours.  Coagulation profile  Recent Labs Lab 01/31/17 0837 02/01/17 0409  INR 1.34 1.46    No results for input(s): DDIMER in the last 72 hours.  Cardiac Enzymes No results for input(s): CKMB, TROPONINI, MYOGLOBIN in the last 168 hours.  Invalid input(s): CK ------------------------------------------------------------------------------------------------------------------ Invalid input(s): POCBNP   CBG:  Recent Labs Lab 02/01/17 1654 02/01/17 1938 02/01/17 2318 02/02/17 0355 02/02/17 0733  GLUCAP 100* 134* 76 96 92        Studies: Nm Hepatobiliary Including Gb  Result Date: 01/31/2017 CLINICAL DATA:  82 year old male with elevated LFTs, abdominal pain, and nausea and vomiting and chills. EXAM: NUCLEAR MEDICINE HEPATOBILIARY IMAGING TECHNIQUE: Sequential images of the abdomen were obtained out to 60 minutes following intravenous administration of radiopharmaceutical. RADIOPHARMACEUTICALS:  5.3 mCi Tc-55m Choletec IV COMPARISON:  Abdominal ultrasound dated 01/31/2017 and CT dated 01/31/2017 FINDINGS: There is prompt uptake of the radiotracer by the liver. No activity was seen in the gallbladder during the first 1 hour imaging. No significant activity in the bowel noted. Continued imaging was performed for an additional bladder. No uptake was noted within the gallbladder after a total of 2 hours. Radiotracer was noted within the small-bowel. It was decided to proceed with administration of morphine at this time. However, no morphine was given as the patient's blood pressure was low. IMPRESSION: No activity identified within the gallbladder after 2 hours of imaging. These findings are inconclusive for definite diagnosis of acute cholecystitis as no Morphine could be administered due to patient's low blood pressure. Electronically Signed   By: AAnner CreteM.D.   On: 01/31/2017 21:01   UKoreaAbdomen Complete  Result Date: 01/31/2017 CLINICAL DATA:  Elevated LFTs. EXAM: ABDOMEN ULTRASOUND COMPLETE COMPARISON:  CT 01/31/2017. FINDINGS: Gallbladder: No gallstones noted. Gallbladder wall thickening to 10.9 mm. This can be seen with hyperproteinemia and or cholecystitis. Negative Murphy sign. Common bile duct: Diameter: 6.3 mm Liver: No focal lesion identified. Within normal limits in parenchymal echogenicity. Mild intrahepatic biliary ductal dilatation again noted. IVC: No abnormality visualized. Pancreas: Visualized portion unremarkable. Spleen: Size and appearance within normal limits. Right Kidney: Length: 11.5 cm.  Echogenicity within normal limits. No mass or hydronephrosis visualized. Small amount of perinephric fluid noted. Left Kidney: Length: 10.8 cm. Echogenicity within normal limits. No mass or hydronephrosis visualized. Small amount of perinephric fluid noted. Abdominal aorta: Mild abdominal aortic ectasia 2.9 cm. Other findings: None. IMPRESSION: 1. No gallstones. Gallbladder wall prominent thickening at 10.9 mm. This could be from hyperproteinemia and /or cholecystitis. 2. Mild intrahepatic biliary ductal dilatation again noted. Common bile duct is normal in caliber at 6.3 mm. 3.  Mild amount of perinephric fluid noted. 4. Mild abdominal aortic ectasia 2.9 cm. Ectatic abdominal aorta at risk for aneurysm development. Recommend followup by ultrasound in 5 years. This recommendation follows ACR consensus guidelines: White Paper of the ACR Incidental Findings Committee II on Vascular Findings. J Am Coll Radiol 2013; 10:789-794. Electronically Signed   By: TMarcello Moores Register   On: 01/31/2017 10:22      Lab Results  Component Value Date   HGBA1C 5.9 11/01/2016   HGBA1C 6.0 07/04/2016   HGBA1C 5.6 02/20/2016   Lab Results  Component Value Date   MICROALBUR 0.7 10/27/2015   LDLCALC 50 07/04/2016   CREATININE 1.04 02/02/2017       Scheduled Meds: . aspirin  325 mg Oral Daily  . chlorhexidine  15  mL Mouth Rinse BID  . insulin aspart  0-9 Units Subcutaneous Q4H  .  morphine injection  3 mg Intravenous Once  . piperacillin-tazobactam (ZOSYN)  IV  3.375 g Intravenous Q8H   Continuous Infusions: . dextrose 5 % and 0.9% NaCl       LOS: 2 days    Time spent: >30 MINS    [facility]  [facility] Pager [phone]. If 7PM-7AM, please contact night-coverage at [email or web address], password [facility] 02/02/2017, 8:08 AM  LOS: 2 days

## 2017-02-03 LAB — COMPREHENSIVE METABOLIC PANEL
ALBUMIN: 2.5 g/dL — AB (ref 3.5–5.0)
ALT: 91 U/L — ABNORMAL HIGH (ref 17–63)
ANION GAP: 6 (ref 5–15)
AST: 38 U/L (ref 15–41)
Alkaline Phosphatase: 101 U/L (ref 38–126)
CALCIUM: 7.7 mg/dL — AB (ref 8.9–10.3)
CHLORIDE: 113 mmol/L — AB (ref 101–111)
CO2: 22 mmol/L (ref 22–32)
Creatinine, Ser: 1.05 mg/dL (ref 0.61–1.24)
GFR calc Af Amer: >60 mL/min (ref >60)
GFR calc non Af Amer: >60 mL/min (ref >60)
Glucose, Bld: 117 mg/dL — ABNORMAL HIGH (ref 65–99)
Potassium: 3.6 mmol/L (ref 3.5–5.1)
SODIUM: 141 mmol/L (ref 135–145)
Total Bilirubin: 0.8 mg/dL (ref 0.3–1.2)
Total Protein: 4.8 g/dL — ABNORMAL LOW (ref 6.5–8.1)

## 2017-02-03 LAB — GLUCOSE, CAPILLARY
GLUCOSE-CAPILLARY: 129 mg/dL — AB (ref 65–99)
GLUCOSE-CAPILLARY: 91 mg/dL (ref 65–99)
Glucose-Capillary: 111 mg/dL — ABNORMAL HIGH (ref 65–99)
Glucose-Capillary: 114 mg/dL — ABNORMAL HIGH (ref 65–99)
Glucose-Capillary: 104 mg/dL — ABNORMAL HIGH (ref 65–99)

## 2017-02-03 LAB — CBC
HCT: 33.1 % — ABNORMAL LOW (ref 39.0–52.0)
Hemoglobin: 11.2 g/dL — ABNORMAL LOW (ref 13.0–17.0)
MCH: 33.6 pg (ref 26.0–34.0)
MCHC: 33.8 g/dL (ref 30.0–36.0)
MCV: 99.4 fL (ref 78.0–100.0)
PLATELETS: 117 10*3/uL — AB (ref 150–400)
RBC: 3.33 MIL/uL — ABNORMAL LOW (ref 4.22–5.81)
RDW: 15.1 % (ref 11.5–15.5)
WBC: 7.4 10*3/uL (ref 4.0–10.5)

## 2017-02-03 MED ORDER — ENOXAPARIN SODIUM 40 MG/0.4ML ~~LOC~~ SOLN
40.0000 mg | SUBCUTANEOUS | Status: DC
  Administered 2017-02-04: 40 mg via SUBCUTANEOUS
  Filled 2017-02-03 (×2): qty 0.4

## 2017-02-03 MED ORDER — FINASTERIDE 5 MG PO TABS
5.0000 mg | ORAL_TABLET | Freq: Every day | ORAL | Status: DC
  Administered 2017-02-03 – 2017-02-04 (×2): 5 mg via ORAL
  Filled 2017-02-03 (×2): qty 1

## 2017-02-03 MED ORDER — ALPRAZOLAM 0.25 MG PO TABS: 0.2500 mg | ORAL_TABLET | Freq: Two times a day (BID) | ORAL | Status: DC | PRN

## 2017-02-03 MED ORDER — DONEPEZIL HCL 10 MG PO TABS
10.0000 mg | ORAL_TABLET | Freq: Every day | ORAL | Status: DC
  Administered 2017-02-03: 10 mg via ORAL
  Filled 2017-02-03: qty 1

## 2017-02-03 MED ORDER — CEFTRIAXONE SODIUM 2 G IJ SOLR
2.0000 g | INTRAMUSCULAR | Status: DC
  Administered 2017-02-03: 2 g via INTRAVENOUS
  Filled 2017-02-03 (×2): qty 2

## 2017-02-03 MED ORDER — INSULIN ASPART 100 UNIT/ML ~~LOC~~ SOLN: 0.0000 [IU] | Freq: Three times a day (TID) | SUBCUTANEOUS | Status: DC

## 2017-02-03 NOTE — Progress Notes (Signed)
Pt refusing bed alarm. Pt educated thoroughly on safety and prevention of falls. Pt aware of risks and still refuses  Ricardo Washington

## 2017-02-03 NOTE — Progress Notes (Signed)
Paged MD regarding pt advanced to cadiac/cab modified diet. Clarification on pt CBG q 4 hour and IV dextrose 5% in normal saline. Awaiting call back.  Ricardo Washington

## 2017-02-03 NOTE — Progress Notes (Signed)
  Subjective: Pt with no pain   Objective: Vital signs in last 24 hours: Temp:  [97.8 F (36.6 C)-98.5 F (36.9 C)] 98.4 F (36.9 C) (03/18 0509) Pulse Rate:  [51-66] 57 (03/18 0509) Resp:  [16-21] 16 (03/18 0509) BP: (109-145)/(46-78) 131/65 (03/18 0509) SpO2:  [94 %-97 %] 95 % (03/18 0509) Weight:  [81.1 kg (178 lb 12.8 oz)-81.9 kg (180 lb 9.6 oz)] 81.1 kg (178 lb 12.8 oz) (03/18 0509) Last BM Date: 02/03/17  Intake/Output from previous day: 03/17 0701 - 03/18 0700 In: 973.8 [P.O.:120; I.V.:853.8] Out: 2550 [Urine:2050; Stool:500] Intake/Output this shift: Total I/O In: -  Out: 100 [Urine:100]  General appearance: alert and cooperative GI: soft, non-tender; bowel sounds normal; no masses,  no organomegaly  Lab Results:   Recent Labs  02/02/17 0300 02/03/17 0523  WBC 8.6 7.4  HGB 11.4* 11.2*  HCT 33.3* 33.1*  PLT 112* 117*   BMET  Recent Labs  02/02/17 0300 02/03/17 0523  NA 141 141  K 3.5 3.6  CL 113* 113*  CO2 20* 22  GLUCOSE 94 117*  BUN 7 <5*  CREATININE 1.04 1.05  CALCIUM 7.3* 7.7*   PT/INR  Recent Labs  02/01/17 0409  LABPROT 17.9*  INR 1.46   ABG No results for input(s): PHART, HCO3 in the last 72 hours.  Invalid input(s): PCO2, PO2  Studies/Results: No results found.  Anti-infectives: Anti-infectives    Start     Dose/Rate Route Frequency Ordered Stop   02/03/17 1000  cefTRIAXone (ROCEPHIN) 2 g in dextrose 5 % 50 mL IVPB     2 g 100 mL/hr over 30 Minutes Intravenous Every 24 hours 02/03/17 0859     01/31/17 2045  piperacillin-tazobactam (ZOSYN) IVPB 3.375 g  Status:  Discontinued     3.375 g 12.5 mL/hr over 240 Minutes Intravenous Every 8 hours 01/31/17 2033 02/03/17 0859   01/31/17 1300  ciprofloxacin (CIPRO) IVPB 400 mg  Status:  Discontinued     400 mg 200 mL/hr over 60 Minutes Intravenous Every 12 hours 01/31/17 1144 01/31/17 2014   01/31/17 1200  vancomycin (VANCOCIN) IVPB 750 mg/150 ml premix  Status:  Discontinued      750 mg 150 mL/hr over 60 Minutes Intravenous Every 12 hours 01/31/17 0218 01/31/17 1144   01/31/17 0900  metroNIDAZOLE (FLAGYL) IVPB 500 mg  Status:  Discontinued     500 mg 100 mL/hr over 60 Minutes Intravenous Every 8 hours 01/31/17 0811 01/31/17 2014   01/31/17 0600  piperacillin-tazobactam (ZOSYN) IVPB 3.375 g  Status:  Discontinued     3.375 g 12.5 mL/hr over 240 Minutes Intravenous Every 8 hours 01/31/17 0218 01/31/17 1144   01/30/17 2315  piperacillin-tazobactam (ZOSYN) IVPB 3.375 g     3.375 g 100 mL/hr over 30 Minutes Intravenous  Once 01/30/17 2313 01/31/17 0050   01/30/17 2315  vancomycin (VANCOCIN) IVPB 1000 mg/200 mL premix     1,000 mg 200 mL/hr over 60 Minutes Intravenous  Once 01/30/17 2313 01/31/17 0150      Assessment/Plan: s/p * No surgery found *  No surgical plans Call back if needed.  LOS: 3 days    Rosario Jacks., [facility] 02/03/2017

## 2017-02-03 NOTE — Progress Notes (Signed)
Patient with no complaints or concerns during 7pm - 7am shift.  Esmerelda Finnigan, RN 

## 2017-02-03 NOTE — Progress Notes (Signed)
MD stated to discontinue continuous fluids and to change CBG from q4hour to [place]

## 2017-02-03 NOTE — Progress Notes (Signed)
[facility] PROGRESS NOTE  Ricardo Washington DXA:[id number] DOB: Jun 09, 1935 DOA: 01/30/2017   PCP: [name], MD     Assessment/Plan: Active Problems:   Essential hypertension   Coronary atherosclerosis   Diverticulosis of colon   BPH (benign prostatic hyperplasia)   CORONARY ARTERY BYPASS GRAFT, THREE VESSEL, HX OF   Memory loss   Diabetes mellitus, type 2 (HCC)   Sepsis, unspecified organism ([place])   Acute cholecystitis   Diverticulitis of colon   Acalculous cholecystitis   HPI: Ricardo Washington is a generally healthy, functional 82 y.o. male.  with early dementia.  BPH. HTN. Severe, colonic, pan diverticulosis.  Adenomatous colon polyps.  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  Gout.  DM 2, well contolled on oral agents presented with Acute onset non-bloody N/V; rigors, chills. Temp 101.2 in ED.  WBCs 20 K.   Liver function tests found to be grossly of normal T bili 2.2 >> 3.2.  Alk phos 175 >> 127.  AST/ALT 937/528 >> 409/354.   CT ab/pelvis: suspect acute cholecystitis.  "Slight" intrahepatic ductal dilatation.  Mild porta hepatis lymphadenopathy felt to be reactive.  Severe sigmoid diverticulosis with mild acute on chronic diverticulitis.   Ultrasound:  35m thick GB wall without stones/sludge.  Mild intrahepatic biliary ductal dilatation.  6.3 MM CBD.  Mild, bil, perinephric fluid. Ectatic, 2.9 cm abdominal aorta.   Assessment and plan Transaminitis/Acalculous cholecystitis HIDA + for cholecystitis Imaging suggest cholecystitis and sigmoid diverticulitis.  ? Acalculous cholecystitis? Wonder about passed CBD stone?.Marland Kitchen Abx given include Zosyn, vanc: x 1 dose.  Now on IV Zosyn, day #4, narrow antibiotics to Rocephin Blood culture positive for Klebsiella pneumonia,  pansensitive, with the exception of ampicillin HIDA showed no uptake into gallbladder so this could be a acalculous cholecystitis IR holding off on the drain for now after discussing with CCS, as the patient is slowly  improving Continue clear liquids  Hx CAD s/p CABG Continue ASA    Hx DM2 -hold home meds given above issues -SSI for now  Hx hypertension May resume low dose ACE inhibitor  Hx dementia -mild Resumed Aricept  Hx BPH Resume finasteride   DVT prophylaxsis SCDs  Code Status:  Full code     Family Communication: Discussed in detail with the patient, all imaging results, lab results explained to the patient   Disposition Plan:   Continue antibiotics for acalculous cholecystitis      Consultants:  Surgery  GI  IR  Procedures:  None  Antibiotics: Anti-infectives    Start     Dose/Rate Route Frequency Ordered Stop   01/31/17 2045  piperacillin-tazobactam (ZOSYN) IVPB 3.375 g     3.375 g 12.5 mL/hr over 240 Minutes Intravenous Every 8 hours 01/30/17 2033         HPI/Subjective: Denies any nausea,vomiting,tolerating clears  Objective: Vitals:   02/02/17 1759 02/02/17 1940 02/03/17 0055 02/03/17 0509  BP: (!) 145/78 140/62 (!) 109/46 131/65  Pulse: (!) 57 (!) 55 (!) 55 (!) 57  Resp: 18 16 17 16   Temp: 98.2 F (36.8 C) 98.5 F (36.9 C) 98.1 F (36.7 C) 98.4 F (36.9 C)  TempSrc: Oral Oral Oral Oral  SpO2: 96% 97% 94% 95%  Weight: 81.9 kg (180 lb 9.6 oz)   81.1 kg (178 lb 12.8 oz)  Height: 5' 5"  (1.651 m)       Intake/Output Summary (Last 24 hours) at 02/03/17 0900 Last data filed at 02/03/17 0800  Gross per  24 hour  Intake           973.75 ml  Output             2300 ml  Net         -1326.25 ml    Exam:  Examination:  General exam: Appears calm and comfortable  Respiratory system: Clear to auscultation. Respiratory effort normal. Cardiovascular system: S1 & S2 heard, RRR. No JVD, murmurs, rubs, gallops or clicks. No pedal edema. Gastrointestinal system: Abdomen is nondistended, soft and nontender. No organomegaly or masses felt. Normal bowel sounds heard. Central nervous system: Alert and oriented. No focal neurological  deficits. Extremities: Symmetric 5 x 5 power. Skin: No rashes, lesions or ulcers Psychiatry: Judgement and insight appear normal. Mood & affect appropriate.     Data Reviewed: I have personally reviewed following labs and imaging studies  Micro Results Recent Results (from the past 240 hour(s))  Blood culture (routine x 2)     Status: Abnormal   Collection Time: 01/30/17 11:40 PM  Result Value Ref Range Status   Specimen Description BLOOD RIGHT ANTECUBITAL  Final   Special Requests   Final    BOTTLES DRAWN AEROBIC AND ANAEROBIC AER 5cc ANA 5cc   Culture  Setup Time   Final    GRAM NEGATIVE RODS AEROBIC BOTTLE ONLY CRITICAL RESULT CALLED TO, READ BACK BY AND VERIFIED WITHMordecai Rasmussen RN [id number] 01/31/17 A BROWNING Performed at [facility], [address], [place], [place] [address]    Culture KLEBSIELLA PNEUMONIAE (A)  Final   Report Status 02/02/2017 FINAL  Final   Organism ID, Bacteria KLEBSIELLA PNEUMONIAE  Final      Susceptibility   Klebsiella pneumoniae - MIC*    AMPICILLIN RESISTANT Resistant     CEFAZOLIN <=4 SENSITIVE Sensitive     CEFEPIME <=1 SENSITIVE Sensitive     CEFTAZIDIME <=1 SENSITIVE Sensitive     CEFTRIAXONE <=1 SENSITIVE Sensitive     CIPROFLOXACIN <=0.25 SENSITIVE Sensitive     GENTAMICIN <=1 SENSITIVE Sensitive     IMIPENEM <=0.25 SENSITIVE Sensitive     TRIMETH/SULFA <=20 SENSITIVE Sensitive     AMPICILLIN/SULBACTAM 4 SENSITIVE Sensitive     PIP/TAZO <=4 SENSITIVE Sensitive     Extended ESBL NEGATIVE Sensitive     * KLEBSIELLA PNEUMONIAE  Blood Culture ID Panel (Reflexed)     Status: Abnormal   Collection Time: 01/30/17 11:40 PM  Result Value Ref Range Status   Enterococcus species NOT DETECTED NOT DETECTED Final   Listeria monocytogenes NOT DETECTED NOT DETECTED Final   Staphylococcus species NOT DETECTED NOT DETECTED Final   Staphylococcus aureus NOT DETECTED NOT DETECTED Final   Streptococcus species NOT DETECTED NOT DETECTED Final    Streptococcus agalactiae NOT DETECTED NOT DETECTED Final   Streptococcus pneumoniae NOT DETECTED NOT DETECTED Final   Streptococcus pyogenes NOT DETECTED NOT DETECTED Final   Acinetobacter baumannii NOT DETECTED NOT DETECTED Final   Enterobacteriaceae species DETECTED (A) NOT DETECTED Final    Comment: Enterobacteriaceae represent a large family of gram-negative bacteria, not a single organism. CRITICAL RESULT CALLED TO, READ BACK BY AND VERIFIED WITH: Mordecai Rasmussen RN [id number] 01/31/17 A BROWNING    Enterobacter cloacae complex NOT DETECTED NOT DETECTED Final   Escherichia coli NOT DETECTED NOT DETECTED Final   Klebsiella oxytoca NOT DETECTED NOT DETECTED Final   Klebsiella pneumoniae DETECTED (A) NOT DETECTED Final    Comment: CRITICAL RESULT CALLED TO, READ BACK BY AND VERIFIED WITH: K  [place] 01/31/17 A BROWNING    Proteus species NOT DETECTED NOT DETECTED Final   Serratia marcescens NOT DETECTED NOT DETECTED Final   Carbapenem resistance NOT DETECTED NOT DETECTED Final   Haemophilus influenzae NOT DETECTED NOT DETECTED Final   Neisseria meningitidis NOT DETECTED NOT DETECTED Final   Pseudomonas aeruginosa NOT DETECTED NOT DETECTED Final   Candida albicans NOT DETECTED NOT DETECTED Final   Candida glabrata NOT DETECTED NOT DETECTED Final   Candida krusei NOT DETECTED NOT DETECTED Final   Candida parapsilosis NOT DETECTED NOT DETECTED Final   Candida tropicalis NOT DETECTED NOT DETECTED Final    Comment: Performed at [facility], [place] [address], [place], [place] [address]  MRSA PCR Screening     Status: None   Collection Time: 01/31/17  7:36 AM  Result Value Ref Range Status   MRSA by PCR NEGATIVE NEGATIVE Final    Comment:        The GeneXpert MRSA Assay (FDA approved for NASAL specimens only), is one component of a comprehensive MRSA colonization surveillance program. It is not intended to diagnose MRSA infection nor to guide or monitor treatment for MRSA  infections.   Urine culture     Status: Abnormal   Collection Time: 01/31/17  8:57 AM  Result Value Ref Range Status   Specimen Description URINE, RANDOM  Final   Special Requests NONE  Final   Culture <10,000 COLONIES/mL >=100,000 COLONIES/mL (A)  Final   Report Status 02/01/2017 FINAL  Final    Radiology Reports Nm Hepatobiliary Including Gb  Result Date: 01/31/2017 CLINICAL DATA:  82 year old male with elevated LFTs, abdominal pain, and nausea and vomiting and chills. EXAM: NUCLEAR MEDICINE HEPATOBILIARY IMAGING TECHNIQUE: Sequential images of the abdomen were obtained out to 60 minutes following intravenous administration of radiopharmaceutical. RADIOPHARMACEUTICALS:  5.3 mCi Tc-54m Choletec IV COMPARISON:  Abdominal ultrasound dated 01/31/2017 and CT dated 01/31/2017 FINDINGS: There is prompt uptake of the radiotracer by the liver. No activity was seen in the gallbladder during the first 1 hour imaging. No significant activity in the bowel noted. Continued imaging was performed for an additional bladder. No uptake was noted within the gallbladder after a total of 2 hours. Radiotracer was noted within the small-bowel. It was decided to proceed with administration of morphine at this time. However, no morphine was given as the patient's blood pressure was low. IMPRESSION: No activity identified within the gallbladder after 2 hours of imaging. These findings are inconclusive for definite diagnosis of acute cholecystitis as no Morphine could be administered due to patient's low blood pressure. Electronically Signed   By: AAnner CreteM.D.   On: 01/31/2017 21:01   UKoreaAbdomen Complete  Result Date: 01/31/2017 CLINICAL DATA:  Elevated LFTs. EXAM: ABDOMEN ULTRASOUND COMPLETE COMPARISON:  CT 01/31/2017. FINDINGS: Gallbladder: No gallstones noted. Gallbladder wall thickening to 10.9 mm. This can be seen with hyperproteinemia and or cholecystitis. Negative Murphy sign. Common bile duct: Diameter:  6.3 mm Liver: No focal lesion identified. Within normal limits in parenchymal echogenicity. Mild intrahepatic biliary ductal dilatation again noted. IVC: No abnormality visualized. Pancreas: Visualized portion unremarkable. Spleen: Size and appearance within normal limits. Right Kidney: Length: 11.5 cm. Echogenicity within normal limits. No mass or hydronephrosis visualized. Small amount of perinephric fluid noted. Left Kidney: Length: 10.8 cm. Echogenicity within normal limits. No mass or hydronephrosis visualized. Small amount of perinephric fluid noted. Abdominal aorta: Mild abdominal aortic ectasia 2.9 cm. Other findings: None. IMPRESSION: 1. No gallstones.  Gallbladder wall prominent thickening at 10.9 mm. This could be from hyperproteinemia and /or cholecystitis. 2. Mild intrahepatic biliary ductal dilatation again noted. Common bile duct is normal in caliber at 6.3 mm. 3.  Mild amount of perinephric fluid noted. 4. Mild abdominal aortic ectasia 2.9 cm. Ectatic abdominal aorta at risk for aneurysm development. Recommend followup by ultrasound in 5 years. This recommendation follows ACR consensus guidelines: White Paper of the ACR Incidental Findings Committee II on Vascular Findings. J Am Coll Radiol 2013; 10:789-794. Electronically Signed   By: [place]   On: 01/31/2017 10:22   Ct Abdomen Pelvis W Contrast  Result Date: 01/31/2017 CLINICAL DATA:  Shaking, nausea and vomiting today. History of diverticulosis, umbilical hernia. EXAM: CT ABDOMEN AND PELVIS WITH CONTRAST TECHNIQUE: Multidetector CT imaging of the abdomen and pelvis was performed using the standard protocol following bolus administration of intravenous contrast. CONTRAST:  151m ISOVUE-300 IOPAMIDOL (ISOVUE-300) INJECTION 61% COMPARISON:  None. FINDINGS: LOWER CHEST: Bilateral lower lobe atelectasis. The heart is mildly enlarged. Status post median sternotomy for CABG. No pericardial effusions. HEPATOBILIARY: Mild gallbladder distention  and pericholecystic fluid. Slight intrahepatic biliary dilatation. PANCREAS: Normal. SPLEEN: Normal. ADRENALS/URINARY TRACT: Kidneys are orthotopic, demonstrating symmetric enhancement. No nephrolithiasis, hydronephrosis or solid renal masses. The unopacified ureters are normal in course and caliber. Delayed imaging through the kidneys demonstrates symmetric prompt contrast excretion within the proximal urinary collecting system. Urinary bladder is partially distended and unremarkable. Normal adrenal glands. STOMACH/BOWEL: Severe colonic diverticulosis with rectosigmoid wall thickening and mild pericolonic fat stranding. Focal eccentric wall thickening greater curvature the stomach on axial images corresponds to fold on sagittal series. VASCULAR/LYMPHATIC: Aortoiliac vessels are normal in course and caliber, severe calcific atherosclerosis. Mild porta hepatis lymphadenopathy is likely reactive. Multiple phleboliths in the pelvis. REPRODUCTIVE: Normal. OTHER: No intraperitoneal free fluid or free air. MUSCULOSKELETAL: Nonacute. Grade 1 L5-S1 anterolisthesis on degenerative basis. IMPRESSION: Acute suspected cholecystitis. Mild intrahepatic biliary dilatation. Severe sigmoid diverticulosis and mild acute on chronic sigmoid diverticulitis. These results will be called to the ordering clinician or representative by the Radiologist Assistant, and communication documented in the [facility] Dashboard. Electronically Signed   By: CElon AlasM.D.   On: 01/31/2017 03:09   Dg Chest Port 1 View  Result Date: 01/31/2017 CLINICAL DATA:  82year old male with fever. EXAM: PORTABLE CHEST 1 VIEW COMPARISON:  Chest radiograph dated 05/05/2011 and 02/20/2016 FINDINGS: There is shallow inspiration. No focal consolidation, pleural effusion, or pneumothorax. There is mild prominence of the central vasculature. The cardiac silhouette is within normal limits. Median sternotomy wires and CABG vascular clips noted. No acute osseous  pathology identified. IMPRESSION: No acute cardiopulmonary process.  No focal consolidation. Electronically Signed   By: AAnner CreteM.D.   On: 01/31/2017 00:49     CBC  Recent Labs Lab 01/30/17 2235 01/31/17 0837 02/01/17 0409 02/02/17 0300 02/03/17 0523  WBC 10.2 20.0* 11.0* 8.6 7.4  HGB 13.0 11.8* 10.5* 11.4* 11.2*  HCT 38.5* 34.8* 31.4* 33.3* 33.1*  PLT 187 131* 130* 112* 117*  MCV 101.6* 101.2* 101.9* 100.3* 99.4  MCH 34.3* 34.3* 34.1* 34.3* 33.6  MCHC 33.8 33.9 33.4 34.2 33.8  RDW 14.3 14.8 15.5 15.4 15.1  LYMPHSABS 0.4* 1.2  --   --   --   MONOABS 0.2 1.2*  --   --   --   EOSABS 0.0 0.0  --   --   --   BASOSABS 0.0 0.0  --   --   --  Chemistries   Recent Labs Lab 01/30/17 2335 01/31/17 0837 02/01/17 0409 02/02/17 0300 02/03/17 0523  NA 139 140 142 141 141  K 3.8 4.2 4.1 3.5 3.6  CL 103 109 110 113* 113*  CO2 25 22 23  20* 22  GLUCOSE 162* 112* 80 94 117*  BUN 16 15 12 7  <5*  CREATININE 1.13 1.29* 1.18 1.04 1.05  CALCIUM 8.6* 7.7* 7.3* 7.3* 7.7*  AST 937* 528* 172* 76* 38  ALT 409* 354* 197* 135* 91*  ALKPHOS 175* 127* 111 98 101  BILITOT 2.2* 3.2* 2.5* 1.0 0.8   ------------------------------------------------------------------------------------------------------------------ estimated creatinine clearance is 55 mL/min (by C-G formula based on SCr of 1.05 mg/dL). ------------------------------------------------------------------------------------------------------------------ No results for input(s): HGBA1C in the last 72 hours. ------------------------------------------------------------------------------------------------------------------ No results for input(s): CHOL, HDL, LDLCALC, TRIG, CHOLHDL, LDLDIRECT in the last 72 hours. ------------------------------------------------------------------------------------------------------------------ No results for input(s): TSH, T4TOTAL, T3FREE, THYROIDAB in the last 72 hours.  Invalid input(s):  FREET3 ------------------------------------------------------------------------------------------------------------------ No results for input(s): VITAMINB12, FOLATE, FERRITIN, TIBC, IRON, RETICCTPCT in the last 72 hours.  Coagulation profile  Recent Labs Lab 01/31/17 0837 02/01/17 0409  INR 1.34 1.46    No results for input(s): DDIMER in the last 72 hours.  Cardiac Enzymes No results for input(s): CKMB, TROPONINI, MYOGLOBIN in the last 168 hours.  Invalid input(s): CK ------------------------------------------------------------------------------------------------------------------ Invalid input(s): POCBNP   CBG:  Recent Labs Lab 02/02/17 1642 02/02/17 1947 02/02/17 2327 02/03/17 0430 02/03/17 0803  GLUCAP 119* 93 109* 111* 114*       Studies: No results found.    Lab Results  Component Value Date   HGBA1C 5.9 11/01/2016   HGBA1C 6.0 07/04/2016   HGBA1C 5.6 02/20/2016   Lab Results  Component Value Date   MICROALBUR 0.7 10/27/2015   LDLCALC 50 07/04/2016   CREATININE 1.05 02/03/2017       Scheduled Meds: . aspirin  325 mg Oral Daily  . cefTRIAXone (ROCEPHIN)  IV  2 g Intravenous Q24H  . chlorhexidine  15 mL Mouth Rinse BID  . insulin aspart  0-9 Units Subcutaneous Q4H  .  morphine injection  3 mg Intravenous Once   Continuous Infusions: . dextrose 5 % and 0.9% NaCl 75 mL/hr at 02/02/17 2038     LOS: 3 days    Time spent: >30 MINS    [facility] Pager [phone]. If 7PM-7AM, please contact night-coverage at [email or web address], password [facility] 02/03/2017, 9:00 AM  LOS: 3 days

## 2017-02-04 DIAGNOSIS — A414 Sepsis due to anaerobes: Secondary | ICD-10-CM

## 2017-02-04 LAB — COMPREHENSIVE METABOLIC PANEL WITH GFR
ALT: 76 U/L — ABNORMAL HIGH (ref 17–63)
AST: 30 U/L (ref 15–41)
Albumin: 2.7 g/dL — ABNORMAL LOW (ref 3.5–5.0)
Alkaline Phosphatase: 96 U/L (ref 38–126)
Anion gap: 8 (ref 5–15)
BUN: <5 mg/dL — ABNORMAL LOW (ref 6–20)
CO2: 24 mmol/L (ref 22–32)
Calcium: 8.3 mg/dL — ABNORMAL LOW (ref 8.9–10.3)
Chloride: 107 mmol/L (ref 101–111)
Creatinine, Ser: 0.99 mg/dL (ref 0.61–1.24)
GFR calc Af Amer: >60 mL/min
GFR calc non Af Amer: >60 mL/min
Glucose, Bld: 86 mg/dL (ref 65–99)
Potassium: 3.8 mmol/L (ref 3.5–5.1)
Sodium: 139 mmol/L (ref 135–145)
Total Bilirubin: 0.8 mg/dL (ref 0.3–1.2)
Total Protein: 5.4 g/dL — ABNORMAL LOW (ref 6.5–8.1)

## 2017-02-04 LAB — GLUCOSE, CAPILLARY: Glucose-Capillary: 82 mg/dL (ref 65–99)

## 2017-02-04 MED ORDER — CEPHALEXIN 500 MG PO CAPS
500.0000 mg | ORAL_CAPSULE | Freq: Three times a day (TID) | ORAL | Status: DC
  Administered 2017-02-04: 500 mg via ORAL
  Filled 2017-02-04: qty 1

## 2017-02-04 MED ORDER — CEPHALEXIN 500 MG PO CAPS: 500.0000 mg | ORAL_CAPSULE | Freq: Three times a day (TID) | ORAL | 0 refills | Status: AC

## 2017-02-04 MED ORDER — ONDANSETRON HCL 4 MG PO TABS: 4.0000 mg | ORAL_TABLET | Freq: Three times a day (TID) | ORAL | 0 refills | Status: DC | PRN

## 2017-02-04 NOTE — Progress Notes (Signed)
Pt discharged education gone over at bedside with pt and wife. Pt IV discontinued and catheter intact. Telemetry removed. Pt has all belongings in hand and discharge paperwork. Pt discharged via wheelchair with volunteers   Viha Kriegel Leory Plowman

## 2017-02-04 NOTE — Care Management Important Message (Signed)
Important Message  Patient Details  Name: Ricardo Washington MRN: [id number] Date of Birth: 1935-06-04   Medicare Important Message Given:  Yes    Courtland Reas [address] 02/04/2017, 12:11 PM

## 2017-02-04 NOTE — Discharge Summary (Addendum)
Physician Discharge Summary  KYANDRE OKRAY MRN: [id number] DOB/AGE: Nov 19, 1935 82 y.o.  PCP: [name], MD   Admit date: 01/30/2017 Discharge date: 02/04/2017  Discharge Diagnoses:    Principal Problem:   Sepsis due to Klebsiella pneumoniae [facility]) Active Problems:   Essential hypertension   Coronary atherosclerosis   Diverticulosis of colon   BPH (benign prostatic hyperplasia)   CORONARY ARTERY BYPASS GRAFT, THREE VESSEL, HX OF   Memory loss   Diabetes mellitus, type 2 (HCC)   Acute cholecystitis   Diverticulitis of colon   Acalculous cholecystitis    Follow-up recommendations Follow-up with PCP in 3-5 days , including all  additional recommended appointments as below Follow-up CBC, CMP in 3-5 days Statin discontinued secondary to abnormal liver function General surgery follow-up as advised     Current Discharge Medication List    START taking these medications   Details  cephALEXin (KEFLEX) 500 MG capsule Take 1 capsule (500 mg total) by mouth every 8 (eight) hours. Qty: 36 capsule, Refills: 0    ondansetron (ZOFRAN) 4 MG tablet Take 1 tablet (4 mg total) by mouth every 8 (eight) hours as needed for nausea or vomiting. Qty: 20 tablet, Refills: 0      CONTINUE these medications which have NOT CHANGED   Details  allopurinol (ZYLOPRIM) 100 MG tablet TAKE ONE TABLET BY MOUTH ON MONDAY, WEDNESDAY, AND FRIDAY Qty: 30 tablet, Refills: 3    ALPRAZolam (NIRAVAM) 0.25 MG dissolvable tablet Take 0.25 mg by mouth at bedtime as needed for anxiety.    aspirin 325 MG tablet Take 325 mg by mouth daily.      benzonatate (TESSALON) 100 MG capsule Take 1 capsule (100 mg total) by mouth 2 (two) times daily as needed for cough. Qty: 20 capsule, Refills: 0    donepezil (ARICEPT ODT) 10 MG disintegrating tablet Take 1 tablet (10 mg total) by mouth at bedtime. Qty: 30 tablet, Refills: 11    finasteride (PROSCAR) 5 MG tablet TAKE ONE TABLET BY MOUTH DAILY Qty: 90 tablet,  Refills: 1    metoprolol tartrate (LOPRESSOR) 25 MG tablet TAKE ONE TABLET BY MOUTH TWICE DAILY Qty: 180 tablet, Refills: 1    metroNIDAZOLE (METROCREAM) 0.75 % cream Apply 1 application topically 2 (two) times daily.    MULTIPLE VITAMIN PO Take 1 tablet by mouth daily.     nitroGLYCERIN (NITROSTAT) 0.4 MG SL tablet Place 1 tablet (0.4 mg total) under the tongue every 5 (five) minutes as needed for chest pain. Qty: 25 tablet, Refills: 3    oxybutynin (DITROPAN-XL) 10 MG 24 hr tablet TAKE ONE TABLET BY MOUTH DAILY Qty: 90 tablet, Refills: 1    tamsulosin (FLOMAX) 0.4 MG CAPS capsule TAKE ONE CAPSULE BY MOUTH DAILY Qty: 90 capsule, Refills: 1    Trospium Chloride 60 MG CP24 Take 1 capsule by mouth daily. Refills: 3      STOP taking these medications     indomethacin (INDOCIN) 25 MG capsule      lisinopril (PRINIVIL,ZESTRIL) 5 MG tablet      metFORMIN (GLUCOPHAGE-XR) 500 MG 24 hr tablet      rosuvastatin (CRESTOR) 10 MG tablet      sodium fluoride (DENTA 5000 PLUS) 1.1 % CREA dental cream          Discharge Condition:  Discharge Instructions Get Medicines reviewed and adjusted: Please take all your medications with you for your next visit with your Primary MD  Please request your Primary MD to go over all  hospital tests and procedure/radiological results at the follow up, please ask your Primary MD to get all Hospital records sent to his/her office.  If you experience worsening of your admission symptoms, develop shortness of breath, life threatening emergency, suicidal or homicidal thoughts you must seek medical attention immediately by calling 911 or calling your MD immediately if symptoms less severe.  You must read complete instructions/literature along with all the possible adverse reactions/side effects for all the Medicines you take and that have been prescribed to you. Take any new Medicines after you have completely understood and accpet all the possible adverse  reactions/side effects.   Do not drive when taking Pain medications.   Do not take more than prescribed Pain, Sleep and Anxiety Medications  Special Instructions: If you have smoked or chewed Tobacco in the last 2 yrs please stop smoking, stop any regular Alcohol and or any Recreational drug use.  Wear Seat belts while driving.  Please note  You were cared for by a hospitalist during your hospital stay. Once you are discharged, your primary care physician will handle any further medical issues. Please note that NO REFILLS for any discharge medications will be authorized once you are discharged, as it is imperative that you return to your primary care physician (or establish a relationship with a primary care physician if you do not have one) for your aftercare needs so that they can reassess your need for medications and monitor your lab values.     Allergies  Allergen Reactions  . Sulfonamide Derivatives     Unknown, per pt and "cant stand it"      Disposition: 01-Home or Self Care   Consults:  Gen. surgery GI    Significant Diagnostic Studies:  Nm Hepatobiliary Including Gb  Result Date: 01/31/2017 CLINICAL DATA:  82 year old male with elevated LFTs, abdominal pain, and nausea and vomiting and chills. EXAM: NUCLEAR MEDICINE HEPATOBILIARY IMAGING TECHNIQUE: Sequential images of the abdomen were obtained out to 60 minutes following intravenous administration of radiopharmaceutical. RADIOPHARMACEUTICALS:  5.3 mCi Tc-67m Choletec IV COMPARISON:  Abdominal ultrasound dated 01/31/2017 and CT dated 01/31/2017 FINDINGS: There is prompt uptake of the radiotracer by the liver. No activity was seen in the gallbladder during the first 1 hour imaging. No significant activity in the bowel noted. Continued imaging was performed for an additional bladder. No uptake was noted within the gallbladder after a total of 2 hours. Radiotracer was noted within the small-bowel. It was decided to  proceed with administration of morphine at this time. However, no morphine was given as the patient's blood pressure was low. IMPRESSION: No activity identified within the gallbladder after 2 hours of imaging. These findings are inconclusive for definite diagnosis of acute cholecystitis as no Morphine could be administered due to patient's low blood pressure. Electronically Signed   By: AAnner CreteM.D.   On: 01/31/2017 21:01   [place]oreaAbdomen Complete  Result Date: 01/31/2017 CLINICAL DATA:  Elevated LFTs. EXAM: ABDOMEN ULTRASOUND COMPLETE COMPARISON:  CT 01/31/2017. FINDINGS: Gallbladder: No gallstones noted. Gallbladder wall thickening to 10.9 mm. This can be seen with hyperproteinemia and or cholecystitis. Negative Murphy sign. Common bile duct: Diameter: 6.3 mm Liver: No focal lesion identified. Within normal limits in parenchymal echogenicity. Mild intrahepatic biliary ductal dilatation again noted. IVC: No abnormality visualized. Pancreas: Visualized portion unremarkable. Spleen: Size and appearance within normal limits. Right Kidney: Length: 11.5 cm. Echogenicity within normal limits. No mass or hydronephrosis visualized. Small amount of perinephric fluid noted. Left Kidney:  Length: 10.8 cm. Echogenicity within normal limits. No mass or hydronephrosis visualized. Small amount of perinephric fluid noted. Abdominal aorta: Mild abdominal aortic ectasia 2.9 cm. Other findings: None. IMPRESSION: 1. No gallstones. Gallbladder wall prominent thickening at 10.9 mm. This could be from hyperproteinemia and /or cholecystitis. 2. Mild intrahepatic biliary ductal dilatation again noted. Common bile duct is normal in caliber at 6.3 mm. 3.  Mild amount of perinephric fluid noted. 4. Mild abdominal aortic ectasia 2.9 cm. Ectatic abdominal aorta at risk for aneurysm development. Recommend followup by ultrasound in 5 years. This recommendation follows ACR consensus guidelines: White Paper of the ACR Incidental Findings  Committee II on Vascular Findings. J Am Coll Radiol 2013; 10:789-794. Electronically Signed   By: [place]   On: 01/31/2017 10:22   Ct Abdomen Pelvis W Contrast  Result Date: 01/31/2017 CLINICAL DATA:  Shaking, nausea and vomiting today. History of diverticulosis, umbilical hernia. EXAM: CT ABDOMEN AND PELVIS WITH CONTRAST TECHNIQUE: Multidetector CT imaging of the abdomen and pelvis was performed using the standard protocol following bolus administration of intravenous contrast. CONTRAST:  142m ISOVUE-300 IOPAMIDOL (ISOVUE-300) INJECTION 61% COMPARISON:  None. FINDINGS: LOWER CHEST: Bilateral lower lobe atelectasis. The heart is mildly enlarged. Status post median sternotomy for CABG. No pericardial effusions. HEPATOBILIARY: Mild gallbladder distention and pericholecystic fluid. Slight intrahepatic biliary dilatation. PANCREAS: Normal. SPLEEN: Normal. ADRENALS/URINARY TRACT: Kidneys are orthotopic, demonstrating symmetric enhancement. No nephrolithiasis, hydronephrosis or solid renal masses. The unopacified ureters are normal in course and caliber. Delayed imaging through the kidneys demonstrates symmetric prompt contrast excretion within the proximal urinary collecting system. Urinary bladder is partially distended and unremarkable. Normal adrenal glands. STOMACH/BOWEL: Severe colonic diverticulosis with rectosigmoid wall thickening and mild pericolonic fat stranding. Focal eccentric wall thickening greater curvature the stomach on axial images corresponds to fold on sagittal series. VASCULAR/LYMPHATIC: Aortoiliac vessels are normal in course and caliber, severe calcific atherosclerosis. Mild porta hepatis lymphadenopathy is likely reactive. Multiple phleboliths in the pelvis. REPRODUCTIVE: Normal. OTHER: No intraperitoneal free fluid or free air. MUSCULOSKELETAL: Nonacute. Grade 1 L5-S1 anterolisthesis on degenerative basis. IMPRESSION: Acute suspected cholecystitis. Mild intrahepatic biliary  dilatation. Severe sigmoid diverticulosis and mild acute on chronic sigmoid diverticulitis. These results will be called to the ordering clinician or representative by the Radiologist Assistant, and communication documented in the zVision Dashboard. Electronically Signed   By: CElon AlasM.D.   On: 01/31/2017 03:09   Dg Chest Port 1 View  Result Date: 01/31/2017 CLINICAL DATA:  82year old male with fever. EXAM: PORTABLE CHEST 1 VIEW COMPARISON:  Chest radiograph dated 05/05/2011 and 02/20/2016 FINDINGS: There is shallow inspiration. No focal consolidation, pleural effusion, or pneumothorax. There is mild prominence of the central vasculature. The cardiac silhouette is within normal limits. Median sternotomy wires and CABG vascular clips noted. No acute osseous pathology identified. IMPRESSION: No acute cardiopulmonary process.  No focal consolidation. Electronically Signed   By: AAnner CreteM.D.   On: 01/31/2017 00:49       Filed Weights   02/02/17 1759 02/03/17 0509 02/04/17 0541  Weight: 81.9 kg (180 lb 9.6 oz) 81.1 kg (178 lb 12.8 oz) 79.3 kg (174 lb 12.8 oz)     Microbiology: Recent Results (from the past 240 hour(s))  Blood culture (routine x 2)     Status: Abnormal   Collection Time: 01/30/17 11:40 PM  Result Value Ref Range Status   Specimen Description BLOOD RIGHT ANTECUBITAL  Final   Special Requests   Final    BOTTLES DRAWN AEROBIC  AND ANAEROBIC AER 5cc ANA 5cc   Culture  Setup Time   Final    GRAM NEGATIVE RODS AEROBIC BOTTLE ONLY CRITICAL RESULT CALLED TO, READ BACK BY AND VERIFIED WITHMordecai Rasmussen RN [id number] 01/31/17 A BROWNING Performed at [facility], [place] [address] [place], [place] [address]    Culture KLEBSIELLA PNEUMONIAE (A)  Final   Report Status 02/02/2017 FINAL  Final   Organism ID, Bacteria KLEBSIELLA PNEUMONIAE  Final      Susceptibility   Klebsiella pneumoniae - MIC*    AMPICILLIN RESISTANT Resistant     CEFAZOLIN <=4 SENSITIVE Sensitive      CEFEPIME <=1 SENSITIVE Sensitive     CEFTAZIDIME <=1 SENSITIVE Sensitive     CEFTRIAXONE <=1 SENSITIVE Sensitive     CIPROFLOXACIN <=0.25 SENSITIVE Sensitive     GENTAMICIN <=1 SENSITIVE Sensitive     IMIPENEM <=0.25 SENSITIVE Sensitive     TRIMETH/SULFA <=20 SENSITIVE Sensitive     AMPICILLIN/SULBACTAM 4 SENSITIVE Sensitive     PIP/TAZO <=4 SENSITIVE Sensitive     Extended ESBL NEGATIVE Sensitive     * KLEBSIELLA PNEUMONIAE  Blood Culture ID Panel (Reflexed)     Status: Abnormal   Collection Time: 01/30/17 11:40 PM  Result Value Ref Range Status   Enterococcus species NOT DETECTED NOT DETECTED Final   Listeria monocytogenes NOT DETECTED NOT DETECTED Final   Staphylococcus species NOT DETECTED NOT DETECTED Final   Staphylococcus aureus NOT DETECTED NOT DETECTED Final   Streptococcus species NOT DETECTED NOT DETECTED Final   Streptococcus agalactiae NOT DETECTED NOT DETECTED Final   Streptococcus pneumoniae NOT DETECTED NOT DETECTED Final   Streptococcus pyogenes NOT DETECTED NOT DETECTED Final   Acinetobacter baumannii NOT DETECTED NOT DETECTED Final   Enterobacteriaceae species DETECTED (A) NOT DETECTED Final    Comment: Enterobacteriaceae represent a large family of gram-negative bacteria, not a single organism. CRITICAL RESULT CALLED TO, READ BACK BY AND VERIFIED WITH: Mordecai Rasmussen RN [id number] 01/31/17 A BROWNING    Enterobacter cloacae complex NOT DETECTED NOT DETECTED Final   Escherichia coli NOT DETECTED NOT DETECTED Final   Klebsiella oxytoca NOT DETECTED NOT DETECTED Final   Klebsiella pneumoniae DETECTED (A) NOT DETECTED Final    Comment: CRITICAL RESULT CALLED TO, READ BACK BY AND VERIFIED WITH: Mordecai Rasmussen RN 1946 01/31/17 A BROWNING    Proteus species NOT DETECTED NOT DETECTED Final   Serratia marcescens NOT DETECTED NOT DETECTED Final   Carbapenem resistance NOT DETECTED NOT DETECTED Final   Haemophilus influenzae NOT DETECTED NOT DETECTED Final   Neisseria meningitidis NOT  DETECTED NOT DETECTED Final   Pseudomonas aeruginosa NOT DETECTED NOT DETECTED Final   Candida albicans NOT DETECTED NOT DETECTED Final   Candida glabrata NOT DETECTED NOT DETECTED Final   Candida krusei NOT DETECTED NOT DETECTED Final   Candida parapsilosis NOT DETECTED NOT DETECTED Final   Candida tropicalis NOT DETECTED NOT DETECTED Final    Comment: Performed at [facility], [place] [address], [place], [place] [address]  MRSA PCR Screening     Status: None   Collection Time: 01/31/17  7:36 AM  Result Value Ref Range Status   MRSA by PCR NEGATIVE NEGATIVE Final    Comment:        The GeneXpert MRSA Assay (FDA approved for NASAL specimens only), is one component of a comprehensive MRSA colonization surveillance program. It is not intended to diagnose MRSA infection nor to guide or monitor treatment for MRSA infections.  Urine culture     Status: Abnormal   Collection Time: 01/31/17  8:57 AM  Result Value Ref Range Status   Specimen Description URINE, RANDOM  Final   Special Requests NONE  Final   Culture <10,000 COLONIES/mL >=100,000 COLONIES/mL (A)  Final   Report Status 02/01/2017 FINAL  Final       Blood Culture    Component Value Date/Time   SDES URINE, RANDOM 01/31/2017 0857   SPECREQUEST NONE 01/31/2017 0857   CULT <10,000 COLONIES/mL >=100,000 COLONIES/mL (A) 01/31/2017 0857   REPTSTATUS 02/01/2017 FINAL 01/31/2017 0857      Labs: Results for orders placed or performed during the hospital encounter of 01/30/17 (from the past 48 hour(s))  Glucose, capillary     Status: Abnormal   Collection Time: 02/02/17 12:27 PM  Result Value Ref Range   Glucose-Capillary 117 (H) 65 - 99 mg/dL  Glucose, capillary     Status: Abnormal   Collection Time: 02/02/17  4:42 PM  Result Value Ref Range   Glucose-Capillary 119 (H) 65 - 99 mg/dL  Glucose, capillary     Status: None   Collection Time: 02/02/17  7:47 PM  Result Value Ref Range   Glucose-Capillary 93 65 - 99  mg/dL   Comment 1 Notify RN    Comment 2 Document in Chart   Glucose, capillary     Status: Abnormal   Collection Time: 02/02/17 11:27 PM  Result Value Ref Range   Glucose-Capillary 109 (H) 65 - 99 mg/dL  Glucose, capillary     Status: Abnormal   Collection Time: 02/03/17  4:30 AM  Result Value Ref Range   Glucose-Capillary 111 (H) 65 - 99 mg/dL  CBC     Status: Abnormal   Collection Time: 02/03/17  5:23 AM  Result Value Ref Range   WBC 7.4 4.0 - 10.5 K/uL   RBC 3.33 (L) 4.22 - 5.81 MIL/uL   Hemoglobin 11.2 (L) 13.0 - 17.0 g/dL   HCT 33.1 (L) 39.0 - 52.0 %   MCV 99.4 78.0 - 100.0 fL   MCH 33.6 26.0 - 34.0 pg   MCHC 33.8 30.0 - 36.0 g/dL   RDW 15.1 11.5 - 15.5 %   Platelets 117 (L) 150 - 400 K/uL    Comment: CONSISTENT WITH PREVIOUS RESULT  Comprehensive metabolic panel     Status: Abnormal   Collection Time: 02/03/17  5:23 AM  Result Value Ref Range   Sodium 141 135 - 145 mmol/L   Potassium 3.6 3.5 - 5.1 mmol/L   Chloride 113 (H) 101 - 111 mmol/L   CO2 22 22 - 32 mmol/L   Glucose, Bld 117 (H) 65 - 99 mg/dL   BUN <5 (L) 6 - 20 mg/dL   Creatinine, Ser 1.05 0.61 - 1.24 mg/dL   Calcium 7.7 (L) 8.9 - 10.3 mg/dL   Total Protein 4.8 (L) 6.5 - 8.1 g/dL   Albumin 2.5 (L) 3.5 - 5.0 g/dL   AST 38 15 - 41 U/L   ALT 91 (H) 17 - 63 U/L   Alkaline Phosphatase 101 38 - 126 U/L   Total Bilirubin 0.8 0.3 - 1.2 mg/dL   GFR calc non Af Amer >60 >60 mL/min   GFR calc Af Amer >60 >60 mL/min    Comment: (NOTE) The eGFR has been calculated using the CKD EPI equation. This calculation has not been validated in all clinical situations. eGFR's persistently <60 mL/min signify possible Chronic Kidney Disease.    Anion gap  6 5 - 15  Glucose, capillary     Status: Abnormal   Collection Time: 02/03/17  8:03 AM  Result Value Ref Range   Glucose-Capillary 114 (H) 65 - 99 mg/dL   Comment 1 Notify RN    Comment 2 Document in Chart   Glucose, capillary     Status: Abnormal   Collection Time:  02/03/17 11:46 AM  Result Value Ref Range   Glucose-Capillary 129 (H) 65 - 99 mg/dL   Comment 1 Notify RN    Comment 2 Document in Chart   Glucose, capillary     Status: None   Collection Time: 02/03/17  4:07 PM  Result Value Ref Range   Glucose-Capillary 91 65 - 99 mg/dL   Comment 1 Notify RN    Comment 2 Document in Chart   Glucose, capillary     Status: Abnormal   Collection Time: 02/03/17  8:56 PM  Result Value Ref Range   Glucose-Capillary 104 (H) 65 - 99 mg/dL  Comprehensive metabolic panel     Status: Abnormal   Collection Time: 02/04/17  5:15 AM  Result Value Ref Range   Sodium 139 135 - 145 mmol/L   Potassium 3.8 3.5 - 5.1 mmol/L   Chloride 107 101 - 111 mmol/L   CO2 24 22 - 32 mmol/L   Glucose, Bld 86 65 - 99 mg/dL   BUN <5 (L) 6 - 20 mg/dL   Creatinine, Ser 0.99 0.61 - 1.24 mg/dL   Calcium 8.3 (L) 8.9 - 10.3 mg/dL   Total Protein 5.4 (L) 6.5 - 8.1 g/dL   Albumin 2.7 (L) 3.5 - 5.0 g/dL   AST 30 15 - 41 U/L   ALT 76 (H) 17 - 63 U/L   Alkaline Phosphatase 96 38 - 126 U/L   Total Bilirubin 0.8 0.3 - 1.2 mg/dL   GFR calc non Af Amer >60 >60 mL/min   GFR calc Af Amer >60 >60 mL/min    Comment: (NOTE) The eGFR has been calculated using the CKD EPI equation. This calculation has not been validated in all clinical situations. eGFR's persistently <60 mL/min signify possible Chronic Kidney Disease.    Anion gap 8 5 - 15  Glucose, capillary     Status: None   Collection Time: 02/04/17  7:55 AM  Result Value Ref Range   Glucose-Capillary 82 65 - 99 mg/dL   Comment 1 Notify RN      Lipid Panel     Component Value Date/Time   CHOL 109 07/04/2016 0814   TRIG 68.0 07/04/2016 0814   HDL 45.90 07/04/2016 0814   CHOLHDL 2 07/04/2016 0814   VLDL 13.6 07/04/2016 0814   LDLCALC 50 07/04/2016 0814   LDLDIRECT 85.1 06/23/2008 1057     Lab Results  Component Value Date   HGBA1C 5.9 11/01/2016   HGBA1C 6.0 07/04/2016   HGBA1C 5.6 02/20/2016     Lab Results   Component Value Date   MICROALBUR 0.7 10/27/2015   LDLCALC 50 07/04/2016   CREATININE 0.99 02/04/2017     HPI :   82 y.o. male pmh of CAD s/p CABG 03/2011, DM2, htn, Gout, mild dementia, diverticulosis presented to the ED with complaints of nausea and vomiting x 2 days. Pt states symptoms began shortly after eating some candy and pistachios. He reports ~5 episodes of vomiting, he denies any diarrehea, melena or hematochezia. Pt reports similar symptoms a couple of weeks ago that resolved on there own, but this time had  chills so presented to ED. He describes some lower abdominal cramping, but denies any pain. He denies recent headache, dizziness, chest pain or sob.   ED COURSE In ED, BP as low as 85/47, Tmax 101.3. Initial lactic acid 5.8 a/w elevated LFTs.Liver function tests found to be grossly of normal T bili 2.2 >>3.2. Alk phos 175 >>127. AST/ALT 937/528 >>409/354.  CT ab/pelvis: suspect acute cholecystitis. "Slight" intrahepatic ductal dilatation. Mild porta hepatis lymphadenopathy felt to be reactive.Severe sigmoid diverticulosis with mild acute on chronic diverticulitis.  Ultrasound: 55m thick GB wall without stones/sludge. Mild intrahepatic biliary ductal dilatation. 6.3 MM CBD. Mild, bil, perinephric fluid. Ectatic, 2.9 cm abdominal aorta.   Blood cultures obtains. Pt received NS bolus and started on empiric vancomycin and zosyn.   HOSPITAL COURSE:    Transaminitis/Acalculous cholecystitis/sepsis secondary to klebsiella pneumonia bacteremia HIDA  No activity identified within the gallbladder after 2 hours of imaging. These findings are inconclusive for definite diagnosis of acute cholecystitis .HIDA showed no uptake into gallbladder so this could be a acalculous cholecystitis CT scan showed cholecystitis and sigmoid diverticulitis. ? Patient probably has Acalculous cholecystitis , could have passed CBD stone  Abx given include Zosyn, vanc: x 1 dose. Subsequently  changed to IV Zosyn, for 4 days, narrowed   to Rocephin. Discussed with infectious disease Dr. CMegan Salon Recommend cephalexin 500 mg 3 times a day for a total of 14 days of treatment Blood culture positive for Klebsiella pneumonia,  pansensitive, with the exception of ampicillin.  IR holding off on the drain for now after discussing with CCS, as the patient is slowly improving Diet advance from clear liquids to solid food which he tolerated well AST 30, ALT 76, total bilirubin 0.8, alkaline phosphatase 96, prior to discharge  Hx CAD s/p CABG Continue ASA    Hx DM2 Metformin discontinued, secondary to low normal CBG    Hx hypertension May resume low dose ACE inhibitor, in 1-2 weeks of blood pressure gets better. In the meantime continue metoprolol  Hx dementia -mild Resumed Aricept  Hx BPH Continue finasteride   Discharge Exam:   Blood pressure 121/63, pulse 63, temperature 97.8 F (36.6 C), temperature source Oral, resp. rate 18, height 5' 5"  (1.651 m), weight 79.3 kg (174 lb 12.8 oz), SpO2 93 %.  General exam: Appears calm and comfortable  Respiratory system: Clear to auscultation. Respiratory effort normal. Cardiovascular system: S1 & S2 heard, RRR. No JVD, murmurs, rubs, gallops or clicks. No pedal edema. Gastrointestinal system: Abdomen is nondistended, soft and nontender. No organomegaly or masses felt. Normal bowel sounds heard. Central nervous system: Alert and oriented. No focal neurological deficits. Extremities: Symmetric 5 x 5 power. Skin: No rashes, lesions or ulcers Psychiatry: Judgement and insight appear normal. Mood & affect appropriate.     Follow-up Information    WAKEFIELD,MATTHEW, MD. Schedule an appointment as soon as possible for a visit in 2 week(s).   Specialty:  General Surgery Why:  for follow up Contact information: [address] [place] [phone]          Signed: AReyne Dumas3/19/2018, 8:04 AM         Time spent >45 mins

## 2017-02-05 ENCOUNTER — Telehealth: Payer: Self-pay

## 2017-02-05 NOTE — Telephone Encounter (Signed)
Spoke with patient and wife.   Transition Care Management Follow-up Telephone Call   Date discharged? 02/04/17   How have you been since you were released from the hospital? "I'm doing good"   Do you understand why you were in the hospital? Yes, "I understand all that"   Do you understand the discharge instructions? yes   Where were you discharged to? Home.    Items Reviewed:  Medications reviewed: no, pt declined review at this time, will bring medications to appointment  Allergies reviewed: yes  Dietary changes reviewed: no changes  Referrals reviewed: yes   Functional Questionnaire:   Activities of Daily Living (ADLs):   He states they are independent in the following: ambulation, bathing and hygiene, feeding, continence, grooming, toileting and dressing States they require assistance with the following: None.    Any transportation issues/concerns?: no   Any patient concerns? no   Confirmed importance and date/time of follow-up visits scheduled yes  Provider Appointment booked with PCP on Friday, 02/08/17 @ 3:45.   Confirmed with patient if condition begins to worsen call PCP or go to the ER.  Patient was given the office number and encouraged to call back with question or concerns.  : yes

## 2017-02-05 NOTE — Telephone Encounter (Signed)
Noted. Reviewed d/c summary.  Signed:  Crissie Sickles, MD           02/05/2017

## 2017-02-08 ENCOUNTER — Encounter: Payer: Self-pay | Admitting: Family Medicine

## 2017-02-08 ENCOUNTER — Ambulatory Visit (INDEPENDENT_AMBULATORY_CARE_PROVIDER_SITE_OTHER): Payer: Medicare Other | Admitting: Family Medicine

## 2017-02-08 ENCOUNTER — Other Ambulatory Visit: Payer: Self-pay | Admitting: Family Medicine

## 2017-02-08 VITALS — BP 146/68 | HR 59 | Temp 97.6°F | Resp 16 | Ht 66.0 in | Wt 175.5 lb

## 2017-02-08 DIAGNOSIS — N182 Chronic kidney disease, stage 2 (mild): Secondary | ICD-10-CM

## 2017-02-08 DIAGNOSIS — K5792 Diverticulitis of intestine, part unspecified, without perforation or abscess without bleeding: Secondary | ICD-10-CM | POA: Diagnosis not present

## 2017-02-08 DIAGNOSIS — A414 Sepsis due to anaerobes: Secondary | ICD-10-CM

## 2017-02-08 DIAGNOSIS — D696 Thrombocytopenia, unspecified: Secondary | ICD-10-CM

## 2017-02-08 DIAGNOSIS — A4159 Other Gram-negative sepsis: Secondary | ICD-10-CM

## 2017-02-08 DIAGNOSIS — K810 Acute cholecystitis: Secondary | ICD-10-CM | POA: Diagnosis not present

## 2017-02-08 DIAGNOSIS — N179 Acute kidney failure, unspecified: Secondary | ICD-10-CM

## 2017-02-08 DIAGNOSIS — K7589 Other specified inflammatory liver diseases: Secondary | ICD-10-CM | POA: Diagnosis not present

## 2017-02-08 LAB — CBC WITH DIFFERENTIAL/PLATELET
Basophils Absolute: 0 cells/uL (ref 0–200)
Basophils Relative: 0 %
Eosinophils Absolute: 192 cells/uL (ref 15–500)
Eosinophils Relative: 3 %
HEMATOCRIT: 36.8 % — AB (ref 38.5–50.0)
Hemoglobin: 11.9 g/dL — ABNORMAL LOW (ref 13.2–17.1)
LYMPHS ABS: 2112 {cells}/uL (ref 850–3900)
Lymphocytes Relative: 33 %
MCH: 33.6 pg — ABNORMAL HIGH (ref 27.0–33.0)
MCHC: 32.3 g/dL (ref 32.0–36.0)
MCV: 104 fL — AB (ref 80.0–100.0)
MPV: 10.2 fL (ref 7.5–12.5)
Monocytes Absolute: 640 cells/uL (ref 200–950)
Monocytes Relative: 10 %
NEUTROS ABS: 3456 {cells}/uL (ref 1500–7800)
NEUTROS PCT: 54 %
Platelets: 227 10*3/uL (ref 140–400)
RBC: 3.54 MIL/uL — ABNORMAL LOW (ref 4.20–5.80)
RDW: 14.4 % (ref 11.0–15.0)
WBC: 6.4 10*3/uL (ref 3.8–10.8)

## 2017-02-08 LAB — COMPREHENSIVE METABOLIC PANEL
ALK PHOS: 99 U/L (ref 40–115)
ALT: 29 U/L (ref 9–46)
AST: 18 U/L (ref 10–35)
Albumin: 3.5 g/dL — ABNORMAL LOW (ref 3.6–5.1)
BUN: 8 mg/dL (ref 7–25)
CALCIUM: 8.7 mg/dL (ref 8.6–10.3)
CO2: 29 mmol/L (ref 20–31)
Chloride: 105 mmol/L (ref 98–110)
Creat: 1.03 mg/dL (ref 0.70–1.11)
GLUCOSE: 102 mg/dL — AB (ref 65–99)
POTASSIUM: 4.4 mmol/L (ref 3.5–5.3)
Sodium: 141 mmol/L (ref 135–146)
Total Bilirubin: 0.6 mg/dL (ref 0.2–1.2)
Total Protein: 6.2 g/dL (ref 6.1–8.1)

## 2017-02-08 NOTE — Progress Notes (Signed)
02/08/2017  CC:  Chief Complaint  Patient presents with  . Hospitalization Follow-up    TCM -     Patient is a 82 y.o. Caucasian male who presents for  hospital follow up, specifically Transitional Care Services face-to-face visit. Dates hospitalized: 3/14-3/19, 2018. Days since d/c from hospital: 4 Patient was discharged from hospital to home. Reason for admission to hospital: sepsis due to klebsiella, felt to be brought on by acute cholecystitis and acute diverticulitis. Pt had significantly elevated AST/ALT, Alk phos, T bili on admission, which trended down during hospitalization.  His urine clx was NEGATIVE. He also had mild acute-on-chronic renal insufficiency. He was d/c'd home on cephalexin and several meds were stopped due to his abnl LFTs. Date of interactive (phone) contact with patient and/or caregiver: 02/05/17.  I have reviewed patient's discharge summary plus pertinent specific notes, labs, and imaging from the hospitalization.    "I feel 100% better".  Eating fine, having soft/mushy bm qd.  No fevers.  No abd pains. Glucose this morning 105 (off metformin).  No home bp checks since he's been home. He got a lot of IVF in hosp so gained lots of wt: d/c wt was 174 and 3/4 lbs.  Says his waist feels expanded. Also says foreskin of penis is swollen and tends to adhere to head of penis lately--he is using vaseline to keep it from sticking.  No redness or pain. No abd pains.  No SOB.  No n/v/d.  No rash.  +dry mouth from keflex per pt.   Medication reconciliation was done today and patient is taking meds as recommended by discharging hospitalist/specialist.    PMH:  Past Medical History:  Diagnosis Date  . BPH with obstruction/lower urinary tract symptoms 10/27/2015   Dr. Karsten Ro  . Cataract   . Coronary artery disease   . Dementia   . Diabetes mellitus without complication ([place])   . Diverticulosis of colon    severe, entire colon  . Ectatic abdominal aorta ([place])  01/2017   Abd u/s: 2.9 cm aortic ectasia--at risk for aneurism development.  Recheck aortic u/s 5 yrs.  . Erectile dysfunction due to arterial insufficiency   . Fatigue   . Gout    always 2nd toe L foot (uric acid 6.20 Dec 2014 per old records)  . History of adenomatous polyp of colon 2002  . History of stomach ulcers   . Hyperlipidemia   . Hypertension   . Hypogonadism male   . Past use of tobacco    quit 1984  . Rectal bleeding   . Rosacea   . S/P coronary artery bypass graft x 3   . Urine incontinence    Dr. Karsten Ro    PSH:  Past Surgical History:  Procedure Laterality Date  . CARDIOVASCULAR STRESS TEST     Low risk myoview, normal EF, no ischemia.  Marland Kitchen CATARACT EXTRACTION, BILATERAL    . COLONOSCOPY  2002, 2006, 02/25/2009   Polyp x 1 2002, none 2006 or 2010  . CORONARY ARTERY BYPASS GRAFT  06/2009   descending,saphenous vein graft to first obtuse marginal, sequential saphenous vein graft to posterior descending and posterolateral  . Endoscopic vein harvest right thigh    . PTCA    . TONSILLECTOMY    . UMBILICAL HERNIA REPAIR     2009  . VASECTOMY      MEDS:  Outpatient Medications Prior to Visit  Medication Sig Dispense Refill  . allopurinol (ZYLOPRIM) 100 MG tablet TAKE ONE TABLET  BY MOUTH ON MONDAY, WEDNESDAY, AND FRIDAY 30 tablet 3  . ALPRAZolam (NIRAVAM) 0.25 MG dissolvable tablet Take 0.25 mg by mouth at bedtime as needed for anxiety.    Marland Kitchen aspirin 325 MG tablet Take 325 mg by mouth daily.      . benzonatate (TESSALON) 100 MG capsule Take 1 capsule (100 mg total) by mouth 2 (two) times daily as needed for cough. 20 capsule 0  . cephALEXin (KEFLEX) 500 MG capsule Take 1 capsule (500 mg total) by mouth every 8 (eight) hours. 36 capsule 0  . donepezil (ARICEPT ODT) 10 MG disintegrating tablet Take 1 tablet (10 mg total) by mouth at bedtime. 30 tablet 11  . finasteride (PROSCAR) 5 MG tablet TAKE ONE TABLET BY MOUTH DAILY 90 tablet 1  . metoprolol tartrate (LOPRESSOR)  25 MG tablet TAKE ONE TABLET BY MOUTH TWICE DAILY 180 tablet 1  . metroNIDAZOLE (METROCREAM) 0.75 % cream Apply 1 application topically 2 (two) times daily.    . MULTIPLE VITAMIN PO Take 1 tablet by mouth daily.     . ondansetron (ZOFRAN) 4 MG tablet Take 1 tablet (4 mg total) by mouth every 8 (eight) hours as needed for nausea or vomiting. 20 tablet 0  . oxybutynin (DITROPAN-XL) 10 MG 24 hr tablet TAKE ONE TABLET BY MOUTH DAILY 90 tablet 1  . tamsulosin (FLOMAX) 0.4 MG CAPS capsule TAKE ONE CAPSULE BY MOUTH DAILY 90 capsule 1  . Trospium Chloride 60 MG CP24 Take 1 capsule by mouth daily.  3  . nitroGLYCERIN (NITROSTAT) 0.4 MG SL tablet Place 1 tablet (0.4 mg total) under the tongue every 5 (five) minutes as needed for chest pain. 25 tablet 3   No facility-administered medications prior to visit.   EXAM: BP (!) 146/68 (BP Location: Left Arm, Patient Position: Sitting, Cuff Size: Normal)   Pulse (!) 59   Temp 97.6 F (36.4 C) (Oral)   Resp 16   Ht 5' 6" (1.676 m)   Wt 175 lb 8 oz (79.6 kg)   SpO2 99%   BMI 28.33 kg/m  Gen: Alert, well appearing.  Patient is oriented to person, place, time, and situation. AFFECT: pleasant, lucid thought and speech. TIW:PYKD: no injection, icteris, swelling, or exudate.  EOMI, PERRLA. Mouth: lips without lesion/swelling.  Oral mucosa pink and moist. Oropharynx without erythema, exudate, or swelling.  Neck - No masses or thyromegaly or limitation in range of motion CV: RRR, no m/r/g.   LUNGS: CTA bilat, nonlabored resps, good aeration in all lung fields. ABD: soft, NT, ND, BS normal.  No hepatospenomegaly or mass.  No bruits. EXT: no clubbing or cyanosis.  3+ pitting in both LL's.   Pertinent labs/imaging  01/31/17 CT abd/pelv and abd ultrasound: CT ab/pelvis: suspect acute cholecystitis. "Slight" intrahepatic ductal dilatation. Mild porta hepatis lymphadenopathy felt to be reactive.Severe sigmoid diverticulosis with mild acute on chronic  diverticulitis.  Ultrasound: 33m thick GB wall without stones/sludge. Mild intrahepatic biliary ductal dilatation. 6.3 MM CBD. Mild, bil, perinephric fluid. Ectatic, 2.9 cm abdominal aorta.    UA 01/31/17 showed no protein.    Chemistry      Component Value Date/Time   NA 139 02/04/2017 0515   K 3.8 02/04/2017 0515   CL 107 02/04/2017 0515   CO2 24 02/04/2017 0515   BUN <5 (L) 02/04/2017 0515   CREATININE 0.99 02/04/2017 0515      Component Value Date/Time   CALCIUM 8.3 (L) 02/04/2017 0515   ALKPHOS 96 02/04/2017 0515  AST 30 02/04/2017 0515   ALT 76 (H) 02/04/2017 0515   BILITOT 0.8 02/04/2017 0515     Lab Results  Component Value Date   WBC 7.4 02/03/2017   HGB 11.2 (L) 02/03/2017   HCT 33.1 (L) 02/03/2017   MCV 99.4 02/03/2017   PLT 117 (L) 02/03/2017    ASSESSMENT/PLAN:  1) Klebsiella pneumoniae sepsis; source suspected to be biliary tract infection (no stones) and acute on chronic diverticulitis. Urine normal.  Per pt report, they called for gen surg appt and were told that it was not necessary for him to see surgeon. He'll finish 10d course of keflex post-discharge. He is doing well at this time.  2) Elevated liver tests, low protein/albumin: improved over the course of hospitalization. This was secondary to his biliary infection.  His metformin, indomethacin, lisinopril, and rosuvastatin were stopped due to this liver dysfunction.  Recheck CMET today.  3) Acute-on-chronic renal insufficiency: resolved in hosp with IVF. Recheck lytes/cr today.  4) Edema/wt gain: this is a combination of hypoalbuminemia lately + excessive IVF in hosp, plus he drinks high sodium drinks at home.  Expect this to resolve as liver recovers.  No protein in urine when tested in hospital. Advised him to stop excessive Na intake, elevate legs prn.  He'll continue to use vaseline around glans/prepuce to prevent phimoses.  Medical decision making of high complexity was utilized  today.  FOLLOW UP:  Keep appt already set for 03/08/17.  Signed:  Crissie Sickles, MD           02/08/2017

## 2017-02-08 NOTE — Progress Notes (Signed)
Pre visit review using our clinic review tool, if applicable. No additional management support is needed unless otherwise documented below in the visit note. 

## 2017-02-11 ENCOUNTER — Encounter: Payer: Self-pay | Admitting: Family Medicine

## 2017-02-11 ENCOUNTER — Encounter: Payer: Self-pay | Admitting: *Deleted

## 2017-02-11 LAB — IRON AND TIBC
%SAT: 12 % — AB (ref 15–60)
Iron: 37 ug/dL — ABNORMAL LOW (ref 50–180)
TIBC: 308 ug/dL (ref 250–425)
UIBC: 271 ug/dL (ref 125–400)

## 2017-02-11 LAB — FERRITIN: FERRITIN: 46 ng/mL (ref 20–380)

## 2017-02-11 LAB — VITAMIN B12: VITAMIN B 12: 210 pg/mL (ref 200–1100)

## 2017-02-18 ENCOUNTER — Ambulatory Visit: Payer: Medicare Other | Admitting: Family Medicine

## 2017-02-20 DIAGNOSIS — L602 Onychogryphosis: Secondary | ICD-10-CM | POA: Diagnosis not present

## 2017-02-20 DIAGNOSIS — L84 Corns and callosities: Secondary | ICD-10-CM | POA: Diagnosis not present

## 2017-02-20 DIAGNOSIS — E1351 Other specified diabetes mellitus with diabetic peripheral angiopathy without gangrene: Secondary | ICD-10-CM | POA: Diagnosis not present

## 2017-02-21 DIAGNOSIS — C44329 Squamous cell carcinoma of skin of other parts of face: Secondary | ICD-10-CM | POA: Diagnosis not present

## 2017-02-21 DIAGNOSIS — D0439 Carcinoma in situ of skin of other parts of face: Secondary | ICD-10-CM | POA: Diagnosis not present

## 2017-02-22 ENCOUNTER — Other Ambulatory Visit: Payer: Self-pay | Admitting: Family Medicine

## 2017-02-22 ENCOUNTER — Other Ambulatory Visit (INDEPENDENT_AMBULATORY_CARE_PROVIDER_SITE_OTHER): Payer: Medicare Other

## 2017-02-22 DIAGNOSIS — Z1211 Encounter for screening for malignant neoplasm of colon: Secondary | ICD-10-CM

## 2017-02-23 ENCOUNTER — Other Ambulatory Visit: Payer: Self-pay | Admitting: Family Medicine

## 2017-02-25 LAB — HEMOCCULT SLIDES (X 3 CARDS)
FECAL OCCULT BLD: NEGATIVE
OCCULT 1: NEGATIVE
OCCULT 2: NEGATIVE
OCCULT 3: NEGATIVE
OCCULT 4: NEGATIVE
OCCULT 5: NEGATIVE

## 2017-03-08 ENCOUNTER — Ambulatory Visit (INDEPENDENT_AMBULATORY_CARE_PROVIDER_SITE_OTHER): Payer: Medicare Other | Admitting: Family Medicine

## 2017-03-08 ENCOUNTER — Encounter: Payer: Self-pay | Admitting: Family Medicine

## 2017-03-08 VITALS — BP 106/59 | HR 57 | Temp 97.3°F | Resp 16 | Ht 66.0 in | Wt 165.8 lb

## 2017-03-08 DIAGNOSIS — E119 Type 2 diabetes mellitus without complications: Secondary | ICD-10-CM | POA: Diagnosis not present

## 2017-03-08 DIAGNOSIS — D539 Nutritional anemia, unspecified: Secondary | ICD-10-CM | POA: Diagnosis not present

## 2017-03-08 DIAGNOSIS — E7800 Pure hypercholesterolemia, unspecified: Secondary | ICD-10-CM | POA: Diagnosis not present

## 2017-03-08 DIAGNOSIS — E611 Iron deficiency: Secondary | ICD-10-CM

## 2017-03-08 DIAGNOSIS — I10 Essential (primary) hypertension: Secondary | ICD-10-CM

## 2017-03-08 DIAGNOSIS — E538 Deficiency of other specified B group vitamins: Secondary | ICD-10-CM

## 2017-03-08 DIAGNOSIS — Z0000 Encounter for general adult medical examination without abnormal findings: Secondary | ICD-10-CM

## 2017-03-08 LAB — CBC WITH DIFFERENTIAL/PLATELET
BASOS ABS: 0.1 10*3/uL (ref 0.0–0.1)
Basophils Relative: 0.9 % (ref 0.0–3.0)
Eosinophils Absolute: 0.2 10*3/uL (ref 0.0–0.7)
Eosinophils Relative: 2.8 % (ref 0.0–5.0)
HCT: 39.9 % (ref 39.0–52.0)
Hemoglobin: 13.3 g/dL (ref 13.0–17.0)
LYMPHS ABS: 2.2 10*3/uL (ref 0.7–4.0)
Lymphocytes Relative: 26.1 % (ref 12.0–46.0)
MCHC: 33.4 g/dL (ref 30.0–36.0)
MCV: 103 fl — ABNORMAL HIGH (ref 78.0–100.0)
MONO ABS: 0.8 10*3/uL (ref 0.1–1.0)
Monocytes Relative: 9.3 % (ref 3.0–12.0)
NEUTROS PCT: 60.9 % (ref 43.0–77.0)
Neutro Abs: 5 10*3/uL (ref 1.4–7.7)
Platelets: 172 10*3/uL (ref 150.0–400.0)
RBC: 3.88 Mil/uL — AB (ref 4.22–5.81)
RDW: 14 % (ref 11.5–15.5)
WBC: 8.3 10*3/uL (ref 4.0–10.5)

## 2017-03-08 LAB — IRON AND TIBC
%SAT: 24 % (ref 15–60)
IRON: 76 ug/dL (ref 50–180)
TIBC: 316 ug/dL (ref 250–425)
UIBC: 240 ug/dL (ref 125–400)

## 2017-03-08 LAB — VITAMIN B12: Vitamin B-12: 539 pg/mL (ref 211–911)

## 2017-03-08 LAB — IRON: Iron: 70 ug/dL (ref 42–165)

## 2017-03-08 LAB — FERRITIN: Ferritin: 55 ng/mL (ref 22.0–322.0)

## 2017-03-08 NOTE — Progress Notes (Signed)
Subjective:   Ricardo Washington is a 82 y.o. male who presents for Medicare Annual/Subsequent preventive examination.  Review of Systems:  No ROS.  Medicare Wellness Visit.  Cardiac Risk Factors include: advanced age (>57mn, >>24women);dyslipidemia;hypertension;male gender   Sleep patterns: Sleeps 8 hours, up to void occasionally.  Home Safety/Smoke Alarms: Smoke detectors and security in place.  Living environment; residence and Firearm Safety: Lives with wife in 1 story home, steps at door with rail. Feels safe in home.  Seat Belt Safety/Bike Helmet: Wears seat belt.   Counseling:   Eye Exam-Last exam 10/2016, [place] 6 [place] [facility] [place], patient has not been seen since 12/15/2015.  Dental-Last exam < 6 months, every 6 months, Dr. DLavonne Chick  Male:   CCS-Colonoscopy 02/25/2009, Diverticulosis. Recall 10 years.      PSA-Followed by [facility]     Objective:    Vitals: BP (!) 106/59 (BP Location: Left Arm, Patient Position: Sitting, Cuff Size: Normal)   Pulse (!) 57   Temp 97.3 F (36.3 C) (Oral)   Resp 16   Ht 5' 6"  (1.676 m)   Wt 165 lb 12 oz (75.2 kg)   SpO2 98%   BMI 26.75 kg/m   Body mass index is 26.75 kg/m.  Tobacco History  Smoking Status  . Former Smoker  . Packs/day: 1.00  . Years: 30.00  . Types: Cigarettes  . Quit date: 03/15/1983  Smokeless Tobacco  . Never Used    Comment: Quit 1984     Counseling given: No   Past Medical History:  Diagnosis Date  . BPH with obstruction/lower urinary tract symptoms 10/27/2015   Dr. [place]sten Ro . Cataract   . Coronary artery disease   . Dementia   . [place]   . Diverticulosis of colon    severe, entire colon  . [place] 01[place]   Abd u/s: 2.9 cm aortic ectasia--at risk for aneurism development.  Recheck aortic u/s 5 yrs.  . Erectile dysfunction due to arterial insufficiency   . Fatigue   . Gout    always 2nd  toe L foot (uric acid 6.20 Dec 2014 per old records)  . History of adenomatous polyp of colon 2002  . History of stomach ulcers   . Hyperlipidemia   . Hypertension   . Hypogonadism male   . [place] 01[place]2017   due to acute biliary tract infection (no stones) and acute diverticulitis.  . Macrocytic anemia 01/2017   vit B12 borderline low and iron borderline low: checking hemoccults and starting vit B12 PO and iron PO as of 02/11/17.  .Marland KitchenPast use of tobacco    quit 1984  . Rectal bleeding   . Rosacea   . S/P coronary artery bypass graft x 3   . Urine incontinence    Dr. OKarsten Ro  Past Surgical History:  Procedure Laterality Date  . CARDIOVASCULAR STRESS TEST     Low risk myoview, normal EF, no ischemia.  .Marland KitchenCATARACT EXTRACTION, BILATERAL    . COLONOSCOPY  2002, 2006, 02/25/2009   Polyp x 1 2002, none 2006 or 2010  . CORONARY ARTERY BYPASS GRAFT  06/2009   descending,saphenous vein graft to first obtuse marginal, sequential saphenous vein graft to posterior descending and posterolateral  . Endoscopic vein harvest right thigh    . PTCA    . TONSILLECTOMY    . UMBILICAL HERNIA REPAIR  2009  . VASECTOMY     Family History  Problem Relation Age of Onset  . Cancer Mother   . Cancer Father     pt points to LLQ as  area of cancer, so potentially could have been intestinal.      History  Sexual Activity  . Sexual activity: No    Outpatient Encounter Prescriptions as of 03/08/2017  Medication Sig  . allopurinol (ZYLOPRIM) 100 MG tablet TAKE ONE TABLET BY MOUTH ON MONDAY, WEDNESDAY, AND FRIDAY  . ALPRAZolam (NIRAVAM) 0.25 MG dissolvable tablet Take 0.25 mg by mouth at bedtime as needed for anxiety.  Marland Kitchen aspirin 325 MG tablet Take 325 mg by mouth daily.    Marland Kitchen donepezil (ARICEPT ODT) 10 MG disintegrating tablet Take 1 tablet (10 mg total) by mouth at bedtime.  . ferrous sulfate 325 (65 FE) MG EC tablet Take 325 mg by mouth daily with breakfast.  . finasteride (PROSCAR) 5  MG tablet TAKE ONE TABLET BY MOUTH DAILY  . indomethacin (INDOCIN) 25 MG capsule Take 25 mg by mouth 2 (two) times daily as needed.  Marland Kitchen lisinopril (PRINIVIL,ZESTRIL) 5 MG tablet Take 5 mg by mouth daily.  . metFORMIN (GLUCOPHAGE-XR) 500 MG 24 hr tablet Take 500 mg by mouth 2 (two) times daily.  . metoprolol tartrate (LOPRESSOR) 25 MG tablet TAKE ONE TABLET BY MOUTH TWICE DAILY  . MULTIPLE VITAMIN PO Take 1 tablet by mouth daily.   Marland Kitchen oxybutynin (DITROPAN-XL) 10 MG 24 hr tablet TAKE ONE TABLET BY MOUTH DAILY  . rosuvastatin (CRESTOR) 10 MG tablet Take 10 mg by mouth daily.  . tamsulosin (FLOMAX) 0.4 MG CAPS capsule TAKE ONE CAPSULE BY MOUTH DAILY  . Trospium Chloride 60 MG CP24 Take 1 capsule by mouth daily.  . vitamin B-12 (CYANOCOBALAMIN) 1000 MCG tablet Take 1,000 mcg by mouth daily.  . metroNIDAZOLE (METROCREAM) 0.75 % cream Apply 1 application topically 2 (two) times daily.  . nitroGLYCERIN (NITROSTAT) 0.4 MG SL tablet Place 1 tablet (0.4 mg total) under the tongue every 5 (five) minutes as needed for chest pain.  . [DISCONTINUED] benzonatate (TESSALON) 100 MG capsule Take 1 capsule (100 mg total) by mouth 2 (two) times daily as needed for cough. (Patient not taking: Reported on 03/08/2017)  . [DISCONTINUED] ondansetron (ZOFRAN) 4 MG tablet Take 1 tablet (4 mg total) by mouth every 8 (eight) hours as needed for nausea or vomiting. (Patient not taking: Reported on 03/08/2017)   No facility-administered encounter medications on file as of 03/08/2017.     Activities of Daily Living In your present state of health, do you have any difficulty performing the following activities: 03/08/2017 01/31/2017  Hearing? N Y  Vision? N Y  Difficulty concentrating or making decisions? N Y  Walking or climbing stairs? N N  Dressing or bathing? N N  Doing errands, shopping? N N  Preparing Food and eating ? N -  Using the Toilet? N -  In the past six months, have you accidently leaked urine? N -  Do you have  problems with loss of bowel control? N -  Managing your Medications? N -  Managing your Finances? N -  Housekeeping or managing your Housekeeping? N -  Some recent data might be hidden    Patient Care Team: [name], MD as PCP - General (Family Medicine) Josue Hector, MD (Cardiology) Kathie Rhodes, MD as Consulting Physician (Urology) Lavonna Monarch, MD as Consulting Physician (Dermatology)   Assessment:    Physical assessment  deferred to PCP.  Exercise Activities and Dietary recommendations Current Exercise Habits: The patient does not participate in regular exercise at present (shopping, out for meals), Exercise limited by: None identified   Diet (meal preparation, eat out, water intake, caffeinated beverages, dairy products, fruits and vegetables): Drinks diet soda and coffee.  Breakfast: cereal, fruit, muffin, coffee Lunch: sandwich, soup, salad, grits, eggs, chicken nuggets Dinner: vegetables, protein     Discussed heart healthy diet. Encouraged to remain/increase activity.   Goals    . Increase physical activity          Increase activity by walking more.       Fall Risk Fall Risk  03/08/2017 10/02/2016 04/27/2015  Falls in the past year? No No No   Depression Screen PHQ 2/9 Scores 03/08/2017 10/02/2016 04/27/2015  PHQ - 2 Score 0 0 0    Cognitive Function MMSE - Mini Mental State Exam 03/08/2017  Orientation to time 5  Orientation to Place 5  Registration 3  Attention/ Calculation 4  Recall 0  Language- name 2 objects 2  Language- repeat 1  Language- follow 3 step command 3  Language- read & follow direction 1  Write a sentence 1  Copy design 1  Total score 26        Immunization History  Administered Date(s) Administered  . Influenza Split 07/30/2012  . Influenza Whole 07/27/2011  . Influenza, High Dose Seasonal PF 08/10/2016  . Influenza,inj,Quad PF,36+ Mos 08/24/2015  . Influenza-Unspecified 08/19/2013  . Pneumococcal Conjugate-13  04/27/2015  . Pneumococcal Polysaccharide-23 07/02/2016  . Td 08/05/2012   Screening Tests Health Maintenance  Topic Date Due  . URINE MICROALBUMIN  10/26/2016  . OPHTHALMOLOGY EXAM  12/14/2016  . HEMOGLOBIN A1C  05/02/2017  . INFLUENZA VACCINE  06/19/2017  . FOOT EXAM  11/01/2017  . TETANUS/TDAP  08/05/2022  . PNA vac Low Risk Adult  Completed   Eye clinic contacted for report of last exam, last visit was 12/15/2015. Will remind pt to make appt.     Plan:      Bring a copy of your advance directives to your next office visit.  Continue doing brain stimulating activities (puzzles, reading, adult coloring books, staying active) to keep memory sharp.   Continue to eat heart healthy diet (full of fruits, vegetables, whole grains, lean protein, water--limit salt, fat, and sugar intake) and increase physical activity as tolerated.  During the course of the visit the patient was educated and counseled about the following appropriate screening and preventive services:   Vaccines to include Pneumoccal, Influenza, Hepatitis B, Td, Zostavax, HCV  Cardiovascular Disease  Colorectal cancer screening  Diabetes screening  Prostate Cancer Screening  Glaucoma screening  Nutrition counseling    Patient Instructions (the written plan) was given to the patient.    Gerilyn Nestle, RN  03/08/2017

## 2017-03-08 NOTE — Progress Notes (Signed)
Pre visit review using our clinic review tool, if applicable. No additional management support is needed unless otherwise documented below in the visit note. 

## 2017-03-08 NOTE — Progress Notes (Signed)
OFFICE VISIT  03/08/2017   CC:  Chief Complaint  Patient presents with  . Follow-up    RCI, pt is not fasting.  . Medicare Wellness   HPI:    Patient is a 82 y.o. Caucasian male who presents for 4 mo f/u DM 2, HTN, HLD. Recent dx mild macrocytic anemia: 01/2017 started PO vit B12 and iron PO. He says the iron is making him have good, normal BMs.    Patient states he DID restart meds that were stopped at recent hospitalization.  This is correct b/c his f/u labs after d/c were back to normal.  HTN: occ bp monitoring 120/60s.  Denies dizziness or generalized fatigue.  He drinks lots of diet sprite, rarely drinks water.  DM 2: once a week fasting glucose check : low 100s. Eats pretty good diabetic diet.  HLD: tolerating rosuvastatin.  No side effects.  Past Medical History:  Diagnosis Date  . BPH with obstruction/lower urinary tract symptoms 10/27/2015   Dr. Karsten Ro  . Cataract   . Coronary artery disease   . Dementia   . Diabetes mellitus without complication ([place])   . Diverticulosis of colon    severe, entire colon  . Ectatic abdominal aorta ([place]) 01/2017   Abd u/s: 2.9 cm aortic ectasia--at risk for aneurism development.  Recheck aortic u/s 5 yrs.  . Erectile dysfunction due to arterial insufficiency   . Fatigue   . Gout    always 2nd toe L foot (uric acid 6.20 Dec 2014 per old records)  . History of adenomatous polyp of colon 2002  . History of stomach ulcers   . Hyperlipidemia   . Hypertension   . Hypogonadism male   . Klebsiella sepsis ([place]) 01/2017   due to acute biliary tract infection (no stones) and acute diverticulitis.  . Macrocytic anemia 01/2017   vit B12 borderline low and iron borderline low: checking hemoccults and starting vit B12 PO and iron PO as of 02/11/17.  Marland Kitchen Past use of tobacco    quit 1984  . Rectal bleeding   . Rosacea   . S/P coronary artery bypass graft x 3   . Urine incontinence    Dr. Karsten Ro    Past Surgical History:  Procedure  Laterality Date  . CARDIOVASCULAR STRESS TEST     Low risk myoview, normal EF, no ischemia.  Marland Kitchen CATARACT EXTRACTION, BILATERAL    . COLONOSCOPY  2002, 2006, 02/25/2009   Polyp x 1 2002, none 2006 or 2010  . CORONARY ARTERY BYPASS GRAFT  06/2009   descending,saphenous vein graft to first obtuse marginal, sequential saphenous vein graft to posterior descending and posterolateral  . Endoscopic vein harvest right thigh    . PTCA    . TONSILLECTOMY    . UMBILICAL HERNIA REPAIR     2009  . VASECTOMY      Outpatient Medications Prior to Visit  Medication Sig Dispense Refill  . allopurinol (ZYLOPRIM) 100 MG tablet TAKE ONE TABLET BY MOUTH ON MONDAY, WEDNESDAY, AND FRIDAY 30 tablet 11  . ALPRAZolam (NIRAVAM) 0.25 MG dissolvable tablet Take 0.25 mg by mouth at bedtime as needed for anxiety.    Marland Kitchen aspirin 325 MG tablet Take 325 mg by mouth daily.      Marland Kitchen donepezil (ARICEPT ODT) 10 MG disintegrating tablet Take 1 tablet (10 mg total) by mouth at bedtime. 30 tablet 11  . finasteride (PROSCAR) 5 MG tablet TAKE ONE TABLET BY MOUTH DAILY 90 tablet 1  . metoprolol  tartrate (LOPRESSOR) 25 MG tablet TAKE ONE TABLET BY MOUTH TWICE DAILY 180 tablet 1  . MULTIPLE VITAMIN PO Take 1 tablet by mouth daily.     Marland Kitchen oxybutynin (DITROPAN-XL) 10 MG 24 hr tablet TAKE ONE TABLET BY MOUTH DAILY 90 tablet 1  . tamsulosin (FLOMAX) 0.4 MG CAPS capsule TAKE ONE CAPSULE BY MOUTH DAILY 90 capsule 1  . Trospium Chloride 60 MG CP24 Take 1 capsule by mouth daily.  3  . metroNIDAZOLE (METROCREAM) 0.75 % cream Apply 1 application topically 2 (two) times daily.    . nitroGLYCERIN (NITROSTAT) 0.4 MG SL tablet Place 1 tablet (0.4 mg total) under the tongue every 5 (five) minutes as needed for chest pain. 25 tablet 3  . benzonatate (TESSALON) 100 MG capsule Take 1 capsule (100 mg total) by mouth 2 (two) times daily as needed for cough. (Patient not taking: Reported on 03/08/2017) 20 capsule 0  . ondansetron (ZOFRAN) 4 MG tablet Take 1  tablet (4 mg total) by mouth every 8 (eight) hours as needed for nausea or vomiting. (Patient not taking: Reported on 03/08/2017) 20 tablet 0   No facility-administered medications prior to visit.     Allergies  Allergen Reactions  . Sulfonamide Derivatives     Unknown, per pt and "cant stand it"   ROS As per HPI  PE: Blood pressure (!) 106/59, pulse (!) 57, temperature 97.3 F (36.3 C), temperature source Oral, resp. rate 16, height 5' 6"  (1.676 m), weight 165 lb 12 oz (75.2 kg), SpO2 98 %. Gen: Alert, well appearing.  Patient is oriented to person, place, time, and situation. AFFECT: pleasant, lucid thought and speech. CV: RRR, no m/r/g.   LUNGS: CTA bilat, nonlabored resps, good aeration in all lung fields. ABD: soft, NT/ND EXT: no clubbing, cyanosis, or edema.   LABS:  Lab Results  Component Value Date   VITAMINB12 210 02/08/2017   Lab Results  Component Value Date   IRON 37 (L) 02/08/2017   TIBC 308 02/08/2017   FERRITIN 46 02/08/2017    Lab Results  Component Value Date   TSH 4.19 04/27/2015   Lab Results  Component Value Date   WBC 6.4 02/08/2017   HGB 11.9 (L) 02/08/2017   HCT 36.8 (L) 02/08/2017   MCV 104.0 (H) 02/08/2017   PLT 227 02/08/2017   Lab Results  Component Value Date   CREATININE 1.03 02/08/2017   BUN 8 02/08/2017   NA 141 02/08/2017   K 4.4 02/08/2017   CL 105 02/08/2017   CO2 29 02/08/2017   Lab Results  Component Value Date   ALT 29 02/08/2017   AST 18 02/08/2017   ALKPHOS 99 02/08/2017   BILITOT 0.6 02/08/2017   Lab Results  Component Value Date   CHOL 109 07/04/2016   Lab Results  Component Value Date   HDL 45.90 07/04/2016   Lab Results  Component Value Date   LDLCALC 50 07/04/2016   Lab Results  Component Value Date   TRIG 68.0 07/04/2016   Lab Results  Component Value Date   CHOLHDL 2 07/04/2016   Lab Results  Component Value Date   HGBA1C 5.9 11/01/2016   IMPRESSION AND PLAN:  1) Mild macrocytic  anemia: both iron and vit B12 were slightly low.  He has been on ferrous sulfate 334m qd and 1000 mcg vit B12 PO for about 1 mo. Recheck CBC, iron labs, b12 level today.  2) DM 2: excellent control per home monitoring. HbA1c today.  Updated med list to reflect that he is back on his metformin.  3) Hyperlipidemia: back on rosuvastatin 36m qd and tolerating this well.  Plan to recheck FLP at next f/u in 3 mo.  4) HTN: The current medical regimen is effective;  continue present plan and medications. Lytes/cr back to baseline less than a month ago. We'll recheck CMET at next f/u in 3 mo.  An After Visit Summary was printed and given to the patient.  FOLLOW UP: Return in about 3 months (around 06/07/2017) for routine chronic illness f/u.  Signed:  PCrissie Sickles MD           03/08/2017

## 2017-03-08 NOTE — Patient Instructions (Addendum)
Bring a copy of your advance directives to your next office visit.  Continue doing brain stimulating activities (puzzles, reading, adult coloring books, staying active) to keep memory sharp.   Continue to eat heart healthy diet (full of fruits, vegetables, whole grains, lean protein, water--limit salt, fat, and sugar intake) and increase physical activity as tolerated.  Health Maintenance, Male A healthy lifestyle and preventive care is important for your health and wellness. Ask your health care provider about what schedule of regular examinations is right for you. What should I know about weight and diet?  Eat a Healthy Diet  Eat plenty of vegetables, fruits, whole grains, low-fat dairy products, and lean protein.  Do not eat a lot of foods high in solid fats, added sugars, or salt. Maintain a Healthy Weight  Regular exercise can help you achieve or maintain a healthy weight. You should:  Do at least 150 minutes of exercise each week. The exercise should increase your heart rate and make you sweat (moderate-intensity exercise).  Do strength-training exercises at least twice a week. Watch Your Levels of Cholesterol and Blood Lipids  Have your blood tested for lipids and cholesterol every 5 years starting at 80 years of age. If you are at high risk for heart disease, you should start having your blood tested when you are 80 years old. You may need to have your cholesterol levels checked more often if:  Your lipid or cholesterol levels are high.  You are older than 80 years of age.  You are at high risk for heart disease. What should I know about cancer screening? Many types of cancers can be detected early and may often be prevented. Lung Cancer  You should be screened every year for lung cancer if:  You are a current smoker who has smoked for at least 30 years.  You are a former smoker who has quit within the past 15 years.  Talk to your health care provider about your  screening options, when you should start screening, and how often you should be screened. Colorectal Cancer  Routine colorectal cancer screening usually begins at 80 years of age and should be repeated every 5-10 years until you are 80 years old. You may need to be screened more often if early forms of precancerous polyps or small growths are found. Your health care provider may recommend screening at an earlier age if you have risk factors for colon cancer.  Your health care provider may recommend using home test kits to check for hidden blood in the stool.  A small camera at the end of a tube can be used to examine your colon (sigmoidoscopy or colonoscopy). This checks for the earliest forms of colorectal cancer. Prostate and Testicular Cancer  Depending on your age and overall health, your health care provider may do certain tests to screen for prostate and testicular cancer.  Talk to your health care provider about any symptoms or concerns you have about testicular or prostate cancer. Skin Cancer  Check your skin from head to toe regularly.  Tell your health care provider about any new moles or changes in moles, especially if:  There is a change in a mole's size, shape, or color.  You have a mole that is larger than a pencil eraser.  Always use sunscreen. Apply sunscreen liberally and repeat throughout the day.  Protect yourself by wearing long sleeves, pants, a wide-brimmed hat, and sunglasses when outside. What should I know about heart disease, diabetes, and high  blood pressure?  If you are 55-60 years of age, have your blood pressure checked every 3-5 years. If you are 68 years of age or older, have your blood pressure checked every year. You should have your blood pressure measured twice-once when you are at a hospital or clinic, and once when you are not at a hospital or clinic. Record the average of the two measurements. To check your blood pressure when you are not at a  hospital or clinic, you can use:  An automated blood pressure machine at a pharmacy.  A home blood pressure monitor.  Talk to your health care provider about your target blood pressure.  If you are between 90-70 years old, ask your health care provider if you should take aspirin to prevent heart disease.  Have regular diabetes screenings by checking your fasting blood sugar level.  If you are at a normal weight and have a low risk for diabetes, have this test once every three years after the age of 48.  If you are overweight and have a high risk for diabetes, consider being tested at a younger age or more often.  A one-time screening for abdominal aortic aneurysm (AAA) by ultrasound is recommended for men aged 62-75 years who are current or former smokers. What should I know about preventing infection? Hepatitis B  If you have a higher risk for hepatitis B, you should be screened for this virus. Talk with your health care provider to find out if you are at risk for hepatitis B infection. Hepatitis C  Blood testing is recommended for:  Everyone born from [birth year] through 1963.  Anyone with known risk factors for hepatitis C. Sexually Transmitted Diseases (STDs)  You should be screened each year for STDs including gonorrhea and chlamydia if:  You are sexually active and are younger than 80 years of age.  You are older than 80 years of age and your health care provider tells you that you are at risk for this type of infection.  Your sexual activity has changed since you were last screened and you are at an increased risk for chlamydia or gonorrhea. Ask your health care provider if you are at risk.  Talk with your health care provider about whether you are at high risk of being infected with HIV. Your health care provider may recommend a prescription medicine to help prevent HIV infection. What else can I do?  Schedule regular health, dental, and eye exams.  Stay current with your  vaccines (immunizations).  Do not use any tobacco products, such as cigarettes, chewing tobacco, and e-cigarettes. If you need help quitting, ask your health care provider.  Limit alcohol intake to no more than 2 drinks per day. One drink equals 12 ounces of beer, 5 ounces of wine, or 1 ounces of hard liquor.  Do not use street drugs.  Do not share needles.  Ask your health care provider for help if you need support or information about quitting drugs.  Tell your health care provider if you often feel depressed.  Tell your health care provider if you have ever been abused or do not feel safe at home. This information is not intended to replace advice given to you by your health care provider. Make sure you discuss any questions you have with your health care provider. Document Released: 05/03/2008 Document Revised: 07/04/2016 Document Reviewed: 08/09/2015 Elsevier Interactive Patient Education  2017 [employer].

## 2017-03-08 NOTE — Progress Notes (Signed)
AWV reviewed and agree.  Signed:  Crissie Sickles, MD           03/08/2017

## 2017-03-11 ENCOUNTER — Encounter: Payer: Self-pay | Admitting: *Deleted

## 2017-03-18 DIAGNOSIS — H0279 Other degenerative disorders of eyelid and periocular area: Secondary | ICD-10-CM | POA: Diagnosis not present

## 2017-03-18 DIAGNOSIS — H02831 Dermatochalasis of right upper eyelid: Secondary | ICD-10-CM | POA: Diagnosis not present

## 2017-03-18 DIAGNOSIS — H02032 Senile entropion of right lower eyelid: Secondary | ICD-10-CM | POA: Diagnosis not present

## 2017-03-18 DIAGNOSIS — H53483 Generalized contraction of visual field, bilateral: Secondary | ICD-10-CM | POA: Diagnosis not present

## 2017-03-18 DIAGNOSIS — H02422 Myogenic ptosis of left eyelid: Secondary | ICD-10-CM | POA: Diagnosis not present

## 2017-03-18 DIAGNOSIS — H02421 Myogenic ptosis of right eyelid: Secondary | ICD-10-CM | POA: Diagnosis not present

## 2017-03-18 DIAGNOSIS — H02413 Mechanical ptosis of bilateral eyelids: Secondary | ICD-10-CM | POA: Diagnosis not present

## 2017-03-18 DIAGNOSIS — H02002 Unspecified entropion of right lower eyelid: Secondary | ICD-10-CM | POA: Diagnosis not present

## 2017-03-31 ENCOUNTER — Encounter: Payer: Self-pay | Admitting: Family Medicine

## 2017-04-04 ENCOUNTER — Other Ambulatory Visit: Payer: Self-pay | Admitting: *Deleted

## 2017-04-04 NOTE — Telephone Encounter (Deleted)
[facility].  RF request for One Touch ultrasoft lancets.

## 2017-04-04 NOTE — Telephone Encounter (Signed)
Error. Pt no longer uses [facility].

## 2017-04-09 ENCOUNTER — Telehealth: Payer: Self-pay | Admitting: Family Medicine

## 2017-04-09 MED ORDER — ONETOUCH ULTRASOFT LANCETS MISC
12 refills | Status: DC
Start: 2017-04-09 — End: 2018-04-15

## 2017-04-09 NOTE — Telephone Encounter (Signed)
Wife calling to advise that pt's lancets (One Touch Ultra Soft ) need to [facility] because [facility] not fill them. He is out. Thank you.

## 2017-04-09 NOTE — Telephone Encounter (Signed)
SW pts wife to verify which [facility] pt needs [facility]ets sent to. Also needed to find out how often pt is testing. Pt is testing every other day or so per wife. Rx sent.

## 2017-04-10 ENCOUNTER — Other Ambulatory Visit: Payer: Self-pay | Admitting: Family Medicine

## 2017-04-11 NOTE — Telephone Encounter (Signed)
Looks like this medication was d/c on 02/04/17 by [facility].   Please advise. Thanks.

## 2017-04-29 ENCOUNTER — Encounter: Payer: Self-pay | Admitting: Family Medicine

## 2017-04-29 ENCOUNTER — Other Ambulatory Visit: Payer: Self-pay | Admitting: Family Medicine

## 2017-05-15 DIAGNOSIS — L84 Corns and callosities: Secondary | ICD-10-CM | POA: Diagnosis not present

## 2017-05-15 DIAGNOSIS — E1351 Other specified diabetes mellitus with diabetic peripheral angiopathy without gangrene: Secondary | ICD-10-CM | POA: Diagnosis not present

## 2017-05-15 DIAGNOSIS — L602 Onychogryphosis: Secondary | ICD-10-CM | POA: Diagnosis not present

## 2017-05-20 ENCOUNTER — Other Ambulatory Visit: Payer: Self-pay | Admitting: Family Medicine

## 2017-05-29 ENCOUNTER — Other Ambulatory Visit: Payer: Self-pay | Admitting: Family Medicine

## 2017-05-29 NOTE — Telephone Encounter (Signed)
[facility].  RF request for finasteride LOV: 03/08/17 Next ov: 06/07/17 Last written: 10/08/16 #90 w/ 1RF

## 2017-06-03 DIAGNOSIS — H02831 Dermatochalasis of right upper eyelid: Secondary | ICD-10-CM | POA: Diagnosis not present

## 2017-06-03 DIAGNOSIS — H02042 Spastic entropion of right lower eyelid: Secondary | ICD-10-CM | POA: Diagnosis not present

## 2017-06-03 DIAGNOSIS — H02012 Cicatricial entropion of right lower eyelid: Secondary | ICD-10-CM | POA: Diagnosis not present

## 2017-06-03 DIAGNOSIS — H02834 Dermatochalasis of left upper eyelid: Secondary | ICD-10-CM | POA: Diagnosis not present

## 2017-06-03 DIAGNOSIS — H02421 Myogenic ptosis of right eyelid: Secondary | ICD-10-CM | POA: Diagnosis not present

## 2017-06-03 DIAGNOSIS — H0279 Other degenerative disorders of eyelid and periocular area: Secondary | ICD-10-CM | POA: Diagnosis not present

## 2017-06-03 DIAGNOSIS — H02413 Mechanical ptosis of bilateral eyelids: Secondary | ICD-10-CM | POA: Diagnosis not present

## 2017-06-03 DIAGNOSIS — H02002 Unspecified entropion of right lower eyelid: Secondary | ICD-10-CM | POA: Diagnosis not present

## 2017-06-03 DIAGNOSIS — H02032 Senile entropion of right lower eyelid: Secondary | ICD-10-CM | POA: Diagnosis not present

## 2017-06-03 DIAGNOSIS — H02423 Myogenic ptosis of bilateral eyelids: Secondary | ICD-10-CM | POA: Diagnosis not present

## 2017-06-03 DIAGNOSIS — H02422 Myogenic ptosis of left eyelid: Secondary | ICD-10-CM | POA: Diagnosis not present

## 2017-06-03 DIAGNOSIS — H0232 Blepharochalasis right lower eyelid: Secondary | ICD-10-CM | POA: Diagnosis not present

## 2017-06-07 ENCOUNTER — Encounter: Payer: Self-pay | Admitting: Family Medicine

## 2017-06-07 ENCOUNTER — Ambulatory Visit (INDEPENDENT_AMBULATORY_CARE_PROVIDER_SITE_OTHER): Payer: Medicare Other | Admitting: Family Medicine

## 2017-06-07 VITALS — BP 112/60 | HR 55 | Temp 97.4°F | Resp 16 | Ht 66.0 in | Wt 173.0 lb

## 2017-06-07 DIAGNOSIS — W19XXXA Unspecified fall, initial encounter: Secondary | ICD-10-CM | POA: Diagnosis not present

## 2017-06-07 DIAGNOSIS — E119 Type 2 diabetes mellitus without complications: Secondary | ICD-10-CM

## 2017-06-07 DIAGNOSIS — Z0000 Encounter for general adult medical examination without abnormal findings: Secondary | ICD-10-CM | POA: Diagnosis not present

## 2017-06-07 DIAGNOSIS — Y92009 Unspecified place in unspecified non-institutional (private) residence as the place of occurrence of the external cause: Secondary | ICD-10-CM | POA: Diagnosis not present

## 2017-06-07 DIAGNOSIS — I10 Essential (primary) hypertension: Secondary | ICD-10-CM | POA: Diagnosis not present

## 2017-06-07 DIAGNOSIS — E7800 Pure hypercholesterolemia, unspecified: Secondary | ICD-10-CM

## 2017-06-07 LAB — POCT GLYCOSYLATED HEMOGLOBIN (HGB A1C): Hemoglobin A1C: 5.7

## 2017-06-07 NOTE — Progress Notes (Signed)
OFFICE VISIT  06/07/2017   CC:  Chief Complaint  Patient presents with  . Follow-up    RCI, pt is not fasting.    HPI:    Patient is a 82 y.o. Caucasian male who presents for 3 mo f/u DM 2, HTN, HLD.  DM 2: checks glucose q2-3 d fasting, 100-115.  He cut back on his metformin XR 500 mg to ONE tab each morning.  HTN: occ home bp monitoring: 120/60.   HLD: takes generic crestor 67m qd.  No side effects.  ROS: appetite good.  Occ falls when working in the yard--no injury.  No dizziness or palpitations.  No focal weakness.  No CP or SOB but he does not exert himself.    Past Medical History:  Diagnosis Date  . BPH with obstruction/lower urinary tract symptoms 10/27/2015   Dr. [place]arsten Ro . Cataract   . Coronary artery disease   . Dementia   . [place]   . Diverticulosis of colon    severe, entire colon  . [place] 01[place]   Abd u/s: 2.9 cm aortic ectasia--at risk for aneurism development.  Recheck aortic u/s 5 yrs.  . Erectile dysfunction due to arterial insufficiency   . Fatigue   . Gout    always 2nd toe L foot (uric acid 6.20 Dec 2014 per old records)  . History of adenomatous polyp of colon 2002  . History of stomach ulcers   . Hyperlipidemia   . Hypertension   . Hypogonadism male   . [place]   due to acute biliary tract infection (no stones) and acute diverticulitis.  . Macrocytic anemia 01/2017   vit B12 borderline low and iron borderline low: checking hemoccults and starting vit B12 PO and iron PO as of 02/11/17.  . Myogenic ptosis of bilateral eyelids 2018   [place]  .Marland KitchenPast use of tobacco    quit 1984  . Rectal bleeding   . Rosacea   . S/P coronary artery bypass graft x 3   . Urine incontinence    Dr. [place]arsten Ro   Past Surgical History:  Procedure Laterality Date  . CARDIOVASCULAR STRESS TEST  08/28/2016   Low risk myoview, normal  EF, no ischemia.  .Marland KitchenCATARACT EXTRACTION, BILATERAL    . COLONOSCOPY  2002, 2006, 02/25/2009   Polyp x 1 2002, none 2006 or 2010  . CORONARY ARTERY BYPASS GRAFT  06/2009   descending,saphenous vein graft to first obtuse marginal, sequential saphenous vein graft to posterior descending and posterolateral  . Endoscopic vein harvest right thigh    . PTCA    . TONSILLECTOMY    . UMBILICAL HERNIA REPAIR     2009  . VASECTOMY      Outpatient Medications Prior to Visit  Medication Sig Dispense Refill  . allopurinol (ZYLOPRIM) 100 MG tablet TAKE ONE TABLET BY MOUTH ON MONDAY, WEDNESDAY, AND FRIDAY 30 tablet 11  . ALPRAZolam (NIRAVAM) 0.25 MG dissolvable tablet Take 0.25 mg by mouth at bedtime as needed for anxiety.    .Marland Kitchenaspirin 325 MG tablet Take 325 mg by mouth daily.      .Marland Kitchendonepezil (ARICEPT ODT) 10 MG disintegrating tablet Take 1 tablet (10 mg total) by mouth at bedtime. 30 tablet 11  . ferrous sulfate 325 (65 FE) MG EC tablet Take 325 mg by mouth daily with breakfast.    . finasteride (  PROSCAR) 5 MG tablet TAKE ONE TABLET BY MOUTH DAILY 90 tablet 1  . Lancets (ONETOUCH ULTRASOFT) lancets Use to check blood sugar once daily 100 each 12  . lisinopril (PRINIVIL,ZESTRIL) 5 MG tablet TAKE ONE TABLET BY MOUTH DAILY 90 tablet 1  . metFORMIN (GLUCOPHAGE-XR) 500 MG 24 hr tablet Take 500 mg by mouth 2 (two) times daily.    . metoprolol tartrate (LOPRESSOR) 25 MG tablet TAKE ONE TABLET BY MOUTH TWICE DAILY 180 tablet 0  . MULTIPLE VITAMIN PO Take 1 tablet by mouth daily.     Marland Kitchen oxybutynin (DITROPAN-XL) 10 MG 24 hr tablet TAKE ONE TABLET BY MOUTH DAILY 90 tablet 1  . rosuvastatin (CRESTOR) 10 MG tablet TAKE ONE TABLET BY MOUTH DAILY 90 tablet 0  . tamsulosin (FLOMAX) 0.4 MG CAPS capsule TAKE ONE CAPSULE BY MOUTH DAILY 90 capsule 1  . Trospium Chloride 60 MG CP24 TAKE ONE CAPSULE BY MOUTH DAILY 90 capsule 0  . vitamin B-12 (CYANOCOBALAMIN) 1000 MCG tablet Take 1,000 mcg by mouth daily.    . indomethacin  (INDOCIN) 25 MG capsule Take 25 mg by mouth 2 (two) times daily as needed.    . metroNIDAZOLE (METROCREAM) 0.75 % cream Apply 1 application topically 2 (two) times daily.    . nitroGLYCERIN (NITROSTAT) 0.4 MG SL tablet Place 1 tablet (0.4 mg total) under the tongue every 5 (five) minutes as needed for chest pain. 25 tablet 3  . rosuvastatin (CRESTOR) 10 MG tablet Take 10 mg by mouth daily.     No facility-administered medications prior to visit.     Allergies  Allergen Reactions  . Sulfonamide Derivatives     Unknown, per pt and "cant stand it"    ROS As per HPI  PE: Blood pressure 112/60, pulse (!) 55, temperature (!) 97.4 F (36.3 C), temperature source Oral, resp. rate 16, height 5' 6"  (1.676 m), weight 173 lb (78.5 kg), SpO2 98 %. Gen: Alert, well appearing.  Patient is oriented to person, place, time, and situation. AFFECT: pleasant, lucid thought and speech. CV: RRR, no m/r/g.   LUNGS: CTA bilat, nonlabored resps, good aeration in all lung fields. EXT: no clubbing, cyanosis, or edema.    LABS:  Lab Results  Component Value Date   TSH 4.19 04/27/2015   Lab Results  Component Value Date   WBC 8.3 03/08/2017   HGB 13.3 03/08/2017   HCT 39.9 03/08/2017   MCV 103.0 (H) 03/08/2017   PLT 172.0 03/08/2017   Lab Results  Component Value Date   VITAMINB12 539 03/08/2017   Lab Results  Component Value Date   IRON 70 03/08/2017   IRON 76 03/08/2017   TIBC 316 03/08/2017   FERRITIN 55.0 03/08/2017    Lab Results  Component Value Date   CREATININE 1.03 02/08/2017   BUN 8 02/08/2017   NA 141 02/08/2017   K 4.4 02/08/2017   CL 105 02/08/2017   CO2 29 02/08/2017   Lab Results  Component Value Date   ALT 29 02/08/2017   AST 18 02/08/2017   ALKPHOS 99 02/08/2017   BILITOT 0.6 02/08/2017   Lab Results  Component Value Date   CHOL 109 07/04/2016   Lab Results  Component Value Date   HDL 45.90 07/04/2016   Lab Results  Component Value Date   LDLCALC 50  07/04/2016   Lab Results  Component Value Date   TRIG 68.0 07/04/2016   Lab Results  Component Value Date   CHOLHDL 2 07/04/2016  Lab Results  Component Value Date   HGBA1C 5.9 11/01/2016   POC A1c today: 5.7%  IMPRESSION AND PLAN:  1) DM 2.  Home glucoses good when fasting, even with recent decrease in his metformin that he did. He is due for diab retpthy exam: he'll schedule this at [place]. POC A1c today: 5.7%.  Continue current metformin XR 500 mg qAM.  2) HTN: The current medical regimen is effective;  continue present plan and medications. Lytes/cr good 02/2017. Recheck these next o/v.  3) HLD: tolerating statin.  He is not fasting today. Will recheck FLP at next f/u in 3 mo.  4) Preventative health care: shingrix rx given to pt today.  5) Falls at home: pt declined PT referral to help with strength/balance.  An After Visit Summary was printed and given to the patient.   FOLLOW UP: Return in about 3 months (around 09/07/2017) for routine chronic illness f/u.  Signed:  Crissie Sickles, MD           06/07/2017

## 2017-06-19 DIAGNOSIS — K7581 Nonalcoholic steatohepatitis (NASH): Secondary | ICD-10-CM

## 2017-06-19 HISTORY — DX: Nonalcoholic steatohepatitis (NASH): K75.81

## 2017-06-21 ENCOUNTER — Encounter: Payer: Self-pay | Admitting: Family Medicine

## 2017-06-21 NOTE — Telephone Encounter (Signed)
Okay for patient to get Shingrix RX?  Please advise.

## 2017-06-24 ENCOUNTER — Other Ambulatory Visit: Payer: Self-pay | Admitting: *Deleted

## 2017-06-24 MED ORDER — ZOSTER VAC RECOMB ADJUVANTED 50 MCG/0.5ML IM SUSR: INTRAMUSCULAR | 1 refills | Status: DC

## 2017-07-04 ENCOUNTER — Telehealth: Payer: Self-pay | Admitting: Family Medicine

## 2017-07-04 NOTE — Telephone Encounter (Signed)
[facility] - Client [facility]  Patient Name: Ricardo Washington  DOB: 1935-10-21    Initial Comment Caller states husband vomited blood   Nurse Assessment  Nurse: Harlow Mares, RN, Suanne Marker Date/Time Eilene Ghazi Time): 07/04/2017 1:16:04 PM  Confirm and document reason for call. If symptomatic, describe symptoms. ---Caller states husband vomited blood possibly. He reports that he had tomato soup yesterday. Was a small amount of a red substance. Wife is concerned. Reports that he took 2 tylenol for back pain and he then had stomach pain.  Does the patient have any new or worsening symptoms? ---Yes  Will a triage be completed? ---Yes  Related visit to physician within the last 2 weeks? ---N/A  Does the PT have any chronic conditions? (i.e. diabetes, asthma, etc.) ---Yes  List chronic conditions. ---diabetes; dementia; hx of heart bypass surgery; HTN  Is this a behavioral health or substance abuse call? ---No     Guidelines    Guideline Title Affirmed Question Affirmed Notes  Vomiting Blood Age > 60 years    Final Disposition User   Go to ED Now (or PCP triage) Harlow Mares, RN, Rh[place]    Comments  Caller requested appt for tomorrow with Dr. Anitra Lauth. Appt made for 10:15am at the [facility] office with Dr. Ernestine Conrad.   Referrals  GO TO FACILITY REFUSED   Disagree/Comply: Disagree  Disagree/Comply Reason: Wait and see

## 2017-07-04 NOTE — Telephone Encounter (Signed)
Please advise. Thanks.  

## 2017-07-04 NOTE — Telephone Encounter (Signed)
OK for appt with me.

## 2017-07-05 ENCOUNTER — Encounter: Payer: Self-pay | Admitting: Family Medicine

## 2017-07-05 ENCOUNTER — Ambulatory Visit (INDEPENDENT_AMBULATORY_CARE_PROVIDER_SITE_OTHER): Payer: Medicare Other | Admitting: Family Medicine

## 2017-07-05 VITALS — BP 109/66 | HR 55 | Temp 97.8°F | Resp 16 | Ht 66.0 in | Wt 171.0 lb

## 2017-07-05 DIAGNOSIS — K920 Hematemesis: Secondary | ICD-10-CM

## 2017-07-05 DIAGNOSIS — R110 Nausea: Secondary | ICD-10-CM | POA: Diagnosis not present

## 2017-07-05 DIAGNOSIS — K2970 Gastritis, unspecified, without bleeding: Secondary | ICD-10-CM | POA: Diagnosis not present

## 2017-07-05 LAB — HEMOCCULT GUIAC POC 1CARD (OFFICE): FECAL OCCULT BLD: NEGATIVE

## 2017-07-05 LAB — CBC WITH DIFFERENTIAL/PLATELET
BASOS ABS: 0.1 10*3/uL (ref 0.0–0.1)
Basophils Relative: 0.7 % (ref 0.0–3.0)
EOS PCT: 0.6 % (ref 0.0–5.0)
Eosinophils Absolute: 0 10*3/uL (ref 0.0–0.7)
HEMATOCRIT: 38.8 % — AB (ref 39.0–52.0)
HEMOGLOBIN: 12.9 g/dL — AB (ref 13.0–17.0)
LYMPHS ABS: 1.3 10*3/uL (ref 0.7–4.0)
LYMPHS PCT: 16.1 % (ref 12.0–46.0)
MCHC: 33.2 g/dL (ref 30.0–36.0)
MCV: 103.8 fl — AB (ref 78.0–100.0)
MONOS PCT: 7.3 % (ref 3.0–12.0)
Monocytes Absolute: 0.6 10*3/uL (ref 0.1–1.0)
NEUTROS PCT: 75.3 % (ref 43.0–77.0)
Neutro Abs: 5.9 10*3/uL (ref 1.4–7.7)
Platelets: 163 10*3/uL (ref 150.0–400.0)
RBC: 3.74 Mil/uL — AB (ref 4.22–5.81)
RDW: 14 % (ref 11.5–15.5)
WBC: 7.9 10*3/uL (ref 4.0–10.5)

## 2017-07-05 LAB — COMPREHENSIVE METABOLIC PANEL
ALBUMIN: 3.8 g/dL (ref 3.5–5.2)
ALK PHOS: 164 U/L — AB (ref 39–117)
ALT: 450 U/L — AB (ref 0–53)
AST: 451 U/L — ABNORMAL HIGH (ref 0–37)
BILIRUBIN TOTAL: 4.6 mg/dL — AB (ref 0.2–1.2)
BUN: 20 mg/dL (ref 6–23)
CO2: 28 mEq/L (ref 19–32)
Calcium: 9 mg/dL (ref 8.4–10.5)
Chloride: 104 mEq/L (ref 96–112)
Creatinine, Ser: 1.22 mg/dL (ref 0.40–1.50)
GFR: 60.66 mL/min (ref >60.00)
GLUCOSE: 115 mg/dL — AB (ref 70–99)
POTASSIUM: 4.4 meq/L (ref 3.5–5.1)
Sodium: 140 mEq/L (ref 135–145)
TOTAL PROTEIN: 6.1 g/dL (ref 6.0–8.3)

## 2017-07-05 NOTE — Progress Notes (Signed)
OFFICE VISIT  07/05/2017   CC:  Chief Complaint  Patient presents with  . Emesis   HPI:    Patient is a 82 y.o. Caucasian male who presents for question of vomiting up blood yesterday. Got up yesterday morning, ate blueberries on cheerios, ate blueberry muffin, ate cake with raisins in it the night prior.  Later that morning he started feeling upset stomach/felt bloating, vomited x 2.  The emesis had some cake particles from the cake the night before + he said the emesis was "red" colored.  The EMS went to his home and witness emesis and told pt that the discoloration was blueberries.  He then felt better, nausea resolved, bloating feeling resolved.  Last night he ate a potato without problem.  Has felt normal since.  Ate cheerios with no blueberries today and it caused no sx's.  He has not had any diarrhea with this.  He had one stool before vomiting and one after and both were formed and black color.  The stools have been black ever since being put on iron supplement.  Past Medical History:  Diagnosis Date  . BPH with obstruction/lower urinary tract symptoms 10/27/2015   Dr. Karsten Ro  . Cataract   . Coronary artery disease   . Dementia   . Diabetes mellitus without complication ([place])   . Diverticulosis of colon    severe, entire colon  . Ectatic abdominal aorta ([place]) 01/2017   Abd u/s: 2.9 cm aortic ectasia--at risk for aneurism development.  Recheck aortic u/s 5 yrs.  . Erectile dysfunction due to arterial insufficiency   . Fatigue   . Gout    always 2nd toe L foot (uric acid 6.20 Dec 2014 per old records)  . History of adenomatous polyp of colon 2002  . History of stomach ulcers   . Hyperlipidemia   . Hypertension   . Hypogonadism male   . Klebsiella sepsis ([place]) 01/2017   due to acute biliary tract infection (no stones) and acute diverticulitis.  . Macrocytic anemia 01/2017   vit B12 borderline low and iron borderline low: checking hemoccults and starting vit B12 PO and  iron PO as of 02/11/17.  . Myogenic ptosis of bilateral eyelids 2018   Plastic surgery in [place], [place] to do surg as of 03/2017.  Marland Kitchen Past use of tobacco    quit 1984  . Rectal bleeding   . Rosacea   . S/P coronary artery bypass graft x 3   . Urine incontinence    Dr. Karsten Ro    Past Surgical History:  Procedure Laterality Date  . CARDIOVASCULAR STRESS TEST  08/28/2016   Low risk myoview, normal EF, no ischemia.  Marland Kitchen CATARACT EXTRACTION, BILATERAL    . COLONOSCOPY  2002, 2006, 02/25/2009   Polyp x 1 2002, none 2006 or 2010  . CORONARY ARTERY BYPASS GRAFT  06/2009   descending,saphenous vein graft to first obtuse marginal, sequential saphenous vein graft to posterior descending and posterolateral  . Endoscopic vein harvest right thigh    . PTCA    . TONSILLECTOMY    . UMBILICAL HERNIA REPAIR     2009  . VASECTOMY      Outpatient Medications Prior to Visit  Medication Sig Dispense Refill  . allopurinol (ZYLOPRIM) 100 MG tablet TAKE ONE TABLET BY MOUTH ON MONDAY, WEDNESDAY, AND FRIDAY 30 tablet 11  . ALPRAZolam (NIRAVAM) 0.25 MG dissolvable tablet Take 0.25 mg by mouth at bedtime as needed for anxiety.    Marland Kitchen  aspirin 325 MG tablet Take 325 mg by mouth daily.      Marland Kitchen donepezil (ARICEPT ODT) 10 MG disintegrating tablet Take 1 tablet (10 mg total) by mouth at bedtime. 30 tablet 11  . ferrous sulfate 325 (65 FE) MG EC tablet Take 325 mg by mouth daily with breakfast.    . finasteride (PROSCAR) 5 MG tablet TAKE ONE TABLET BY MOUTH DAILY 90 tablet 1  . indomethacin (INDOCIN) 25 MG capsule Take 25 mg by mouth 2 (two) times daily as needed.    . Lancets (ONETOUCH ULTRASOFT) lancets Use to check blood sugar once daily 100 each 12  . lisinopril (PRINIVIL,ZESTRIL) 5 MG tablet TAKE ONE TABLET BY MOUTH DAILY 90 tablet 1  . metFORMIN (GLUCOPHAGE-XR) 500 MG 24 hr tablet Take 500 mg by mouth 2 (two) times daily.    . metoprolol tartrate (LOPRESSOR) 25 MG tablet TAKE ONE TABLET BY MOUTH TWICE DAILY 180 tablet 0   . metroNIDAZOLE (METROCREAM) 0.75 % cream Apply 1 application topically 2 (two) times daily.    . MULTIPLE VITAMIN PO Take 1 tablet by mouth daily.     Marland Kitchen oxybutynin (DITROPAN-XL) 10 MG 24 hr tablet TAKE ONE TABLET BY MOUTH DAILY 90 tablet 1  . rosuvastatin (CRESTOR) 10 MG tablet TAKE ONE TABLET BY MOUTH DAILY 90 tablet 0  . tamsulosin (FLOMAX) 0.4 MG CAPS capsule TAKE ONE CAPSULE BY MOUTH DAILY 90 capsule 1  . Trospium Chloride 60 MG CP24 TAKE ONE CAPSULE BY MOUTH DAILY 90 capsule 0  . vitamin B-12 (CYANOCOBALAMIN) 1000 MCG tablet Take 1,000 mcg by mouth daily.    . nitroGLYCERIN (NITROSTAT) 0.4 MG SL tablet Place 1 tablet (0.4 mg total) under the tongue every 5 (five) minutes as needed for chest pain. 25 tablet 3  . Zoster Vac Recomb Adjuvanted [facility]) injection repeat in 2-6 months (Patient not taking: Reported on 07/05/2017) 0.5 mL 1   No facility-administered medications prior to visit.     Allergies  Allergen Reactions  . Sulfonamide Derivatives     Unknown, per pt and "cant stand it"    ROS As per HPI  PE: Blood pressure 109/66, pulse (!) 55, temperature 97.8 F (36.6 C), temperature source Oral, resp. rate 16, height 5' 6"  (1.676 m), weight 171 lb (77.6 kg), SpO2 96 %. Gen: Alert, well appearing.  Patient is oriented to person, place, time, and situation. AFFECT: pleasant, lucid thought and speech. WNI:OEVO: no injection, icteris, swelling, or exudate.  EOMI, PERRLA. Mouth: lips without lesion/swelling.  Oral mucosa pink and moist. Oropharynx without erythema, exudate, or swelling.  CV: RRR, no m/r/g.   LUNGS: CTA bilat, nonlabored resps, good aeration in all lung fields. ABD: soft, NT, ND, BS normal.  No hepatospenomegaly or mass.  No bruits.  LABS:   POC Hemoccult in exam room today: NEGATIVE  Lab Results  Component Value Date   WBC 8.3 03/08/2017   HGB 13.3 03/08/2017   HCT 39.9 03/08/2017   MCV 103.0 (H) 03/08/2017   PLT 172.0 03/08/2017     Chemistry       Component Value Date/Time   NA 141 02/08/2017 1631   K 4.4 02/08/2017 1631   CL 105 02/08/2017 1631   CO2 29 02/08/2017 1631   BUN 8 02/08/2017 1631   CREATININE 1.03 02/08/2017 1631      Component Value Date/Time   CALCIUM 8.7 02/08/2017 1631   ALKPHOS 99 02/08/2017 1631   AST 18 02/08/2017 1631   ALT 29 02/08/2017  1631   BILITOT 0.6 02/08/2017 1631     Lab Results  Component Value Date   INR 1.46 02/01/2017   INR 1.34 01/31/2017   INR 1.4 07/14/2009    IMPRESSION AND PLAN:  1) Recent reddish discoloration of emesis.  Resolved and pt feels completely well now. Suspect the n/v and discoloration of the emesis was due to recent ingestion of too many blueberries (see HPI). He has no recurrent postprandial abd symptoms. Exam normal today, including NEG hemoccult. Will check CBC and CMET today. Reassured pt today that I did not think he had any blood in his vomit. Signs/symptoms to call or return for were reviewed and pt expressed understanding.  An After Visit Summary was printed and given to the patient.  FOLLOW UP: Return for f/u to be determined based on result of w/u.  Signed:  Crissie Sickles, MD           07/05/2017

## 2017-07-08 ENCOUNTER — Other Ambulatory Visit: Payer: Self-pay | Admitting: *Deleted

## 2017-07-08 ENCOUNTER — Ambulatory Visit (INDEPENDENT_AMBULATORY_CARE_PROVIDER_SITE_OTHER): Payer: Medicare Other

## 2017-07-08 ENCOUNTER — Other Ambulatory Visit: Payer: Medicare Other

## 2017-07-08 ENCOUNTER — Encounter: Payer: Self-pay | Admitting: Family Medicine

## 2017-07-08 ENCOUNTER — Other Ambulatory Visit: Payer: Self-pay | Admitting: Family Medicine

## 2017-07-08 ENCOUNTER — Other Ambulatory Visit (INDEPENDENT_AMBULATORY_CARE_PROVIDER_SITE_OTHER): Payer: Medicare Other

## 2017-07-08 DIAGNOSIS — B179 Acute viral hepatitis, unspecified: Secondary | ICD-10-CM

## 2017-07-08 DIAGNOSIS — K811 Chronic cholecystitis: Secondary | ICD-10-CM | POA: Diagnosis not present

## 2017-07-08 DIAGNOSIS — R7401 Elevation of levels of liver transaminase levels: Secondary | ICD-10-CM

## 2017-07-08 DIAGNOSIS — D649 Anemia, unspecified: Secondary | ICD-10-CM

## 2017-07-08 DIAGNOSIS — R740 Nonspecific elevation of levels of transaminase and lactic acid dehydrogenase [LDH]: Principal | ICD-10-CM

## 2017-07-08 DIAGNOSIS — K760 Fatty (change of) liver, not elsewhere classified: Secondary | ICD-10-CM | POA: Diagnosis not present

## 2017-07-08 LAB — COMPREHENSIVE METABOLIC PANEL
ALBUMIN: 3.8 g/dL (ref 3.5–5.2)
ALK PHOS: 139 U/L — AB (ref 39–117)
ALT: 130 U/L — AB (ref 0–53)
AST: 34 U/L (ref 0–37)
BILIRUBIN TOTAL: 0.9 mg/dL (ref 0.2–1.2)
BUN: 17 mg/dL (ref 6–23)
CO2: 26 mEq/L (ref 19–32)
Calcium: 9 mg/dL (ref 8.4–10.5)
Chloride: 106 mEq/L (ref 96–112)
Creatinine, Ser: 1.02 mg/dL (ref 0.40–1.50)
GFR: 74.58 mL/min (ref >60.00)
GLUCOSE: 161 mg/dL — AB (ref 70–99)
POTASSIUM: 4.2 meq/L (ref 3.5–5.1)
Sodium: 141 mEq/L (ref 135–145)
TOTAL PROTEIN: 6.8 g/dL (ref 6.0–8.3)

## 2017-07-08 LAB — CBC
HEMATOCRIT: 39.4 % (ref 39.0–52.0)
Hemoglobin: 13.2 g/dL (ref 13.0–17.0)
MCHC: 33.6 g/dL (ref 30.0–36.0)
MCV: 104.5 fl — ABNORMAL HIGH (ref 78.0–100.0)
PLATELETS: 174 10*3/uL (ref 150.0–400.0)
RBC: 3.77 Mil/uL — ABNORMAL LOW (ref 4.22–5.81)
RDW: 14.1 % (ref 11.5–15.5)
WBC: 6.1 10*3/uL (ref 4.0–10.5)

## 2017-07-09 LAB — HEPATITIS B SURFACE ANTIGEN: Hepatitis B Surface Ag: NONREACTIVE

## 2017-07-09 LAB — HEPATITIS A ANTIBODY, IGM: Hep A IgM: NONREACTIVE

## 2017-07-09 LAB — HEPATITIS C ANTIBODY: HCV AB: NONREACTIVE

## 2017-07-24 DIAGNOSIS — L602 Onychogryphosis: Secondary | ICD-10-CM | POA: Diagnosis not present

## 2017-07-24 DIAGNOSIS — E1351 Other specified diabetes mellitus with diabetic peripheral angiopathy without gangrene: Secondary | ICD-10-CM | POA: Diagnosis not present

## 2017-07-24 DIAGNOSIS — L84 Corns and callosities: Secondary | ICD-10-CM | POA: Diagnosis not present

## 2017-08-05 ENCOUNTER — Other Ambulatory Visit: Payer: Self-pay | Admitting: Family Medicine

## 2017-08-05 NOTE — Telephone Encounter (Signed)
[place]  RF request for trospium LOV: 06/07/17 Next ov: 09/09/17 Last written: 04/29/17 #90 w/ 0RF  Please advise. Thanks.

## 2017-08-19 ENCOUNTER — Other Ambulatory Visit: Payer: Self-pay | Admitting: Family Medicine

## 2017-09-05 ENCOUNTER — Ambulatory Visit (INDEPENDENT_AMBULATORY_CARE_PROVIDER_SITE_OTHER): Payer: Medicare Other

## 2017-09-05 DIAGNOSIS — Z23 Encounter for immunization: Secondary | ICD-10-CM

## 2017-09-09 ENCOUNTER — Ambulatory Visit (INDEPENDENT_AMBULATORY_CARE_PROVIDER_SITE_OTHER): Payer: Medicare Other | Admitting: Family Medicine

## 2017-09-09 ENCOUNTER — Encounter: Payer: Self-pay | Admitting: Family Medicine

## 2017-09-09 VITALS — BP 113/64 | HR 55 | Temp 97.7°F | Resp 16 | Ht 66.0 in | Wt 175.0 lb

## 2017-09-09 DIAGNOSIS — R6889 Other general symptoms and signs: Secondary | ICD-10-CM

## 2017-09-09 DIAGNOSIS — I10 Essential (primary) hypertension: Secondary | ICD-10-CM

## 2017-09-09 DIAGNOSIS — E7800 Pure hypercholesterolemia, unspecified: Secondary | ICD-10-CM

## 2017-09-09 DIAGNOSIS — F0390 Unspecified dementia without behavioral disturbance: Secondary | ICD-10-CM

## 2017-09-09 DIAGNOSIS — R7401 Elevation of levels of liver transaminase levels: Secondary | ICD-10-CM

## 2017-09-09 DIAGNOSIS — E119 Type 2 diabetes mellitus without complications: Secondary | ICD-10-CM

## 2017-09-09 DIAGNOSIS — R740 Nonspecific elevation of levels of transaminase and lactic acid dehydrogenase [LDH]: Secondary | ICD-10-CM | POA: Diagnosis not present

## 2017-09-09 LAB — COMPREHENSIVE METABOLIC PANEL WITH GFR
ALT: 11 U/L (ref 0–53)
AST: 15 U/L (ref 0–37)
Albumin: 4.2 g/dL (ref 3.5–5.2)
Alkaline Phosphatase: 78 U/L (ref 39–117)
BUN: 17 mg/dL (ref 6–23)
CO2: 28 meq/L (ref 19–32)
Calcium: 9.5 mg/dL (ref 8.4–10.5)
Chloride: 105 meq/L (ref 96–112)
Creatinine, Ser: 1.02 mg/dL (ref 0.40–1.50)
GFR: 74.55 mL/min
Glucose, Bld: 86 mg/dL (ref 70–99)
Potassium: 4.8 meq/L (ref 3.5–5.1)
Sodium: 141 meq/L (ref 135–145)
Total Bilirubin: 0.5 mg/dL (ref 0.2–1.2)
Total Protein: 6.9 g/dL (ref 6.0–8.3)

## 2017-09-09 LAB — TSH: TSH: 4.76 u[IU]/mL — ABNORMAL HIGH (ref 0.35–4.50)

## 2017-09-09 LAB — HEMOGLOBIN A1C: Hgb A1c MFr Bld: 6.1 % (ref 4.6–6.5)

## 2017-09-09 MED ORDER — MEMANTINE HCL 5 MG PO TABS: ORAL_TABLET | ORAL | 0 refills | Status: DC

## 2017-09-09 NOTE — Progress Notes (Signed)
OFFICE VISIT  09/09/2017   CC:  Chief Complaint  Patient presents with  . Follow-up    RCI, pt is not fasting.    HPI:  Patient is a 82 y.o. Caucasian male who presents for 3 mo f/u DM 2, HTN, HLD, and dementia.  DM: avg 125.  He is taking metformin 500 mg bid.  HTN: 120s/60s consistently at home.  HLD: tolerating statin.  Exercise: gets out of house, not walking for exercise.  Diet: says he eats healthy.  Dementia: says he still has lots of trouble with short term memory.  Wife voices that he is not driving as well--gets lost easier.  Still independent in ADLs.  He says "my meds needs to be increased". No longer mows b/c he ran tractor into gully. Wife won't let him work in yard b/c he fell last time he tried doing this. Works on [employer] all day still.  He turned the financial concerns of the home over to his wife.  ROS: no fevers, no HAs, no dizziness, no CP, no myalgias, no dysphagia but he gets somewhat choked if he eats too fast.  No choking if eats slower.  No dysphagia.  He has cold intolerance--unclear how long this has been a problem.  Past Medical History:  Diagnosis Date  . BPH with obstruction/lower urinary tract symptoms 10/27/2015   Dr. Karsten Ro  . Cataract   . Coronary artery disease   . Dementia   . Diabetes mellitus without complication ([place])   . Diverticulosis of colon    severe, entire colon  . Ectatic abdominal aorta ([place]) 01/2017   Abd u/s: 2.9 cm aortic ectasia--at risk for aneurism development.  Recheck aortic u/s 5 yrs.  . Erectile dysfunction due to arterial insufficiency   . Fatigue   . Gout    always 2nd toe L foot (uric acid 6.20 Dec 2014 per old records)  . Hepatic steatosis   . History of adenomatous polyp of colon 2002  . History of stomach ulcers   . Hyperlipidemia   . Hypertension   . Hypogonadism male   . Klebsiella sepsis ([place]) 01/2017   due to acute biliary tract infection (no stones) and acute diverticulitis.  . Macrocytic  anemia 01/2017   vit B12 borderline low and iron borderline low: checking hemoccults and starting vit B12 PO and iron PO as of 02/11/17.  . Myogenic ptosis of bilateral eyelids 2018   Plastic surgery in [place], [place] to do surg as of 03/2017.  Marland Kitchen NASH (nonalcoholic steatohepatitis) 06/2017   LFTs up, abd u/s showed fatty liver but no other abnormality.  . Past use of tobacco    quit 1984  . Rectal bleeding   . Rosacea   . S/P coronary artery bypass graft x 3   . Urine incontinence    Dr. Karsten Ro    Past Surgical History:  Procedure Laterality Date  . CARDIOVASCULAR STRESS TEST  08/28/2016   Low risk myoview, normal EF, no ischemia.  Marland Kitchen CATARACT EXTRACTION, BILATERAL    . COLONOSCOPY  2002, 2006, 02/25/2009   Polyp x 1 2002, none 2006 or 2010  . CORONARY ARTERY BYPASS GRAFT  06/2009   descending,saphenous vein graft to first obtuse marginal, sequential saphenous vein graft to posterior descending and posterolateral  . Endoscopic vein harvest right thigh    . PTCA    . TONSILLECTOMY    . UMBILICAL HERNIA REPAIR     2009  . VASECTOMY  Outpatient Medications Prior to Visit  Medication Sig Dispense Refill  . allopurinol (ZYLOPRIM) 100 MG tablet TAKE ONE TABLET BY MOUTH ON MONDAY, WEDNESDAY, AND FRIDAY 30 tablet 11  . ALPRAZolam (NIRAVAM) 0.25 MG dissolvable tablet Take 0.25 mg by mouth at bedtime as needed for anxiety.    Marland Kitchen aspirin 325 MG tablet Take 325 mg by mouth daily.      Marland Kitchen donepezil (ARICEPT ODT) 10 MG disintegrating tablet dissolve 1 tablet (10 mg total) on the tongue and swallow at bedtime. 30 tablet 3  . ferrous sulfate 325 (65 FE) MG EC tablet Take 325 mg by mouth daily with breakfast.    . finasteride (PROSCAR) 5 MG tablet TAKE ONE TABLET BY MOUTH DAILY 90 tablet 1  . indomethacin (INDOCIN) 25 MG capsule Take 25 mg by mouth 2 (two) times daily as needed.    . Lancets (ONETOUCH ULTRASOFT) lancets Use to check blood sugar once daily 100 each 12  . lisinopril (PRINIVIL,ZESTRIL)  5 MG tablet TAKE ONE TABLET BY MOUTH DAILY 90 tablet 1  . metFORMIN (GLUCOPHAGE-XR) 500 MG 24 hr tablet Take 500 mg by mouth 2 (two) times daily.    . metoprolol tartrate (LOPRESSOR) 25 MG tablet TAKE ONE TABLET BY MOUTH TWICE DAILY 180 tablet 0  . metroNIDAZOLE (METROCREAM) 0.75 % cream Apply 1 application topically 2 (two) times daily.    . MULTIPLE VITAMIN PO Take 1 tablet by mouth daily.     Marland Kitchen oxybutynin (DITROPAN-XL) 10 MG 24 hr tablet TAKE ONE TABLET BY MOUTH DAILY 90 tablet 1  . rosuvastatin (CRESTOR) 10 MG tablet TAKE ONE TABLET BY MOUTH DAILY 90 tablet 1  . tamsulosin (FLOMAX) 0.4 MG CAPS capsule TAKE ONE CAPSULE BY MOUTH DAILY 90 capsule 1  . Trospium Chloride 60 MG CP24 TAKE ONE CAPSULE BY MOUTH DAILY 90 capsule 3  . vitamin B-12 (CYANOCOBALAMIN) 1000 MCG tablet Take 1,000 mcg by mouth daily.    . nitroGLYCERIN (NITROSTAT) 0.4 MG SL tablet Place 1 tablet (0.4 mg total) under the tongue every 5 (five) minutes as needed for chest pain. 25 tablet 3   No facility-administered medications prior to visit.     Allergies  Allergen Reactions  . Sulfonamide Derivatives     Unknown, per pt and "cant stand it"    ROS As per HPI  PE: Blood pressure 113/64, pulse (!) 55, temperature 97.7 F (36.5 C), temperature source Oral, resp. rate 16, height 5' 6"  (1.676 m), weight 175 lb (79.4 kg), SpO2 99 %. Gen: Alert, well appearing.  Patient is oriented to person, place, time, and situation. AFFECT: pleasant, lucid thought and speech. Pt occ does act a bit confused about my questions but I couldn't tell if it was because of his HOH or cognitive impairment. No further exam today.  LABS:  Lab Results  Component Value Date   TSH 4.19 04/27/2015   Lab Results  Component Value Date   WBC 6.1 07/08/2017   HGB 13.2 07/08/2017   HCT 39.4 07/08/2017   MCV 104.5 (H) 07/08/2017   PLT 174.0 07/08/2017   Lab Results  Component Value Date   IRON 70 03/08/2017   IRON 76 03/08/2017   TIBC 316  03/08/2017   FERRITIN 55.0 03/08/2017   Lab Results  Component Value Date   VITAMINB12 539 03/08/2017    Lab Results  Component Value Date   CREATININE 1.02 07/08/2017   BUN 17 07/08/2017   NA 141 07/08/2017   K 4.2 07/08/2017  CL 106 07/08/2017   CO2 26 07/08/2017   Lab Results  Component Value Date   ALT 130 (H) 07/08/2017   AST 34 07/08/2017   ALKPHOS 139 (H) 07/08/2017   BILITOT 0.9 07/08/2017   Lab Results  Component Value Date   CHOL 109 07/04/2016   Lab Results  Component Value Date   HDL 45.90 07/04/2016   Lab Results  Component Value Date   LDLCALC 50 07/04/2016   Lab Results  Component Value Date   TRIG 68.0 07/04/2016   Lab Results  Component Value Date   CHOLHDL 2 07/04/2016   Lab Results  Component Value Date   HGBA1C 5.7 06/07/2017    IMPRESSION AND PLAN:  1) DM 2;  Good control. Due for diab retpthy exam. HbA1c today.  2) HTN; The current medical regimen is effective;  continue present plan and medications. Lytes/cr today.  3) HLD: tolerating statin.  LDL 50 1 yr ago.  Pt not fasting.  Will try to repeat at next f/u if fasting.  4) Dementia: not responding fully to aricept by any means.  He is tolerating this med well. We'll continue this med and add namenda, starting at 4m qd and increasing by 54mevery week to max dose of 2079md in 2 divided doses.  5) Cold intolerance: check TSH today.  6) Hx of elevated ALT and alk phos: he does have fatty liver on u/s. Recheck hepatic panel today.  An After Visit Summary was printed and given to the patient.  FOLLOW UP: Return in about 4 weeks (around 10/07/2017) for f/u memory problems.  Signed:  PhiCrissie SicklesD           09/09/2017

## 2017-09-11 ENCOUNTER — Other Ambulatory Visit: Payer: Self-pay | Admitting: *Deleted

## 2017-09-11 ENCOUNTER — Encounter: Payer: Self-pay | Admitting: *Deleted

## 2017-09-11 ENCOUNTER — Other Ambulatory Visit: Payer: Self-pay | Admitting: Family Medicine

## 2017-09-11 ENCOUNTER — Other Ambulatory Visit: Payer: Medicare Other

## 2017-09-11 DIAGNOSIS — E039 Hypothyroidism, unspecified: Secondary | ICD-10-CM

## 2017-09-11 DIAGNOSIS — E038 Other specified hypothyroidism: Secondary | ICD-10-CM

## 2017-09-11 DIAGNOSIS — R7989 Other specified abnormal findings of blood chemistry: Secondary | ICD-10-CM | POA: Diagnosis not present

## 2017-09-11 LAB — T4, FREE: FREE T4: 0.9 ng/dL (ref 0.8–1.8)

## 2017-09-11 LAB — T3: T3, Total: 73 ng/dL — ABNORMAL LOW (ref 76–181)

## 2017-09-16 ENCOUNTER — Other Ambulatory Visit: Payer: Self-pay | Admitting: Family Medicine

## 2017-09-24 DIAGNOSIS — M1812 Unilateral primary osteoarthritis of first carpometacarpal joint, left hand: Secondary | ICD-10-CM | POA: Diagnosis not present

## 2017-10-02 DIAGNOSIS — E1351 Other specified diabetes mellitus with diabetic peripheral angiopathy without gangrene: Secondary | ICD-10-CM | POA: Diagnosis not present

## 2017-10-02 DIAGNOSIS — L602 Onychogryphosis: Secondary | ICD-10-CM | POA: Diagnosis not present

## 2017-10-07 ENCOUNTER — Other Ambulatory Visit: Payer: Self-pay

## 2017-10-07 ENCOUNTER — Encounter: Payer: Self-pay | Admitting: Family Medicine

## 2017-10-07 ENCOUNTER — Ambulatory Visit (INDEPENDENT_AMBULATORY_CARE_PROVIDER_SITE_OTHER): Payer: Medicare Other | Admitting: Family Medicine

## 2017-10-07 VITALS — BP 95/56 | HR 64 | Temp 97.7°F | Resp 16 | Ht 66.0 in | Wt 175.8 lb

## 2017-10-07 DIAGNOSIS — F0390 Unspecified dementia without behavioral disturbance: Secondary | ICD-10-CM

## 2017-10-07 MED ORDER — MEMANTINE HCL 10 MG PO TABS: 10.0000 mg | ORAL_TABLET | Freq: Two times a day (BID) | ORAL | 3 refills | Status: DC

## 2017-10-07 NOTE — Progress Notes (Signed)
OFFICE VISIT  10/07/2017   CC:  Chief Complaint  Patient presents with  . Follow-up    memory problem     HPI:    Patient is a 82 y.o. Caucasian male who presents for 1 mo f/u dementia. Last visit I added namenda --with titration instructions to get him to 43m qd in divided doses. He was already on aricept 146m  He cannot tell that anything is different regarding memory.  No adverse effects from namenda.  Still having intermittent problem with choking on food. On one occasion he had lots of rice that he regurgitated after a choking spell. This ONLY occurs when he is eating too fast.  No dysphagia in between episodes.    ROS: no CP, no SOB, no cough, no HA, no nausea.    Past Medical History:  Diagnosis Date  . BPH with obstruction/lower urinary tract symptoms 10/27/2015   Dr. OtKarsten Ro. Cataract   . Coronary artery disease   . Dementia   . Diabetes [place] complication ([place]  . Diverticulosis of colon    severe, entire colon  . [place]   Abd u/s: 2.9 cm aortic ectasia--at risk for aneurism development.  Recheck aortic u/s 5 yrs.  . Erectile dysfunction due to arterial insufficiency   . Fatigue   . Gout    always 2nd toe L foot (uric acid 6.20 Dec 2014 per old records)  . Hepatic steatosis   . History of adenomatous polyp of colon 2002  . History of stomach ulcers   . Hyperlipidemia   . Hypertension   . Hypogonadism male   . Klebsiella [place]   due to acute biliary tract infection (no stones) and acute diverticulitis.  . Macrocytic anemia 01/2017   vit B12 borderline low and iron borderline low: checking hemoccults and starting vit B12 PO and iron PO as of 02/11/17.  . Myogenic ptosis of bilateral eyelids 2018   [place]  . Marland KitchenASH (nonalcoholic steatohepatitis) 06/2017   LFTs up, abd u/s showed fatty liver but no other abnormality.  . Past use of tobacco    quit  1984  . Rectal bleeding   . Rosacea   . S/P coronary artery bypass graft x 3   . Urine incontinence    Dr. OtKarsten Ro  Past Surgical History:  Procedure Laterality Date  . CARDIOVASCULAR STRESS TEST  08/28/2016   Low risk myoview, normal EF, no ischemia.  . Marland KitchenATARACT EXTRACTION, BILATERAL    . COLONOSCOPY  2002, 2006, 02/25/2009   Polyp x 1 2002, none 2006 or 2010  . CORONARY ARTERY BYPASS GRAFT  06/2009   descending,saphenous vein graft to first obtuse marginal, sequential saphenous vein graft to posterior descending and posterolateral  . Endoscopic vein harvest right thigh    . PTCA    . TONSILLECTOMY    . UMBILICAL HERNIA REPAIR     2009  . VASECTOMY      Outpatient Medications Prior to Visit  Medication Sig Dispense Refill  . allopurinol (ZYLOPRIM) 100 MG tablet TAKE ONE TABLET BY MOUTH ON MONDAY, WEDNESDAY, AND FRIDAY 30 tablet 11  . ALPRAZolam (NIRAVAM) 0.25 MG dissolvable tablet Take 0.25 mg by mouth at bedtime as needed for anxiety.    . Marland Kitchenspirin 325 MG tablet Take 325 mg by mouth daily.      . ferrous sulfate 325 (65 FE) MG EC tablet  Take 325 mg by mouth daily with breakfast.    . finasteride (PROSCAR) 5 MG tablet TAKE ONE TABLET BY MOUTH DAILY 90 tablet 1  . indomethacin (INDOCIN) 25 MG capsule Take 25 mg by mouth 2 (two) times daily as needed.    . Lancets (ONETOUCH ULTRASOFT) lancets Use to check blood sugar once daily 100 each 12  . lisinopril (PRINIVIL,ZESTRIL) 5 MG tablet TAKE ONE TABLET BY MOUTH DAILY 90 tablet 1  . metFORMIN (GLUCOPHAGE-XR) 500 MG 24 hr tablet TAKE ONE TABLET BY MOUTH TWICE DAILY WITH MEALS 180 tablet 1  . metoprolol tartrate (LOPRESSOR) 25 MG tablet TAKE ONE TABLET BY MOUTH TWICE DAILY 180 tablet 0  . MULTIPLE VITAMIN PO Take 1 tablet by mouth daily.     Marland Kitchen oxybutynin (DITROPAN-XL) 10 MG 24 hr tablet TAKE ONE TABLET BY MOUTH DAILY 90 tablet 1  . rosuvastatin (CRESTOR) 10 MG tablet TAKE ONE TABLET BY MOUTH DAILY 90 tablet 1  . tamsulosin (FLOMAX) 0.4  MG CAPS capsule TAKE ONE CAPSULE BY MOUTH DAILY 90 capsule 1  . Trospium Chloride 60 MG CP24 TAKE ONE CAPSULE BY MOUTH DAILY 90 capsule 3  . vitamin B-12 (CYANOCOBALAMIN) 1000 MCG tablet Take 1,000 mcg by mouth daily.    . memantine (NAMENDA) 5 MG tablet 1 tab po qd x 7d, then increase to 1 tab po bid x 7d, then increase to 1 tab po qAM and 2 tabs po qPM x 7 days, then increase to 2 tabs po qAM and 2 tabs po qPM. 63 tablet 0  . nitroGLYCERIN (NITROSTAT) 0.4 MG SL tablet Place 1 tablet (0.4 mg total) under the tongue every 5 (five) minutes as needed for chest pain. 25 tablet 3  . donepezil (ARICEPT ODT) 10 MG disintegrating tablet dissolve 1 tablet (10 mg total) on the tongue and swallow at bedtime. (Patient not taking: Reported on 10/07/2017) 30 tablet 3  . metFORMIN (GLUCOPHAGE-XR) 500 MG 24 hr tablet Take 500 mg by mouth 2 (two) times daily.    . metroNIDAZOLE (METROCREAM) 0.75 % cream Apply 1 application topically 2 (two) times daily.     No facility-administered medications prior to visit.     Allergies  Allergen Reactions  . Sulfonamide Derivatives     Unknown, per pt and "cant stand it"    ROS As per HPI  PE: Blood pressure (!) 95/56, pulse 64, temperature 97.7 F (36.5 C), temperature source Oral, resp. rate 16, height 5' 6"  (1.676 m), weight 175 lb 12 oz (79.7 kg), SpO2 95 %. Gen: Alert, well appearing.  Patient is oriented to person, place, time, and situation. AFFECT: pleasant, lucid thought and speech. Chest is clear, no wheezing or rales. Normal symmetric air entry throughout both lung fields. No chest wall deformities or tenderness.  MMSE today: 23/30.  LABS:    Chemistry      Component Value Date/Time   NA 141 09/09/2017 1202   K 4.8 09/09/2017 1202   CL 105 09/09/2017 1202   CO2 28 09/09/2017 1202   BUN 17 09/09/2017 1202   CREATININE 1.02 09/09/2017 1202   CREATININE 1.03 02/08/2017 1631      Component Value Date/Time   CALCIUM 9.5 09/09/2017 1202   ALKPHOS  78 09/09/2017 1202   AST 15 09/09/2017 1202   ALT 11 09/09/2017 1202   BILITOT 0.5 09/09/2017 1202     IMPRESSION AND PLAN:  Dementia w/out behavioral disturbance: memory impairment + unable to drive and unable to do financial responsibilities  of his household.  Gets help with medication administration from his wife.  Otherwise independent in ADL's. However, he is often noncompliant with eating SLOW--and when he gets too much in a hurry he sometimes will choke and have to regurgitate a large amount of food.  No symptoms of esoph stenosis---he simply eats too fast.  His dementia is not changed in 1 mo on namenda + aricept. We'll give him 3 more months on namenda and aricept at full dosing of both and see him back. If not improved at that time, we'll need to discuss getting off one or both of these meds, +/- neuro consult.  Spent 25 min with pt today, with >50% of this time spent in counseling and care coordination regarding the above problems.  An After Visit Summary was printed and given to the patient.  FOLLOW UP: Return in about 3 months (around 01/07/2018) for f/u dementia (30 min).  Signed:  Crissie Sickles, MD           10/07/2017

## 2017-10-14 NOTE — Progress Notes (Signed)
Patient ID: Ricardo Washington, male   DOB: 21-Jul-1935, 82 y.o.   MRN: [id number]   Ricardo Washington is seen today f/u CAD  CABG 03/21/11  by Dr Roxan Hockey. He had a lima to lad, svg PDA/PLA and svg OM. His LV functoin is preserved. He is active He is a member of a gym in [place] and will continue there for the new year.  He has not had any SSCP, palpitations or dizzyness. He has been compliant with his meds. Samples of Crestor were given since he is in the donut hole.  He has multiple somatic complaints including lower back pain. He was told it is ok to go out in hot weather, do yard work and golf.  Chol good Eating too much and too much fat.  Still with frequency Encouraged him to see [facility] and have PSA checked  Has been in [place] with son but wants to live in [place].  Started on Aricept and pulse was a bit low Beta blocker decreased and improved  08/2016 some atypical chest pain and f/u myovue 08/28/16 non ischemic low risk EF 58%   Celebrated his 83 th anniversary recently Married his high school sweetheart. Has 3 kids on in [place], [place]  ROS: Denies fever, malais, weight loss, blurry vision, decreased visual acuity, cough, sputum, SOB, hemoptysis, pleuritic pain, palpitaitons, heartburn, abdominal pain, melena, lower extremity edema, claudication, or rash.  All other systems reviewed and negative  General: BP 106/64   Pulse 62   Ht 5' 3"  (1.6 m)   Wt 175 lb (79.4 kg)   SpO2 98%   BMI 31.00 kg/m  Affect appropriate Obese white male  HEENT: hearing aids  Neck supple with no adenopathy JVP normal no bruits no thyromegaly Lungs clear with no wheezing and good diaphragmatic motion Heart:  S1/S2 no murmur, no rub, gallop or click PMI normal Abdomen: benighn, BS positve, no tenderness, no AAA no bruit.  No HSM or HJR Distal pulses intact with no bruits No edema Neuro non-focal Skin warm and dry No muscular weakness    Current Outpatient Medications  Medication Sig Dispense  Refill  . allopurinol (ZYLOPRIM) 100 MG tablet TAKE ONE TABLET BY MOUTH ON MONDAY, WEDNESDAY, AND FRIDAY 30 tablet 11  . ALPRAZolam (NIRAVAM) 0.25 MG dissolvable tablet Take 0.25 mg by mouth at bedtime as needed for anxiety.    Marland Kitchen aspirin 325 MG tablet Take 325 mg by mouth daily.      . ferrous sulfate 325 (65 FE) MG EC tablet Take 325 mg by mouth daily with breakfast.    . finasteride (PROSCAR) 5 MG tablet TAKE ONE TABLET BY MOUTH DAILY 90 tablet 1  . indomethacin (INDOCIN) 25 MG capsule Take 25 mg by mouth 2 (two) times daily as needed.    . Lancets (ONETOUCH ULTRASOFT) lancets Use to check blood sugar once daily 100 each 12  . lisinopril (PRINIVIL,ZESTRIL) 5 MG tablet TAKE ONE TABLET BY MOUTH DAILY 90 tablet 1  . memantine (NAMENDA) 10 MG tablet Take 1 tablet (10 mg total) 2 (two) times daily by mouth. 60 tablet 3  . memantine (NAMENDA) 5 MG tablet Take 2 tablets by mouth 2 (two) times daily.  0  . metFORMIN (GLUCOPHAGE-XR) 500 MG 24 hr tablet TAKE ONE TABLET BY MOUTH TWICE DAILY WITH MEALS 180 tablet 1  . metoprolol tartrate (LOPRESSOR) 25 MG tablet TAKE ONE TABLET BY MOUTH TWICE DAILY 180 tablet 0  . MULTIPLE VITAMIN PO Take 1 tablet by mouth  daily.     . oxybutynin (DITROPAN-XL) 10 MG 24 hr tablet TAKE ONE TABLET BY MOUTH DAILY 90 tablet 1  . rosuvastatin (CRESTOR) 10 MG tablet TAKE ONE TABLET BY MOUTH DAILY 90 tablet 1  . tamsulosin (FLOMAX) 0.4 MG CAPS capsule TAKE ONE CAPSULE BY MOUTH DAILY 90 capsule 1  . Trospium Chloride 60 MG CP24 TAKE ONE CAPSULE BY MOUTH DAILY 90 capsule 3  . vitamin B-12 (CYANOCOBALAMIN) 1000 MCG tablet Take 1,000 mcg by mouth daily.    . nitroGLYCERIN (NITROSTAT) 0.4 MG SL tablet Place 1 tablet (0.4 mg total) under the tongue every 5 (five) minutes as needed for chest pain. 25 tablet 3   No current facility-administered medications for this visit.     Allergies  Sulfonamide derivatives  Electrocardiogram:  NSR rate 70 normal ECG  07/18/15  SR rate 51   Normal otherwise 08/20/16   SR rate 55 normal   Assessment and Plan CAD/CABG: 2012  Low risk non ischemic myovue 08/28/16 EF 58% continue medical Rx    DM: Discussed low carb diet.  A1c 6.1 09/09/17 .  Continue current medications.  Prostatism  F/u primary check PSA  HTN: Well controlled.  Continue current medications and low sodium Dash type diet.    Chol:   Lab Results  Component Value Date   LDLCALC 50 07/04/2016  Indicates labs in [place] have been fine  Bradycardia:  On Aricept now better on lower dose lopressor   F/u with me in a year     [employer]

## 2017-10-17 ENCOUNTER — Encounter: Payer: Self-pay | Admitting: Cardiovascular Disease

## 2017-10-17 ENCOUNTER — Ambulatory Visit (INDEPENDENT_AMBULATORY_CARE_PROVIDER_SITE_OTHER): Payer: Medicare Other | Admitting: Cardiovascular Disease

## 2017-10-17 VITALS — BP 106/64 | HR 62 | Ht 63.0 in | Wt 175.0 lb

## 2017-10-17 DIAGNOSIS — Z951 Presence of aortocoronary bypass graft: Secondary | ICD-10-CM | POA: Diagnosis not present

## 2017-10-17 NOTE — Patient Instructions (Addendum)

## 2017-10-20 ENCOUNTER — Encounter: Payer: Self-pay | Admitting: Family Medicine

## 2017-10-25 ENCOUNTER — Encounter: Payer: Self-pay | Admitting: Family Medicine

## 2017-10-25 NOTE — Telephone Encounter (Signed)
Please advise. Thanks.  

## 2017-11-04 ENCOUNTER — Other Ambulatory Visit: Payer: Self-pay | Admitting: Family Medicine

## 2017-11-13 ENCOUNTER — Other Ambulatory Visit: Payer: Self-pay | Admitting: Family Medicine

## 2017-11-25 ENCOUNTER — Other Ambulatory Visit: Payer: Self-pay | Admitting: Family Medicine

## 2017-12-06 ENCOUNTER — Other Ambulatory Visit: Payer: Self-pay | Admitting: Family Medicine

## 2017-12-11 DIAGNOSIS — E1351 Other specified diabetes mellitus with diabetic peripheral angiopathy without gangrene: Secondary | ICD-10-CM | POA: Diagnosis not present

## 2017-12-11 DIAGNOSIS — L602 Onychogryphosis: Secondary | ICD-10-CM | POA: Diagnosis not present

## 2018-01-07 ENCOUNTER — Encounter: Payer: Self-pay | Admitting: Family Medicine

## 2018-01-07 ENCOUNTER — Ambulatory Visit (INDEPENDENT_AMBULATORY_CARE_PROVIDER_SITE_OTHER): Payer: Medicare Other | Admitting: Family Medicine

## 2018-01-07 VITALS — BP 125/74 | HR 70 | Temp 97.4°F | Resp 16 | Wt 179.0 lb

## 2018-01-07 DIAGNOSIS — I10 Essential (primary) hypertension: Secondary | ICD-10-CM

## 2018-01-07 DIAGNOSIS — F0390 Unspecified dementia without behavioral disturbance: Secondary | ICD-10-CM | POA: Diagnosis not present

## 2018-01-07 DIAGNOSIS — E7800 Pure hypercholesterolemia, unspecified: Secondary | ICD-10-CM | POA: Diagnosis not present

## 2018-01-07 DIAGNOSIS — E119 Type 2 diabetes mellitus without complications: Secondary | ICD-10-CM | POA: Diagnosis not present

## 2018-01-07 DIAGNOSIS — H6123 Impacted cerumen, bilateral: Secondary | ICD-10-CM | POA: Diagnosis not present

## 2018-01-07 LAB — BASIC METABOLIC PANEL
BUN: 21 mg/dL (ref 6–23)
CHLORIDE: 106 meq/L (ref 96–112)
CO2: 30 meq/L (ref 19–32)
Calcium: 9.3 mg/dL (ref 8.4–10.5)
Creatinine, Ser: 1.07 mg/dL (ref 0.40–1.50)
GFR: 70.49 mL/min (ref >60.00)
Glucose, Bld: 104 mg/dL — ABNORMAL HIGH (ref 70–99)
POTASSIUM: 4.3 meq/L (ref 3.5–5.1)
SODIUM: 141 meq/L (ref 135–145)

## 2018-01-07 LAB — LIPID PANEL
CHOL/HDL RATIO: 2
Cholesterol: 123 mg/dL (ref 0–200)
HDL: 52.4 mg/dL (ref >39.00)
LDL CALC: 52 mg/dL (ref 0–99)
NonHDL: 70.17
TRIGLYCERIDES: 89 mg/dL (ref 0.0–149.0)
VLDL: 17.8 mg/dL (ref 0.0–40.0)

## 2018-01-07 LAB — HEMOGLOBIN A1C: HEMOGLOBIN A1C: 6.1 % (ref 4.6–6.5)

## 2018-01-07 MED ORDER — BETAMETHASONE DIPROPIONATE 0.05 % EX CREA: TOPICAL_CREAM | CUTANEOUS | 2 refills | Status: DC

## 2018-01-07 NOTE — Patient Instructions (Signed)
Stop your metronidazole cream  I have sent in a new prescription for betamethasone cream to your pharmacy---apply this only to skin RASH (not to skin that is just itchy but without a rash).

## 2018-01-07 NOTE — Progress Notes (Signed)
OFFICE VISIT  01/07/2018   CC:  Chief Complaint  Patient presents with  . Follow-up    dementia   HPI:    Patient is a 83 y.o. Caucasian male with DM 2, HTN, and hyperlipidemia who presents by himself for 3 mo f/u dementia. Last visit he was not feeling any improvement after being on namenda + aricept x 1 mo.  We decided to continue these meds a few more months. He has not had brain imaging or seen a neurologist.  He says he thinks dementia meds are helping--he has actually NOT been taking aricept.  Denies side effects from these meds.  DM: occ checking fasting: 120 this morning.  Usually 100-120 fasting. HTN: rare bp check 120/60 at home. HLD: tolerating statin, compliant with daily dose.  Says he went to get hearing aid replacements and was told ears too full of wax---asks to get this removed today.  Has times of itchy skin after shower--has been applying betamethasone cream indiscrimately.  Says this was orig rx'd by a derm MD for a rash.  Pt denies rash, though.  Also has metronidazole cream : ? Applying this to itchy skin, too--not clear.  He has no rosacea.   Past Medical History:  Diagnosis Date  . BPH with obstruction/lower urinary tract symptoms 10/27/2015   Dr. Karsten Ro  . Cataract   . Coronary artery disease    with preserved LV function.  . Dementia   . Diabetes mellitus without complication ([place])   . Diverticulosis of colon    severe, entire colon  . Ectatic abdominal aorta ([place]) 01/2017   Abd u/s: 2.9 cm aortic ectasia--at risk for aneurism development.  Recheck aortic u/s 5 yrs.  . Erectile dysfunction due to arterial insufficiency   . Fatigue   . Gout    always 2nd toe L foot (uric acid 6.20 Dec 2014 per old records)  . Hepatic steatosis   . History of adenomatous polyp of colon 2002  . History of stomach ulcers   . Hyperlipidemia   . Hypertension   . Hypogonadism male   . Klebsiella sepsis ([place]) 01/2017   due to acute biliary tract infection (no  stones) and acute diverticulitis.  . Macrocytic anemia 01/2017   vit B12 borderline low and iron borderline low: checking hemoccults and starting vit B12 PO and iron PO as of 02/11/17.  . Myogenic ptosis of bilateral eyelids 2018   Plastic surgery in [place], [place] to do surg as of 03/2017.  Marland Kitchen NASH (nonalcoholic steatohepatitis) 06/2017   LFTs up, abd u/s showed fatty liver but no other abnormality.  . Past use of tobacco    quit 1984  . Rectal bleeding   . Rosacea   . S/P coronary artery bypass graft x 3   . Urine incontinence    Dr. Karsten Ro    Past Surgical History:  Procedure Laterality Date  . CARDIOVASCULAR STRESS TEST  08/28/2016   Low risk myoview, normal EF, no ischemia.  Marland Kitchen CATARACT EXTRACTION, BILATERAL    . COLONOSCOPY  2002, 2006, 02/25/2009   Polyp x 1 2002, none 2006 or 2010  . CORONARY ARTERY BYPASS GRAFT  06/2009   descending,saphenous vein graft to first obtuse marginal, sequential saphenous vein graft to posterior descending and posterolateral  . Endoscopic vein harvest right thigh    . PTCA    . TONSILLECTOMY    . UMBILICAL HERNIA REPAIR     2009  . VASECTOMY      Outpatient  Medications Prior to Visit  Medication Sig Dispense Refill  . aspirin 325 MG tablet Take 325 mg by mouth daily.      . ferrous sulfate 325 (65 FE) MG EC tablet Take 325 mg by mouth daily with breakfast.    . finasteride (PROSCAR) 5 MG tablet TAKE ONE TABLET BY MOUTH DAILY 90 tablet 1  . Lancets (ONETOUCH ULTRASOFT) lancets Use to check blood sugar once daily 100 each 12  . lisinopril (PRINIVIL,ZESTRIL) 5 MG tablet TAKE ONE TABLET BY MOUTH DAILY 90 tablet 1  . memantine (NAMENDA) 10 MG tablet Take 1 tablet (10 mg total) 2 (two) times daily by mouth. 60 tablet 3  . metFORMIN (GLUCOPHAGE-XR) 500 MG 24 hr tablet TAKE ONE TABLET BY MOUTH TWICE DAILY WITH MEALS 180 tablet 1  . metoprolol tartrate (LOPRESSOR) 25 MG tablet TAKE ONE TABLET BY MOUTH TWICE DAILY 180 tablet 0  . MULTIPLE VITAMIN PO Take 1  tablet by mouth daily.     . rosuvastatin (CRESTOR) 10 MG tablet TAKE ONE TABLET BY MOUTH DAILY 90 tablet 1  . tamsulosin (FLOMAX) 0.4 MG CAPS capsule TAKE ONE CAPSULE BY MOUTH DAILY 90 capsule 1  . Trospium Chloride 60 MG CP24 TAKE ONE CAPSULE BY MOUTH DAILY 90 capsule 3  . vitamin B-12 (CYANOCOBALAMIN) 1000 MCG tablet Take 1,000 mcg by mouth daily.    Marland Kitchen ALPRAZolam (NIRAVAM) 0.25 MG dissolvable tablet Take 0.25 mg by mouth at bedtime as needed for anxiety.    . nitroGLYCERIN (NITROSTAT) 0.4 MG SL tablet Place 1 tablet (0.4 mg total) under the tongue every 5 (five) minutes as needed for chest pain. 25 tablet 3  . allopurinol (ZYLOPRIM) 100 MG tablet TAKE ONE TABLET BY MOUTH ON MONDAY, WEDNESDAY, AND FRIDAY (Patient not taking: Reported on 01/07/2018) 30 tablet 11  . indomethacin (INDOCIN) 25 MG capsule Take 25 mg by mouth 2 (two) times daily as needed.    . memantine (NAMENDA) 5 MG tablet Take 2 tablets by mouth 2 (two) times daily.  0  . oxybutynin (DITROPAN-XL) 10 MG 24 hr tablet TAKE ONE TABLET BY MOUTH DAILY (Patient not taking: Reported on 01/07/2018) 90 tablet 1   No facility-administered medications prior to visit.     Allergies  Allergen Reactions  . Sulfonamide Derivatives     Unknown, per pt and "cant stand it"    ROS As per HPI  PE: Blood pressure 125/74, pulse 70, temperature (!) 97.4 F (36.3 C), temperature source Oral, resp. rate 16, weight 179 lb (81.2 kg), SpO2 97 %. Gen: Alert, well appearing.  Patient is oriented to person, place, time, and situation. AFFECT: pleasant, lucid thought and speech. EARS: cerumen impactions bilat. CV: RRR, no m/r/g.   LUNGS: CTA bilat, nonlabored resps, good aeration in all lung fields. EXT: no clubbing, cyanosis, or edema.  SKIN: no rash.   LABS:  Lab Results  Component Value Date   TSH 4.76 (H) 09/09/2017   Lab Results  Component Value Date   WBC 6.1 07/08/2017   HGB 13.2 07/08/2017   HCT 39.4 07/08/2017   MCV 104.5 (H)  07/08/2017   PLT 174.0 07/08/2017   Lab Results  Component Value Date   VITAMINB12 539 03/08/2017   Lab Results  Component Value Date   IRON 70 03/08/2017   IRON 76 03/08/2017   TIBC 316 03/08/2017   FERRITIN 55.0 03/08/2017    Lab Results  Component Value Date   CREATININE 1.02 09/09/2017   BUN 17 09/09/2017  NA 141 09/09/2017   K 4.8 09/09/2017   CL 105 09/09/2017   CO2 28 09/09/2017   Lab Results  Component Value Date   ALT 11 09/09/2017   AST 15 09/09/2017   ALKPHOS 78 09/09/2017   BILITOT 0.5 09/09/2017   Lab Results  Component Value Date   CHOL 109 07/04/2016   Lab Results  Component Value Date   HDL 45.90 07/04/2016   Lab Results  Component Value Date   LDLCALC 50 07/04/2016   Lab Results  Component Value Date   TRIG 68.0 07/04/2016   Lab Results  Component Value Date   CHOLHDL 2 07/04/2016   Lab Results  Component Value Date   HGBA1C 6.1 09/09/2017    IMPRESSION AND PLAN:  1) Dementia w/out behav disturbance: doing ok, slightly improved on namenda 51m bid. No change in med today.  2) DM 2: occ home check is good. HbA1c today. Foot exam next f/u visit.  3) HTN: The current medical regimen is effective;  continue present plan and medications. Lytes/cr today.  4) HLD: tolerating statin.  LDL at goal (50) 06/2016. Repeat FLP today.  5) cerumen impaction bilat: L cleared with curette and TM was normal. Right irrigated by CMA today  6) Itchy skin: recommended he use the betamethasone cream ONLY if he has any itchy rash/dermatitis. Stop metronidazole cream--he has no indication for this at this time. Instructions: Stop your metronidazole cream  I have sent in a new prescription for betamethasone cream to your pharmacy---apply this only to skin RASH (not to skin that is just itchy but without a rash).  An After Visit Summary was printed and given to the patient.  FOLLOW UP: Return in about 3 months (around 04/06/2018) for routine  chronic illness f/u.  Signed:  PCrissie Sickles MD           01/07/2018

## 2018-01-13 ENCOUNTER — Other Ambulatory Visit: Payer: Self-pay | Admitting: Family Medicine

## 2018-01-13 NOTE — Telephone Encounter (Signed)
[facility]  RF request for memantine LOV: 01/07/18 Next ov: 04/09/18 Last written: 10/07/17 #60 w/ 3RF  Please advise. Thanks.

## 2018-02-11 ENCOUNTER — Other Ambulatory Visit: Payer: Self-pay | Admitting: Family Medicine

## 2018-02-11 NOTE — Telephone Encounter (Signed)
Oxybutynin no long on med list. Looks like it was d/c on 01/07/18 but I did not see documentation as to why it was d/c. Please advise. Thanks.   LOV: 01/07/18 NOV: 04/09/18  RF request for tamsulosin Last written: 11/13/17 #90 w/ 1RF   RF request for namenda denied, new Rx was sent on 01/13/18 for 29m tab BID.

## 2018-02-11 NOTE — Telephone Encounter (Signed)
Oxybutynin denied: he is on trospium at this time (this takes the place of oxybutynin). Tamsulosin RF'd.

## 2018-02-12 ENCOUNTER — Ambulatory Visit (INDEPENDENT_AMBULATORY_CARE_PROVIDER_SITE_OTHER): Payer: Medicare Other | Admitting: Family Medicine

## 2018-02-12 ENCOUNTER — Encounter: Payer: Self-pay | Admitting: Family Medicine

## 2018-02-12 VITALS — BP 115/62 | HR 82 | Temp 97.7°F | Resp 16 | Ht 63.0 in | Wt 178.0 lb

## 2018-02-12 DIAGNOSIS — J069 Acute upper respiratory infection, unspecified: Secondary | ICD-10-CM | POA: Diagnosis not present

## 2018-02-12 DIAGNOSIS — B9789 Other viral agents as the cause of diseases classified elsewhere: Secondary | ICD-10-CM

## 2018-02-12 NOTE — Patient Instructions (Signed)
Get otc generic robitussin DM OR Mucinex DM and use as directed on the packaging for cough and congestion. Use otc generic saline nasal spray 2-3 times per day to irrigate/moisturize your nasal passages.  Drink lots of fluids! Rest! Stop metformin and rosuvastatin until you are feeling well again.

## 2018-02-12 NOTE — Progress Notes (Signed)
OFFICE VISIT  02/12/2018   CC:  Chief Complaint  Patient presents with  . Cough   HPI:    Patient is a 83 y.o. Caucasian male who presents accompanied by his wife for cough. Onset of nasal congestion, PND, cough, rattly, feels mucous in back of throat, +ST, no HA.  Denies SOB, wheezing, or chest tightness.  NO fevers. Took some otc cold med w/out decongestant, also mucinex.  Appetite is fine.  Drinking fluids ok--as per normal. No n/v/d. Recently traveled to [place] for a wedding, very tiring.  This came on when he got back. Wife with same sx's for about a week now.  Past Medical History:  Diagnosis Date  . BPH with obstruction/lower urinary tract symptoms 10/27/2015   Dr. Karsten Ro  . Cataract   . Coronary artery disease    with preserved LV function.  . Dementia   . Diabetes mellitus without complication ([place])   . Diverticulosis of colon    severe, entire colon  . Ectatic abdominal aorta ([place]) 01/2017   Abd u/s: 2.9 cm aortic ectasia--at risk for aneurism development.  Recheck aortic u/s 5 yrs.  . Erectile dysfunction due to arterial insufficiency   . Fatigue   . Gout    always 2nd toe L foot (uric acid 6.20 Dec 2014 per old records)  . Hepatic steatosis   . History of adenomatous polyp of colon 2002  . History of stomach ulcers   . Hyperlipidemia   . Hypertension   . Hypogonadism male   . Klebsiella sepsis ([place]) 01/2017   due to acute biliary tract infection (no stones) and acute diverticulitis.  . Macrocytic anemia 01/2017   vit B12 borderline low and iron borderline low: checking hemoccults and starting vit B12 PO and iron PO as of 02/11/17.  . Myogenic ptosis of bilateral eyelids 2018   Plastic surgery in [place], [place] to do surg as of 03/2017.  Marland Kitchen NASH (nonalcoholic steatohepatitis) 06/2017   LFTs up, abd u/s showed fatty liver but no other abnormality.  . Past use of tobacco    quit 1984  . Rectal bleeding   . Rosacea   . S/P coronary artery bypass graft x 3   .  Urine incontinence    Dr. Karsten Ro    Past Surgical History:  Procedure Laterality Date  . CARDIOVASCULAR STRESS TEST  08/28/2016   Low risk myoview, normal EF, no ischemia.  Marland Kitchen CATARACT EXTRACTION, BILATERAL    . COLONOSCOPY  2002, 2006, 02/25/2009   Polyp x 1 2002, none 2006 or 2010  . CORONARY ARTERY BYPASS GRAFT  06/2009   descending,saphenous vein graft to first obtuse marginal, sequential saphenous vein graft to posterior descending and posterolateral  . Endoscopic vein harvest right thigh    . PTCA    . TONSILLECTOMY    . UMBILICAL HERNIA REPAIR     2009  . VASECTOMY      Outpatient Medications Prior to Visit  Medication Sig Dispense Refill  . ALPRAZolam (NIRAVAM) 0.25 MG dissolvable tablet Take 0.25 mg by mouth at bedtime as needed for anxiety.    Marland Kitchen aspirin 325 MG tablet Take 325 mg by mouth daily.      . betamethasone dipropionate (DIPROLENE) 0.05 % cream Apply to affected areas twice daily AS NEEDED for itchy RASH 45 g 2  . ferrous sulfate 325 (65 FE) MG EC tablet Take 325 mg by mouth daily with breakfast.    . finasteride (PROSCAR) 5 MG tablet TAKE ONE  TABLET BY MOUTH DAILY 90 tablet 1  . Lancets (ONETOUCH ULTRASOFT) lancets Use to check blood sugar once daily 100 each 12  . lisinopril (PRINIVIL,ZESTRIL) 5 MG tablet TAKE ONE TABLET BY MOUTH DAILY 90 tablet 1  . memantine (NAMENDA) 10 MG tablet Take 1 tablet (10 mg total) 2 (two) times daily by mouth. 60 tablet 11  . metFORMIN (GLUCOPHAGE-XR) 500 MG 24 hr tablet TAKE ONE TABLET BY MOUTH TWICE DAILY WITH MEALS 180 tablet 1  . metoprolol tartrate (LOPRESSOR) 25 MG tablet TAKE ONE TABLET BY MOUTH TWICE DAILY 180 tablet 0  . MULTIPLE VITAMIN PO Take 1 tablet by mouth daily.     . rosuvastatin (CRESTOR) 10 MG tablet TAKE ONE TABLET BY MOUTH DAILY 90 tablet 1  . tamsulosin (FLOMAX) 0.4 MG CAPS capsule TAKE ONE CAPSULE BY MOUTH DAILY 90 capsule 3  . Trospium Chloride 60 MG CP24 TAKE ONE CAPSULE BY MOUTH DAILY 90 capsule 3  .  vitamin B-12 (CYANOCOBALAMIN) 1000 MCG tablet Take 1,000 mcg by mouth daily.    . nitroGLYCERIN (NITROSTAT) 0.4 MG SL tablet Place 1 tablet (0.4 mg total) under the tongue every 5 (five) minutes as needed for chest pain. 25 tablet 3   No facility-administered medications prior to visit.     Allergies  Allergen Reactions  . Sulfonamide Derivatives     Unknown, per pt and "cant stand it"    ROS As per HPI  PE: Blood pressure 115/62, pulse 82, temperature 97.7 F (36.5 C), temperature source Oral, resp. rate 16, height 5' 3"  (1.6 m), weight 178 lb (80.7 kg), SpO2 92 %. VS: noted--normal. Gen: alert, NAD, NONTOXIC APPEARING. HEENT: eyes without injection, drainage, or swelling.  Ears: EACs clear, TMs with normal light reflex and landmarks.  Mouth is dry.  Nose: Clear rhinorrhea, with some dried, crusty exudate adherent to mildly injected mucosa.  No purulent d/c.  No paranasal sinus TTP.  No facial swelling.  Throat and mouth without focal lesion.  No pharyngial swelling, erythema, or exudate.   Neck: supple, no LAD.   LUNGS: CTA bilat, nonlabored resps.   CV: RRR, no m/r/g. EXT: no c/c/e SKIN: no rash   LABS:    Chemistry      Component Value Date/Time   NA 141 01/07/2018 1001   K 4.3 01/07/2018 1001   CL 106 01/07/2018 1001   CO2 30 01/07/2018 1001   BUN 21 01/07/2018 1001   CREATININE 1.07 01/07/2018 1001   CREATININE 1.03 02/08/2017 1631      Component Value Date/Time   CALCIUM 9.3 01/07/2018 1001   ALKPHOS 78 09/09/2017 1202   AST 15 09/09/2017 1202   ALT 11 09/09/2017 1202   BILITOT 0.5 09/09/2017 1202     Lab Results  Component Value Date   WBC 6.1 07/08/2017   HGB 13.2 07/08/2017   HCT 39.4 07/08/2017   MCV 104.5 (H) 07/08/2017   PLT 174.0 07/08/2017    IMPRESSION AND PLAN:  Viral uri with cough. Get otc generic robitussin DM OR Mucinex DM and use as directed on the packaging for cough and congestion. Use otc generic saline nasal spray 2-3 times per  day to irrigate/moisturize your nasal passages. Signs/symptoms to call or return for were reviewed and pt expressed understanding.  Stop metformin and rosuvastatin until feeling well again.  An After Visit Summary was printed and given to the patient.  FOLLOW UP: Return if symptoms worsen or fail to improve.  Signed:  Crissie Sickles, MD  02/12/2018      

## 2018-02-13 DIAGNOSIS — L602 Onychogryphosis: Secondary | ICD-10-CM | POA: Diagnosis not present

## 2018-02-13 DIAGNOSIS — E1351 Other specified diabetes mellitus with diabetic peripheral angiopathy without gangrene: Secondary | ICD-10-CM | POA: Diagnosis not present

## 2018-02-17 ENCOUNTER — Other Ambulatory Visit: Payer: Self-pay | Admitting: Family Medicine

## 2018-04-07 ENCOUNTER — Ambulatory Visit: Payer: Medicare Other

## 2018-04-07 NOTE — Progress Notes (Signed)
Subjective:   Ricardo Washington is a 83 y.o. male who presents for Medicare Annual/Subsequent preventive examination.  Review of Systems:  No ROS.  Medicare Wellness Visit. Additional risk factors are reflected in the social history.  Cardiac Risk Factors include: advanced age (>63mn, >>61women);obesity (BMI >30kg/m2);sedentary lifestyle;hypertension;diabetes mellitus;dyslipidemia;male gender   Sleep patterns: Sleeps well.  Home Safety/Smoke Alarms: Feels safe in home. Smoke alarms in place.  Living environment; residence and Firearm Safety: Lives with wife in 1 story home, [place]: Wears seat belt.    Male:   CCS-Colonoscopy 02/25/2009, Diverticulosis. Recall 10 years     PSA- Followed by [facility].      Objective:    Vitals: BP 120/68 (BP Location: Left Arm, Patient Position: Sitting, Cuff Size: Normal)   Pulse 60   Ht 5' 3"  (1.6 m)   Wt 175 lb 6 oz (79.5 kg)   SpO2 97%   BMI 31.07 kg/m   Body mass index is 31.07 kg/m.  Advanced Directives 04/08/2018 03/08/2017 01/31/2017 01/30/2017  Does Patient Have a Medical Advance Directive? Yes Yes No No  Type of Advance Directive Living will Living will - -  Would patient like information on creating a medical advance directive? - - No - Patient declined -    Tobacco Social History   Tobacco Use  Smoking Status Former Smoker  . Packs/day: 1.00  . Years: 30.00  . Pack years: 30.00  . Types: Cigarettes  . Last attempt to quit: 03/15/1983  . Years since quitting: 35.0  Smokeless Tobacco Never Used  Tobacco Comment   Quit 1984     Counseling given: Not Answered Comment: Quit 1984   Past Medical History:  Diagnosis Date  . BPH with obstruction/lower urinary tract symptoms 10/27/2015   Dr. [place]arsten Ro . Cataract   . Coronary artery disease    with preserved LV function.  . Dementia   . Diabetes mellitus without complication ([facility]   . Diverticulosis of colon    severe,  entire colon  . Ectatic abdominal aorta ([place] 01/2017   Abd u/s: 2.9 cm aortic ectasia--at risk for aneurism development.  Recheck aortic u/s 5 yrs.  . Erectile dysfunction due to arterial insufficiency   . Fatigue   . Gout    always 2nd toe L foot (uric acid 6.20 Dec 2014 per old records)  . Hepatic steatosis   . History of adenomatous polyp of colon 2002  . History of stomach ulcers   . Hyperlipidemia   . Hypertension   . Hypogonadism male   . Klebsiella sepsis ([facility] 01/2017   due to acute biliary tract infection (no stones) and acute diverticulitis.  . Macrocytic anemia 01/2017   vit B12 borderline low and iron borderline low: checking hemoccults and starting vit B12 PO and iron PO as of 02/11/17.  . Myogenic ptosis of bilateral eyelids 2018   [place].  .Marland KitchenNASH (nonalcoholic steatohepatitis) 06/2017   LFTs up, abd u/s showed fatty liver but no other abnormality.  . Past use of tobacco    quit 1984  . Rectal bleeding   . Rosacea   . S/P coronary artery bypass graft x 3   . Urine incontinence    Dr. [place]arsten Ro  Past Surgical History:  Procedure Laterality Date  . CARDIOVASCULAR STRESS TEST  08/28/2016   Low risk myoview, normal EF, no ischemia.  .Marland Kitchen  CATARACT EXTRACTION, BILATERAL    . COLONOSCOPY  2002, 2006, 02/25/2009   Polyp x 1 2002, none 2006 or 2010  . CORONARY ARTERY BYPASS GRAFT  06/2009   descending,saphenous vein graft to first obtuse marginal, sequential saphenous vein graft to posterior descending and posterolateral  . Endoscopic vein harvest right thigh    . PTCA    . TONSILLECTOMY    . UMBILICAL HERNIA REPAIR     2009  . VASECTOMY     Family History  Problem Relation Age of Onset  . Cancer Mother   . Cancer Father        pt points to LLQ as  area of cancer, so potentially could have been intestinal.      Social History   Socioeconomic History  . Marital status: Married    Spouse name: Not on file  . Number of  children: 3  . Years of education: 56  . Highest education level: Not on file  Occupational History  . Not on file  Social Needs  . Financial resource strain: Not on file  . Food insecurity:    Worry: Not on file    Inability: Not on file  . Transportation needs:    Medical: Not on file    Non-medical: Not on file  Tobacco Use  . Smoking status: Former Smoker    Packs/day: 1.00    Years: 30.00    Pack years: 30.00    Types: Cigarettes    Last attempt to quit: 03/15/1983    Years since quitting: 35.0  . Smokeless tobacco: Never Used  . Tobacco comment: Quit 1984  Substance and Sexual Activity  . Alcohol use: Yes    Alcohol/week: 3.0 - 4.2 oz    Types: 5 - 7 Glasses of wine per week    Comment: quit etoh 1999 but in ~ 2017 began having a glass of wine with dinner   . Drug use: No  . Sexual activity: Never  Lifestyle  . Physical activity:    Days per week: Not on file    Minutes per session: Not on file  . Stress: Not on file  Relationships  . Social connections:    Talks on phone: Not on file    Gets together: Not on file    Attends religious service: Not on file    Active member of club or organization: Not on file    Attends meetings of clubs or organizations: Not on file    Relationship status: Not on file  Other Topics Concern  . Not on file  Social History Narrative   Married 1957, has 3 sons.   [place]. 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    End of life Care: DNR, no prolonged heroic measures or prolonged supportive care.    Former smoker, quit 1984.   Quit alcohol when dx'd with DM in 2009. ~ 2017 began having glass of wine with dinner.   No exercise.    Outpatient Encounter Medications as of 04/08/2018  Medication Sig  . allopurinol (ZYLOPRIM) 100 MG tablet TAKE ONE TABLET BY MOUTH ON MONDAY, WEDNESDAY, AND FRIDAY  . aspirin 325 MG tablet Take 325 mg by mouth daily.    .  betamethasone dipropionate (DIPROLENE) 0.05 % cream Apply to affected areas twice daily AS NEEDED for itchy RASH  . ferrous sulfate 325 (65 FE) MG EC tablet Take 325 mg by mouth daily with breakfast.  .  finasteride (PROSCAR) 5 MG tablet TAKE ONE TABLET BY MOUTH DAILY  . Lancets (ONETOUCH ULTRASOFT) lancets Use to check blood sugar once daily  . lisinopril (PRINIVIL,ZESTRIL) 5 MG tablet TAKE ONE TABLET BY MOUTH DAILY  . memantine (NAMENDA) 10 MG tablet Take 1 tablet (10 mg total) 2 (two) times daily by mouth.  . metFORMIN (GLUCOPHAGE-XR) 500 MG 24 hr tablet TAKE ONE TABLET BY MOUTH TWICE DAILY WITH MEALS  . metoprolol tartrate (LOPRESSOR) 25 MG tablet TAKE ONE TABLET BY MOUTH TWICE DAILY  . MULTIPLE VITAMIN PO Take 1 tablet by mouth daily.   . rosuvastatin (CRESTOR) 10 MG tablet TAKE ONE TABLET BY MOUTH DAILY  . tamsulosin (FLOMAX) 0.4 MG CAPS capsule TAKE ONE CAPSULE BY MOUTH DAILY  . Trospium Chloride 60 MG CP24 TAKE ONE CAPSULE BY MOUTH DAILY  . vitamin B-12 (CYANOCOBALAMIN) 1000 MCG tablet Take 1,000 mcg by mouth daily.  Marland Kitchen ALPRAZolam (NIRAVAM) 0.25 MG dissolvable tablet Take 0.25 mg by mouth at bedtime as needed for anxiety.  . nitroGLYCERIN (NITROSTAT) 0.4 MG SL tablet Place 1 tablet (0.4 mg total) under the tongue every 5 (five) minutes as needed for chest pain.   No facility-administered encounter medications on file as of 04/08/2018.     Activities of Daily Living In your present state of health, do you have any difficulty performing the following activities: 04/08/2018  Hearing? N  Vision? N  Difficulty concentrating or making decisions? Y  Walking or climbing stairs? N  Dressing or bathing? N  Doing errands, shopping? N  Preparing Food and eating ? N  Using the Toilet? N  In the past six months, have you accidently leaked urine? N  Do you have problems with loss of bowel control? N  Managing your Medications? N  Managing your Finances? N  Housekeeping or managing your  Housekeeping? N  Some recent data might be hidden    Patient Care Team: [name], MD as PCP - General (Family Medicine) Josue Hector, MD (Cardiology) Kathie Rhodes, MD as Consulting Physician (Urology) Lavonna Monarch, MD as Consulting Physician (Dermatology)   Assessment:   This is a routine wellness examination for Amon.  Exercise Activities and Dietary recommendations Current Exercise Habits: The patient does not participate in regular exercise at present, Exercise limited by: None identified   Diet (meal preparation, eat out, water intake, caffeinated beverages, dairy products, fruits and vegetables): Drinks diet sprite.   Breakfast: Cheerios, muffin, occasional coffee.  Lunch: hotdog Dinner: protein and veggies.       Goals    . Patient Stated     Maintain current health.        Fall Risk Fall Risk  04/08/2018 03/08/2017 10/02/2016 04/27/2015  Falls in the past year? No No No No    Depression Screen PHQ 2/9 Scores 04/08/2018 03/08/2017 10/02/2016 04/27/2015  PHQ - 2 Score 0 0 0 0    Cognitive Function MMSE - Mini Mental State Exam 04/08/2018 03/08/2017  Orientation to time 2 5  Orientation to Place 5 5  Registration 3 3  Attention/ Calculation 3 4  Recall 0 0  Language- name 2 objects 2 2  Language- repeat 0 1  Language- follow 3 step command 3 3  Language- read & follow direction 1 1  Write a sentence 1 1  Copy design 1 1  Total score 21 26        Immunization History  Administered Date(s) Administered  . Influenza Split 07/30/2012  . Influenza Whole  07/27/2011  . Influenza, High Dose Seasonal PF 08/10/2016, 09/05/2017  . Influenza,inj,Quad PF,6+ Mos 08/24/2015  . Influenza-Unspecified 08/19/2013  . Pneumococcal Conjugate-13 04/27/2015  . Pneumococcal Polysaccharide-23 11/19/2006, 07/02/2016  . Td 08/05/2012  . Zoster 11/19/2008  . Zoster Recombinat (Shingrix) 07/10/2017, 12/06/2017    Screening Tests Health Maintenance  Topic Date Due    . OPHTHALMOLOGY EXAM  12/14/2016  . FOOT EXAM  11/01/2017  . INFLUENZA VACCINE  06/19/2018  . HEMOGLOBIN A1C  07/07/2018  . TETANUS/TDAP  08/05/2022  . PNA vac Low Risk Adult  Completed      Plan:    Schedule eye exam.   Bring a copy of your living will and/or healthcare power of attorney to your next office visit.  Continue doing brain stimulating activities (puzzles, reading, adult coloring books, staying active) to keep memory sharp.    I have personally reviewed and noted the following in the patient's chart:   . Medical and social history . Use of alcohol, tobacco or illicit drugs  . Current medications and supplements . Functional ability and status . Nutritional status . Physical activity . Advanced directives . List of other physicians . Hospitalizations, surgeries, and ER visits in previous 12 months . Vitals . Screenings to include cognitive, depression, and falls . Referrals and appointments  In addition, I have reviewed and discussed with patient certain preventive protocols, quality metrics, and best practice recommendations. A written personalized care plan for preventive services as well as general preventive health recommendations were provided to patient.     Gerilyn Nestle, RN  04/08/2018  PCP Notes: -MMSE=21 -F/U with PCP 04/09/2018 (preferred to have foot exam at this appt)

## 2018-04-08 ENCOUNTER — Ambulatory Visit (INDEPENDENT_AMBULATORY_CARE_PROVIDER_SITE_OTHER): Payer: Medicare Other

## 2018-04-08 ENCOUNTER — Other Ambulatory Visit: Payer: Self-pay

## 2018-04-08 VITALS — BP 120/68 | HR 60 | Ht 63.0 in | Wt 175.4 lb

## 2018-04-08 DIAGNOSIS — Z0000 Encounter for general adult medical examination without abnormal findings: Secondary | ICD-10-CM

## 2018-04-08 NOTE — Progress Notes (Signed)
AWV reviewed and agree. Signed:  Crissie Sickles, MD           04/08/2018

## 2018-04-08 NOTE — Patient Instructions (Addendum)
Schedule eye exam.   Bring a copy of your living will and/or healthcare power of attorney to your next office visit.  Continue doing brain stimulating activities (puzzles, reading, adult coloring books, staying active) to keep memory sharp.    Health Maintenance, Male A healthy lifestyle and preventive care is important for your health and wellness. Ask your health care provider about what schedule of regular examinations is right for you. What should I know about weight and diet? Eat a Healthy Diet  Eat plenty of vegetables, fruits, whole grains, low-fat dairy products, and lean protein.  Do not eat a lot of foods high in solid fats, added sugars, or salt.  Maintain a Healthy Weight Regular exercise can help you achieve or maintain a healthy weight. You should:  Do at least 150 minutes of exercise each week. The exercise should increase your heart rate and make you sweat (moderate-intensity exercise).  Do strength-training exercises at least twice a week.  Watch Your Levels of Cholesterol and Blood Lipids  Have your blood tested for lipids and cholesterol every 5 years starting at 81 years of age. If you are at high risk for heart disease, you should start having your blood tested when you are 81 years old. You may need to have your cholesterol levels checked more often if: ? Your lipid or cholesterol levels are high. ? You are older than 81 years of age. ? You are at high risk for heart disease.  What should I know about cancer screening? Many types of cancers can be detected early and may often be prevented. Lung Cancer  You should be screened every year for lung cancer if: ? You are a current smoker who has smoked for at least 30 years. ? You are a former smoker who has quit within the past 15 years.  Talk to your health care provider about your screening options, when you should start screening, and how often you should be screened.  Colorectal Cancer  Routine  colorectal cancer screening usually begins at 81 years of age and should be repeated every 5-10 years until you are 81 years old. You may need to be screened more often if early forms of precancerous polyps or small growths are found. Your health care provider may recommend screening at an earlier age if you have risk factors for colon cancer.  Your health care provider may recommend using home test kits to check for hidden blood in the stool.  A small camera at the end of a tube can be used to examine your colon (sigmoidoscopy or colonoscopy). This checks for the earliest forms of colorectal cancer.  Prostate and Testicular Cancer  Depending on your age and overall health, your health care provider may do certain tests to screen for prostate and testicular cancer.  Talk to your health care provider about any symptoms or concerns you have about testicular or prostate cancer.  Skin Cancer  Check your skin from head to toe regularly.  Tell your health care provider about any new moles or changes in moles, especially if: ? There is a change in a mole's size, shape, or color. ? You have a mole that is larger than a pencil eraser.  Always use sunscreen. Apply sunscreen liberally and repeat throughout the day.  Protect yourself by wearing long sleeves, pants, a wide-brimmed hat, and sunglasses when outside.  What should I know about heart disease, diabetes, and high blood pressure?  If you are 82-72 years of age, have  your blood pressure checked every 3-5 years. If you are 53 years of age or older, have your blood pressure checked every year. You should have your blood pressure measured twice-once when you are at a hospital or clinic, and once when you are not at a hospital or clinic. Record the average of the two measurements. To check your blood pressure when you are not at a hospital or clinic, you can use: ? An automated blood pressure machine at a pharmacy. ? A home blood pressure  monitor.  Talk to your health care provider about your target blood pressure.  If you are between 38-37 years old, ask your health care provider if you should take aspirin to prevent heart disease.  Have regular diabetes screenings by checking your fasting blood sugar level. ? If you are at a normal weight and have a low risk for diabetes, have this test once every three years after the age of 1. ? If you are overweight and have a high risk for diabetes, consider being tested at a younger age or more often.  A one-time screening for abdominal aortic aneurysm (AAA) by ultrasound is recommended for men aged 73-75 years who are current or former smokers. What should I know about preventing infection? Hepatitis B If you have a higher risk for hepatitis B, you should be screened for this virus. Talk with your health care provider to find out if you are at risk for hepatitis B infection. Hepatitis C Blood testing is recommended for:  Everyone born from [birth year] through 1963.  Anyone with known risk factors for hepatitis C.  Sexually Transmitted Diseases (STDs)  You should be screened each year for STDs including gonorrhea and chlamydia if: ? You are sexually active and are younger than 81 years of age. ? You are older than 81 years of age and your health care provider tells you that you are at risk for this type of infection. ? Your sexual activity has changed since you were last screened and you are at an increased risk for chlamydia or gonorrhea. Ask your health care provider if you are at risk.  Talk with your health care provider about whether you are at high risk of being infected with HIV. Your health care provider may recommend a prescription medicine to help prevent HIV infection.  What else can I do?  Schedule regular health, dental, and eye exams.  Stay current with your vaccines (immunizations).  Do not use any tobacco products, such as cigarettes, chewing tobacco, and  e-cigarettes. If you need help quitting, ask your health care provider.  Limit alcohol intake to no more than 2 drinks per day. One drink equals 12 ounces of beer, 5 ounces of wine, or 1 ounces of hard liquor.  Do not use street drugs.  Do not share needles.  Ask your health care provider for help if you need support or information about quitting drugs.  Tell your health care provider if you often feel depressed.  Tell your health care provider if you have ever been abused or do not feel safe at home. This information is not intended to replace advice given to you by your health care provider. Make sure you discuss any questions you have with your health care provider. Document Released: 05/03/2008 Document Revised: 07/04/2016 Document Reviewed: 08/09/2015 Elsevier Interactive Patient Education  [employer].

## 2018-04-09 ENCOUNTER — Encounter: Payer: Self-pay | Admitting: Family Medicine

## 2018-04-09 ENCOUNTER — Ambulatory Visit (INDEPENDENT_AMBULATORY_CARE_PROVIDER_SITE_OTHER): Payer: Medicare Other | Admitting: Family Medicine

## 2018-04-09 VITALS — BP 111/60 | HR 62 | Temp 97.5°F | Resp 16 | Ht 63.0 in | Wt 175.5 lb

## 2018-04-09 DIAGNOSIS — E119 Type 2 diabetes mellitus without complications: Secondary | ICD-10-CM

## 2018-04-09 DIAGNOSIS — E669 Obesity, unspecified: Secondary | ICD-10-CM | POA: Diagnosis not present

## 2018-04-09 DIAGNOSIS — I10 Essential (primary) hypertension: Secondary | ICD-10-CM | POA: Diagnosis not present

## 2018-04-09 DIAGNOSIS — F0390 Unspecified dementia without behavioral disturbance: Secondary | ICD-10-CM

## 2018-04-09 LAB — BASIC METABOLIC PANEL
BUN: 17 mg/dL (ref 6–23)
CHLORIDE: 105 meq/L (ref 96–112)
CO2: 30 mEq/L (ref 19–32)
Calcium: 9.1 mg/dL (ref 8.4–10.5)
Creatinine, Ser: 1.09 mg/dL (ref 0.40–1.50)
GFR: 68.95 mL/min (ref >60.00)
Glucose, Bld: 125 mg/dL — ABNORMAL HIGH (ref 70–99)
Potassium: 4.3 mEq/L (ref 3.5–5.1)
SODIUM: 141 meq/L (ref 135–145)

## 2018-04-09 LAB — HEMOGLOBIN A1C: HEMOGLOBIN A1C: 6.2 % (ref 4.6–6.5)

## 2018-04-09 NOTE — Progress Notes (Signed)
OFFICE VISIT  04/09/2018   CC:  Chief Complaint  Patient presents with  . Follow-up    RCI, pt is not fasting.    HPI:    Patient is a 83 y.o. Caucasian male who presents for 3 mo f/u dementia w/out behavioral disturbance, DM 2, HTN. He was improved regarding dementia at last visit on just namenda 82m bid--he had stopped taking aricept. Compliant with the med, says he thinks it is helping, question of whether it is helping better than aricept. Still having trouble with short term memory. No trouble getting lost, still drives fine.    DM 2: fastings around 100.  Since he lost 40 lbs about 5-6 yrs ago (purposefully), he has had MUCH improved glucoses.  Takes metformin xr 500 mg bid. Feet: no pain, numbness, or tingling in feet.  HTN: no home bp checks "in a while".  In the past he recalls 120s/60s.  ROS: no feet ulcers, no CP, no dizziness, no palpitations, no SOB, no LE swelling, no HAs, no polydipsia or polyuria.  Past Medical History:  Diagnosis Date  . BPH with obstruction/lower urinary tract symptoms 10/27/2015   Dr. [place]arsten Ro . Cataract   . Coronary artery disease    with preserved LV function.  . Dementia   . Diabetes mellitus without complication ([place]   . Diverticulosis of colon    severe, entire colon  . Ectatic abdominal aorta ([place] 01/2017   Abd u/s: 2.9 cm aortic ectasia--at risk for aneurism development.  Recheck aortic u/s 5 yrs.  . Erectile dysfunction due to arterial insufficiency   . Fatigue   . Gout    always 2nd toe L foot (uric acid 6.20 Dec 2014 per old records)  . Hepatic steatosis   . History of adenomatous polyp of colon 2002  . History of stomach ulcers   . Hyperlipidemia   . Hypertension   . Hypogonadism male   . Klebsiella sepsis ([place] 01/2017   due to acute biliary tract infection (no stones) and acute diverticulitis.  . Macrocytic anemia 01/2017   vit B12 borderline low and iron borderline low: checking hemoccults and starting vit B12 PO  and iron PO as of 02/11/17.  . Myogenic ptosis of bilateral eyelids 2018   [place]  .Marland KitchenNASH (nonalcoholic steatohepatitis) 06/2017   LFTs up, abd u/s showed fatty liver but no other abnormality.  . Past use of tobacco    quit 1984  . Rectal bleeding   . Rosacea   . S/P coronary artery bypass graft x 3   . Urine incontinence    Dr. OKarsten Ro   Past Surgical History:  Procedure Laterality Date  . CARDIOVASCULAR STRESS TEST  08/28/2016   Low risk myoview, normal EF, no ischemia.  .Marland KitchenCATARACT EXTRACTION, BILATERAL    . COLONOSCOPY  2002, 2006, 02/25/2009   Polyp x 1 2002, none 2006 or 2010  . CORONARY ARTERY BYPASS GRAFT  06/2009   descending,saphenous vein graft to first obtuse marginal, sequential saphenous vein graft to posterior descending and posterolateral  . Endoscopic vein harvest right thigh    . PTCA    . TONSILLECTOMY    . UMBILICAL HERNIA REPAIR     2009  . VASECTOMY      Outpatient Medications Prior to Visit  Medication Sig Dispense Refill  . allopurinol (ZYLOPRIM) 100 MG tablet TAKE ONE TABLET BY MOUTH ON MONDAY, WEDNESDAY, AND FRIDAY  11  .  ALPRAZolam (NIRAVAM) 0.25 MG dissolvable tablet Take 0.25 mg by mouth at bedtime as needed for anxiety.    Marland Kitchen aspirin 325 MG tablet Take 325 mg by mouth daily.      . betamethasone dipropionate (DIPROLENE) 0.05 % cream Apply to affected areas twice daily AS NEEDED for itchy RASH 45 g 2  . ferrous sulfate 325 (65 FE) MG EC tablet Take 325 mg by mouth daily with breakfast.    . finasteride (PROSCAR) 5 MG tablet TAKE ONE TABLET BY MOUTH DAILY 90 tablet 1  . Lancets (ONETOUCH ULTRASOFT) lancets Use to check blood sugar once daily 100 each 12  . lisinopril (PRINIVIL,ZESTRIL) 5 MG tablet TAKE ONE TABLET BY MOUTH DAILY 90 tablet 1  . memantine (NAMENDA) 10 MG tablet Take 1 tablet (10 mg total) 2 (two) times daily by mouth. 60 tablet 11  . metFORMIN (GLUCOPHAGE-XR) 500 MG 24 hr tablet TAKE ONE  TABLET BY MOUTH TWICE DAILY WITH MEALS 180 tablet 1  . metoprolol tartrate (LOPRESSOR) 25 MG tablet TAKE ONE TABLET BY MOUTH TWICE DAILY 180 tablet 0  . MULTIPLE VITAMIN PO Take 1 tablet by mouth daily.     . rosuvastatin (CRESTOR) 10 MG tablet TAKE ONE TABLET BY MOUTH DAILY 90 tablet 1  . tamsulosin (FLOMAX) 0.4 MG CAPS capsule TAKE ONE CAPSULE BY MOUTH DAILY 90 capsule 3  . Trospium Chloride 60 MG CP24 TAKE ONE CAPSULE BY MOUTH DAILY 90 capsule 3  . vitamin B-12 (CYANOCOBALAMIN) 1000 MCG tablet Take 1,000 mcg by mouth daily.    . nitroGLYCERIN (NITROSTAT) 0.4 MG SL tablet Place 1 tablet (0.4 mg total) under the tongue every 5 (five) minutes as needed for chest pain. 25 tablet 3   No facility-administered medications prior to visit.     Allergies  Allergen Reactions  . Sulfonamide Derivatives     Unknown, per pt and "cant stand it"    ROS As per HPI  PE: Blood pressure 111/60, pulse 62, temperature (!) 97.5 F (36.4 C), temperature source Oral, resp. rate 16, height 5' 3"  (1.6 m), weight 175 lb 8 oz (79.6 kg), SpO2 97 %. Gen: Alert, well appearing.  Patient is oriented to person, place, time, and situation. AFFECT: pleasant, lucid thought and speech. Foot exam - no swelling, tenderness or skin or vascular lesions. Color and temperature is normal. Sensation is diminished to monofilament testing in lateral aspect of plantar surface and toes 3-5 bilat. Peripheral pulses are palpable. Toenails are normal.   LABS:    Chemistry      Component Value Date/Time   NA 141 01/07/2018 1001   K 4.3 01/07/2018 1001   CL 106 01/07/2018 1001   CO2 30 01/07/2018 1001   BUN 21 01/07/2018 1001   CREATININE 1.07 01/07/2018 1001   CREATININE 1.03 02/08/2017 1631      Component Value Date/Time   CALCIUM 9.3 01/07/2018 1001   ALKPHOS 78 09/09/2017 1202   AST 15 09/09/2017 1202   ALT 11 09/09/2017 1202   BILITOT 0.5 09/09/2017 1202     01/07/18 GFR approx 70 ml/min  Lab Results  Component  Value Date   HGBA1C 6.1 01/07/2018   Lab Results  Component Value Date   CHOL 123 01/07/2018   HDL 52.40 01/07/2018   LDLCALC 52 01/07/2018   LDLDIRECT 85.1 06/23/2008   TRIG 89.0 01/07/2018   CHOLHDL 2 01/07/2018     IMPRESSION AND PLAN:  1) Dementia without behavioral disturbance: doing fine, pt happy with namenda  68m bid and does not want to add aricept at this time. He is highly functional.  2) DM 2: good control. HbA1c today. Feet exam showed some loss of protective sensation on plantar surfaces bilat.  3) HTN: The current medical regimen is effective;  continue present plan and medications. BMET today.  An After Visit Summary was printed and given to the patient.  FOLLOW UP: Return in about 3 months (around 07/10/2018) for routine chronic illness f/u.  Signed:  PCrissie Sickles MD           04/09/2018

## 2018-04-15 ENCOUNTER — Other Ambulatory Visit: Payer: Self-pay | Admitting: Dermatology

## 2018-04-15 ENCOUNTER — Other Ambulatory Visit: Payer: Self-pay

## 2018-04-15 DIAGNOSIS — D0421 Carcinoma in situ of skin of right ear and external auricular canal: Secondary | ICD-10-CM | POA: Diagnosis not present

## 2018-04-15 DIAGNOSIS — L570 Actinic keratosis: Secondary | ICD-10-CM | POA: Diagnosis not present

## 2018-04-15 MED ORDER — ONETOUCH ULTRASOFT LANCETS MISC: 12 refills | Status: AC

## 2018-04-19 DIAGNOSIS — K639 Disease of intestine, unspecified: Secondary | ICD-10-CM

## 2018-04-19 HISTORY — DX: Disease of intestine, unspecified: K63.9

## 2018-04-24 DIAGNOSIS — L602 Onychogryphosis: Secondary | ICD-10-CM | POA: Diagnosis not present

## 2018-04-24 DIAGNOSIS — E1351 Other specified diabetes mellitus with diabetic peripheral angiopathy without gangrene: Secondary | ICD-10-CM | POA: Diagnosis not present

## 2018-04-30 ENCOUNTER — Telehealth: Payer: Self-pay | Admitting: *Deleted

## 2018-04-30 ENCOUNTER — Other Ambulatory Visit: Payer: Self-pay

## 2018-04-30 ENCOUNTER — Emergency Department (HOSPITAL_BASED_OUTPATIENT_CLINIC_OR_DEPARTMENT_OTHER)
Admission: EM | Admit: 2018-04-30 | Discharge: 2018-04-30 | Disposition: A | Discharge status: Home / Self Care | Payer: Medicare Other | Attending: Emergency Medicine | Admitting: Emergency Medicine

## 2018-04-30 ENCOUNTER — Encounter (HOSPITAL_BASED_OUTPATIENT_CLINIC_OR_DEPARTMENT_OTHER): Payer: Self-pay

## 2018-04-30 ENCOUNTER — Emergency Department (HOSPITAL_BASED_OUTPATIENT_CLINIC_OR_DEPARTMENT_OTHER): Payer: Medicare Other

## 2018-04-30 DIAGNOSIS — I2510 Atherosclerotic heart disease of native coronary artery without angina pectoris: Secondary | ICD-10-CM | POA: Insufficient documentation

## 2018-04-30 DIAGNOSIS — Z951 Presence of aortocoronary bypass graft: Secondary | ICD-10-CM | POA: Diagnosis not present

## 2018-04-30 DIAGNOSIS — F0390 Unspecified dementia without behavioral disturbance: Secondary | ICD-10-CM | POA: Diagnosis not present

## 2018-04-30 DIAGNOSIS — I10 Essential (primary) hypertension: Secondary | ICD-10-CM | POA: Diagnosis not present

## 2018-04-30 DIAGNOSIS — K5732 Diverticulitis of large intestine without perforation or abscess without bleeding: Secondary | ICD-10-CM | POA: Diagnosis not present

## 2018-04-30 DIAGNOSIS — R7989 Other specified abnormal findings of blood chemistry: Secondary | ICD-10-CM

## 2018-04-30 DIAGNOSIS — R109 Unspecified abdominal pain: Secondary | ICD-10-CM | POA: Diagnosis not present

## 2018-04-30 DIAGNOSIS — Z87891 Personal history of nicotine dependence: Secondary | ICD-10-CM | POA: Diagnosis not present

## 2018-04-30 DIAGNOSIS — E119 Type 2 diabetes mellitus without complications: Secondary | ICD-10-CM | POA: Diagnosis not present

## 2018-04-30 DIAGNOSIS — R945 Abnormal results of liver function studies: Secondary | ICD-10-CM | POA: Insufficient documentation

## 2018-04-30 DIAGNOSIS — R1032 Left lower quadrant pain: Secondary | ICD-10-CM | POA: Diagnosis not present

## 2018-04-30 DIAGNOSIS — R112 Nausea with vomiting, unspecified: Secondary | ICD-10-CM | POA: Diagnosis not present

## 2018-04-30 DIAGNOSIS — R9431 Abnormal electrocardiogram [ECG] [EKG]: Secondary | ICD-10-CM | POA: Diagnosis not present

## 2018-04-30 LAB — CBC WITH DIFFERENTIAL/PLATELET
BASOS PCT: 0 %
Basophils Absolute: 0 10*3/uL (ref 0.0–0.1)
EOS ABS: 0 10*3/uL (ref 0.0–0.7)
EOS PCT: 0 %
HCT: 40.9 % (ref 39.0–52.0)
HEMOGLOBIN: 14.3 g/dL (ref 13.0–17.0)
Lymphocytes Relative: 6 %
Lymphs Abs: 0.6 10*3/uL — ABNORMAL LOW (ref 0.7–4.0)
MCH: 34.9 pg — ABNORMAL HIGH (ref 26.0–34.0)
MCHC: 35 g/dL (ref 30.0–36.0)
MCV: 99.8 fL (ref 78.0–100.0)
Monocytes Absolute: 0.6 10*3/uL (ref 0.1–1.0)
Monocytes Relative: 6 %
NEUTROS PCT: 88 %
Neutro Abs: 8.9 10*3/uL — ABNORMAL HIGH (ref 1.7–7.7)
PLATELETS: 172 10*3/uL (ref 150–400)
RBC: 4.1 MIL/uL — AB (ref 4.22–5.81)
RDW: 14.5 % (ref 11.5–15.5)
WBC: 10 10*3/uL (ref 4.0–10.5)

## 2018-04-30 LAB — COMPREHENSIVE METABOLIC PANEL
ALK PHOS: 127 U/L — AB (ref 38–126)
ALT: 143 U/L — ABNORMAL HIGH (ref 17–63)
ANION GAP: 11 (ref 5–15)
AST: 384 U/L — ABNORMAL HIGH (ref 15–41)
Albumin: 4.2 g/dL (ref 3.5–5.0)
BILIRUBIN TOTAL: 1.6 mg/dL — AB (ref 0.3–1.2)
BUN: 23 mg/dL — ABNORMAL HIGH (ref 6–20)
CALCIUM: 9.2 mg/dL (ref 8.9–10.3)
CO2: 23 mmol/L (ref 22–32)
Chloride: 106 mmol/L (ref 101–111)
Creatinine, Ser: 1.23 mg/dL (ref 0.61–1.24)
GFR calc Af Amer: >60 mL/min (ref >60)
GFR calc non Af Amer: 53 mL/min — ABNORMAL LOW (ref >60)
Glucose, Bld: 142 mg/dL — ABNORMAL HIGH (ref 65–99)
Potassium: 4 mmol/L (ref 3.5–5.1)
SODIUM: 140 mmol/L (ref 135–145)
TOTAL PROTEIN: 7.3 g/dL (ref 6.5–8.1)

## 2018-04-30 LAB — LIPASE, BLOOD: LIPASE: 29 U/L (ref 11–51)

## 2018-04-30 LAB — URINALYSIS, ROUTINE W REFLEX MICROSCOPIC
Glucose, UA: NEGATIVE mg/dL
HGB URINE DIPSTICK: NEGATIVE
KETONES UR: 40 mg/dL — AB
LEUKOCYTES UA: NEGATIVE
Nitrite: NEGATIVE
PROTEIN: NEGATIVE mg/dL
Specific Gravity, Urine: 1.025 (ref 1.005–1.030)
pH: 6 (ref 5.0–8.0)

## 2018-04-30 LAB — TROPONIN I

## 2018-04-30 MED ORDER — IOPAMIDOL (ISOVUE-300) INJECTION 61%
100.0000 mL | Freq: Once | INTRAVENOUS | Status: AC | PRN
  Administered 2018-04-30: 100 mL via INTRAVENOUS

## 2018-04-30 MED ORDER — ONDANSETRON 4 MG PO TBDP: 4.0000 mg | ORAL_TABLET | Freq: Three times a day (TID) | ORAL | 0 refills | Status: DC | PRN

## 2018-04-30 MED ORDER — AMOXICILLIN-POT CLAVULANATE 875-125 MG PO TABS: 1.0000 | ORAL_TABLET | Freq: Two times a day (BID) | ORAL | 0 refills | Status: AC

## 2018-04-30 MED ORDER — TRAMADOL HCL 50 MG PO TABS: 50.0000 mg | ORAL_TABLET | Freq: Four times a day (QID) | ORAL | 0 refills | Status: DC | PRN

## 2018-04-30 MED ORDER — ONDANSETRON HCL 4 MG/2ML IJ SOLN
4.0000 mg | Freq: Once | INTRAMUSCULAR | Status: AC
  Administered 2018-04-30: 4 mg via INTRAVENOUS
  Filled 2018-04-30: qty 2

## 2018-04-30 NOTE — ED Notes (Signed)
Pt unable to provide urine at triage

## 2018-04-30 NOTE — Telephone Encounter (Signed)
Copied from [place] [id number]. Topic: General - Other >> Apr 30, 2018  3:16 PM Oneta Rack wrote: Relation to pt: spouse  Call back number:[phone] mobile   Reason for call:  Spouse wanted to inform PCP she's taking patient to [facility] ED due to gastro concerns.  >> Apr 30, 2018  3:19 PM Oneta Rack wrote: Relation to pt: spouse  Call back number:[phone] mobile   Reason for call:  Spouse wanted to inform PCP she's taking patient to [facility] ED due to gastro concerns.

## 2018-04-30 NOTE — Telephone Encounter (Signed)
Noted  

## 2018-04-30 NOTE — ED Provider Notes (Signed)
Emergency Department Provider Note   I have reviewed the triage vital signs and the nursing notes.   HISTORY  Chief Complaint Abdominal Pain   HPI Ricardo Washington is a 83 y.o. male with PMH of DM, CAD, HLD, HTN, and NASH presents to the emergency department for evaluation of nausea since yesterday evening with abdominal pain worsening throughout the morning.  Patient states that he had some issues with his gallbladder and is advised to have surgery but refused.  He is unsure if this feels similar.  Family at bedside state that he was initially complaining of right flank and back pain and that changed to abdominal pain around lunchtime.  Patient had one episode of emesis this morning that was nonbloody.  He tried to induce additional vomiting because of nausea but was unsuccessful.  No associated diarrhea.  No fevers or chills.  Denies any chest pain or dyspnea.  Past Medical History:  Diagnosis Date  . BPH with obstruction/lower urinary tract symptoms 10/27/2015   Dr. Karsten Ro  . Cataract   . Coronary artery disease    with preserved LV function.  . Dementia   . Diabetes mellitus without complication ([place])   . Diverticulosis of colon    severe, entire colon  . Ectatic abdominal aorta ([place]) 01/2017   Abd u/s: 2.9 cm aortic ectasia--at risk for aneurism development.  Recheck aortic u/s 5 yrs.  . Erectile dysfunction due to arterial insufficiency   . Fatigue   . Gout    always 2nd toe L foot (uric acid 6.20 Dec 2014 per old records)  . Hepatic steatosis   . History of adenomatous polyp of colon 2002  . History of stomach ulcers   . Hyperlipidemia   . Hypertension   . Hypogonadism male   . Klebsiella sepsis ([place]) 01/2017   due to acute biliary tract infection (no stones) and acute diverticulitis.  . Macrocytic anemia 01/2017   vit B12 borderline low and iron borderline low: checking hemoccults and starting vit B12 PO and iron PO as of 02/11/17.  . Myogenic ptosis of bilateral  eyelids 2018   [place] surgery in [place], [place] to do surg as of 03/2017.  Marland Kitchen NASH (nonalcoholic steatohepatitis) 06/2017   LFTs up, abd u/s showed fatty liver but no other abnormality.  . Past use of tobacco    quit 1984  . Rectal bleeding   . Rosacea   . S/P coronary artery bypass graft x 3   . Urine incontinence    Dr. Karsten Ro    Patient Active Problem List   Diagnosis Date Noted  . Sepsis due to Klebsiella pneumoniae ([place]) 02/04/2017  . Acalculous cholecystitis   . Sepsis, unspecified organism ([place]) 01/31/2017  . Acute cholecystitis 01/31/2017  . Diverticulitis of colon 01/31/2017  . BPH with obstruction/lower urinary tract symptoms 10/27/2015  . Memory loss 02/16/2013  . Routine health maintenance 08/05/2012  . BPH (benign prostatic hyperplasia) 01/06/2010  . CATARACT, HX OF 01/06/2010  . Coronary atherosclerosis 08/07/2009  . TOBACCO USE, QUIT 08/07/2009  . CORONARY ARTERY BYPASS GRAFT, THREE VESSEL, HX OF 08/07/2009  . PERCUTANEOUS TRANSLUMINAL CORONARY ANGIOPLASTY, HX OF 08/07/2009  . Diverticulosis of colon 02/22/2009  . Personal history of colonic polyps 02/22/2009  . AODM 08/24/2008  . Diabetes mellitus, type 2 ([place]) 08/24/2008  . Other and unspecified hyperlipidemia 06/23/2008  . HYPOGONADISM 04/16/2007  . GOUT 04/16/2007  . Essential hypertension 04/16/2007    Past Surgical History:  Procedure Laterality Date  .  CARDIOVASCULAR STRESS TEST  08/28/2016   Low risk myoview, normal EF, no ischemia.  Marland Kitchen CATARACT EXTRACTION, BILATERAL    . COLONOSCOPY  2002, 2006, 02/25/2009   Polyp x 1 2002, none 2006 or 2010  . CORONARY ARTERY BYPASS GRAFT  06/2009   descending,saphenous vein graft to first obtuse marginal, sequential saphenous vein graft to posterior descending and posterolateral  . Endoscopic vein harvest right thigh    . PTCA    . TONSILLECTOMY    . UMBILICAL HERNIA REPAIR     2009  . VASECTOMY      Allergies Sulfonamide derivatives  Family History  Problem  Relation Age of Onset  . Cancer Mother   . Cancer Father        pt points to LLQ as  area of cancer, so potentially could have been intestinal.       Social History Social History   Tobacco Use  . Smoking status: Former Smoker    Packs/day: 1.00    Years: 30.00    Pack years: 30.00    Types: Cigarettes    Last attempt to quit: 03/15/1983    Years since quitting: 35.1  . Smokeless tobacco: Never Used  . Tobacco comment: Quit 1984  Substance Use Topics  . Alcohol use: Yes    Alcohol/week: 3.0 - 4.2 oz    Types: 5 - 7 Glasses of wine per week    Comment: quit etoh 1999 but in ~ 2017 began having a glass of wine with dinner   . Drug use: No    Review of Systems  Constitutional: No fever/chills Eyes: No visual changes. ENT: No sore throat. Cardiovascular: Denies chest pain. Respiratory: Denies shortness of breath. Gastrointestinal: Positive abdominal pain. Positive nausea and vomiting.  No diarrhea.  No constipation. Genitourinary: Negative for dysuria. Musculoskeletal: Negative for back pain. Skin: Negative for rash. Neurological: Negative for headaches, focal weakness or numbness.  10-point ROS otherwise negative.  ____________________________________________   PHYSICAL EXAM:  VITAL SIGNS: ED Triage Vitals  Enc Vitals Group     BP 04/30/18 1528 128/87     Pulse Rate 04/30/18 1528 73     Resp 04/30/18 1528 20     Temp 04/30/18 1528 98.1 F (36.7 C)     Temp Source 04/30/18 1528 Oral     SpO2 04/30/18 1528 100 %     Weight 04/30/18 1527 170 lb (77.1 kg)     Height 04/30/18 1527 5' 6"  (1.676 m)     Pain Score 04/30/18 1526 3   Constitutional: Alert and oriented. Well appearing and in no acute distress. Eyes: Conjunctivae are normal.  Head: Atraumatic. Nose: No congestion/rhinnorhea. Mouth/Throat: Mucous membranes are moist.  Oropharynx non-erythematous. Neck: No stridor.   Cardiovascular: Normal rate, regular rhythm. Good peripheral circulation. Grossly  normal heart sounds.   Respiratory: Normal respiratory effort.  No retractions. Lungs CTAB. Gastrointestinal: Soft with focal LLQ tenderness with voluntary guarding. No RUQ tenderness. No distention.  Musculoskeletal: No lower extremity tenderness nor edema. No gross deformities of extremities. Neurologic:  Normal speech and language. No gross focal neurologic deficits are appreciated.  Skin:  Skin is warm, dry and intact. No rash noted.  ____________________________________________   LABS (all labs ordered are listed, but only abnormal results are displayed)  Labs Reviewed  COMPREHENSIVE METABOLIC PANEL - Abnormal; Notable for the following components:      Result Value   Glucose, Bld 142 (*)    BUN 23 (*)    AST  384 (*)    ALT 143 (*)    Alkaline Phosphatase 127 (*)    Total Bilirubin 1.6 (*)    GFR calc non Af Amer 53 (*)    All other components within normal limits  CBC WITH DIFFERENTIAL/PLATELET - Abnormal; Notable for the following components:   RBC 4.10 (*)    MCH 34.9 (*)    Neutro Abs 8.9 (*)    Lymphs Abs 0.6 (*)    All other components within normal limits  URINALYSIS, ROUTINE W REFLEX MICROSCOPIC - Abnormal; Notable for the following components:   Bilirubin Urine SMALL (*)    Ketones, ur 40 (*)    All other components within normal limits  LIPASE, BLOOD  TROPONIN I   ____________________________________________  EKG   EKG Interpretation  Date/Time:  Wednesday April 30 2018 16:00:09 EDT Ventricular Rate:  64 PR Interval:    QRS Duration: 100 QT Interval:  434 QTC Calculation: 448 R Axis:   53 Text Interpretation:  Sinus rhythm Borderline T abnormalities, inferior leads No STEMI.  Confirmed by Nanda Quinton [phone]) on 04/30/2018 4:13:56 PM       ____________________________________________  RADIOLOGY  Ct Abdomen Pelvis W Contrast  Result Date: 04/30/2018 CLINICAL DATA:  83 year old male with history of abdominal pain and emesis since 1 p.m. today.  Evaluate for potential diverticulitis. EXAM: CT ABDOMEN AND PELVIS WITH CONTRAST TECHNIQUE: Multidetector CT imaging of the abdomen and pelvis was performed using the standard protocol following bolus administration of intravenous contrast. CONTRAST:  155m ISOVUE-300 IOPAMIDOL (ISOVUE-300) INJECTION 61% COMPARISON:  No problems. FINDINGS: Lower chest: Aortic atherosclerosis. Atherosclerotic calcifications in the left anterior descending and right coronary arteries. Median sternotomy wires. Small hiatal hernia. Hepatobiliary: No cystic or solid hepatic lesions. There appears to be some regional intrahepatic biliary ductal dilatation most evident in segment 8 of the liver. Ill-defined hypovascular area near the hepatic hilum best appreciated on axial image 21 of series 2, concerning for potential infiltrative lesion. Gallbladder is unremarkable in appearance. Pancreas: No pancreatic mass. No pancreatic ductal dilatation. No pancreatic or peripancreatic fluid or inflammatory changes. Spleen: Unremarkable. Adrenals/Urinary Tract: Bilateral perinephric stranding (nonspecific). Bilateral kidneys and bilateral adrenal glands are otherwise normal in appearance. No hydroureteronephrosis. Urinary bladder is normal in appearance. Stomach/Bowel: Normal appearance of the stomach. No pathologic dilatation of small bowel or colon. Mass-like thickening of the distal sigmoid colon, best appreciated on axial image 59 of series 2. This is in an area of diverticulosis. There are some subtle areas of haziness in the associated mesocolon, which could indicate an acute diverticulitis in this region, however, the findings are very mild. Normal appendix. Vascular/Lymphatic: Aortic atherosclerosis, without evidence of aneurysm or dissection in the abdominal or pelvic vasculature. Mild stenosis in the infrarenal abdominal aorta. Borderline enlarged portacaval lymph node measuring 12 mm in short axis. No other lymphadenopathy noted in the  abdomen or pelvis. Reproductive: Prostate gland and seminal vesicles are unremarkable in appearance. Other: No significant volume of ascites.  No pneumoperitoneum. Musculoskeletal: There are no aggressive appearing lytic or blastic lesions noted in the visualized portions of the skeleton. IMPRESSION: 1. There are findings in the distal sigmoid colon which could be related to acute diverticulitis. However, there is also mass-like thickening of the colon throughout this region. This may simply be reflective of a combination of acute and chronic diverticular disease, however, correlation with nonemergent colonoscopy is recommended after resolution of the patient's acute illness to exclude the possibility of colorectal neoplasm. 2. Focal intrahepatic biliary  ductal dilatation most evident in segment 8 of the liver where there is also some regional atrophy. Ill-defined hypovascular area near the hepatic hilum. The possibility of a central neoplasm should be considered, and further evaluation with nonemergent MRI of the abdomen with and without IV gadolinium is strongly recommended in the near future. 3. Aortic atherosclerosis, in addition to 2 vessel coronary artery disease. 4. Additional incidental findings, as above. Electronically Signed   By: Vinnie Langton M.D.   On: 04/30/2018 19:38    ____________________________________________   PROCEDURES  Procedure(s) performed:   Procedures  None ____________________________________________   INITIAL IMPRESSION / ASSESSMENT AND PLAN / ED COURSE  Pertinent labs & imaging results that were available during my care of the patient were reviewed by me and considered in my medical decision making (see chart for details).  Patient presents to the emergency department for evaluation of abdominal and back pain which is mostly resolved at this point.  Associated nausea and vomiting.  Very low suspicion for atypical ACS presentation but will obtain troponin.  EKG  reviewed with no acute findings.  Patient has primarily left lower quadrant abdominal pain and tenderness on exam.  Given his age and presentation plan for CT imaging of the abdomen and pelvis.  Low suspicion for gallbladder disease.  Patient with elevated LFT and bilirubin without pain in the RUQ. No nausea, vomiting, or fever. CT shows possible liver mass with biliary dilation in the liver. Normal gallbladder and CBD. Suspect liver abnormalities 2/2 to possible liver mass. Patient also noted to have uncomplicated diverticulitis with colon thickening in that area. Discussed all findings with patient and family. He is feeling very well here. Offered admission for IV abx and abdominal MRI as recommended by Radiology. The patient and family (wife and son) would prefer to treat diverticulitis as an outpatient and arrange outpatient MRI through their PCP. Also discussed need for follow up colonoscopy after diverticulitis is treated. Sent patient's PCP a message in Epic outlining the labs and CT findings. Had a Deborrah Mabin risk/benefit discuss with patient and family including reasons to return to the ED immediately.   I have reviewed and discussed all results (EKG, imaging, lab, urine as appropriate), exam findings with patient. I have reviewed nursing notes and appropriate previous records.  I feel the patient is safe to be discharged home without further emergent workup. Discussed usual and customary return precautions. Patient and family (if present) verbalize understanding and are comfortable with this plan.  Patient will follow-up with their primary care provider. If they do not have a primary care provider, information for follow-up has been provided to them. All questions have been answered.  ____________________________________________  FINAL CLINICAL IMPRESSION(S) / ED DIAGNOSES  Final diagnoses:  Diverticulitis of large intestine without perforation or abscess without bleeding  Elevated LFTs      MEDICATIONS GIVEN DURING THIS VISIT:  Medications  ondansetron (ZOFRAN) injection 4 mg (4 mg Intravenous Given 04/30/18 1606)  iopamidol (ISOVUE-300) 61 % injection 100 mL (100 mLs Intravenous Contrast Given 04/30/18 1843)     NEW OUTPATIENT MEDICATIONS STARTED DURING THIS VISIT:  Discharge Medication List as of 04/30/2018  8:21 PM    START taking these medications   Details  amoxicillin-clavulanate (AUGMENTIN) 875-125 MG tablet Take 1 tablet by mouth every 12 (twelve) hours for 10 days., Starting Wed 04/30/2018, Until Sat 05/10/2018, Print    ondansetron (ZOFRAN ODT) 4 MG disintegrating tablet Take 1 tablet (4 mg total) by mouth every 8 (eight) hours as  needed for nausea or vomiting., Starting Wed 04/30/2018, Print    traMADol (ULTRAM) 50 MG tablet Take 1 tablet (50 mg total) by mouth every 6 (six) hours as needed for severe pain., Starting Wed 04/30/2018, Print        Note:  This document was prepared using Dragon voice recognition software and may include unintentional dictation errors.  Nanda Quinton, MD Emergency Medicine    Shreyan Hinz, [facility], MD 05/01/18 1130

## 2018-04-30 NOTE — ED Notes (Signed)
ED Provider at bedside. 

## 2018-04-30 NOTE — ED Triage Notes (Signed)
C/o abd pain vomited x 1-started approx 1pm-states feels same as GB pain in the past-to triage in w/c

## 2018-04-30 NOTE — Discharge Instructions (Signed)
You were seen in the ED today with abdominal pain. You have diverticulitis which we are treating with antibiotics. Call your PCP in the AM to schedule an outpatient appointment for MRI of the abdomen and to refer you for a colonoscopy. Return to the ED immediately with any worsening pain, fever, chills, or other concerning symptoms.

## 2018-04-30 NOTE — Telephone Encounter (Signed)
FYI

## 2018-05-01 ENCOUNTER — Encounter: Payer: Self-pay | Admitting: Family Medicine

## 2018-05-01 ENCOUNTER — Ambulatory Visit (INDEPENDENT_AMBULATORY_CARE_PROVIDER_SITE_OTHER): Payer: Medicare Other | Admitting: Family Medicine

## 2018-05-01 VITALS — BP 101/53 | HR 70 | Temp 98.0°F | Resp 16 | Ht 63.0 in | Wt 178.5 lb

## 2018-05-01 DIAGNOSIS — R748 Abnormal levels of other serum enzymes: Secondary | ICD-10-CM | POA: Diagnosis not present

## 2018-05-01 DIAGNOSIS — R740 Nonspecific elevation of levels of transaminase and lactic acid dehydrogenase [LDH]: Secondary | ICD-10-CM | POA: Diagnosis not present

## 2018-05-01 DIAGNOSIS — D490 Neoplasm of unspecified behavior of digestive system: Secondary | ICD-10-CM | POA: Diagnosis not present

## 2018-05-01 DIAGNOSIS — K6389 Other specified diseases of intestine: Secondary | ICD-10-CM

## 2018-05-01 DIAGNOSIS — K838 Other specified diseases of biliary tract: Secondary | ICD-10-CM

## 2018-05-01 DIAGNOSIS — R1032 Left lower quadrant pain: Secondary | ICD-10-CM | POA: Diagnosis not present

## 2018-05-01 DIAGNOSIS — K5792 Diverticulitis of intestine, part unspecified, without perforation or abscess without bleeding: Secondary | ICD-10-CM

## 2018-05-01 DIAGNOSIS — R7401 Elevation of levels of liver transaminase levels: Secondary | ICD-10-CM

## 2018-05-01 DIAGNOSIS — K639 Disease of intestine, unspecified: Secondary | ICD-10-CM

## 2018-05-01 NOTE — Progress Notes (Signed)
OFFICE VISIT  05/01/2018   CC:  Chief Complaint  Patient presents with  . Follow-up    ER visit    HPI:    Patient is a 83 y.o. Caucasian male who presents for ER f/u: was seen in ER 04/30/18 (yesterday) for LLQ abd pain that started that day, dx'd with diverticulitis. Had elevated AST, ALT, alk phos, and T bili, and  imaging picked up subtle liver changes concerning for central neoplasm and radiologist recommended nonurgent MRI abd w and w/out contrast. Also, CT abd/pelv picked up mass-like thickening in colon (could be combined acute on chronic diverticular dz) and radiologist recommended colonoscopy after acute illness resolved. He was d/c'd home with augmentin, zofran, and tramadol.  He started the antibiotic this morning.  Has not taken any zofran or tramadol.  Reviewed entire ED record from 04/30/18 today.  Interim Hx: not having any more abdominal pain now--went away by the time he got home from ED last night.  He has eaten a piece of zuchinni caserole and had cheerios and watermelon--all went down fine.  No BM since going home. Says his urine looks really yellow.  Drinks diet sprite a lot but not much water. No fevers.   He has had no abnormal wt loss.    Pertinent imaging: CT abd/pelv w/contrast 04/30/18: IMPRESSION: 1. There are findings in the distal sigmoid colon which could be related to acute diverticulitis. However, there is also mass-like thickening of the colon throughout this region. This may simply be reflective of a combination of acute and chronic diverticular disease, however, correlation with nonemergent colonoscopy is recommended after resolution of the patient's acute illness to exclude the possibility of colorectal neoplasm. 2. Focal intrahepatic biliary ductal dilatation most evident in segment 8 of the liver where there is also some regional atrophy. Ill-defined hypovascular area near the hepatic hilum. The possibility of a central neoplasm should be  considered, and further evaluation with nonemergent MRI of the abdomen with and without IV gadolinium is strongly recommended in the near future. 3. Aortic atherosclerosis, in addition to 2 vessel coronary artery disease.  Past Medical History:  Diagnosis Date  . BPH with obstruction/lower urinary tract symptoms 10/27/2015   Dr. Karsten Ro  . Cataract   . Coronary artery disease    with preserved LV function.  . Dementia   . Diabetes mellitus without complication ([place])   . Diverticulosis of colon    severe, entire colon  . Ectatic abdominal aorta ([place]) 01/2017   Abd u/s: 2.9 cm aortic ectasia--at risk for aneurism development.  Recheck aortic u/s 5 yrs.  . Erectile dysfunction due to arterial insufficiency   . Fatigue   . Gout    always 2nd toe L foot (uric acid 6.20 Dec 2014 per old records)  . Hepatic steatosis   . History of adenomatous polyp of colon 2002  . History of stomach ulcers   . Hyperlipidemia   . Hypertension   . Hypogonadism male   . Klebsiella sepsis ([place]) 01/2017   due to acute biliary tract infection (no stones) and acute diverticulitis.  . Macrocytic anemia 01/2017   vit B12 borderline low and iron borderline low: checking hemoccults and starting vit B12 PO and iron PO as of 02/11/17.  . Myogenic ptosis of bilateral eyelids 2018   Plastic surgery in [place], [place] to do surg as of 03/2017.  Marland Kitchen NASH (nonalcoholic steatohepatitis) 06/2017   LFTs up, abd u/s showed fatty liver but no other abnormality.  Marland Kitchen  Past use of tobacco    quit 1984  . Rectal bleeding   . Rosacea   . S/P coronary artery bypass graft x 3   . Urine incontinence    Dr. Karsten Ro    Past Surgical History:  Procedure Laterality Date  . CARDIOVASCULAR STRESS TEST  08/28/2016   Low risk myoview, normal EF, no ischemia.  Marland Kitchen CATARACT EXTRACTION, BILATERAL    . COLONOSCOPY  2002, 2006, 02/25/2009   Polyp x 1 2002, none 2006 or 2010  . CORONARY ARTERY BYPASS GRAFT  06/2009   descending,saphenous vein  graft to first obtuse marginal, sequential saphenous vein graft to posterior descending and posterolateral  . Endoscopic vein harvest right thigh    . PTCA    . TONSILLECTOMY    . UMBILICAL HERNIA REPAIR     2009  . VASECTOMY      Outpatient Medications Prior to Visit  Medication Sig Dispense Refill  . allopurinol (ZYLOPRIM) 100 MG tablet TAKE ONE TABLET BY MOUTH ON MONDAY, WEDNESDAY, AND FRIDAY  11  . ALPRAZolam (NIRAVAM) 0.25 MG dissolvable tablet Take 0.25 mg by mouth at bedtime as needed for anxiety.    Marland Kitchen amoxicillin-clavulanate (AUGMENTIN) 875-125 MG tablet Take 1 tablet by mouth every 12 (twelve) hours for 10 days. 14 tablet 0  . aspirin 325 MG tablet Take 325 mg by mouth daily.      . betamethasone dipropionate (DIPROLENE) 0.05 % cream Apply to affected areas twice daily AS NEEDED for itchy RASH 45 g 2  . ferrous sulfate 325 (65 FE) MG EC tablet Take 325 mg by mouth daily with breakfast.    . finasteride (PROSCAR) 5 MG tablet TAKE ONE TABLET BY MOUTH DAILY 90 tablet 1  . Lancets (ONETOUCH ULTRASOFT) lancets Use to check blood sugar once daily 100 each 12  . lisinopril (PRINIVIL,ZESTRIL) 5 MG tablet TAKE ONE TABLET BY MOUTH DAILY 90 tablet 1  . memantine (NAMENDA) 10 MG tablet Take 1 tablet (10 mg total) 2 (two) times daily by mouth. 60 tablet 11  . metoprolol tartrate (LOPRESSOR) 25 MG tablet TAKE ONE TABLET BY MOUTH TWICE DAILY 180 tablet 0  . MULTIPLE VITAMIN PO Take 1 tablet by mouth daily.     . ondansetron (ZOFRAN ODT) 4 MG disintegrating tablet Take 1 tablet (4 mg total) by mouth every 8 (eight) hours as needed for nausea or vomiting. 20 tablet 0  . rosuvastatin (CRESTOR) 10 MG tablet TAKE ONE TABLET BY MOUTH DAILY 90 tablet 1  . tamsulosin (FLOMAX) 0.4 MG CAPS capsule TAKE ONE CAPSULE BY MOUTH DAILY 90 capsule 3  . traMADol (ULTRAM) 50 MG tablet Take 1 tablet (50 mg total) by mouth every 6 (six) hours as needed for severe pain. 10 tablet 0  . Trospium Chloride 60 MG CP24 TAKE  ONE CAPSULE BY MOUTH DAILY 90 capsule 3  . vitamin B-12 (CYANOCOBALAMIN) 1000 MCG tablet Take 1,000 mcg by mouth daily.    . metFORMIN (GLUCOPHAGE-XR) 500 MG 24 hr tablet TAKE ONE TABLET BY MOUTH TWICE DAILY WITH MEALS (Patient not taking: Reported on 05/01/2018) 180 tablet 1  . nitroGLYCERIN (NITROSTAT) 0.4 MG SL tablet Place 1 tablet (0.4 mg total) under the tongue every 5 (five) minutes as needed for chest pain. 25 tablet 3   No facility-administered medications prior to visit.     Allergies  Allergen Reactions  . Sulfonamide Derivatives     Unknown, per pt and "cant stand it"    ROS As per HPI  PE: Blood pressure (!) 101/53, pulse 70, temperature 98 F (36.7 C), temperature source Oral, resp. rate 16, height 5' 3"  (1.6 m), weight 178 lb 8 oz (81 kg), SpO2 98 %. Gen: Alert, well appearing.  Patient is oriented to person, place, time, and situation. AFFECT: pleasant, lucid thought and speech.   LABS:    Chemistry      Component Value Date/Time   NA 140 04/30/2018 1539   K 4.0 04/30/2018 1539   CL 106 04/30/2018 1539   CO2 23 04/30/2018 1539   BUN 23 (H) 04/30/2018 1539   CREATININE 1.23 04/30/2018 1539   CREATININE 1.03 02/08/2017 1631      Component Value Date/Time   CALCIUM 9.2 04/30/2018 1539   ALKPHOS 127 (H) 04/30/2018 1539   AST 384 (H) 04/30/2018 1539   ALT 143 (H) 04/30/2018 1539   BILITOT 1.6 (H) 04/30/2018 1539     GFR approx 60 ml/min 04/30/18  Lab Results  Component Value Date   WBC 10.0 04/30/2018   HGB 14.3 04/30/2018   HCT 40.9 04/30/2018   MCV 99.8 04/30/2018   PLT 172 04/30/2018    IMPRESSION AND PLAN:  1) Acute abdominal pain, LLQ-->resolved.  CT scan 04/30/18 with question of focal area of diverticulitis (vs colonic wall mass). Augmentin started this morning, but pt's pain resolved last night about the time he got home from the ER. Go ahead and finish abx as rx'd, proceed with further GI eval as discussed below.  2) Mass -like thickening  of distal sigmoid colon.   Being treated currently as possible focal area of diverticulitis, but need GI eval (colonoscopy) for more definitive diagnosis.  With liver findings, colon ca with met to liver is a distinct possibility.  3) Liver abnormality: CT findings 04/30/18 concerning for infiltrative lesion near hepatic hilum. He has associated elevated AST/ALT/Alk phos/T bili. Abd MRI w and w/out contrast ordered to further evaluate liver abnormality.  An After Visit Summary was printed and given to the patient.  FOLLOW UP: Return in about 2 weeks (around 05/15/2018) for f/u abd/liver.  Signed:  Crissie Sickles, MD           05/01/2018

## 2018-05-02 ENCOUNTER — Telehealth: Payer: Self-pay | Admitting: Gastroenterology

## 2018-05-02 NOTE — Telephone Encounter (Signed)
Left message for patient to return my call to schedule next available APP appointment.

## 2018-05-02 NOTE — Telephone Encounter (Signed)
[facility] like he needs to be seen in the GI clinic MRI has been ordered by family physicians Can you please work him in APPs clinic. Would be happy to do colonoscopy or any procedures

## 2018-05-02 NOTE — Telephone Encounter (Signed)
Please see CT report and referral information.

## 2018-05-02 NOTE — Telephone Encounter (Signed)
Ricardo Washington, please schedule him at next available APP appointment. Looks like JL or J[facility] have a new patient opening next Tuesday. Thank you.

## 2018-05-03 ENCOUNTER — Ambulatory Visit (HOSPITAL_BASED_OUTPATIENT_CLINIC_OR_DEPARTMENT_OTHER)
Admission: RE | Admit: 2018-05-03 | Discharge: 2018-05-03 | Disposition: A | Discharge status: Home / Self Care | Payer: Medicare Other | Source: Ambulatory Visit | Attending: Family Medicine | Admitting: Family Medicine

## 2018-05-03 DIAGNOSIS — R740 Nonspecific elevation of levels of transaminase and lactic acid dehydrogenase [LDH]: Secondary | ICD-10-CM | POA: Insufficient documentation

## 2018-05-03 DIAGNOSIS — R748 Abnormal levels of other serum enzymes: Secondary | ICD-10-CM | POA: Insufficient documentation

## 2018-05-03 DIAGNOSIS — K838 Other specified diseases of biliary tract: Secondary | ICD-10-CM | POA: Insufficient documentation

## 2018-05-03 DIAGNOSIS — D490 Neoplasm of unspecified behavior of digestive system: Secondary | ICD-10-CM | POA: Diagnosis present

## 2018-05-03 DIAGNOSIS — R109 Unspecified abdominal pain: Secondary | ICD-10-CM | POA: Diagnosis not present

## 2018-05-03 DIAGNOSIS — I700 Atherosclerosis of aorta: Secondary | ICD-10-CM | POA: Insufficient documentation

## 2018-05-03 DIAGNOSIS — R7401 Elevation of levels of liver transaminase levels: Secondary | ICD-10-CM

## 2018-05-03 MED ORDER — GADOBENATE DIMEGLUMINE 529 MG/ML IV SOLN
15.0000 mL | Freq: Once | INTRAVENOUS | Status: AC | PRN
  Administered 2018-05-03: 15 mL via INTRAVENOUS

## 2018-05-05 ENCOUNTER — Encounter: Payer: Self-pay | Admitting: Gastroenterology

## 2018-05-05 ENCOUNTER — Other Ambulatory Visit: Payer: Self-pay | Admitting: Family Medicine

## 2018-05-05 NOTE — Telephone Encounter (Signed)
RF request for allopurinol LOV: 05/01/18 Next ov: 05/15/18 Last written: 02/25/17 #30 w/ 11RF  Please advise. Thanks.

## 2018-05-06 ENCOUNTER — Encounter: Payer: Self-pay | Admitting: Family Medicine

## 2018-05-06 ENCOUNTER — Telehealth: Payer: Self-pay | Admitting: Family Medicine

## 2018-05-06 NOTE — Telephone Encounter (Signed)
Discussed results with pt/wife. Possible cholangiocarcinoma. He'll be seeing GI MD in 1 wk.   He'll be seeing me for routine f/u at the end of August this year, earlier as needed.

## 2018-05-13 ENCOUNTER — Encounter: Payer: Self-pay | Admitting: Gastroenterology

## 2018-05-13 ENCOUNTER — Ambulatory Visit (INDEPENDENT_AMBULATORY_CARE_PROVIDER_SITE_OTHER): Payer: Medicare Other | Admitting: Gastroenterology

## 2018-05-13 ENCOUNTER — Other Ambulatory Visit: Payer: Self-pay | Admitting: Family Medicine

## 2018-05-13 VITALS — BP 102/58 | HR 72 | Ht 63.0 in | Wt 174.4 lb

## 2018-05-13 DIAGNOSIS — R7989 Other specified abnormal findings of blood chemistry: Secondary | ICD-10-CM

## 2018-05-13 DIAGNOSIS — R9389 Abnormal findings on diagnostic imaging of other specified body structures: Secondary | ICD-10-CM | POA: Diagnosis not present

## 2018-05-13 DIAGNOSIS — R945 Abnormal results of liver function studies: Secondary | ICD-10-CM | POA: Diagnosis not present

## 2018-05-13 NOTE — Patient Instructions (Addendum)
You have been scheduled for a colonoscopy. Please follow written instructions given to you at your visit today.  Please pick up your prep supplies at the pharmacy within the next 1-3 days. If you use inhalers (even only as needed), please bring them with you on the day of your procedure. Your physician has requested that you go to www.[email or web address].com and enter the access code given to you at your visit today. This web site gives a general overview about your procedure. However, you should still follow specific instructions given to you by our office regarding your preparation for the procedure.  Your provider has requested that you go to the basement level for lab work before leaving today. Press "B" on the elevator. The lab is located at the first door on the left as you exit the elevator.

## 2018-05-13 NOTE — Progress Notes (Signed)
05/13/2018 Ricardo Washington [id number] Mar 28, 1935   HISTORY OF PRESENT ILLNESS:  This is an 83 year old male who is previously known to Dr. Deatra Ina.  His last colonoscopy was in 2010 at which time he had severe diverticulosis and internal hemorrhoids.  He presents to our office today at the request of his PCP, Dr. Anitra Lauth, for evaluation of abnormal imaging.  The patient has some dementia, but is here today with his wife and also his son was on speaker phone for the entire visit.  Apparently what prompted all of this imaging and evaluation was onset of abdominal pain.  He presented to the emergency department earlier this month where CT scan was performed and showed the following:  IMPRESSION: 1. There are findings in the distal sigmoid colon which could be related to acute diverticulitis. However, there is also mass-like thickening of the colon throughout this region. This may simply be reflective of a combination of acute and chronic diverticular disease, however, correlation with nonemergent colonoscopy is recommended after resolution of the patient's acute illness to exclude the possibility of colorectal neoplasm. 2. Focal intrahepatic biliary ductal dilatation most evident in segment 8 of the liver where there is also some regional atrophy. Ill-defined hypovascular area near the hepatic hilum. The possibility of a central neoplasm should be considered, and further evaluation with nonemergent MRI of the abdomen with and without IV gadolinium is strongly recommended in the near future. 3. Aortic atherosclerosis, in addition to 2 vessel coronary artery disease. 4. Additional incidental findings, as above.  He was treated with Augmentin 875 mg twice daily for 10 days for acute diverticulitis, which he just completed.  His pain has resolved.  Due to CT findings MRI abdomen was ordered.  IMPRESSION: 1. Mild motion degradation. 2. Intrahepatic duct dilatation centered in the periphery of  segment 8 with a central area of subtle ductal caught off, probable mild T2 hyperintensity, and delayed post-contrast hypointensity or hypoenhancement. 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 Potential clinical strategies include focused ultrasound to allow possible sampling, PET, or if patient is not a good biopsy or surgical candidate, follow-up pre and post contrast abdominal MRI at 3 months. 3.  Aortic Atherosclerosis (ICD10-I70.0).  Over the past year he has had intermittent elevations of LFT's but they always return to normal.  Was actually evaluated for acute cholecystitis at one point as well but did not have cholecystectomy.  LFT's as follows recently:  AST 384, ALT 143, ALP 127, and total bili 1.6.    Past Medical History:  Diagnosis Date  . BPH with obstruction/lower urinary tract symptoms 10/27/2015   Dr. Karsten Ro  . Cataract   . Colon wall thickening 04/2018   "mass-like" per radiologist interpretation; colonoscopy as next step---GI w/u pending.  . Coronary artery disease    with preserved LV function.  . Dementia   . Diabetes mellitus without complication ([place])   . Diverticulosis of colon    severe, entire colon  . Ectatic abdominal aorta ([place]) 01/2017   Abd u/s: 2.9 cm aortic ectasia--at risk for aneurism development.  Recheck aortic u/s 5 yrs.  . Erectile dysfunction due to arterial insufficiency   . Fatigue   . Gout    always 2nd toe L foot (uric acid 6.20 Dec 2014 per old records)  . Hepatic steatosis   . History of adenomatous polyp of colon 2002  . History of stomach ulcers   . Hyperlipidemia   . Hypertension   . Hypogonadism male   .  Klebsiella sepsis ([place]) 01/2017   due to acute biliary tract infection (no stones) and acute diverticulitis.  . Liver mass 04/2018   GI ref: MRI abd-->?early cholangiocarcinoma-->tissue dx vs PET, vs repeat MRI 3 mo.  . Macrocytic anemia 01/2017   vit B12 borderline low and iron borderline low: checking hemoccults  and starting vit B12 PO and iron PO as of 02/11/17.  . Myogenic ptosis of bilateral eyelids 2018   Plastic surgery in [place], [place] to do surg as of 03/2017.  Marland Kitchen NASH (nonalcoholic steatohepatitis) 06/2017   LFTs up, abd u/s showed fatty liver but no other abnormality.  . Past use of tobacco    quit 1984  . Rectal bleeding   . Rosacea   . S/P coronary artery bypass graft x 3   . Urine incontinence    Dr. Karsten Ro   Past Surgical History:  Procedure Laterality Date  . CARDIOVASCULAR STRESS TEST  08/28/2016   Low risk myoview, normal EF, no ischemia.  Marland Kitchen CATARACT EXTRACTION, BILATERAL    . COLONOSCOPY  2002, 2006, 02/25/2009   Polyp x 1 2002, none 2006 or 2010  . CORONARY ARTERY BYPASS GRAFT  06/2009   descending,saphenous vein graft to first obtuse marginal, sequential saphenous vein graft to posterior descending and posterolateral  . Endoscopic vein harvest right thigh    . PTCA    . TONSILLECTOMY    . UMBILICAL HERNIA REPAIR     2009  . VASECTOMY      reports that he quit smoking about 35 years ago. His smoking use included cigarettes. He has a 30.00 pack-year smoking history. He has never used smokeless tobacco. He reports that he drinks about 3.0 - 4.2 oz of alcohol per week. He reports that he does not use drugs. family history includes Cancer in his father and mother. Allergies  Allergen Reactions  . Sulfonamide Derivatives     Unknown, per pt and "cant stand it"      Outpatient Encounter Medications as of 05/13/2018  Medication Sig  . allopurinol (ZYLOPRIM) 100 MG tablet TAKE ONE TABLET BY MOUTH ON MONDAY, WEDNESDAY, AND FRIDAY  . allopurinol (ZYLOPRIM) 100 MG tablet TAKE ONE TABLET BY MOUTH ON MONDAY, WEDNESDAY, AND FRIDAY  . ALPRAZolam (NIRAVAM) 0.25 MG dissolvable tablet Take 0.25 mg by mouth at bedtime as needed for anxiety.  Marland Kitchen aspirin 325 MG tablet Take 325 mg by mouth daily.    . ferrous sulfate 325 (65 FE) MG EC tablet Take 325 mg by mouth daily with breakfast.  .  finasteride (PROSCAR) 5 MG tablet TAKE ONE TABLET BY MOUTH DAILY  . Lancets (ONETOUCH ULTRASOFT) lancets Use to check blood sugar once daily  . lisinopril (PRINIVIL,ZESTRIL) 5 MG tablet TAKE ONE TABLET BY MOUTH DAILY  . memantine (NAMENDA) 10 MG tablet Take 1 tablet (10 mg total) 2 (two) times daily by mouth.  . metFORMIN (GLUCOPHAGE-XR) 500 MG 24 hr tablet TAKE ONE TABLET BY MOUTH TWICE DAILY WITH MEALS  . metoprolol tartrate (LOPRESSOR) 25 MG tablet TAKE ONE TABLET BY MOUTH TWICE DAILY  . MULTIPLE VITAMIN PO Take 1 tablet by mouth daily.   . rosuvastatin (CRESTOR) 10 MG tablet TAKE ONE TABLET BY MOUTH DAILY  . tamsulosin (FLOMAX) 0.4 MG CAPS capsule TAKE ONE CAPSULE BY MOUTH DAILY  . Trospium Chloride 60 MG CP24 TAKE ONE CAPSULE BY MOUTH DAILY  . vitamin B-12 (CYANOCOBALAMIN) 1000 MCG tablet Take 1,000 mcg by mouth daily.  . nitroGLYCERIN (NITROSTAT) 0.4 MG SL tablet Place  1 tablet (0.4 mg total) under the tongue every 5 (five) minutes as needed for chest pain.  . [DISCONTINUED] betamethasone dipropionate (DIPROLENE) 0.05 % cream Apply to affected areas twice daily AS NEEDED for itchy RASH  . [DISCONTINUED] ondansetron (ZOFRAN ODT) 4 MG disintegrating tablet Take 1 tablet (4 mg total) by mouth every 8 (eight) hours as needed for nausea or vomiting.  . [DISCONTINUED] traMADol (ULTRAM) 50 MG tablet Take 1 tablet (50 mg total) by mouth every 6 (six) hours as needed for severe pain.   No facility-administered encounter medications on file as of 05/13/2018.      REVIEW OF SYSTEMS  : All other systems reviewed and negative except where noted in the History of Present Illness.   PHYSICAL EXAM: BP (!) 102/58   Pulse 72   Ht 5' 3"  (1.6 m)   Wt 174 lb 6.4 oz (79.1 kg)   BMI 30.89 kg/m  General: Well developed white male in no acute distress Head: Normocephalic and atraumatic Eyes:  Sclerae anicteric, conjunctiva pink. Ears: Normal auditory acuity Lungs: Clear throughout to auscultation; no  increased WOB. Heart: Regular rate and rhythm; no M/R/G. Abdomen: Soft, non-distended.  BS present.  Non-tender. Rectal:  Will be done at the time of colonoscopy. Musculoskeletal: Symmetrical with no gross deformities  Skin: No lesions on visible extremities Extremities: No edema  Neurological: Alert oriented x 4, grossly non-focal Psychological:  Alert and cooperative. Normal mood and affect  ASSESSMENT AND PLAN: *Abnormal CT scan showing ? Malignancy vs acute on chronic diverticular disease changes.  Will schedule for colonoscopy with Dr. [name].  Was treated for diverticulitis and was having acute pain in that area that resolved with antibiotics so will push colonoscopy out about 4 weeks or so. *Abnormal CT scan and MRI showing intrahepatic biliary dilation with cut-off suspicious for early cholangiocarcinoma.  LFT's elevated on and off as well, previously evaluated for acute cholecystitis but did not have cholecystectomy.  Discussed with Dr. Silverio Decamp, do not think we can reach this with ERCP.  ? If Dr. Ardis Hughs can do EUS to access vs IR biopsy vs PET scan vs repeat MRI in 3 months.  Discussed all of this with that patient and is wife and their son via phone (was on speaker phone for the entire visit).  Will repeat LFT's today.  **The risks, benefits, and alternatives to colonoscopy were discussed with the patient and he consents to proceed.  **Patient being assigned to Dr. [name], but Dr. Ardis Hughs will you please review for possible EUS.  CC:  McGowen, Adrian Blackwater, MD

## 2018-05-14 ENCOUNTER — Other Ambulatory Visit (INDEPENDENT_AMBULATORY_CARE_PROVIDER_SITE_OTHER): Payer: Medicare Other

## 2018-05-14 ENCOUNTER — Encounter: Payer: Self-pay | Admitting: Gastroenterology

## 2018-05-14 DIAGNOSIS — R945 Abnormal results of liver function studies: Secondary | ICD-10-CM | POA: Diagnosis not present

## 2018-05-14 DIAGNOSIS — R7989 Other specified abnormal findings of blood chemistry: Secondary | ICD-10-CM | POA: Insufficient documentation

## 2018-05-14 DIAGNOSIS — R9389 Abnormal findings on diagnostic imaging of other specified body structures: Secondary | ICD-10-CM | POA: Insufficient documentation

## 2018-05-14 LAB — HEPATIC FUNCTION PANEL
ALBUMIN: 4.1 g/dL (ref 3.5–5.2)
ALT: 30 U/L (ref 0–53)
AST: 15 U/L (ref 0–37)
Alkaline Phosphatase: 93 U/L (ref 39–117)
BILIRUBIN TOTAL: 0.5 mg/dL (ref 0.2–1.2)
Bilirubin, Direct: 0.2 mg/dL (ref 0.0–0.3)
Total Protein: 6.5 g/dL (ref 6.0–8.3)

## 2018-05-14 NOTE — Telephone Encounter (Signed)
RF request for namenda LOV: 05/01/18 Next ov: 07/10/18 Last written: 01/13/18 #60 w/ 11RF  Please advise. Thanks.

## 2018-05-14 NOTE — Telephone Encounter (Signed)
Note from pharmacy stating that pt is requesting 90 day supply.   Rx sent for #180 w/ 1RF.

## 2018-05-15 ENCOUNTER — Telehealth: Payer: Self-pay

## 2018-05-15 ENCOUNTER — Ambulatory Visit: Payer: Medicare Other | Admitting: Family Medicine

## 2018-05-15 DIAGNOSIS — R933 Abnormal findings on diagnostic imaging of other parts of digestive tract: Secondary | ICD-10-CM

## 2018-05-15 NOTE — Telephone Encounter (Signed)
-----   Message from Loralie Champagne, PA-C sent at 05/15/2018  1:40 PM EDT -----   ----- Message ----- From: Jackquline Denmark, MD Sent: 05/15/2018  10:46 AM To: Loralie Champagne, PA-C  Lets check CA 19-9, CEA level.  Repeat liver function tests, get PT INR. Set him up for a PET scan Can you please run it by IR -to determine if they can put a needle in for a biopsy.  I am on vacation starting tomorrow and will not be back until  July 8.  Very interesting. Thanks for your help Merrie Roof  ----- Message ----- From: Loralie Champagne, PA-C Sent: 05/15/2018  10:41 AM To: Jackquline Denmark, MD  Dr. [name],  Per Dr. Ardis Hughs, EUS will not reach this area for biopsy/diagnosis.  Should I send to IR?  PET scan?  Repeat MRI in 3 months?  Thank you,  Jess   ----- Message ----- From: Milus Banister, MD Sent: 05/15/2018   9:12 AM To: Loralie Champagne, PA-C    ----- Message ----- From: Loralie Champagne, PA-C Sent: 05/14/2018   1:32 PM To: Milus Banister, MD

## 2018-05-15 NOTE — Telephone Encounter (Signed)
PET scan at [facility] radiology on 05/19/18 at 3 pm arrive at 230 pm NPO 6 hours. The pt will come in on 6/28 for labs.  He has been instructed and verbalized understanding

## 2018-05-15 NOTE — Progress Notes (Signed)
Please contact the patient and his wife and let them know that Dr. Ardis Hughs does not think that he can access that area with EUS.  For now we need him to have some more labs drawn, CA 19-9 and CEA, which are tumor markers.  Also get a PT/INR.  And schedule for a PET scan.  I am going to try to get in touch with interventional radiology to see if they think that they can access that area for biopsy.  Thank you,  Jess

## 2018-05-15 NOTE — Progress Notes (Signed)
I reviewed CT and MR. This is not an area that can be evaluated by EUS, it is too deep in the liver.

## 2018-05-15 NOTE — Telephone Encounter (Signed)
Author: Loralie Champagne, PA-C Service: Gastroenterology Author Type: Physician Assistant  Filed: 05/15/2018 1:40 PM Encounter Date: 05/13/2018 Status: Signed  Editor: Zehr, Laban Emperor, PA-C (Physician Assistant)       Show:Clear all [x] Manual[] Template[x] Copied  Added by: [x] Zehr, Laban Emperor, PA-C   [] Hover for details   Please contact the patient and his wife and let them know that Dr. Ardis Hughs does not think that he can access that area with EUS.  For now we need him to have some more labs drawn, CA 19-9 and CEA, which are tumor markers.  Also get a PT/INR.  And schedule for a PET scan.  I am going to try to get in touch with interventional radiology to see if they think that they can access that area for biopsy.  Thank you,  Jess

## 2018-05-16 ENCOUNTER — Telehealth: Payer: Self-pay

## 2018-05-16 ENCOUNTER — Other Ambulatory Visit (INDEPENDENT_AMBULATORY_CARE_PROVIDER_SITE_OTHER): Payer: Medicare Other

## 2018-05-16 DIAGNOSIS — R933 Abnormal findings on diagnostic imaging of other parts of digestive tract: Secondary | ICD-10-CM

## 2018-05-16 DIAGNOSIS — K838 Other specified diseases of biliary tract: Secondary | ICD-10-CM | POA: Diagnosis not present

## 2018-05-16 DIAGNOSIS — R932 Abnormal findings on diagnostic imaging of liver and biliary tract: Secondary | ICD-10-CM | POA: Diagnosis not present

## 2018-05-16 LAB — PROTIME-INR
INR: 1 ratio (ref 0.8–1.0)
Prothrombin Time: 11.9 s (ref 9.6–13.1)

## 2018-05-16 NOTE — Telephone Encounter (Signed)
The pt has been advised of the information and verbalized understanding.     You have been scheduled for an abdominal ultrasound at [facility] (1st floor of hospital) on 05/20/18 at 1115 am. Please arrive 15 minutes prior to your appointment for registration. Make certain not to have anything to eat or drink 6 hours prior to your appointment. Should you need to reschedule your appointment, please contact radiology at [phone]. This test typically takes about 30 minutes to perform.

## 2018-05-16 NOTE — Telephone Encounter (Signed)
-----   Message from Loralie Champagne, PA-C sent at 05/16/2018 12:17 PM EDT ----- Ricardo Washington,  Please let the patient know that I discussed with IR and they are recommending a RUQ abdominal ultrasound to see if they can even identify and target the area with a biopsy as it is extremely difficult to see.  Please schedule in addition to his PET scan if he is willing.  Dr. [name],  Just FYI above.  I am doing all of this but could we please get him on the schedule to see you back in the office soon to discuss if we even want to go about biopsy (if even possible) or repeat MRI in 3-6 months with observation due to his age, dementia, etc.  Do not think he is a liver surgery candidate so is tissue diagnosis even going to make a difference?  Thank you,  Jes

## 2018-05-19 ENCOUNTER — Ambulatory Visit (HOSPITAL_COMMUNITY)
Admission: RE | Admit: 2018-05-19 | Discharge: 2018-05-19 | Disposition: A | Discharge status: Home / Self Care | Payer: Medicare Other | Source: Ambulatory Visit | Attending: Gastroenterology | Admitting: Gastroenterology

## 2018-05-19 DIAGNOSIS — R933 Abnormal findings on diagnostic imaging of other parts of digestive tract: Secondary | ICD-10-CM | POA: Diagnosis not present

## 2018-05-19 DIAGNOSIS — K769 Liver disease, unspecified: Secondary | ICD-10-CM | POA: Diagnosis not present

## 2018-05-19 LAB — CEA: CEA: 2.1 ng/mL

## 2018-05-19 LAB — CANCER ANTIGEN 19-9: CA 19-9: 19 U/mL (ref <34)

## 2018-05-19 LAB — GLUCOSE, CAPILLARY: GLUCOSE-CAPILLARY: 89 mg/dL (ref 70–99)

## 2018-05-19 MED ORDER — FLUDEOXYGLUCOSE F - 18 (FDG) INJECTION
8.7000 | Freq: Once | INTRAVENOUS | Status: AC | PRN
  Administered 2018-05-19: 8.7 via INTRAVENOUS

## 2018-05-20 ENCOUNTER — Ambulatory Visit (HOSPITAL_COMMUNITY)
Admission: RE | Admit: 2018-05-20 | Discharge: 2018-05-20 | Disposition: A | Discharge status: Home / Self Care | Payer: Medicare Other | Source: Ambulatory Visit | Attending: Gastroenterology | Admitting: Gastroenterology

## 2018-05-20 ENCOUNTER — Other Ambulatory Visit: Payer: Self-pay | Admitting: Gastroenterology

## 2018-05-20 DIAGNOSIS — R933 Abnormal findings on diagnostic imaging of other parts of digestive tract: Secondary | ICD-10-CM | POA: Diagnosis not present

## 2018-05-20 DIAGNOSIS — K838 Other specified diseases of biliary tract: Secondary | ICD-10-CM | POA: Diagnosis not present

## 2018-05-26 ENCOUNTER — Other Ambulatory Visit: Payer: Self-pay | Admitting: Family Medicine

## 2018-06-02 ENCOUNTER — Encounter: Payer: Self-pay | Admitting: Family Medicine

## 2018-06-10 ENCOUNTER — Other Ambulatory Visit: Payer: Self-pay | Admitting: Family Medicine

## 2018-06-11 ENCOUNTER — Telehealth: Payer: Self-pay

## 2018-06-11 ENCOUNTER — Encounter: Payer: Self-pay | Admitting: Gastroenterology

## 2018-06-11 ENCOUNTER — Ambulatory Visit (AMBULATORY_SURGERY_CENTER): Payer: Medicare Other | Admitting: Gastroenterology

## 2018-06-11 ENCOUNTER — Telehealth: Payer: Self-pay | Admitting: *Deleted

## 2018-06-11 ENCOUNTER — Other Ambulatory Visit: Payer: Self-pay

## 2018-06-11 VITALS — BP 128/75 | HR 57 | Temp 98.6°F | Resp 18 | Ht 63.0 in | Wt 174.0 lb

## 2018-06-11 DIAGNOSIS — R9389 Abnormal findings on diagnostic imaging of other specified body structures: Secondary | ICD-10-CM

## 2018-06-11 DIAGNOSIS — Z951 Presence of aortocoronary bypass graft: Secondary | ICD-10-CM | POA: Diagnosis not present

## 2018-06-11 DIAGNOSIS — D124 Benign neoplasm of descending colon: Secondary | ICD-10-CM | POA: Diagnosis not present

## 2018-06-11 DIAGNOSIS — I2510 Atherosclerotic heart disease of native coronary artery without angina pectoris: Secondary | ICD-10-CM | POA: Diagnosis not present

## 2018-06-11 DIAGNOSIS — R933 Abnormal findings on diagnostic imaging of other parts of digestive tract: Secondary | ICD-10-CM | POA: Diagnosis not present

## 2018-06-11 DIAGNOSIS — I10 Essential (primary) hypertension: Secondary | ICD-10-CM | POA: Diagnosis not present

## 2018-06-11 MED ORDER — SODIUM CHLORIDE 0.9 % IV SOLN
500.0000 mL | Freq: Once | INTRAVENOUS | Status: DC
Start: 2018-06-11 — End: 2018-06-11

## 2018-06-11 NOTE — Progress Notes (Signed)
Report given to PACU, vss 

## 2018-06-11 NOTE — Op Note (Addendum)
[facility] Patient Name: Ricardo Washington Procedure Date: 06/11/2018 1:33 PM MRN: [id number] Endoscopist: Jackquline Denmark , MD Age: 83 Referring MD:  Date of Birth: [birth year] Gender: Male Account #: [id number] Procedure:                Colonoscopy Indications:              Abnormal CT of the GI tract Medicines:                Monitored Anesthesia Care Procedure:                Pre-Anesthesia Assessment:                           - Prior to the procedure, a History and Physical                            was performed, and patient medications and                            allergies were reviewed. The patient is competent.                            The risks and benefits of the procedure and the                            sedation options and risks were discussed with the                            patient. All questions were answered and informed                            consent was obtained. Patient identification and                            proposed procedure were verified by the physician                            in the procedure room. Mental Status Examination:                            alert and oriented. Prophylactic Antibiotics: The                            patient does not require prophylactic antibiotics.                            Prior Anticoagulants: The patient has taken no                            previous anticoagulant or antiplatelet agents. ASA                            Grade Assessment: III - A patient with severe  systemic disease. After reviewing the risks and                            benefits, the patient was deemed in satisfactory                            condition to undergo the procedure. The anesthesia                            plan was to use monitored anesthesia care (MAC).                            Immediately prior to administration of medications,                            the patient was re-assessed  for adequacy to receive                            sedatives. The heart rate, respiratory rate, oxygen                            saturations, blood pressure, adequacy of pulmonary                            ventilation, and response to care were monitored                            throughout the procedure. The physical status of                            the patient was re-assessed after the procedure.                           After obtaining informed consent, the colonoscope                            was passed under direct vision. Throughout the                            procedure, the patient's blood pressure, pulse, and                            oxygen saturations were monitored continuously. The                            Colonoscope was introduced through the anus and                            advanced to the 2 cm into the ileum. The                            colonoscopy was performed without difficulty. The  patient tolerated the procedure well. The quality                            of the bowel preparation was adequate to identify                            polyps 6 mm and larger in size. Scope In: 1:37:00 PM Scope Out: 1:58:00 PM Scope Withdrawal Time: 0 hours 15 minutes 14 seconds  Total Procedure Duration: 0 hours 21 minutes 0 seconds  Findings:                 A 4 mm polyp was found in the descending colon. The                            polyp was sessile. The polyp was removed with a                            cold snare. Resection and retrieval were complete.                            Estimated blood loss: none.                           Moderate diverticulosis in the entire examined                            colon, more prominently in the descending colon and                            in the sigmoid colon.There are a few diverticula                            also in the cecum. There was no evidence of                             diverticular bleeding. Few diverticula had stool                            impacted.                           Mild telangiectasia in the sigmoid colon without                            any masses. ? Importance. Photodocumentation was                            obtained.                           Non-bleeding internal hemorrhoids were found during                            retroflexion. The hemorrhoids were small. No  history of radiation. Complications:            No immediate complications. Estimated Blood Loss:     Estimated blood loss: none. Impression:               - Colonic polyp status post polypectomy.                           - Moderate pancolonic diverticulosis                           - Non-bleeding internal hemorrhoids. Recommendation:           - Patient has a contact number available for                            emergencies. The signs and symptoms of potential                            delayed complications were discussed with the                            patient. Return to normal activities tomorrow.                            Written discharge instructions were provided to the                            patient.                           - High fiber diet.                           - Continue present medications.                           - Await pathology results.                           - Return to GI clinic in 4-6 weeks with me or App                            clinic. Discussed with patient's wife.                           ADDENDUM: PATIENT WOULD BE SCHEDULED FOR IR                            CONSULTATION TODAY FOR CONSIDERATION OF BIOPSY OF                            "? PORTA HEPATIS MASS". FOLLOW-UP IN THE GI CLINIC                            THEREAFTER. Jackquline Denmark, MD 06/11/2018 2:08:01 PM This report has been signed electronically.

## 2018-06-11 NOTE — Progress Notes (Signed)
Dr. [name] ordered a IR consult in recovery for the patient.  I called Pam his RN, and she will schedule it and call the patient. His wife is aware of the liver biopsy.

## 2018-06-11 NOTE — Patient Instructions (Signed)
YOU HAD AN ENDOSCOPIC PROCEDURE TODAY AT [facility]:   Refer to the procedure report that was given to you for any specific questions about what was found during the examination.  If the procedure report does not answer your questions, please call your gastroenterologist to clarify.  If you requested that your care partner not be given the details of your procedure findings, then the procedure report has been included in a sealed envelope for you to review at your convenience later.  YOU SHOULD EXPECT: Some feelings of bloating in the abdomen. Passage of more gas than usual.  Walking can help get rid of the air that was put into your GI tract during the procedure and reduce the bloating. If you had a lower endoscopy (such as a colonoscopy or flexible sigmoidoscopy) you may notice spotting of blood in your stool or on the toilet paper. If you underwent a bowel prep for your procedure, you may not have a normal bowel movement for a few days.  Please Note:  You might notice some irritation and congestion in your nose or some drainage.  This is from the oxygen used during your procedure.  There is no need for concern and it should clear up in a day or so.  SYMPTOMS TO REPORT IMMEDIATELY:   Following lower endoscopy (colonoscopy or flexible sigmoidoscopy):  Excessive amounts of blood in the stool  Significant tenderness or worsening of abdominal pains  Swelling of the abdomen that is new, acute  Fever of 100F or higher   For urgent or emergent issues, a gastroenterologist can be reached at any hour by calling [phone].   DIET:  We do recommend a small meal at first, but then you may proceed to your regular diet.  Drink plenty of fluids but you should avoid alcoholic beverages for 24 hours. Eat a high fiber diet, and drink plenty of water.  ACTIVITY:  You should plan to take it easy for the rest of today and you should NOT DRIVE or use heavy machinery until tomorrow (because  of the sedation medicines used during the test).    FOLLOW UP: Our staff will call the number listed on your records the next business day following your procedure to check on you and address any questions or concerns that you may have regarding the information given to you following your procedure. If we do not reach you, we will leave a message.  However, if you are feeling well and you are not experiencing any problems, there is no need to return our call.  We will assume that you have returned to your regular daily activities without incident.  If any biopsies were taken you will be contacted by phone or by letter within the next 1-3 weeks.  Please call us at [phone] if you have not heard about the biopsies in 3 weeks.    SIGNATURES/CONFIDENTIALITY: You and/or your care partner have signed paperwork which will be entered into your electronic medical record.  These signatures attest to the fact that that the information above on your After Visit Summary has been reviewed and is understood.  Full responsibility of the confidentiality of this discharge information lies with you and/or your care-partner.  Read all of the handouts given to you by your recovery room nurse.

## 2018-06-11 NOTE — Telephone Encounter (Signed)
On the order of Dr [name], I ordered a referral to IR for evaluation and management, abnormal CT suspicious for cholangiocarcinoma.

## 2018-06-11 NOTE — Progress Notes (Signed)
Called to room to assist during endoscopic procedure.  Patient ID and intended procedure confirmed with present staff. Received instructions for my participation in the procedure from the performing physician.  

## 2018-06-11 NOTE — Progress Notes (Signed)
Pt's states no medical or surgical changes since previsit or office visit. 

## 2018-06-11 NOTE — Telephone Encounter (Signed)
Lmom hm and cell to sch consult in IR for abnormal CT suspicious for cholangiocarcinoma./vm

## 2018-06-12 ENCOUNTER — Telehealth: Payer: Self-pay | Admitting: *Deleted

## 2018-06-12 ENCOUNTER — Ambulatory Visit
Admission: RE | Admit: 2018-06-12 | Discharge: 2018-06-12 | Disposition: A | Discharge status: Home / Self Care | Payer: Medicare Other | Source: Ambulatory Visit | Attending: Gastroenterology | Admitting: Gastroenterology

## 2018-06-12 ENCOUNTER — Encounter: Payer: Self-pay | Admitting: Radiology

## 2018-06-12 DIAGNOSIS — K7689 Other specified diseases of liver: Secondary | ICD-10-CM | POA: Diagnosis not present

## 2018-06-12 DIAGNOSIS — R9389 Abnormal findings on diagnostic imaging of other specified body structures: Secondary | ICD-10-CM

## 2018-06-12 HISTORY — PX: IR RADIOLOGIST EVAL & MGMT: IMG5224

## 2018-06-12 NOTE — Telephone Encounter (Signed)
  Follow up Call-  Call back number 06/11/2018  Post procedure Call Back phone  # [phone]  Permission to leave phone message Yes  Some recent data might be hidden     Patient questions:  Do you have a fever, pain , or abdominal swelling? No. Pain Score  0 *  Have you tolerated food without any problems? Yes.    Have you been able to return to your normal activities? Yes.    Do you have any questions about your discharge instructions: Diet   No. Medications  No. Follow up visit  No.  Do you have questions or concerns about your Care? No.  Actions: * If pain score is 4 or above: No action needed, pain <4.

## 2018-06-12 NOTE — Consult Note (Signed)
Chief Complaint:  Suspicious central liver indeterminate mass with associated biliary dilatation  Referring Physician(s): Gupta,Rajesh  History of Present Illness: Ricardo Washington is a 83 y.o. male with a prior history of severe diverticulosis and hemorrhoids.  He is followed closely by GI.  CT imaging demonstrates a slowly developing central liver hilar lesion which is hypovascular.  There is development of focal intrahepatic biliary dilatation extending into segment 8.  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  He subsequently underwent MRI and PET imaging which also was consistent with this finding.  No significant extrahepatic disease.  He also had a right upper quadrant ultrasound which failed to demonstrate the lesion by ultrasound.  He is here today to assess the lesion and to discuss biopsy.  He has been evaluated by GI and the lesion would not be amenable to EUS biopsy.  Past Medical History:  Diagnosis Date  . BPH with obstruction/lower urinary tract symptoms 10/27/2015   Dr. Karsten Ro  . Cataract   . Colon wall thickening 04/2018   "mass-like" per radiologist interpretation; colonoscopy as next step---GI plans on colonoscopy towards end of July 2019.  Marland Kitchen Coronary artery disease    with preserved LV function.  . Dementia   . Diabetes mellitus without complication ([place])   . Diverticulosis of colon    severe, entire colon  . Ectatic abdominal aorta ([place]) 01/2017   Abd u/s: 2.9 cm aortic ectasia--at risk for aneurism development.  Recheck aortic u/s 5 yrs.  . Erectile dysfunction due to arterial insufficiency   . Fatigue   . Gout    always 2nd toe L foot (uric acid 6.20 Dec 2014 per old records)  . Hepatic steatosis   . History of adenomatous polyp of colon 2002  . History of stomach ulcers   . Hyperlipidemia   . Hypertension   . Hypogonadism male   . Klebsiella sepsis ([place]) 01/2017   due to acute biliary tract infection (no stones) and acute  diverticulitis.  . Liver mass 04/2018   GI ref: MRI abd-->?early cholangiocarcinoma-->tissue dx vs PET, vs repeat MRI 3 mo.  GI recommended PET-->results suggestive of cholangiocarcinoma.  Also, GI is seeing if IR can reach this region for bx (can't reach by EUS per GI).  . Macrocytic anemia 01/2017   vit B12 borderline low and iron borderline low: checking hemoccults and starting vit B12 PO and iron PO as of 02/11/17.  . Myogenic ptosis of bilateral eyelids 2018   Plastic surgery in [place], [place] to do surg as of 03/2017.  Marland Kitchen NASH (nonalcoholic steatohepatitis) 06/2017   LFTs up, abd u/s showed fatty liver but no other abnormality.  . Past use of tobacco    quit 1984  . Rectal bleeding   . Rosacea   . S/P coronary artery bypass graft x 3   . Urine incontinence    Dr. Karsten Ro    Past Surgical History:  Procedure Laterality Date  . CARDIOVASCULAR STRESS TEST  08/28/2016   Low risk myoview, normal EF, no ischemia.  Marland Kitchen CATARACT EXTRACTION, BILATERAL    . COLONOSCOPY  2002, 2006, 02/25/2009   Polyp x 1 2002, none 2006 or 2010  . CORONARY ARTERY BYPASS GRAFT  06/2009   descending,saphenous vein graft to first obtuse marginal, sequential saphenous vein graft to posterior descending and posterolateral  . Endoscopic vein harvest right thigh    . IR RADIOLOGIST EVAL & MGMT  06/12/2018  . PTCA    .  TONSILLECTOMY    . UMBILICAL HERNIA REPAIR     2009  . VASECTOMY      Allergies: Sulfonamide derivatives  Medications: Prior to Admission medications   Medication Sig Start Date End Date Taking? Authorizing Provider  allopurinol (ZYLOPRIM) 100 MG tablet TAKE ONE TABLET BY MOUTH ON MONDAY, WEDNESDAY, AND FRIDAY 05/06/18  Yes McGowen, Adrian Blackwater, MD  ALPRAZolam (NIRAVAM) 0.25 MG dissolvable tablet Take 0.25 mg by mouth at bedtime as needed for anxiety.   Yes [provider]  aspirin 325 MG tablet Take 325 mg by mouth daily.     Yes [provider]  ferrous sulfate 325 (65 FE) MG EC  tablet Take 325 mg by mouth daily with breakfast.   Yes [provider]  finasteride (PROSCAR) 5 MG tablet TAKE ONE TABLET BY MOUTH DAILY 05/26/18  Yes Kuneff, Renee A, DO  Lancets (ONETOUCH ULTRASOFT) lancets Use to check blood sugar once daily 04/15/18  Yes McGowen, Adrian Blackwater, MD  lisinopril (PRINIVIL,ZESTRIL) 5 MG tablet TAKE ONE TABLET BY MOUTH DAILY 01/13/18  Yes McGowen, Adrian Blackwater, MD  memantine (NAMENDA) 10 MG tablet Take 1 tablet (10 mg total) 2 (two) times daily by mouth. 05/14/18  Yes McGowen, Adrian Blackwater, MD  metFORMIN (GLUCOPHAGE-XR) 500 MG 24 hr tablet TAKE ONE TABLET BY MOUTH TWICE DAILY WITH MEALS 12/06/17  Yes McGowen, Adrian Blackwater, MD  metoprolol tartrate (LOPRESSOR) 25 MG tablet TAKE ONE TABLET BY MOUTH TWICE DAILY 05/26/18  Yes Kuneff, Renee A, DO  MULTIPLE VITAMIN PO Take 1 tablet by mouth daily.    Yes [provider]  nitroGLYCERIN (NITROSTAT) 0.4 MG SL tablet Place 1 tablet (0.4 mg total) under the tongue every 5 (five) minutes as needed for chest pain. 08/20/16 06/12/18 Yes Josue Hector, MD  rosuvastatin (CRESTOR) 10 MG tablet TAKE ONE TABLET BY MOUTH DAILY 05/05/18  Yes McGowen, Adrian Blackwater, MD  tamsulosin (FLOMAX) 0.4 MG CAPS capsule TAKE ONE CAPSULE BY MOUTH DAILY 02/11/18  Yes McGowen, Adrian Blackwater, MD  Trospium Chloride 60 MG CP24 TAKE ONE CAPSULE BY MOUTH DAILY 06/10/18  Yes McGowen, Adrian Blackwater, MD  vitamin B-12 (CYANOCOBALAMIN) 1000 MCG tablet Take 1,000 mcg by mouth daily.   Yes [provider]  allopurinol (ZYLOPRIM) 100 MG tablet TAKE ONE TABLET BY MOUTH ON MONDAY, [place], AND FRIDAY 02/11/18   [provider]     Family History  Problem Relation Age of Onset  . Cancer Mother   . Cancer Father        pt points to LLQ as  area of cancer, so potentially could have been intestinal.     . Colon cancer Neg Hx   . Esophageal cancer Neg Hx   . Stomach cancer Neg Hx   . Rectal cancer Neg Hx     Social History   Socioeconomic History  . Marital status:  Married    Spouse name: Not on file  . Number of children: 3  . Years of education: 52  . Highest education level: Not on file  Occupational History  . Not on file  Social Needs  . Financial resource strain: Not on file  . Food insecurity:    Worry: Not on file    Inability: Not on file  . Transportation needs:    Medical: Not on file    Non-medical: Not on file  Tobacco Use  . Smoking status: Former Smoker    Packs/day: 1.00    Years: 30.00  Pack years: 30.00    Types: Cigarettes    Last attempt to quit: 03/15/1983    Years since quitting: 35.2  . Smokeless tobacco: Never Used  . Tobacco comment: Quit 1984  Substance and Sexual Activity  . Alcohol use: Yes    Alcohol/week: 3.0 - 4.2 oz    Types: 5 - 7 Glasses of wine per week    Comment: quit etoh 1999 but in ~ 2017 began having a glass of wine with dinner   . Drug use: No  . Sexual activity: Never  Lifestyle  . Physical activity:    Days per week: Not on file    Minutes per session: Not on file  . Stress: Not on file  Relationships  . Social connections:    Talks on phone: Not on file    Gets together: Not on file    Attends religious service: Not on file    Active member of club or organization: Not on file    Attends meetings of clubs or organizations: Not on file    Relationship status: Not on file  Other Topics Concern  . Not on file  Social History Narrative   Married 1957, has 3 sons.   [place]. 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    End of life Care: DNR, no prolonged heroic measures or prolonged supportive care.    Former smoker, quit 1984.   Quit alcohol when dx'd with DM in 2009. ~ 2017 began having glass of wine with dinner.   No exercise.    ECOG Status: 1 - Symptomatic but completely ambulatory  Review of Systems: A 12 point ROS discussed and pertinent positives are indicated in the HPI above.  All other systems are  negative.  Review of Systems  Vital Signs: BP 134/63   Pulse (!) 58   Temp 97.6 F (36.4 C) (Oral)   Resp 15   Ht 5' 3"  (1.6 m)   Wt 174 lb (78.9 kg)   SpO2 97%   BMI 30.82 kg/m   Physical Exam  Constitutional: He is oriented to person, place, and time. He appears well-developed and well-nourished. No distress.  Eyes: Conjunctivae are normal. No scleral icterus.  Cardiovascular: Normal rate and regular rhythm.  Pulmonary/Chest: Effort normal and breath sounds normal.  Abdominal: Soft. Bowel sounds are normal.  Musculoskeletal: Normal range of motion. He exhibits no edema.  Neurological: He is alert and oriented to person, place, and time.  Skin: He is not diaphoretic.  Psychiatric: He has a normal mood and affect.     Imaging: Nm Pet Image Initial (pi) Skull Base To Thigh  Result Date: 05/19/2018 CLINICAL DATA:  Initial treatment strategy for right hepatic lobe obstructing lesion. EXAM: NUCLEAR MEDICINE PET SKULL BASE TO THIGH TECHNIQUE: 8.7 mCi F-18 FDG was injected intravenously. Full-ring PET imaging was performed from the skull base to thigh after the radiotracer. CT data was obtained and used for attenuation correction and anatomic localization. Fasting blood glucose: 89 mg/dl COMPARISON:  MRI 05/03/2018 FINDINGS: Mediastinal blood pool activity: SUV max 2.39 NECK: No hypermetabolic lymph nodes in the neck. Incidental CT findings: none CHEST: No hypermetabolic mediastinal or hilar nodes. No suspicious pulmonary nodules on the CT scan. Incidental CT findings: Previous median sternotomy and CABG procedure. Aortic atherosclerosis noted. ABDOMEN/PELVIS: Corresponding to the MR abnormality is a focal area of increased uptake within the central aspect of segment 8. This has an SUV max of  5.73 and measures approximately 2.6 cm. Associated dilatation of the bile ducts within segment 8 are again noted. (Background liver activity has an SUV max of 3.76.) No abnormal radiotracer activity  identified within the pancreas, spleen, or adrenal glands. No hypermetabolic lymph nodes within the abdomen or pelvis. Incidental CT findings: Aortic atherosclerosis.  No aneurysm. SKELETON: No focal hypermetabolic activity to suggest skeletal metastasis. Incidental CT findings: none IMPRESSION: 1. The corresponding to the MR abnormality, there is a focal area of increased uptake within the central aspect of segment 8 which is suspicious for underlying neoplasm, favor cholangiocarcinoma. 2. No additional areas of increased uptake identified to suggest metastatic disease. Electronically Signed   By: Kerby Moors M.D.   On: 05/19/2018 16:57   Ir Radiologist Eval & Mgmt  Result Date: 06/12/2018 Please refer to notes tab for details about interventional procedure. (Op Note)  US Abdomen Limited Ruq  Result Date: 05/20/2018 CLINICAL DATA:  Abnormal findings on prior CT and PET-CT EXAM: ULTRASOUND ABDOMEN LIMITED RIGHT UPPER QUADRANT COMPARISON:  05/19/2018 FINDINGS: Gallbladder: No gallstones or wall thickening visualized. No sonographic Murphy sign noted by sonographer. Common bile duct: Diameter: 4.2 mm. Liver: Some mild ductal dilatation is noted centrally in the right lobe. The area of abnormality seen on prior MRI is less well visualized on the ultrasound examination. There is some suggestion of increased echogenicity within the right lobe which would correspond to that seen on prior MRI. No other focal mass is seen. Portal vein is patent on color Doppler imaging with normal direction of blood flow towards the liver. IMPRESSION: Slight increased echogenicity identified in the area of abnormality on recent MRI and PET-CT although no definitive mass is seen. Some right-sided ductal dilatation is noted similar to that seen on prior MRI. Electronically Signed   By: Inez Catalina M.D.   On: 05/20/2018 16:14    Labs:  CBC: Recent Labs    07/05/17 1036 07/08/17 1024 04/30/18 1539  WBC 7.9 6.1 10.0  HGB  12.9* 13.2 14.3  HCT 38.8* 39.4 40.9  PLT 163.0 174.0 172    COAGS: Recent Labs    05/16/18 1146  INR 1.0    BMP: Recent Labs    09/09/17 1202 01/07/18 1001 04/09/18 1014 04/30/18 1539  NA 141 141 141 140  K 4.8 4.3 4.3 4.0  CL 105 106 105 106  CO2 28 30 30 23   GLUCOSE 86 104* 125* 142*  BUN 17 21 17  23*  CALCIUM 9.5 9.3 9.1 9.2  CREATININE 1.02 1.07 1.09 1.23  GFRNONAA  --   --   --  53*  GFRAA  --   --   --  >60    LIVER FUNCTION TESTS: Recent Labs    07/08/17 1024 09/09/17 1202 04/30/18 1539 05/14/18 1349  BILITOT 0.9 0.5 1.6* 0.5  AST 34 15 384* 15  ALT 130* 11 143* 30  ALKPHOS 139* 78 127* 93  PROT 6.8 6.9 7.3 6.5  ALBUMIN 3.8 4.2 4.2 4.1    TUMOR MARKERS: Recent Labs    05/16/18 1146  CEA 2.1  CA199 19    Assessment and Plan:  Asymptomatic 83 year old male with an indeterminate central hilar hepatic mass in segment 8 with associated segmental biliary dilatation.  Lesion roughly measures 2.6 cm by PET/CT with an SUV of 5.7.  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  No evidence of metastatic disease or extra hepatic disease.  No ascites.  GI has already assessed the lesion and  this would not be amenable to EUS biopsy.  Because of the central lesion location and close proximity to the portal vein and hepatic artery, lesion is high risk for biopsy related complication including significant bleeding risk.  Also because of the small size and challenges with targeting such a central lesion, a biopsy may be nondiagnostic.  After lengthy discussion with the patient, his wife and son, options were reviewed including attempting CT-guided biopsy or close surveillance at 3 months with a repeat CT.  All questions were addressed.  After discussion he would like to proceed with close surveillance at 3 months rather than proceed with a potentially high risk biopsy that may be nondiagnostic.  Plan: Repeat CT without and with contrast at 3 months  for close surveillance.  Thank you for this interesting consult.  I greatly enjoyed meeting Clete Kuch Greenstein and look forward to participating in their care.  A copy of this report was sent to the requesting provider on this date.  Electronically Signed: Greggory Keen 06/12/2018, 2:16 PM   I spent a total of  60 Minutes   in face to face in clinical consultation, greater than 50% of which was counseling/coordinating care for with a small indeterminate central liver lesion concerning for cholangiocarcinoma

## 2018-06-13 NOTE — Progress Notes (Signed)
Please have him come to the GI clinic in 2 to 3 weeks thanks Thanks Merrie Roof

## 2018-06-17 ENCOUNTER — Encounter: Payer: Self-pay | Admitting: Gastroenterology

## 2018-06-18 DIAGNOSIS — Z961 Presence of intraocular lens: Secondary | ICD-10-CM | POA: Diagnosis not present

## 2018-06-18 DIAGNOSIS — H43813 Vitreous degeneration, bilateral: Secondary | ICD-10-CM | POA: Diagnosis not present

## 2018-06-18 DIAGNOSIS — H527 Unspecified disorder of refraction: Secondary | ICD-10-CM | POA: Diagnosis not present

## 2018-06-22 ENCOUNTER — Encounter: Payer: Self-pay | Admitting: Family Medicine

## 2018-07-01 ENCOUNTER — Other Ambulatory Visit (INDEPENDENT_AMBULATORY_CARE_PROVIDER_SITE_OTHER): Payer: Medicare Other

## 2018-07-01 ENCOUNTER — Ambulatory Visit (INDEPENDENT_AMBULATORY_CARE_PROVIDER_SITE_OTHER): Payer: Medicare Other | Admitting: Gastroenterology

## 2018-07-01 ENCOUNTER — Encounter: Payer: Self-pay | Admitting: Gastroenterology

## 2018-07-01 VITALS — BP 120/80 | HR 66 | Ht 63.0 in | Wt 180.2 lb

## 2018-07-01 DIAGNOSIS — R9389 Abnormal findings on diagnostic imaging of other specified body structures: Secondary | ICD-10-CM | POA: Diagnosis not present

## 2018-07-01 DIAGNOSIS — L602 Onychogryphosis: Secondary | ICD-10-CM | POA: Diagnosis not present

## 2018-07-01 DIAGNOSIS — E1351 Other specified diabetes mellitus with diabetic peripheral angiopathy without gangrene: Secondary | ICD-10-CM | POA: Diagnosis not present

## 2018-07-01 DIAGNOSIS — R933 Abnormal findings on diagnostic imaging of other parts of digestive tract: Secondary | ICD-10-CM

## 2018-07-01 DIAGNOSIS — C221 Intrahepatic bile duct carcinoma: Secondary | ICD-10-CM | POA: Diagnosis not present

## 2018-07-01 LAB — COMPREHENSIVE METABOLIC PANEL
ALT: 16 U/L (ref 0–53)
AST: 15 U/L (ref 0–37)
Albumin: 4.1 g/dL (ref 3.5–5.2)
Alkaline Phosphatase: 79 U/L (ref 39–117)
BUN: 15 mg/dL (ref 6–23)
CALCIUM: 9.5 mg/dL (ref 8.4–10.5)
CHLORIDE: 105 meq/L (ref 96–112)
CO2: 31 mEq/L (ref 19–32)
Creatinine, Ser: 1.19 mg/dL (ref 0.40–1.50)
GFR: 62.28 mL/min (ref >60.00)
Glucose, Bld: 107 mg/dL — ABNORMAL HIGH (ref 70–99)
POTASSIUM: 4.4 meq/L (ref 3.5–5.1)
SODIUM: 142 meq/L (ref 135–145)
Total Bilirubin: 0.5 mg/dL (ref 0.2–1.2)
Total Protein: 6.6 g/dL (ref 6.0–8.3)

## 2018-07-01 LAB — CBC WITH DIFFERENTIAL/PLATELET
Basophils Absolute: 0 10*3/uL (ref 0.0–0.1)
Basophils Relative: 0.5 % (ref 0.0–3.0)
Eosinophils Absolute: 0.2 10*3/uL (ref 0.0–0.7)
Eosinophils Relative: 3.1 % (ref 0.0–5.0)
HCT: 39.5 % (ref 39.0–52.0)
Hemoglobin: 13.4 g/dL (ref 13.0–17.0)
Lymphocytes Relative: 29.4 % (ref 12.0–46.0)
Lymphs Abs: 2.1 10*3/uL (ref 0.7–4.0)
MCHC: 33.9 g/dL (ref 30.0–36.0)
MCV: 102.3 fl — ABNORMAL HIGH (ref 78.0–100.0)
Monocytes Absolute: 0.7 10*3/uL (ref 0.1–1.0)
Monocytes Relative: 9.8 % (ref 3.0–12.0)
Neutro Abs: 4.1 10*3/uL (ref 1.4–7.7)
Neutrophils Relative %: 57.2 % (ref 43.0–77.0)
Platelets: 160 10*3/uL (ref 150.0–400.0)
RBC: 3.86 Mil/uL — ABNORMAL LOW (ref 4.22–5.81)
RDW: 14 % (ref 11.5–15.5)
WBC: 7.1 10*3/uL (ref 4.0–10.5)

## 2018-07-01 NOTE — Progress Notes (Signed)
IMPRESSION and PLAN:    #1. Asymptomatic 82 year old male with an indeterminate central hilar hepatic mass with associated segmental R biliary dilatation.  (2.6 cm by PET/CT with an SUV of 5.7).  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  No evidence of metastatic disease or extra hepatic disease.  No ascites. Not amenable to EUS biopsy (Dr Ardis Hughs) of IR Bx (Dr Reesa Chew). - Rpt PET CT in Oct 2019 as advised by IR. Normal CA 19-9 and CEA. 04/2018. Fluctuating LFTs, normal 04/2018  - May need MRCP at that time. - Check CBC, CMP and CA 19-9 today. - FU thereafter. - Discussed extensively with the patient and patient's wife. I have offered EUS with possible ERCP (if EUS is positive), but they would like to hold off at this time.  Patient and patient's wife do understand if repeat PET scan/MRCP is positive, that would be the next step.       HPI:    Chief Complaint:   Ricardo Washington is a 83 y.o. male  For follow-up No GI complaints No jaundice or dark urine. No abdominal pain Denies having any weight loss Underwent colonoscopy on 06/11/2018-small colonic polyp, extensive diverticulosis. Has been to Dr. Reesa Chew with interventional radiology.  PET: 05/19/2018 IMPRESSION: 1. The corresponding to the MR abnormality, there is a focal area of increased uptake within the central aspect of segment 8 which is suspicious for underlying neoplasm, favor cholangiocarcinoma. 2. No additional areas of increased uptake identified to suggest metastatic disease.  MR 05/03/2018 1. Mild motion degradation. 2. Intrahepatic duct dilatation centered in the periphery of segment 8 with a central area of subtle ductal caught off, probable mild T2 hyperintensity, and delayed post-contrast hypointensity or hypoenhancement. 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 Potential clinical strategies include focused ultrasound to allow possible sampling, PET, or if patient is not  a good biopsy or surgical candidate, follow-up pre and post contrast abdominal MRI at 3 months. 3.  Aortic Atherosclerosis (ICD10-I70.0).  [place] 05/20/2018 IMPRESSION: Slight increased echogenicity identified in the area of abnormality on recent MRI and PET-CT although no definitive mass is seen. Some right-sided ductal dilatation is noted similar to that seen on prior MRI.  Past Medical History:  Diagnosis Date  . BPH with obstruction/lower urinary tract symptoms 10/27/2015   Dr. Karsten Ro  . Cataract   . Colon wall thickening 04/2018   "mass-like" per radiologist interpretation; colonoscopy as next step---GI plans on colonoscopy towards end of July 2019.  Marland Kitchen Coronary artery disease    with preserved LV function.  . Dementia   . Diabetes mellitus without complication ([place])   . Diverticulosis of colon    severe, entire colon  . Ectatic abdominal aorta ([place]) 01/2017   Abd u/s: 2.9 cm aortic ectasia--at risk for aneurism development.  Recheck aortic u/s 5 yrs.  . Erectile dysfunction due to arterial insufficiency   . Fatigue   . Gout    always 2nd toe L foot (uric acid 6.20 Dec 2014 per old records)  . Hepatic steatosis   . History of adenomatous polyp of colon 2002  . History of stomach ulcers   . Hyperlipidemia   . Hypertension   . Hypogonadism male   . Klebsiella sepsis ([place]) 01/2017   due to acute biliary tract infection (no stones) and acute diverticulitis.  . Liver mass 04/2018   GI ref: MRI abd-->?early cholangiocarcinoma-->tissue dx vs PET, vs repeat MRI 3 mo.  GI recommended PET-->results suggestive of  cholangiocarcinoma.  Also, GI is seeing if IR can reach this region for bx (can't reach by EUS per GI).  . Macrocytic anemia 01/2017   vit B12 borderline low and iron borderline low: checking hemoccults and starting vit B12 PO and iron PO as of 02/11/17.  . Myogenic ptosis of bilateral eyelids 2018   Plastic surgery in [place], [place] to do surg as of 03/2017.  Marland Kitchen NASH (nonalcoholic  steatohepatitis) 06/2017   LFTs up, abd u/s showed fatty liver but no other abnormality.  . Past use of tobacco    quit 1984  . Rectal bleeding   . Rosacea   . S/P coronary artery bypass graft x 3   . Urine incontinence    Dr. Karsten Ro    Current Outpatient Medications  Medication Sig Dispense Refill  . allopurinol (ZYLOPRIM) 100 MG tablet TAKE ONE TABLET BY MOUTH ON MONDAY, WEDNESDAY, AND FRIDAY  11  . allopurinol (ZYLOPRIM) 100 MG tablet TAKE ONE TABLET BY MOUTH ON MONDAY, WEDNESDAY, AND FRIDAY 36 tablet 3  . ALPRAZolam (NIRAVAM) 0.25 MG dissolvable tablet Take 0.25 mg by mouth at bedtime as needed for anxiety.    Marland Kitchen aspirin 325 MG tablet Take 325 mg by mouth daily.      . ferrous sulfate 325 (65 FE) MG EC tablet Take 325 mg by mouth daily with breakfast.    . finasteride (PROSCAR) 5 MG tablet TAKE ONE TABLET BY MOUTH DAILY 30 tablet 0  . Lancets (ONETOUCH ULTRASOFT) lancets Use to check blood sugar once daily 100 each 12  . lisinopril (PRINIVIL,ZESTRIL) 5 MG tablet TAKE ONE TABLET BY MOUTH DAILY 90 tablet 1  . memantine (NAMENDA) 10 MG tablet Take 1 tablet (10 mg total) 2 (two) times daily by mouth. 180 tablet 1  . metFORMIN (GLUCOPHAGE-XR) 500 MG 24 hr tablet TAKE ONE TABLET BY MOUTH TWICE DAILY WITH MEALS 180 tablet 1  . metoprolol tartrate (LOPRESSOR) 25 MG tablet TAKE ONE TABLET BY MOUTH TWICE DAILY 60 tablet 0  . MULTIPLE VITAMIN PO Take 1 tablet by mouth daily.     . rosuvastatin (CRESTOR) 10 MG tablet TAKE ONE TABLET BY MOUTH DAILY 90 tablet 1  . tamsulosin (FLOMAX) 0.4 MG CAPS capsule TAKE ONE CAPSULE BY MOUTH DAILY 90 capsule 3  . Trospium Chloride 60 MG CP24 TAKE ONE CAPSULE BY MOUTH DAILY 90 capsule 3  . vitamin B-12 (CYANOCOBALAMIN) 1000 MCG tablet Take 1,000 mcg by mouth daily.    . nitroGLYCERIN (NITROSTAT) 0.4 MG SL tablet Place 1 tablet (0.4 mg total) under the tongue every 5 (five) minutes as needed for chest pain. 25 tablet 3   No current facility-administered  medications for this visit.     Past Surgical History:  Procedure Laterality Date  . CARDIOVASCULAR STRESS TEST  08/28/2016   Low risk myoview, normal EF, no ischemia.  Marland Kitchen CATARACT EXTRACTION, BILATERAL    . COLONOSCOPY  2002, 2006, 02/25/2009; 06/11/18   Polyp x 1 2002, none 2006 or 2010.  Adenomatous polyp 06/11/18+  signif diverticulosis.  NO FURTHER COLONOSCOPIES DUE TO AGE.  Marland Kitchen CORONARY ARTERY BYPASS GRAFT  06/2009   descending,saphenous vein graft to first obtuse marginal, sequential saphenous vein graft to posterior descending and posterolateral  . Endoscopic vein harvest right thigh    . IR RADIOLOGIST EVAL & MGMT  06/12/2018  . PTCA    . TONSILLECTOMY    . UMBILICAL HERNIA REPAIR     2009  . VASECTOMY  Family History  Problem Relation Age of Onset  . Cancer Mother   . Cancer Father        pt points to LLQ as  area of cancer, so potentially could have been intestinal.     . Colon cancer Neg Hx   . Esophageal cancer Neg Hx   . Stomach cancer Neg Hx   . Rectal cancer Neg Hx     Social History   Tobacco Use  . Smoking status: Former Smoker    Packs/day: 1.00    Years: 30.00    Pack years: 30.00    Types: Cigarettes    Last attempt to quit: 03/15/1983    Years since quitting: 35.3  . Smokeless tobacco: Never Used  . Tobacco comment: Quit 1984  Substance Use Topics  . Alcohol use: Yes    Alcohol/week: 5.0 - 7.0 standard drinks    Types: 5 - 7 Glasses of wine per week    Comment: quit etoh 1999 but in ~ 2017 began having a glass of wine with dinner   . Drug use: No    Allergies  Allergen Reactions  . Sulfonamide Derivatives     Unknown, per pt and "cant stand it"     Review of Systems: All systems reviewed and negative except where noted in HPI.    Physical Exam:     BP 120/80   Pulse 66   Ht 5' 3"  (1.6 m)   Wt 180 lb 3.2 oz (81.7 kg)   SpO2 96%   BMI 31.92 kg/m  @WEIGHTLAST3 @ GENERAL:  Alert, oriented, cooperative, not in acute distress. PSYCH:  :Pleasant, normal mood and affect. HEENT:  conjunctiva pink, mucous membranes moist, neck supple without masses. No jaundice. CARDIAC:  S1 S2 normal. No murmers. PULM: Normal respiratory effort, lungs CTA bilaterally, no wheezing. ABDOMEN: Inspection: No visible peristalsis, no abnormal pulsations, skin normal.  Palpation/percussion: Soft, nontender, nondistended, no rigidity, no abnormal dullness to percussion, no hepatosplenomegaly and no palpable abdominal masses.  Auscultation: Normal bowel sounds, no abdominal bruits. Rectal exam: Deferred SKIN:  turgor, no lesions seen. Musculoskeletal:  Normal muscle tone, normal strength. NEURO: Alert and oriented x 3, no focal neurologic deficits.   Data Reviewed: I have personally reviewed following labs and imaging studies  Hepatic Function Latest Ref Rng & Units 05/14/2018 04/30/2018 09/09/2017  Total Protein 6.0 - 8.3 g/dL 6.5 7.3 6.9  Albumin 3.5 - 5.2 g/dL 4.1 4.2 4.2  AST 0 - 37 U/L 15 384(H) 15  ALT 0 - 53 U/L 30 143(H) 11  Alk Phosphatase 39 - 117 U/L 93 127(H) 78  Total Bilirubin 0.2 - 1.2 mg/dL 0.5 1.6(H) 0.5  Bilirubin, Direct 0.0 - 0.3 mg/dL 0.2 - -       Jerrik Housholder,MD 07/01/2018, 10:50 AM   CC McGowen, Adrian Blackwater, MD

## 2018-07-01 NOTE — Patient Instructions (Addendum)
If you are age 81 or older, your body mass index should be between 23-30. Your Body mass index is 31.92 kg/m. If this is out of the aforementioned range listed, please consider follow up with your Primary Care Provider.  If you are age 55 or younger, your body mass index should be between 19-25. Your Body mass index is 31.92 kg/m. If this is out of the aformentioned range listed, please consider follow up with your Primary Care Provider.   Please stop at the lab on the 2nd floor in [place] before you leave the office today.    You have been scheduled for an PET Scan at [facility] (1st floor of hospital) on 08/20/18 at 10am. Please arrive 30 minutes prior to your appointment for registration. Make certain not to have anything to eat or drink 6 hours prior to your appointment. Should you need to reschedule your appointment, please contact radiology at [phone].    Thank you,  Dr. Jackquline Denmark

## 2018-07-02 LAB — CANCER ANTIGEN 19-9: CA 19 9: 18 U/mL (ref <34)

## 2018-07-03 ENCOUNTER — Other Ambulatory Visit: Payer: Self-pay

## 2018-07-03 ENCOUNTER — Other Ambulatory Visit (INDEPENDENT_AMBULATORY_CARE_PROVIDER_SITE_OTHER): Payer: Medicare Other

## 2018-07-03 DIAGNOSIS — E538 Deficiency of other specified B group vitamins: Secondary | ICD-10-CM

## 2018-07-03 DIAGNOSIS — R5383 Other fatigue: Secondary | ICD-10-CM

## 2018-07-03 DIAGNOSIS — R9389 Abnormal findings on diagnostic imaging of other specified body structures: Secondary | ICD-10-CM

## 2018-07-03 DIAGNOSIS — R5381 Other malaise: Secondary | ICD-10-CM

## 2018-07-03 DIAGNOSIS — D124 Benign neoplasm of descending colon: Secondary | ICD-10-CM

## 2018-07-03 DIAGNOSIS — R718 Other abnormality of red blood cells: Secondary | ICD-10-CM

## 2018-07-03 DIAGNOSIS — R933 Abnormal findings on diagnostic imaging of other parts of digestive tract: Secondary | ICD-10-CM

## 2018-07-03 LAB — FOLATE: Folate: >24.1 ng/mL (ref >5.9)

## 2018-07-03 LAB — VITAMIN B12: VITAMIN B 12: 886 pg/mL (ref 211–911)

## 2018-07-07 ENCOUNTER — Encounter: Payer: Self-pay | Admitting: Family Medicine

## 2018-07-07 ENCOUNTER — Other Ambulatory Visit: Payer: Self-pay | Admitting: Family Medicine

## 2018-07-07 NOTE — Telephone Encounter (Signed)
Request sent to pts PCP.

## 2018-07-10 ENCOUNTER — Ambulatory Visit (INDEPENDENT_AMBULATORY_CARE_PROVIDER_SITE_OTHER): Payer: Medicare Other | Admitting: Family Medicine

## 2018-07-10 ENCOUNTER — Encounter: Payer: Self-pay | Admitting: Family Medicine

## 2018-07-10 VITALS — BP 117/64 | HR 63 | Temp 97.5°F | Resp 16 | Ht 63.0 in | Wt 179.1 lb

## 2018-07-10 DIAGNOSIS — E7800 Pure hypercholesterolemia, unspecified: Secondary | ICD-10-CM | POA: Diagnosis not present

## 2018-07-10 DIAGNOSIS — E119 Type 2 diabetes mellitus without complications: Secondary | ICD-10-CM | POA: Diagnosis not present

## 2018-07-10 DIAGNOSIS — F0390 Unspecified dementia without behavioral disturbance: Secondary | ICD-10-CM

## 2018-07-10 DIAGNOSIS — R160 Hepatomegaly, not elsewhere classified: Secondary | ICD-10-CM | POA: Diagnosis not present

## 2018-07-10 DIAGNOSIS — E038 Other specified hypothyroidism: Secondary | ICD-10-CM

## 2018-07-10 DIAGNOSIS — I10 Essential (primary) hypertension: Secondary | ICD-10-CM

## 2018-07-10 DIAGNOSIS — E039 Hypothyroidism, unspecified: Secondary | ICD-10-CM | POA: Diagnosis not present

## 2018-07-10 LAB — T4, FREE: Free T4: 0.58 ng/dL — ABNORMAL LOW (ref 0.60–1.60)

## 2018-07-10 LAB — HEMOGLOBIN A1C: Hgb A1c MFr Bld: 6.1 % (ref 4.6–6.5)

## 2018-07-10 LAB — TSH: TSH: 5.24 u[IU]/mL — ABNORMAL HIGH (ref 0.35–4.50)

## 2018-07-10 NOTE — Progress Notes (Signed)
OFFICE VISIT  07/10/2018   CC:  Chief Complaint  Patient presents with  . Follow-up    RCI, pt is not fasting.    HPI:    Patient is a 83 y.o. Caucasian male who presents for f/u HTN, DM 2, HLD. Also recently found to have indeterminate perihilar central hepatic mass.  Per GI as of 07/01/18 f/u: offered EUS with possible ERCP (if EUS is positive), but they would like to hold off at this time.  Patient and patient's wife do understand if repeat PET scan/MRCP is positive, that would be the next step. He is set up for repeat PET.  DM: no home glucose checks in weeks.  Usually around 120. HTN: no home checks. HLD: tolerating daily rosuvastatin. Insomnia: has not been taking any alprazolam for this.  ROS: no CP, no SOB, no wheezing, no cough, no dizziness, no HAs, no rashes, no melena/hematochezia.  No polyuria or polydipsia.  No myalgias or arthralgias.  No abnormal wt loss.  No n/v/abd pain.   Past Medical History:  Diagnosis Date  . BPH with obstruction/lower urinary tract symptoms 10/27/2015   Dr. Karsten Ro  . Cataract   . Colon wall thickening 04/2018   "mass-like" per radiologist interpretation; colonoscopy as next step---f/u colonoscopy showed 'tics but o/w normal.  NO further colonoscopies needed due to age.  . Coronary artery disease    with preserved LV function.  . Dementia   . Diabetes mellitus without complication ([place])   . Diverticulosis of colon    severe, entire colon  . Ectatic abdominal aorta ([place]) 01/2017   Abd u/s: 2.9 cm aortic ectasia--at risk for aneurism development.  Recheck aortic u/s 5 yrs.  . Erectile dysfunction due to arterial insufficiency   . Fatigue   . Gout    always 2nd toe L foot (uric acid 6.20 Dec 2014 per old records)  . Hepatic steatosis   . History of adenomatous polyp of colon 2002  . History of stomach ulcers   . Hyperlipidemia   . Hypertension   . Hypogonadism male   . Klebsiella sepsis ([place]) 01/2017   due to acute biliary tract  infection (no stones) and acute diverticulitis.  . Liver mass 04/2018   GI ref: MRI abd-->?early cholangiocarcinoma-->tissue dx vs PET, vs repeat MRI 3 mo.  GI recommended PET-->results suggestive of cholangiocarcinoma.  Lesion not amenable to bx by IR or GI.  Possible retry by Dr. [name] in GI-->pt to consider as of 06/2018.  . Macrocytic anemia 01/2017   vit B12 borderline low and iron borderline low: checking hemoccults and starting vit B12 PO and iron PO as of 02/11/17.  Vit B12 and folate normal as of GI f/u 06/2018.  . Myogenic ptosis of bilateral eyelids 2018   Plastic surgery in [place], [place] to do surg as of 03/2017.  Marland Kitchen NASH (nonalcoholic steatohepatitis) 06/2017   LFTs up, abd u/s showed fatty liver but no other abnormality.  . Past use of tobacco    quit 1984  . Rectal bleeding   . Rosacea   . S/P coronary artery bypass graft x 3   . Urine incontinence    Dr. Karsten Ro    Past Surgical History:  Procedure Laterality Date  . CARDIOVASCULAR STRESS TEST  08/28/2016   Low risk myoview, normal EF, no ischemia.  Marland Kitchen CATARACT EXTRACTION, BILATERAL    . COLONOSCOPY  2002, 2006, 02/25/2009; 06/11/18   Polyp x 1 2002, none 2006 or 2010.  Adenomatous polyp 06/11/18+  signif diverticulosis.  NO FURTHER COLONOSCOPIES DUE TO AGE.  Marland Kitchen CORONARY ARTERY BYPASS GRAFT  06/2009   descending,saphenous vein graft to first obtuse marginal, sequential saphenous vein graft to posterior descending and posterolateral  . Endoscopic vein harvest right thigh    . IR RADIOLOGIST EVAL & MGMT  06/12/2018  . PTCA    . TONSILLECTOMY    . UMBILICAL HERNIA REPAIR     2009  . VASECTOMY      Outpatient Medications Prior to Visit  Medication Sig Dispense Refill  . allopurinol (ZYLOPRIM) 100 MG tablet TAKE ONE TABLET BY MOUTH ON MONDAY, WEDNESDAY, AND FRIDAY 36 tablet 3  . aspirin 325 MG tablet Take 325 mg by mouth daily.      . ferrous sulfate 325 (65 FE) MG EC tablet Take 325 mg by mouth daily with breakfast.    .  finasteride (PROSCAR) 5 MG tablet TAKE ONE TABLET BY MOUTH DAILY 30 tablet 0  . Lancets (ONETOUCH ULTRASOFT) lancets Use to check blood sugar once daily 100 each 12  . lisinopril (PRINIVIL,ZESTRIL) 5 MG tablet TAKE ONE TABLET BY MOUTH DAILY 90 tablet 1  . memantine (NAMENDA) 10 MG tablet Take 1 tablet (10 mg total) 2 (two) times daily by mouth. 180 tablet 1  . metFORMIN (GLUCOPHAGE-XR) 500 MG 24 hr tablet TAKE ONE TABLET BY MOUTH TWICE DAILY WITH MEALS 180 tablet 1  . metoprolol tartrate (LOPRESSOR) 25 MG tablet TAKE ONE TABLET BY MOUTH TWICE DAILY 60 tablet 0  . MULTIPLE VITAMIN PO Take 1 tablet by mouth daily.     . rosuvastatin (CRESTOR) 10 MG tablet TAKE ONE TABLET BY MOUTH DAILY 90 tablet 1  . tamsulosin (FLOMAX) 0.4 MG CAPS capsule TAKE ONE CAPSULE BY MOUTH DAILY 90 capsule 3  . Trospium Chloride 60 MG CP24 TAKE ONE CAPSULE BY MOUTH DAILY 90 capsule 3  . vitamin B-12 (CYANOCOBALAMIN) 1000 MCG tablet Take 1,000 mcg by mouth daily.    Marland Kitchen allopurinol (ZYLOPRIM) 100 MG tablet TAKE ONE TABLET BY MOUTH ON MONDAY, WEDNESDAY, AND FRIDAY  11  . ALPRAZolam (NIRAVAM) 0.25 MG dissolvable tablet Take 0.25 mg by mouth at bedtime as needed for anxiety.    . nitroGLYCERIN (NITROSTAT) 0.4 MG SL tablet Place 1 tablet (0.4 mg total) under the tongue every 5 (five) minutes as needed for chest pain. 25 tablet 3   No facility-administered medications prior to visit.     Allergies  Allergen Reactions  . Sulfonamide Derivatives     Unknown, per pt and "cant stand it"    ROS As per HPI  PE: Blood pressure 117/64, pulse 63, temperature (!) 97.5 F (36.4 C), temperature source Oral, resp. rate 16, height 5' 3"  (1.6 m), weight 179 lb 2 oz (81.3 kg), SpO2 98 %. Gen: Alert, well appearing.  Patient is oriented to person, place, time, and situation. AFFECT: pleasant, lucid thought and speech. YYQ:MGNO: no injection, icteris, swelling, or exudate.  EOMI, PERRLA. Mouth: lips without lesion/swelling.  Oral  mucosa pink and moist. Oropharynx without erythema, exudate, or swelling.  CV: RRR, no m/r/g.   LUNGS: CTA bilat, nonlabored resps, good aeration in all lung fields. EXT: no clubbing or cyanosis.  no edema.    LABS:   Lab Results  Component Value Date   TSH 4.76 (H) 09/09/2017   T3TOTAL 73 (L) 09/11/2017    Lab Results  Component Value Date   WBC 7.1 07/01/2018   HGB 13.4 07/01/2018   HCT 39.5 07/01/2018  MCV 102.3 (H) 07/01/2018   PLT 160.0 07/01/2018   Lab Results  Component Value Date   IRON 70 03/08/2017   IRON 76 03/08/2017   TIBC 316 03/08/2017   FERRITIN 55.0 03/08/2017   Lab Results  Component Value Date   VITAMINB12 886 07/03/2018    Lab Results  Component Value Date   CREATININE 1.19 07/01/2018   BUN 15 07/01/2018   NA 142 07/01/2018   K 4.4 07/01/2018   CL 105 07/01/2018   CO2 31 07/01/2018   Lab Results  Component Value Date   ALT 16 07/01/2018   AST 15 07/01/2018   ALKPHOS 79 07/01/2018   BILITOT 0.5 07/01/2018   Lab Results  Component Value Date   CHOL 123 01/07/2018   Lab Results  Component Value Date   HDL 52.40 01/07/2018   Lab Results  Component Value Date   LDLCALC 52 01/07/2018   Lab Results  Component Value Date   TRIG 89.0 01/07/2018   Lab Results  Component Value Date   CHOLHDL 2 01/07/2018   Lab Results  Component Value Date   HGBA1C 6.2 04/09/2018    IMPRESSION AND PLAN:  1) DM 2, historically well controlled.  Compliant with meds. HbA1c today. Lytes/cr stable on check 10 d/a.  2) HTN: no home monitoring, but normal bp here. Lytes/cr stable on check 10 d/a.  3) HLD: tolerating statin.  LDL excellent at 52 six months ago.  Hepatic panel normal 10 d/a. Repeat FLP 6 mo.  4) Liver mass, w/u suspicious for cholangiocarcinoma at this point. He is set up for repeat PET scan. If +, then the next step needed is tissue sample---pt still considering all of this.  5) Dementia: memory worsening last month or so  per wife: suspect it is directly related to increased stress/information processing needed for his medical issues lately.  No med additions/changes at this time.  An After Visit Summary was printed and given to the patient.  FOLLOW UP: Return in about 3 months (around 10/10/2018) for routine chronic illness f/u.  Signed:  Crissie Sickles, MD           07/10/2018

## 2018-07-11 ENCOUNTER — Encounter: Payer: Self-pay | Admitting: *Deleted

## 2018-07-11 LAB — T3: T3, Total: 82 ng/dL (ref 76–181)

## 2018-07-24 ENCOUNTER — Ambulatory Visit (INDEPENDENT_AMBULATORY_CARE_PROVIDER_SITE_OTHER): Payer: Medicare Other

## 2018-07-24 DIAGNOSIS — Z23 Encounter for immunization: Secondary | ICD-10-CM | POA: Diagnosis not present

## 2018-08-04 ENCOUNTER — Other Ambulatory Visit: Payer: Self-pay | Admitting: Family Medicine

## 2018-08-11 ENCOUNTER — Other Ambulatory Visit: Payer: Self-pay | Admitting: Family Medicine

## 2018-08-20 ENCOUNTER — Ambulatory Visit (HOSPITAL_COMMUNITY)
Admission: RE | Admit: 2018-08-20 | Discharge: 2018-08-20 | Disposition: A | Discharge status: Home / Self Care | Payer: Medicare Other | Source: Ambulatory Visit | Attending: Gastroenterology | Admitting: Gastroenterology

## 2018-08-20 DIAGNOSIS — R933 Abnormal findings on diagnostic imaging of other parts of digestive tract: Secondary | ICD-10-CM | POA: Insufficient documentation

## 2018-08-20 DIAGNOSIS — R932 Abnormal findings on diagnostic imaging of liver and biliary tract: Secondary | ICD-10-CM | POA: Diagnosis not present

## 2018-08-20 DIAGNOSIS — R9389 Abnormal findings on diagnostic imaging of other specified body structures: Secondary | ICD-10-CM

## 2018-08-20 DIAGNOSIS — C189 Malignant neoplasm of colon, unspecified: Secondary | ICD-10-CM | POA: Diagnosis not present

## 2018-08-20 LAB — GLUCOSE, CAPILLARY: GLUCOSE-CAPILLARY: 111 mg/dL — AB (ref 70–99)

## 2018-08-20 MED ORDER — FLUDEOXYGLUCOSE F - 18 (FDG) INJECTION
8.5000 | Freq: Once | INTRAVENOUS | Status: AC | PRN
  Administered 2018-08-20: 8.5 via INTRAVENOUS

## 2018-08-21 ENCOUNTER — Other Ambulatory Visit (HOSPITAL_COMMUNITY): Payer: Self-pay | Admitting: Interventional Radiology

## 2018-08-22 ENCOUNTER — Encounter: Payer: Self-pay | Admitting: Family Medicine

## 2018-08-22 ENCOUNTER — Other Ambulatory Visit: Payer: Self-pay

## 2018-08-22 DIAGNOSIS — R160 Hepatomegaly, not elsewhere classified: Secondary | ICD-10-CM

## 2018-08-28 ENCOUNTER — Other Ambulatory Visit (INDEPENDENT_AMBULATORY_CARE_PROVIDER_SITE_OTHER): Payer: Medicare Other

## 2018-08-28 ENCOUNTER — Encounter: Payer: Self-pay | Admitting: Gastroenterology

## 2018-08-28 ENCOUNTER — Ambulatory Visit (INDEPENDENT_AMBULATORY_CARE_PROVIDER_SITE_OTHER): Payer: Medicare Other | Admitting: Gastroenterology

## 2018-08-28 VITALS — BP 122/74 | HR 72 | Ht 63.5 in | Wt 182.5 lb

## 2018-08-28 DIAGNOSIS — R160 Hepatomegaly, not elsewhere classified: Secondary | ICD-10-CM | POA: Diagnosis not present

## 2018-08-28 DIAGNOSIS — R9389 Abnormal findings on diagnostic imaging of other specified body structures: Secondary | ICD-10-CM

## 2018-08-28 NOTE — Patient Instructions (Signed)
If you are age 81 or older, your body mass index should be between 23-30. Your Body mass index is 31.82 kg/m. If this is out of the aforementioned range listed, please consider follow up with your Primary Care Provider.  If you are age 25 or younger, your body mass index should be between 19-25. Your Body mass index is 31.82 kg/m. If this is out of the aformentioned range listed, please consider follow up with your Primary Care Provider.   Please go to the lab on the 2nd floor [address] before you leave the office today.   Thank you,  Dr. Jackquline Denmark

## 2018-08-28 NOTE — Progress Notes (Signed)
IMPRESSION and PLAN:    #1. Asymptomatic 83 year old male with an indeterminate central hilar hepatic mass with associated segmental R biliary dilatation.  (2.6 cm by PET/CT with an SUV of 5.7).  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  No evidence of metastatic disease or extra hepatic disease.  No ascites. Not amenable to EUS biopsy (Dr Ardis Hughs) or IR Bx (Dr Reesa Chew). Neg CT PET for progression 08/2018.  Plan: - Rpt PET CT in April 2020. Normal CA 19-9 and CEA. 04/2018. Fluctuating LFTs, normal 04/2018  - May need MRCP at that time. - Check CBC, CMP and CA 19-9 today. - FU in 6 months, earlier in case of any problems. - Discussed extensively with the patient and patient's wife. I have offered EUS with possible ERCP (if EUS is positive) with Dr Rush Landmark, but they would like to hold off at this time.  Patient and patient's wife do understand if repeat PET scan/MRCP is positive, that would be the next step.       HPI:    Chief Complaint:   Ricardo Washington is a 83 y.o. male  For follow-up Doing very well. No GI complaints No jaundice or dark urine. No abdominal pain Denies having any weight loss Underwent colonoscopy on 06/11/2018-small colonic polyp, extensive diverticulosis. PET 08/20/2018-showed decreased SUV, no increase in size, no new lesions. Gained 2lb since last visit.  PET 08/20/2018: IMPRESSION: 1. Focus of activity centrally within liver remains but is slightly less intense and less focal. No clear evidence of new lesions within the liver. 2. No evidence of metastatic disease outside the liver. 3. Extensive activity in the bowel is physiologic and could mask a colon lesion.   PET: 05/19/2018 IMPRESSION: 1. The corresponding to the MR abnormality, there is a focal area of increased uptake within the central aspect of segment 8 which is suspicious for underlying neoplasm, favor cholangiocarcinoma. 2. No additional areas of increased uptake  identified to suggest metastatic disease.  MR 05/03/2018 1. Mild motion degradation. 2. Intrahepatic duct dilatation centered in the periphery of segment 8 with a central area of subtle ductal caught off, probable mild T2 hyperintensity, and delayed post-contrast hypointensity or hypoenhancement. 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 Potential clinical strategies include focused ultrasound to allow possible sampling, PET, or if patient is not a good biopsy or surgical candidate, follow-up pre and post contrast abdominal MRI at 3 months. 3.  Aortic Atherosclerosis (ICD10-I70.0).  [place] 05/20/2018 IMPRESSION: Slight increased echogenicity identified in the area of abnormality on recent MRI and PET-CT although no definitive mass is seen. Some right-sided ductal dilatation is noted similar to that seen on prior MRI.  Past Medical History:  Diagnosis Date  . BPH with obstruction/lower urinary tract symptoms 10/27/2015   Dr. Karsten Ro  . Cataract   . Colon wall thickening 04/2018   "mass-like" per radiologist interpretation; colonoscopy as next step---f/u colonoscopy showed 'tics but o/w normal.  NO further colonoscopies needed due to age.  . Coronary artery disease    with preserved LV function.  . Dementia ([place])   . Diabetes mellitus without complication ([place])   . Diverticulosis of colon    severe, entire colon  . Ectatic abdominal aorta ([place]) 01/2017   Abd u/s: 2.9 cm aortic ectasia--at risk for aneurism development.  Recheck aortic u/s 5 yrs.  . Erectile dysfunction due to arterial insufficiency   . Fatigue   . Gout    always 2nd toe L foot (uric acid  6.20 Dec 2014 per old records)  . Hepatic steatosis   . History of adenomatous polyp of colon 2002  . History of stomach ulcers   . Hyperlipidemia   . Hypertension   . Hypogonadism male   . Klebsiella sepsis ([place]) 01/2017   due to acute biliary tract infection (no stones) and acute diverticulitis.  . Liver mass  04/2018   GI ref: MRI abd-->?early cholangiocarcinoma-->tissue dx vs PET, vs repeat MRI 3 mo.  GI recommended PET-->results suggestive of cholangiocarcinoma.  Lesion not amenable to bx by IR or GI.  Possible retry by Dr. [name] in GI-->pt to consider as of 06/2018.  PET by Onc 08/20/18-->focus of activity centrally within liver remains but is slightly less focal and intense, no new liver lesions, no mets.  . Macrocytic anemia 01/2017   vit B12 borderline low and iron borderline low: checking hemoccults and starting vit B12 PO and iron PO as of 02/11/17.  Vit B12 and folate normal as of GI f/u 06/2018.  . Myogenic ptosis of bilateral eyelids 2018   Plastic surgery in [place], [place] to do surg as of 03/2017.  Marland Kitchen NASH (nonalcoholic steatohepatitis) 06/2017   LFTs up, abd u/s showed fatty liver but no other abnormality.  . Past use of tobacco    quit 1984  . Rectal bleeding   . Rosacea   . S/P coronary artery bypass graft x 3   . Urine incontinence    Dr. Karsten Ro    Current Outpatient Medications  Medication Sig Dispense Refill  . allopurinol (ZYLOPRIM) 100 MG tablet TAKE ONE TABLET BY MOUTH ON MONDAY, WEDNESDAY, AND FRIDAY  11  . allopurinol (ZYLOPRIM) 100 MG tablet TAKE ONE TABLET BY MOUTH ON MONDAY, WEDNESDAY, AND FRIDAY 36 tablet 3  . ALPRAZolam (NIRAVAM) 0.25 MG dissolvable tablet Take 0.25 mg by mouth at bedtime as needed for anxiety.    Marland Kitchen aspirin 325 MG tablet Take 325 mg by mouth daily.      . ferrous sulfate 325 (65 FE) MG EC tablet Take 325 mg by mouth daily with breakfast.    . finasteride (PROSCAR) 5 MG tablet TAKE ONE TABLET BY MOUTH DAILY 90 tablet 1  . Lancets (ONETOUCH ULTRASOFT) lancets Use to check blood sugar once daily 100 each 12  . lisinopril (PRINIVIL,ZESTRIL) 5 MG tablet TAKE ONE TABLET BY MOUTH DAILY 90 tablet 1  . memantine (NAMENDA) 10 MG tablet Take 1 tablet (10 mg total) 2 (two) times daily by mouth. (Patient taking differently: Take 10 mg by mouth daily. ) 180 tablet 1  .  metFORMIN (GLUCOPHAGE-XR) 500 MG 24 hr tablet TAKE ONE TABLET BY MOUTH TWICE DAILY WITH MEALS 180 tablet 1  . metoprolol tartrate (LOPRESSOR) 25 MG tablet TAKE ONE TABLET BY MOUTH TWICE DAILY 60 tablet 5  . MULTIPLE VITAMIN PO Take 1 tablet by mouth daily.     . rosuvastatin (CRESTOR) 10 MG tablet TAKE ONE TABLET BY MOUTH DAILY 90 tablet 1  . tamsulosin (FLOMAX) 0.4 MG CAPS capsule TAKE ONE CAPSULE BY MOUTH DAILY 90 capsule 3  . Trospium Chloride 60 MG CP24 TAKE ONE CAPSULE BY MOUTH DAILY 90 capsule 3  . vitamin B-12 (CYANOCOBALAMIN) 1000 MCG tablet Take 1,000 mcg by mouth daily.    . nitroGLYCERIN (NITROSTAT) 0.4 MG SL tablet Place 1 tablet (0.4 mg total) under the tongue every 5 (five) minutes as needed for chest pain. 25 tablet 3   No current facility-administered medications for this visit.  Past Surgical History:  Procedure Laterality Date  . CARDIOVASCULAR STRESS TEST  08/28/2016   Low risk myoview, normal EF, no ischemia.  Marland Kitchen CATARACT EXTRACTION, BILATERAL    . COLONOSCOPY  2002, 2006, 02/25/2009; 06/11/18   Polyp x 1 2002, none 2006 or 2010.  Adenomatous polyp 06/11/18+  signif diverticulosis.  NO FURTHER COLONOSCOPIES DUE TO AGE.  Marland Kitchen CORONARY ARTERY BYPASS GRAFT  06/2009   descending,saphenous vein graft to first obtuse marginal, sequential saphenous vein graft to posterior descending and posterolateral  . Endoscopic vein harvest right thigh    . IR RADIOLOGIST EVAL & MGMT  06/12/2018  . PTCA    . TONSILLECTOMY    . UMBILICAL HERNIA REPAIR     2009  . VASECTOMY      Family History  Problem Relation Age of Onset  . Cancer Mother   . Cancer Father        pt points to LLQ as  area of cancer, so potentially could have been intestinal.     . Colon cancer Neg Hx   . Esophageal cancer Neg Hx   . Stomach cancer Neg Hx   . Rectal cancer Neg Hx     Social History   Tobacco Use  . Smoking status: Former Smoker    Packs/day: 1.00    Years: 30.00    Pack years: 30.00    Types:  Cigarettes    Last attempt to quit: 03/15/1983    Years since quitting: 35.4  . Smokeless tobacco: Never Used  . Tobacco comment: Quit 1984  Substance Use Topics  . Alcohol use: Yes    Alcohol/week: 5.0 - 7.0 standard drinks    Types: 5 - 7 Glasses of wine per week    Comment: quit etoh 1999 but in ~ 2017 began having a glass of wine with dinner   . Drug use: No    Allergies  Allergen Reactions  . Sulfonamide Derivatives     Unknown, per pt and "cant stand it"     Review of Systems: All systems reviewed and negative except where noted in HPI.    Physical Exam:    Today's Vitals   08/28/18 1334  BP: 122/74  Pulse: 72  Weight: 182 lb 8 oz (82.8 kg)  Height: 5' 3.5" (1.613 m)   Body mass index is 31.82 kg/m.   BP 122/74   Pulse 72   Ht 5' 3.5" (1.613 m)   Wt 182 lb 8 oz (82.8 kg)   BMI 31.82 kg/m  @WEIGHTLAST3 @ GENERAL:  Alert, oriented, cooperative, not in acute distress. PSYCH: :Pleasant, normal mood and affect. HEENT:  conjunctiva pink, mucous membranes moist, neck supple without masses. No jaundice. CARDIAC:  S1 S2 normal. No murmers. PULM: Normal respiratory effort, lungs CTA bilaterally, no wheezing. ABDOMEN: Inspection: No visible peristalsis, no abnormal pulsations, skin normal.  Palpation/percussion: Soft, nontender, nondistended, no rigidity, no abnormal dullness to percussion, no hepatosplenomegaly and no palpable abdominal masses.  Auscultation: Normal bowel sounds, no abdominal bruits. Rectal exam: Deferred SKIN:  turgor, no lesions seen. Musculoskeletal:  Normal muscle tone, normal strength. NEURO: Alert and oriented x 3, no focal neurologic deficits.   Data Reviewed: I have personally reviewed following labs and imaging studies  Hepatic Function Latest Ref Rng & Units 07/01/2018 05/14/2018 04/30/2018  Total Protein 6.0 - 8.3 g/dL 6.6 6.5 7.3  Albumin 3.5 - 5.2 g/dL 4.1 4.1 4.2  AST 0 - 37 U/L 15 15 384(H)  ALT 0 - 53 U/L  16 30 143(H)  Alk  Phosphatase 39 - 117 U/L 79 93 127(H)  Total Bilirubin 0.2 - 1.2 mg/dL 0.5 0.5 1.6(H)  Bilirubin, Direct 0.0 - 0.3 mg/dL - 0.2 -  Copy of the PET given to the patient     Imagene Boss,MD 08/28/2018, 1:46 PM   CC McGowen, Adrian Blackwater, MD

## 2018-08-29 LAB — CBC WITH DIFFERENTIAL/PLATELET
BASOS ABS: 0.1 10*3/uL (ref 0.0–0.1)
Basophils Relative: 1.2 % (ref 0.0–3.0)
EOS ABS: 0.2 10*3/uL (ref 0.0–0.7)
Eosinophils Relative: 3 % (ref 0.0–5.0)
HEMATOCRIT: 39.5 % (ref 39.0–52.0)
HEMOGLOBIN: 13.3 g/dL (ref 13.0–17.0)
LYMPHS PCT: 30 % (ref 12.0–46.0)
Lymphs Abs: 1.8 10*3/uL (ref 0.7–4.0)
MCHC: 33.7 g/dL (ref 30.0–36.0)
MCV: 103.5 fl — AB (ref 78.0–100.0)
MONOS PCT: 9.5 % (ref 3.0–12.0)
Monocytes Absolute: 0.6 10*3/uL (ref 0.1–1.0)
NEUTROS ABS: 3.4 10*3/uL (ref 1.4–7.7)
Neutrophils Relative %: 56.3 % (ref 43.0–77.0)
PLATELETS: 169 10*3/uL (ref 150.0–400.0)
RBC: 3.81 Mil/uL — AB (ref 4.22–5.81)
RDW: 13.6 % (ref 11.5–15.5)
WBC: 6 10*3/uL (ref 4.0–10.5)

## 2018-08-29 LAB — COMPREHENSIVE METABOLIC PANEL
ALBUMIN: 4 g/dL (ref 3.5–5.2)
ALT: 12 U/L (ref 0–53)
AST: 14 U/L (ref 0–37)
Alkaline Phosphatase: 68 U/L (ref 39–117)
BILIRUBIN TOTAL: 0.4 mg/dL (ref 0.2–1.2)
BUN: 15 mg/dL (ref 6–23)
CALCIUM: 9.1 mg/dL (ref 8.4–10.5)
CO2: 26 mEq/L (ref 19–32)
Chloride: 107 mEq/L (ref 96–112)
Creatinine, Ser: 1.08 mg/dL (ref 0.40–1.50)
GFR: 69.62 mL/min (ref >60.00)
Glucose, Bld: 101 mg/dL — ABNORMAL HIGH (ref 70–99)
Potassium: 4.2 mEq/L (ref 3.5–5.1)
Sodium: 142 mEq/L (ref 135–145)
Total Protein: 6.5 g/dL (ref 6.0–8.3)

## 2018-08-29 LAB — CANCER ANTIGEN 19-9: CA 19-9: 12 U/mL (ref <34)

## 2018-08-29 LAB — CEA: CEA: 2 ng/mL

## 2018-09-01 ENCOUNTER — Encounter: Payer: Self-pay | Admitting: Family Medicine

## 2018-09-02 ENCOUNTER — Other Ambulatory Visit (HOSPITAL_COMMUNITY): Payer: Self-pay | Admitting: Interventional Radiology

## 2018-09-02 ENCOUNTER — Telehealth: Payer: Self-pay | Admitting: Gastroenterology

## 2018-09-02 DIAGNOSIS — R160 Hepatomegaly, not elsewhere classified: Secondary | ICD-10-CM

## 2018-09-02 NOTE — Telephone Encounter (Signed)
Spoke with pt and he is aware, see result note.

## 2018-09-08 DIAGNOSIS — L602 Onychogryphosis: Secondary | ICD-10-CM | POA: Diagnosis not present

## 2018-09-08 DIAGNOSIS — E1351 Other specified diabetes mellitus with diabetic peripheral angiopathy without gangrene: Secondary | ICD-10-CM | POA: Diagnosis not present

## 2018-09-15 ENCOUNTER — Other Ambulatory Visit: Payer: Self-pay | Admitting: Family Medicine

## 2018-09-18 ENCOUNTER — Ambulatory Visit
Admission: RE | Admit: 2018-09-18 | Discharge: 2018-09-18 | Disposition: A | Discharge status: Home / Self Care | Payer: Medicare Other | Source: Ambulatory Visit | Attending: Interventional Radiology | Admitting: Interventional Radiology

## 2018-09-18 ENCOUNTER — Encounter: Payer: Self-pay | Admitting: Radiology

## 2018-09-18 DIAGNOSIS — K7689 Other specified diseases of liver: Secondary | ICD-10-CM | POA: Diagnosis not present

## 2018-09-18 DIAGNOSIS — R160 Hepatomegaly, not elsewhere classified: Secondary | ICD-10-CM

## 2018-09-18 HISTORY — PX: IR RADIOLOGIST EVAL & MGMT: IMG5224

## 2018-09-18 NOTE — Progress Notes (Signed)
Patient ID: Ricardo Washington, male   DOB: Jul 22, 1935, 83 y.o.   MRN: [id number]       Chief Complaint:   Indeterminant small central liver abnormality/mass with associated segmental biliary dilatation  Referring Physician(s): Chrysten Woulfe  History of Present Illness: Ricardo Washington is a 83 y.o. male with mild dementia, previous history of diverticulosis, and hemorrhoids.  He is followed closely by GI.  Surveillance imaging demonstrated a indeterminate central hepatic hilar mass with developing focal intrahepatic biliary dilatation within segment 8.  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  No extrahepatic disease.  No adenopathy.  He remains asymptomatic.  No significant abdominal pain or flank pain.  No signs of jaundice or liver failure.  He is followed closely by GI as well.  He returns for review surveillance imaging.  He has had a PET scan in the interval.  Past Medical History:  Diagnosis Date  . BPH with obstruction/lower urinary tract symptoms 10/27/2015   Dr. Karsten Ro  . Cataract   . Colon wall thickening 04/2018   "mass-like" per radiologist interpretation; colonoscopy as next step---f/u colonoscopy showed 'tics but o/w normal.  NO further colonoscopies needed due to age.  . Coronary artery disease    with preserved LV function.  . Dementia ([place])   . Diabetes mellitus without complication ([place])   . Diverticulosis of colon    severe, entire colon  . Ectatic abdominal aorta ([place]) 01/2017   Abd u/s: 2.9 cm aortic ectasia--at risk for aneurism development.  Recheck aortic u/s 5 yrs.  . Erectile dysfunction due to arterial insufficiency   . Fatigue   . Gout    always 2nd toe L foot (uric acid 6.20 Dec 2014 per old records)  . Hepatic steatosis   . History of adenomatous polyp of colon 2002  . History of stomach ulcers   . Hyperlipidemia   . Hypertension   . Hypogonadism male   . Klebsiella sepsis ([place]) 01/2017   due to acute biliary tract infection  (no stones) and acute diverticulitis.  . Liver mass 04/2018   GI ref: MRI abd-->?early cholangiocarcinoma-->tissue dx vs PET, vs repeat MRI 3 mo.  GI recommended PET-->results suggestive of cholangiocarcinoma.  Not amenable to bx by IR or GI.  Poss retry by Dr. [name] in GI--pt decl 06/2018.Marland Kitchen  PET by Onc 08/20/18-->focus of activity centrally within liver remains but is slightly less focal and intense, no new liver lesions, no mets.  Repeat PET 02/2019.  . Macrocytic anemia 01/2017   vit B12 borderline low and iron borderline low: checking hemoccults and starting vit B12 PO and iron PO as of 02/11/17.  Vit B12 and folate normal as of GI f/u 06/2018.  . Myogenic ptosis of bilateral eyelids 2018   Plastic surgery in [place], [place] to do surg as of 03/2017.  Marland Kitchen NASH (nonalcoholic steatohepatitis) 06/2017   LFTs up, abd u/s showed fatty liver but no other abnormality.  . Past use of tobacco    quit 1984  . Rectal bleeding   . Rosacea   . S/P coronary artery bypass graft x 3   . Urine incontinence    Dr. Karsten Ro    Past Surgical History:  Procedure Laterality Date  . CARDIOVASCULAR STRESS TEST  08/28/2016   Low risk myoview, normal EF, no ischemia.  Marland Kitchen CATARACT EXTRACTION, BILATERAL    . COLONOSCOPY  2002, 2006, 02/25/2009; 06/11/18   Polyp x 1 2002, none 2006 or 2010.  Adenomatous polyp 06/11/18+  signif  diverticulosis.  NO FURTHER COLONOSCOPIES DUE TO AGE.  Marland Kitchen CORONARY ARTERY BYPASS GRAFT  06/2009   descending,saphenous vein graft to first obtuse marginal, sequential saphenous vein graft to posterior descending and posterolateral  . Endoscopic vein harvest right thigh    . IR RADIOLOGIST EVAL & MGMT  06/12/2018  . PTCA    . TONSILLECTOMY    . UMBILICAL HERNIA REPAIR     2009  . VASECTOMY      Allergies: Sulfonamide derivatives  Medications: Prior to Admission medications   Medication Sig Start Date End Date Taking? Authorizing Provider  allopurinol (ZYLOPRIM) 100 MG tablet TAKE ONE TABLET BY MOUTH  ON MONDAY, WEDNESDAY, AND FRIDAY 02/11/18   [provider]  allopurinol (ZYLOPRIM) 100 MG tablet TAKE ONE TABLET BY MOUTH ON MONDAY, WEDNESDAY, AND FRIDAY 05/06/18   McGowen, Adrian Blackwater, MD  ALPRAZolam (NIRAVAM) 0.25 MG dissolvable tablet Take 0.25 mg by mouth at bedtime as needed for anxiety.    [provider]  aspirin 325 MG tablet Take 325 mg by mouth daily.      [provider]  ferrous sulfate 325 (65 FE) MG EC tablet Take 325 mg by mouth daily with breakfast.    [provider]  finasteride (PROSCAR) 5 MG tablet TAKE ONE TABLET BY MOUTH DAILY 08/04/18   McGowen, Adrian Blackwater, MD  Lancets [facility] ULTRASOFT) lancets Use to check blood sugar once daily 04/15/18   McGowen, Adrian Blackwater, MD  lisinopril (PRINIVIL,ZESTRIL) 5 MG tablet TAKE ONE TABLET BY MOUTH DAILY 08/04/18   McGowen, Adrian Blackwater, MD  memantine (NAMENDA) 10 MG tablet Take 1 tablet (10 mg total) 2 (two) times daily by mouth. Patient taking differently: Take 10 mg by mouth daily.  05/14/18   McGowen, Adrian Blackwater, MD  metFORMIN (GLUCOPHAGE-XR) 500 MG 24 hr tablet TAKE ONE TABLET BY MOUTH TWICE DAILY WITH MEALS 09/15/18   McGowen, Adrian Blackwater, MD  metoprolol tartrate (LOPRESSOR) 25 MG tablet TAKE ONE TABLET BY MOUTH TWICE DAILY 08/11/18   McGowen, Adrian Blackwater, MD  MULTIPLE VITAMIN PO Take 1 tablet by mouth daily.     [provider]  nitroGLYCERIN (NITROSTAT) 0.4 MG SL tablet Place 1 tablet (0.4 mg total) under the tongue every 5 (five) minutes as needed for chest pain. 08/20/16 06/12/18  Josue Hector, MD  rosuvastatin (CRESTOR) 10 MG tablet TAKE ONE TABLET BY MOUTH DAILY 05/05/18   McGowen, Adrian Blackwater, MD  tamsulosin (FLOMAX) 0.4 MG CAPS capsule TAKE ONE CAPSULE BY MOUTH DAILY 02/11/18   McGowen, Adrian Blackwater, MD  Trospium Chloride 60 MG CP24 TAKE ONE CAPSULE BY MOUTH DAILY 06/10/18   McGowen, Adrian Blackwater, MD  vitamin B-12 (CYANOCOBALAMIN) 1000 MCG tablet Take 1,000 mcg by mouth daily.    [provider]     Family  History  Problem Relation Age of Onset  . Cancer Mother   . Cancer Father        pt points to LLQ as  area of cancer, so potentially could have been intestinal.     . Colon cancer Neg Hx   . Esophageal cancer Neg Hx   . Stomach cancer Neg Hx   . Rectal cancer Neg Hx     Social History   Socioeconomic History  . Marital status: Married    Spouse name: Not on file  . Number of children: 3  . Years of education: 86  . Highest education level: Not on file  Occupational History  . Not on file  Social Needs  . Financial resource strain: Not on file  . Food insecurity:    Worry: Not on file    Inability: Not on file  . Transportation needs:    Medical: Not on file    Non-medical: Not on file  Tobacco Use  . Smoking status: Former Smoker    Packs/day: 1.00    Years: 30.00    Pack years: 30.00    Types: Cigarettes    Last attempt to quit: 03/15/1983    Years since quitting: 35.5  . Smokeless tobacco: Never Used  . Tobacco comment: Quit 1984  Substance and Sexual Activity  . Alcohol use: Yes    Alcohol/week: 5.0 - 7.0 standard drinks    Types: 5 - 7 Glasses of wine per week    Comment: quit etoh 1999 but in ~ 2017 began having a glass of wine with dinner   . Drug use: No  . Sexual activity: Never  Lifestyle  . Physical activity:    Days per week: Not on file    Minutes per session: Not on file  . Stress: Not on file  Relationships  . Social connections:    Talks on phone: Not on file    Gets together: Not on file    Attends religious service: Not on file    Active member of club or organization: Not on file    Attends meetings of clubs or organizations: Not on file    Relationship status: Not on file  Other Topics Concern  . Not on file  Social History Narrative   Married 1957, has 3 sons.   [place]. 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    End of life Care: DNR, no prolonged heroic measures or  prolonged supportive care.    Former smoker, quit 1984.   Quit alcohol when dx'd with DM in 2009. ~ 2017 began having glass of wine with dinner.   No exercise.     Review of Systems: A 12 point ROS discussed and pertinent positives are indicated in the HPI above.  All other systems are negative.  Review of Systems  Vital Signs: BP 119/64   Pulse (!) 59   Temp 98.1 F (36.7 C) (Oral)   Resp 16   Ht 5' 6"  (1.676 m)   SpO2 97%   BMI 29.46 kg/m   Physical Exam  Constitutional: He is oriented to person, place, and time. He appears well-developed and well-nourished. No distress.  Eyes: Conjunctivae are normal. No scleral icterus.  Cardiovascular: Normal rate, regular rhythm and normal heart sounds.  No murmur heard. Pulmonary/Chest: Effort normal and breath sounds normal. No respiratory distress.  Abdominal: Soft. Bowel sounds are normal. He exhibits no distension and no mass. There is no tenderness. There is no rebound.  Musculoskeletal: Normal range of motion. He exhibits no edema.  Neurological: He is alert and oriented to person, place, and time.  Skin: Skin is warm and dry. No rash noted. He is not diaphoretic. No erythema.  Psychiatric: He has a normal mood and affect.     Imaging: Nm Pet Image Restag (ps) Skull Base To Thigh  Result Date: 08/20/2018 CLINICAL DATA:  Sub treatment strategy for colorectal carcinoma. Liver mass. EXAM: NUCLEAR MEDICINE PET SKULL BASE TO THIGH TECHNIQUE: 8.5 mCi F-18 FDG was injected intravenously. Full-ring PET imaging was performed from the skull base to thigh after the radiotracer. CT data was obtained and used for  attenuation correction and anatomic localization. Fasting blood glucose: 111 mg/dl COMPARISON:  PET-CT 05/19/2018, MRI 05/03/2018 FINDINGS: Mediastinal blood pool activity: SUV max 2.2 NECK: No hypermetabolic lymph nodes in the neck. Incidental CT findings: none CHEST: No hypermetabolic mediastinal or hilar nodes. No suspicious  pulmonary nodules on the CT scan. Incidental CT findings: none ABDOMEN/PELVIS: Persistent focus of activity centrally within the RIGHT hepatic lobe with SUV max equal 4.3 compared to 5.7. Visually the lesion appears slightly smaller. No clear lesion on the CT portion exam. No clear new lesions are present. There is diffuse activity throughout the colon. Multiple diverticula. No hypermetabolic abdominopelvic lymph nodes.  Normal pancreas Incidental CT findings: none SKELETON: No focal hypermetabolic activity to suggest skeletal metastasis. Incidental CT findings: none IMPRESSION: 1. Focus of activity centrally within liver remains but is slightly less intense and less focal. No clear evidence of new lesions within the liver. 2. No evidence of metastatic disease outside the liver. 3. Extensive activity in the bowel is physiologic and could mask a colon lesion. Electronically Signed   By: Suzy Bouchard M.D.   On: 08/20/2018 14:10    Labs:  CBC: Recent Labs    04/30/18 1539 07/01/18 1115 08/28/18 1407  WBC 10.0 7.1 6.0  HGB 14.3 13.4 13.3  HCT 40.9 39.5 39.5  PLT 172 160.0 169.0    COAGS: Recent Labs    05/16/18 1146  INR 1.0    BMP: Recent Labs    04/09/18 1014 04/30/18 1539 07/01/18 1115 08/28/18 1407  NA 141 140 142 142  K 4.3 4.0 4.4 4.2  CL 105 106 105 107  CO2 30 23 31 26   GLUCOSE 125* 142* 107* 101*  BUN 17 23* 15 15  CALCIUM 9.1 9.2 9.5 9.1  CREATININE 1.09 1.23 1.19 1.08  GFRNONAA  --  53*  --   --   GFRAA  --  >60  --   --     LIVER FUNCTION TESTS: Recent Labs    04/30/18 1539 05/14/18 1349 07/01/18 1115 08/28/18 1407  BILITOT 1.6* 0.5 0.5 0.4  AST 384* 15 15 14   ALT 143* 30 16 12   ALKPHOS 127* 93 79 68  PROT 7.3 6.5 6.6 6.5  ALBUMIN 4.2 4.1 4.1 4.0    TUMOR MARKERS: Recent Labs    05/16/18 1146 07/01/18 1115 08/28/18 1407  CEA 2.1  --  2.0  CA199 19 18 12     Assessment and Plan:  Asymptomatic 83 year old male with a indeterminate  ill-defined central hepatic hilar abnormality with associated segmental biliary dilatation.  Surveillance imaging demonstrates no significant interval change, additional hepatic disease, extrahepatic disease or any metastatic process.  No ascites.  These findings are encouraging.  No new lab abnormalities.  Tumor markers are normal.  Plan: Agree with repeat PET/CT in 6 months for continued surveillance.  Thank you for this interesting consult.  I greatly enjoyed meeting Ricardo Washington and look forward to participating in their care.  A copy of this report was sent to the requesting provider on this date.  Electronically Signed: Greggory Keen 09/18/2018, 10:57 AM   I spent a total of    25 Minutes in face to face in clinical consultation, greater than 50% of which was counseling/coordinating care for this patient with a indeterminate central liver lesion.

## 2018-09-19 DIAGNOSIS — M19011 Primary osteoarthritis, right shoulder: Secondary | ICD-10-CM

## 2018-09-19 HISTORY — DX: Primary osteoarthritis, right shoulder: M19.011

## 2018-09-23 DIAGNOSIS — M19011 Primary osteoarthritis, right shoulder: Secondary | ICD-10-CM | POA: Diagnosis not present

## 2018-09-23 DIAGNOSIS — M25511 Pain in right shoulder: Secondary | ICD-10-CM | POA: Diagnosis not present

## 2018-10-01 DIAGNOSIS — M19011 Primary osteoarthritis, right shoulder: Secondary | ICD-10-CM | POA: Diagnosis not present

## 2018-10-08 ENCOUNTER — Encounter: Payer: Self-pay | Admitting: Family Medicine

## 2018-10-10 ENCOUNTER — Encounter: Payer: Self-pay | Admitting: Family Medicine

## 2018-10-10 ENCOUNTER — Ambulatory Visit (INDEPENDENT_AMBULATORY_CARE_PROVIDER_SITE_OTHER): Payer: Medicare Other | Admitting: Family Medicine

## 2018-10-10 VITALS — BP 108/63 | HR 68 | Temp 97.8°F | Resp 16 | Ht 63.5 in | Wt 185.4 lb

## 2018-10-10 DIAGNOSIS — E119 Type 2 diabetes mellitus without complications: Secondary | ICD-10-CM | POA: Diagnosis not present

## 2018-10-10 DIAGNOSIS — I10 Essential (primary) hypertension: Secondary | ICD-10-CM

## 2018-10-10 DIAGNOSIS — E038 Other specified hypothyroidism: Secondary | ICD-10-CM

## 2018-10-10 DIAGNOSIS — E039 Hypothyroidism, unspecified: Secondary | ICD-10-CM | POA: Diagnosis not present

## 2018-10-10 DIAGNOSIS — R079 Chest pain, unspecified: Secondary | ICD-10-CM | POA: Diagnosis not present

## 2018-10-10 LAB — POCT GLYCOSYLATED HEMOGLOBIN (HGB A1C): HEMOGLOBIN A1C: 5.9 % — AB (ref 4.0–5.6)

## 2018-10-10 NOTE — Progress Notes (Signed)
OFFICE VISIT  10/12/2018   CC:  Chief Complaint  Patient presents with  . Follow-up    RCI, pt is not fasting.      HPI:    Patient is a 83 y.o. Caucasian male who presents for 3 mo f/u DM 2, HTN.   DM: fasting glucose 120s.  No checks later in the day. ?diabetic diet--pt has hard time answering this clearly.  HTN: no home bp monitoring.  He does have home bp cuff.  Complains of some recent L sided CP.  Gets "not very severe, but a bit bothersome", random timing, no trigger, stays a couple minutes.  No SOB/chest heaviness or pressure, no L arm pain or jaw pain.  No diaphoresis or palpitations, no dizziness.   This chest pain has occurred approx 2-3 times in the last 1 mo.  He has not used sl NTG. 08/2016 some atypical chest pain and f/u myoview 08/28/16 non ischemic low risk EF 58%. CABG 2010, followed by Dr. Johnsie Cancel.  ROS: no wheezing, no cough, no dizziness, no HAs, no rashes, no melena/hematochezia.  No polyuria or polydipsia.  No myalgias or arthralgias.   Past Medical History:  Diagnosis Date  . BPH with obstruction/lower urinary tract symptoms 10/27/2015   Dr. Karsten Ro  . Cataract   . Colon wall thickening 04/2018   "mass-like" per radiologist interpretation; colonoscopy as next step---f/u colonoscopy showed 'tics but o/w normal.  NO further colonoscopies needed due to age.  . Coronary artery disease    with preserved LV function.  . Dementia ([place])   . Diabetes mellitus without complication ([place])   . Diverticulosis of colon    severe, entire colon  . Ectatic abdominal aorta ([place]) 01/2017   Abd u/s: 2.9 cm aortic ectasia--at risk for aneurism development.  Recheck aortic u/s 5 yrs.  . Erectile dysfunction due to arterial insufficiency   . Fatigue   . Gout    always 2nd toe L foot (uric acid 6.20 Dec 2014 per old records)  . Hepatic steatosis   . History of adenomatous polyp of colon 2002  . History of stomach ulcers   . Hyperlipidemia   . Hypertension   .  Hypogonadism male   . Klebsiella sepsis ([place]) 01/2017   due to acute biliary tract infection (no stones) and acute diverticulitis.  . Liver mass 04/2018   GI ref: MRI abd-->?early cholangiocarcinoma-->tissue dx vs PET, vs repeat MRI 3 mo.  GI recommended PET-->results suggestive of cholangiocarcinoma.  Not amenable to bx by IR or GI.  Poss retry by Dr. [name] in GI--pt decl 06/2018.Marland Kitchen  PET by Onc 08/20/18-->focus of activity centrally within liver remains but is slightly less focal and intense, no new liver lesions, no mets.  Repeat PET 02/2019.  . Macrocytic anemia 01/2017   vit B12 borderline low and iron borderline low: checking hemoccults and starting vit B12 PO and iron PO as of 02/11/17.  Vit B12 and folate normal as of GI f/u 06/2018.  . Myogenic ptosis of bilateral eyelids 2018   Plastic surgery in [place], [place] to do surg as of 03/2017.  Marland Kitchen NASH (nonalcoholic steatohepatitis) 06/2017   LFTs up, abd u/s showed fatty liver but no other abnormality.  . Osteoarthritis of right shoulder 09/2018   Near end-stage --->glenohumeral joint-->intra-articular steroid injection 09/2018 (Dr. Delilah Shan).  . Past use of tobacco    quit 1984  . Rectal bleeding   . Rosacea   . S/P coronary artery bypass graft x 3   .  Urine incontinence    Dr. Karsten Ro    Past Surgical History:  Procedure Laterality Date  . CARDIOVASCULAR STRESS TEST  08/28/2016   Low risk myoview, normal EF, no ischemia.  Marland Kitchen CATARACT EXTRACTION, BILATERAL    . COLONOSCOPY  2002, 2006, 02/25/2009; 06/11/18   Polyp x 1 2002, none 2006 or 2010.  Adenomatous polyp 06/11/18+  signif diverticulosis.  NO FURTHER COLONOSCOPIES DUE TO AGE.  Marland Kitchen CORONARY ARTERY BYPASS GRAFT  06/2009   descending,saphenous vein graft to first obtuse marginal, sequential saphenous vein graft to posterior descending and posterolateral  . Endoscopic vein harvest right thigh    . IR RADIOLOGIST EVAL & MGMT  06/12/2018  . IR RADIOLOGIST EVAL & MGMT  09/18/2018  . PTCA    .  TONSILLECTOMY    . UMBILICAL HERNIA REPAIR     2009  . VASECTOMY      Outpatient Medications Prior to Visit  Medication Sig Dispense Refill  . allopurinol (ZYLOPRIM) 100 MG tablet TAKE ONE TABLET BY MOUTH ON MONDAY, WEDNESDAY, AND FRIDAY  11  . allopurinol (ZYLOPRIM) 100 MG tablet TAKE ONE TABLET BY MOUTH ON MONDAY, WEDNESDAY, AND FRIDAY 36 tablet 3  . ALPRAZolam (NIRAVAM) 0.25 MG dissolvable tablet Take 0.25 mg by mouth at bedtime as needed for anxiety.    Marland Kitchen aspirin 325 MG tablet Take 325 mg by mouth daily.      . ferrous sulfate 325 (65 FE) MG EC tablet Take 325 mg by mouth daily with breakfast.    . finasteride (PROSCAR) 5 MG tablet TAKE ONE TABLET BY MOUTH DAILY 90 tablet 1  . Lancets (ONETOUCH ULTRASOFT) lancets Use to check blood sugar once daily 100 each 12  . lisinopril (PRINIVIL,ZESTRIL) 5 MG tablet TAKE ONE TABLET BY MOUTH DAILY 90 tablet 1  . memantine (NAMENDA) 10 MG tablet Take 1 tablet (10 mg total) 2 (two) times daily by mouth. (Patient taking differently: Take 10 mg by mouth daily. ) 180 tablet 1  . metFORMIN (GLUCOPHAGE-XR) 500 MG 24 hr tablet TAKE ONE TABLET BY MOUTH TWICE DAILY WITH MEALS 180 tablet 1  . metoprolol tartrate (LOPRESSOR) 25 MG tablet TAKE ONE TABLET BY MOUTH TWICE DAILY 60 tablet 5  . MULTIPLE VITAMIN PO Take 1 tablet by mouth daily.     . rosuvastatin (CRESTOR) 10 MG tablet TAKE ONE TABLET BY MOUTH DAILY 90 tablet 1  . tamsulosin (FLOMAX) 0.4 MG CAPS capsule TAKE ONE CAPSULE BY MOUTH DAILY 90 capsule 3  . Trospium Chloride 60 MG CP24 TAKE ONE CAPSULE BY MOUTH DAILY 90 capsule 3  . vitamin B-12 (CYANOCOBALAMIN) 1000 MCG tablet Take 1,000 mcg by mouth daily.    . nitroGLYCERIN (NITROSTAT) 0.4 MG SL tablet Place 1 tablet (0.4 mg total) under the tongue every 5 (five) minutes as needed for chest pain. 25 tablet 3   No facility-administered medications prior to visit.     Allergies  Allergen Reactions  . Sulfonamide Derivatives     Unknown, per pt and  "cant stand it"    ROS As per HPI  PE: Blood pressure 108/63, pulse 68, temperature 97.8 F (36.6 C), temperature source Oral, resp. rate 16, height 5' 3.5" (1.613 m), weight 185 lb 6 oz (84.1 kg), SpO2 96 %. Gen: Alert, well appearing.  Patient is oriented to person, place, time, and situation. AFFECT: pleasant, lucid thought and speech. ZOX:WRUE: no injection, icteris, swelling, or exudate.  EOMI, PERRLA. Mouth: lips without lesion/swelling.  Oral mucosa pink and moist. Oropharynx  without erythema, exudate, or swelling.  CV: RRR, no m/r/g.   LUNGS: CTA bilat, nonlabored resps, good aeration in all lung fields. Abd: soft, NT/ND EXT: no clubbing or cyanosis.  no edema.    LABS:  Lab Results  Component Value Date   TSH 5.24 (H) 07/10/2018   Lab Results  Component Value Date   WBC 6.0 08/28/2018   HGB 13.3 08/28/2018   HCT 39.5 08/28/2018   MCV 103.5 (H) 08/28/2018   PLT 169.0 08/28/2018   Lab Results  Component Value Date   VITAMINB12 886 07/03/2018   Lab Results  Component Value Date   IRON 70 03/08/2017   IRON 76 03/08/2017   TIBC 316 03/08/2017   FERRITIN 55.0 03/08/2017    Lab Results  Component Value Date   CREATININE 1.08 08/28/2018   BUN 15 08/28/2018   NA 142 08/28/2018   K 4.2 08/28/2018   CL 107 08/28/2018   CO2 26 08/28/2018   Lab Results  Component Value Date   ALT 12 08/28/2018   AST 14 08/28/2018   ALKPHOS 68 08/28/2018   BILITOT 0.4 08/28/2018   Lab Results  Component Value Date   CHOL 123 01/07/2018   Lab Results  Component Value Date   HDL 52.40 01/07/2018   Lab Results  Component Value Date   LDLCALC 52 01/07/2018   Lab Results  Component Value Date   TRIG 89.0 01/07/2018   Lab Results  Component Value Date   CHOLHDL 2 01/07/2018   Lab Results  Component Value Date   HGBA1C 5.9 (A) 10/10/2018   12 lead EKG: NSR, rate 61, no ischemic changes.  No ectopy.  Intervals and duration normal.  No hypertrophy.  Axis normal.   T waves flat in III and aVF.  Compared to EKG 04/2018, there is no change except inverted T waves in aVF and III are less apparent on today's EKG.  IMPRESSION AND PLAN:  1) DM 2: well controlled. POC A1c 5.9% today. No changes today.  2) Chest pain: infrequent, mild, w/out any associated symptoms-->occurred 2-3 times in the last 1 month. Has CAD, is s/p CABG 2010. EKG today essentially normal. Has sl NTG on hand to use prn. Has f/u appt with Dr. Johnsie Cancel scheduled for 10/20/18-->I do not think any w/u is indicated prior to this. Signs/symptoms to call or return for (or go to ED/call 911 for) were reviewed and pt expressed understanding.  3) HTN: The current medical regimen is effective;  continue present plan and medications. Lytes/cr stable 08/28/18.  4) Subclinical hypothyroidism: recheck thyroid labs at next f/u.  An After Visit Summary was printed and given to the patient.  FOLLOW UP: Return in about 3 months (around 01/10/2019) for routine chronic illness f/u.  Signed:  Crissie Sickles, MD           10/12/2018

## 2018-10-13 NOTE — Progress Notes (Signed)
Patient ID: Ricardo Washington, male   DOB: 09/08/1935, 83 y.o.   MRN: [id number]     Ricardo Washington is seen today f/u CAD  CABG 03/21/11  by Dr Roxan Hockey. He had a lima to lad, svg PDA/PLA and svg OM. His LV functoin is preserved.  Tendency to bradycardia. Beta blocker has been decreased Last myovue October 2017 normal EF 58% no ischemia  Celebrated his 22 th anniversary recently Married his high school sweetheart. Has 3 kids on in [place]  Seen by primary 10/10/18 atypical pain Left sided, random lasts minutes no radiation or associated symptoms has had 2-3 episodes Last month. Has not used nitro ECG in office reviewed and was normal   In speaking to patient pain was in his left abdominal area and side not chest He has no angina with activity  ROS: Denies fever, malais, weight loss, blurry vision, decreased visual acuity, cough, sputum, SOB, hemoptysis, pleuritic pain, palpitaitons, heartburn, abdominal pain, melena, lower extremity edema, claudication, or rash.  All other systems reviewed and negative  General: BP 128/68   Pulse 64   Ht 5' 3.5" (1.613 m)   Wt 185 lb (83.9 kg)   SpO2 98%   BMI 32.26 kg/m  Affect appropriate Healthy:  appears stated age 83: normal Neck supple with no adenopathy JVP normal no bruits no thyromegaly Lungs clear with no wheezing and good diaphragmatic motion Heart:  S1/S2 no murmur, no rub, gallop or click PMI normal post sternotomy  Abdomen: benighn, BS positve, no tenderness, no AAA no bruit.  No HSM or HJR Distal pulses intact with no bruits No edema Neuro non-focal Skin warm and dry No muscular weakness     Current Outpatient Medications  Medication Sig Dispense Refill  . allopurinol (ZYLOPRIM) 100 MG tablet TAKE ONE TABLET BY MOUTH ON MONDAY, WEDNESDAY, AND FRIDAY  11  . allopurinol (ZYLOPRIM) 100 MG tablet TAKE ONE TABLET BY MOUTH ON MONDAY, WEDNESDAY, AND FRIDAY 36 tablet 3  . ALPRAZolam (NIRAVAM) 0.25 MG dissolvable tablet Take  0.25 mg by mouth at bedtime as needed for anxiety.    Marland Kitchen aspirin 325 MG tablet Take 325 mg by mouth daily.      . ferrous sulfate 325 (65 FE) MG EC tablet Take 325 mg by mouth daily with breakfast.    . finasteride (PROSCAR) 5 MG tablet TAKE ONE TABLET BY MOUTH DAILY 90 tablet 1  . Lancets (ONETOUCH ULTRASOFT) lancets Use to check blood sugar once daily 100 each 12  . lisinopril (PRINIVIL,ZESTRIL) 5 MG tablet TAKE ONE TABLET BY MOUTH DAILY 90 tablet 1  . memantine (NAMENDA) 10 MG tablet Take 1 tablet (10 mg total) 2 (two) times daily by mouth. (Patient taking differently: Take 10 mg by mouth daily. ) 180 tablet 1  . metFORMIN (GLUCOPHAGE-XR) 500 MG 24 hr tablet TAKE ONE TABLET BY MOUTH TWICE DAILY WITH MEALS 180 tablet 1  . metoprolol tartrate (LOPRESSOR) 25 MG tablet TAKE ONE TABLET BY MOUTH TWICE DAILY 60 tablet 5  . MULTIPLE VITAMIN PO Take 1 tablet by mouth daily.     . nitroGLYCERIN (NITROSTAT) 0.4 MG SL tablet Place 1 tablet (0.4 mg total) under the tongue every 5 (five) minutes as needed for chest pain. 25 tablet 3  . rosuvastatin (CRESTOR) 10 MG tablet TAKE ONE TABLET BY MOUTH DAILY 90 tablet 1  . tamsulosin (FLOMAX) 0.4 MG CAPS capsule TAKE ONE CAPSULE BY MOUTH DAILY 90 capsule 3  . Trospium Chloride 60 MG CP24  TAKE ONE CAPSULE BY MOUTH DAILY 90 capsule 3  . vitamin B-12 (CYANOCOBALAMIN) 1000 MCG tablet Take 1,000 mcg by mouth daily.     No current facility-administered medications for this visit.     Allergies  Sulfonamide derivatives  Electrocardiogram:   10/10/18 SR rate 61 normal   Assessment and Plan  CAD/CABG: 2012  Low risk non ischemic myovue 08/28/16 EF 58% continue medical Rx  Atypical pain observe   DM: Discussed low carb diet.  A1c 6.1 09/09/17 .  Continue current medications.  Prostatism  F/u primary check PSA  HTN: Well controlled.  Continue current medications and low sodium Dash type diet.    Chol:   Lab Results  Component Value Date   LDLCALC 52  01/07/2018  Indicates labs in [place] have been fine  Bradycardia:  On Aricept now better on lower dose lopressor   F/u with me in a year     [employer]

## 2018-10-20 ENCOUNTER — Ambulatory Visit (INDEPENDENT_AMBULATORY_CARE_PROVIDER_SITE_OTHER): Payer: Medicare Other | Admitting: Cardiovascular Disease

## 2018-10-20 ENCOUNTER — Encounter: Payer: Self-pay | Admitting: Cardiovascular Disease

## 2018-10-20 VITALS — BP 128/68 | HR 64 | Ht 63.5 in | Wt 185.0 lb

## 2018-10-20 DIAGNOSIS — Z951 Presence of aortocoronary bypass graft: Secondary | ICD-10-CM | POA: Diagnosis not present

## 2018-10-20 NOTE — Patient Instructions (Signed)

## 2018-10-27 ENCOUNTER — Other Ambulatory Visit: Payer: Self-pay | Admitting: Family Medicine

## 2018-11-03 ENCOUNTER — Ambulatory Visit (INDEPENDENT_AMBULATORY_CARE_PROVIDER_SITE_OTHER): Payer: Medicare Other | Admitting: Gastroenterology

## 2018-11-03 ENCOUNTER — Encounter: Payer: Self-pay | Admitting: Gastroenterology

## 2018-11-03 VITALS — BP 116/62 | HR 67 | Ht 65.0 in | Wt 185.1 lb

## 2018-11-03 DIAGNOSIS — R1310 Dysphagia, unspecified: Secondary | ICD-10-CM | POA: Diagnosis not present

## 2018-11-03 DIAGNOSIS — K219 Gastro-esophageal reflux disease without esophagitis: Secondary | ICD-10-CM | POA: Diagnosis not present

## 2018-11-03 MED ORDER — OMEPRAZOLE 20 MG PO CPDR: 20.0000 mg | DELAYED_RELEASE_CAPSULE | Freq: Every day | ORAL | 3 refills | Status: DC

## 2018-11-03 NOTE — Progress Notes (Signed)
IMPRESSION and PLAN:    #1. GERD with intermittent dysphagia #2. Asymptomatic 83 year old male with an indeterminate central hilar hepatic mass with associated segmental R biliary dilatation.  (2.6 cm by PET/CT with an SUV of 5.7).  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  No evidence of metastatic disease or extra hepatic disease.  No ascites. Not amenable to EUS biopsy (Dr Ardis Hughs) or IR Bx (Dr Reesa Chew). Neg CT PET for progression 08/2018. Normal CA19-9.  Plan: - Omeprazole 49m po qd #30, 2 refills. - Ba swallow with barium tablet. - If Ba swallow is abn, then EGD with dil. - I have instructed patient to chew foods especially meats and breads well and eat slowly. - Rpt PET CT in April 2020. Nl LFTs 06/2018, 08/2018.       HPI:    Chief Complaint:   RLUCIA MCCREADIEis a 83y.o. male  Accompanied by his wife Having episodes of " choking on food".  Happened 2 weeks ago when he was eating at [employer].  He was eating too fast per patient's wife.  Had to go to the restroom and induce vomiting. Has been having some "phlegm in the throat" Feels better over the last 1 week since he has been eating small bites. Occasional heartburn.  No jaundice or dark urine. No abdominal pain Denies having any weight loss   Past GI procedures: Underwent colonoscopy on 06/11/2018-small colonic polyp, extensive diverticulosis. PET 08/20/2018-showed decreased SUV, no increase in size, no new lesions.  PET 08/20/2018: IMPRESSION: 1. Focus of activity centrally within liver remains but is slightly less intense and less focal. No clear evidence of new lesions within the liver. 2. No evidence of metastatic disease outside the liver. 3. Extensive activity in the bowel is physiologic and could mask a colon lesion.   PET: 05/19/2018 IMPRESSION: 1. The corresponding to the MR abnormality, there is a focal area of increased uptake within the central aspect of segment 8 which  is suspicious for underlying neoplasm, favor cholangiocarcinoma. 2. No additional areas of increased uptake identified to suggest metastatic disease.  MR 05/03/2018 1. Mild motion degradation. 2. Intrahepatic duct dilatation centered in the periphery of segment 8 with a central area of subtle ductal caught off, probable mild T2 hyperintensity, and delayed post-contrast hypointensity or hypoenhancement. 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 Potential clinical strategies include focused ultrasound to allow possible sampling, PET, or if patient is not a good biopsy or surgical candidate, follow-up pre and post contrast abdominal MRI at 3 months. 3.  Aortic Atherosclerosis (ICD10-I70.0).  [place]12[place] IMPRESSION: Slight increased echogenicity identified in the area of abnormality on recent MRI and PET-CT although no definitive mass is seen. Some right-sided ductal dilatation is noted similar to that seen on prior MRI.  Past Medical History:  Diagnosis Date  . BPH with obstruction/lower urinary tract symptoms 10/27/2015   Dr. OKarsten Ro . Cataract   . Colon wall thickening 04/2018   "mass-like" per radiologist interpretation; colonoscopy as next step---f/u colonoscopy showed 'tics but o/w normal.  NO further colonoscopies needed due to age.  . Coronary artery disease    with preserved LV function.  . Dementia ([place]   . Diabetes mellitus without complication ([place]   . Diverticulosis of colon    severe, entire colon  . Ectatic abdominal aorta ([place] 01/2017   Abd u/s: 2.9 cm aortic ectasia--at risk for aneurism development.  Recheck aortic u/s 5 yrs.  . Erectile dysfunction due  to arterial insufficiency   . Fatigue   . Gout    always 2nd toe L foot (uric acid 6.20 Dec 2014 per old records)  . Hepatic steatosis   . History of adenomatous polyp of colon 2002  . History of stomach ulcers   . Hyperlipidemia   . Hypertension   . Hypogonadism male   . Klebsiella sepsis  ([place]) 01/2017   due to acute biliary tract infection (no stones) and acute diverticulitis.  . Liver mass 04/2018   GI ref: MRI abd-->?early cholangiocarcinoma-->tissue dx vs PET, vs repeat MRI 3 mo.  GI recommended PET-->results suggestive of cholangiocarcinoma.  Not amenable to bx by IR or GI.  Poss retry by Dr. [name] in GI--pt decl 06/2018.Marland Kitchen  PET by Onc 08/20/18-->focus of activity centrally within liver remains but is slightly less focal and intense, no new liver lesions, no mets.  Repeat PET 02/2019.  . Macrocytic anemia 01/2017   vit B12 borderline low and iron borderline low: checking hemoccults and starting vit B12 PO and iron PO as of 02/11/17.  Vit B12 and folate normal as of GI f/u 06/2018.  . Myogenic ptosis of bilateral eyelids 2018   Plastic surgery in [place], [place] to do surg as of 03/2017.  Marland Kitchen NASH (nonalcoholic steatohepatitis) 06/2017   LFTs up, abd u/s showed fatty liver but no other abnormality.  . Osteoarthritis of right shoulder 09/2018   Near end-stage --->glenohumeral joint-->intra-articular steroid injection 09/2018 (Dr. Delilah Shan).  . Past use of tobacco    quit 1984  . Rectal bleeding   . Rosacea   . S/P coronary artery bypass graft x 3   . Urine incontinence    Dr. Karsten Ro    Current Outpatient Medications  Medication Sig Dispense Refill  . allopurinol (ZYLOPRIM) 100 MG tablet TAKE ONE TABLET BY MOUTH ON MONDAY, WEDNESDAY, AND FRIDAY 36 tablet 3  . ALPRAZolam (NIRAVAM) 0.25 MG dissolvable tablet Take 0.25 mg by mouth at bedtime as needed for anxiety.    Marland Kitchen aspirin 325 MG tablet Take 325 mg by mouth daily.      . ferrous sulfate 325 (65 FE) MG EC tablet Take 325 mg by mouth daily with breakfast.    . finasteride (PROSCAR) 5 MG tablet TAKE ONE TABLET BY MOUTH DAILY 90 tablet 1  . Lancets (ONETOUCH ULTRASOFT) lancets Use to check blood sugar once daily 100 each 12  . lisinopril (PRINIVIL,ZESTRIL) 5 MG tablet TAKE ONE TABLET BY MOUTH DAILY 90 tablet 1  . memantine (NAMENDA) 10  MG tablet Take 1 tablet (10 mg total) 2 (two) times daily by mouth. (Patient taking differently: Take 10 mg by mouth daily. ) 180 tablet 1  . metFORMIN (GLUCOPHAGE-XR) 500 MG 24 hr tablet TAKE ONE TABLET BY MOUTH TWICE DAILY WITH MEALS 180 tablet 1  . metoprolol tartrate (LOPRESSOR) 25 MG tablet TAKE ONE TABLET BY MOUTH TWICE DAILY 60 tablet 5  . MULTIPLE VITAMIN PO Take 1 tablet by mouth daily.     . rosuvastatin (CRESTOR) 10 MG tablet TAKE ONE TABLET BY MOUTH DAILY 90 tablet 1  . tamsulosin (FLOMAX) 0.4 MG CAPS capsule TAKE ONE CAPSULE BY MOUTH DAILY 90 capsule 3  . Trospium Chloride 60 MG CP24 TAKE ONE CAPSULE BY MOUTH DAILY 90 capsule 3  . vitamin B-12 (CYANOCOBALAMIN) 1000 MCG tablet Take 1,000 mcg by mouth daily.    . nitroGLYCERIN (NITROSTAT) 0.4 MG SL tablet Place 1 tablet (0.4 mg total) under the tongue every 5 (five) minutes  as needed for chest pain. 25 tablet 3   No current facility-administered medications for this visit.     Past Surgical History:  Procedure Laterality Date  . CARDIOVASCULAR STRESS TEST  08/28/2016   Low risk myoview, normal EF, no ischemia.  Marland Kitchen CATARACT EXTRACTION, BILATERAL    . COLONOSCOPY  2002, 2006, 02/25/2009; 06/11/18   Polyp x 1 2002, none 2006 or 2010.  Adenomatous polyp 06/11/18+  signif diverticulosis.  NO FURTHER COLONOSCOPIES DUE TO AGE.  Marland Kitchen CORONARY ARTERY BYPASS GRAFT  06/2009   descending,saphenous vein graft to first obtuse marginal, sequential saphenous vein graft to posterior descending and posterolateral  . Endoscopic vein harvest right thigh    . IR RADIOLOGIST EVAL & MGMT  06/12/2018  . IR RADIOLOGIST EVAL & MGMT  09/18/2018  . PTCA    . TONSILLECTOMY    . UMBILICAL HERNIA REPAIR     2009  . VASECTOMY      Family History  Problem Relation Age of Onset  . Cancer Mother   . Cancer Father        pt points to LLQ as  area of cancer, so potentially could have been intestinal.     . Colon cancer Neg Hx   . Esophageal cancer Neg Hx   .  Stomach cancer Neg Hx   . Rectal cancer Neg Hx     Social History   Tobacco Use  . Smoking status: Former Smoker    Packs/day: 1.00    Years: 30.00    Pack years: 30.00    Types: Cigarettes    Last attempt to quit: 03/15/1983    Years since quitting: 35.6  . Smokeless tobacco: Never Used  . Tobacco comment: Quit 1984  Substance Use Topics  . Alcohol use: Yes    Alcohol/week: 5.0 - 7.0 standard drinks    Types: 5 - 7 Glasses of wine per week    Comment: quit etoh 1999 but in ~ 2017 began having a glass of wine with dinner   . Drug use: No    Allergies  Allergen Reactions  . Sulfonamide Derivatives     Unknown, per pt and "cant stand it"     Review of Systems: All systems reviewed and negative except where noted in HPI.    Physical Exam:    Today's Vitals   11/03/18 1438  BP: 116/62  Pulse: 67  Weight: 185 lb 2 oz (84 kg)  Height: 5' 5"  (1.651 m)   Body mass index is 30.81 kg/m.   BP 116/62   Pulse 67   Ht 5' 5"  (1.651 m)   Wt 185 lb 2 oz (84 kg)   BMI 30.81 kg/m  @WEIGHTLAST3 @ GENERAL:  Alert, oriented, cooperative, not in acute distress. PSYCH: :Pleasant, normal mood and affect. HEENT:  conjunctiva pink, mucous membranes moist, neck supple without masses. No jaundice. CARDIAC:  S1 S2 normal. No murmers. PULM: Normal respiratory effort, lungs CTA bilaterally, no wheezing. ABDOMEN: Inspection: No visible peristalsis, no abnormal pulsations, skin normal.  Palpation/percussion: Soft, nontender, nondistended, no rigidity, no abnormal dullness to percussion, no hepatosplenomegaly and no palpable abdominal masses.  Auscultation: Normal bowel sounds, no abdominal bruits. Rectal exam: Deferred SKIN:  turgor, no lesions seen. Musculoskeletal:  Normal muscle tone, normal strength. NEURO: Alert and oriented x 3, no focal neurologic deficits.   Data Reviewed: I have personally reviewed following labs and imaging studies  Hepatic Function Latest Ref Rng & Units  08/28/2018 07/01/2018 05/14/2018  Total Protein  6.0 - 8.3 g/dL 6.5 6.6 6.5  Albumin 3.5 - 5.2 g/dL 4.0 4.1 4.1  AST 0 - 37 U/L 14 15 15   ALT 0 - 53 U/L 12 16 30   Alk Phosphatase 39 - 117 U/L 68 79 93  Total Bilirubin 0.2 - 1.2 mg/dL 0.4 0.5 0.5  Bilirubin, Direct 0.0 - 0.3 mg/dL - - 0.2  25 minutes spent with the patient today. Greater than 50% was spent in counseling and coordination of care with the patient      Jonalyn Sedlak,MD 11/03/2018, 2:48 PM   CC McGowen, Adrian Blackwater, MD

## 2018-11-03 NOTE — Patient Instructions (Signed)
If you are age 81 or older, your body mass index should be between 23-30. Your Body mass index is 30.81 kg/m. If this is out of the aforementioned range listed, please consider follow up with your Primary Care Provider.  If you are age 44 or younger, your body mass index should be between 19-25. Your Body mass index is 30.81 kg/m. If this is out of the aformentioned range listed, please consider follow up with your Primary Care Provider.   We have sent the following medications to your pharmacy for you to pick up at your convenience: Omeprazole 20 mg once daily.   You have been scheduled for a Barium Esophogram at [facility] (1st floor of the hospital) on 11/18/18 at 10:30am. Please arrive 15 minutes prior to your appointment for registration. Make certain not to have anything to eat or drink 3 hours prior to your test. If you need to reschedule for any reason, please contact radiology at [phone] to do so. __________________________________________________________________ A barium swallow is an examination that concentrates on views of the esophagus. This tends to be a double contrast exam (barium and two liquids which, when combined, create a gas to distend the wall of the oesophagus) or single contrast (non-ionic iodine based). The study is usually tailored to your symptoms so a good history is essential. Attention is paid during the study to the form, structure and configuration of the esophagus, looking for functional disorders (such as aspiration, dysphagia, achalasia, motility and reflux) EXAMINATION You may be asked to change into a gown, depending on the type of swallow being performed. A radiologist and radiographer will perform the procedure. The radiologist will advise you of the type of contrast selected for your procedure and direct you during the exam. You will be asked to stand, sit or lie in several different positions and to hold a small amount of fluid in your mouth  before being asked to swallow while the imaging is performed .In some instances you may be asked to swallow barium coated marshmallows to assess the motility of a solid food bolus. The exam can be recorded as a digital or video fluoroscopy procedure. POST PROCEDURE It will take 1-2 days for the barium to pass through your system. To facilitate this, it is important, unless otherwise directed, to increase your fluids for the next 24-48hrs and to resume your normal diet.  This test typically takes about 30 minutes to perform. __________________________________________________________________________________   Thank you,  Dr. Jackquline Denmark

## 2018-11-05 DIAGNOSIS — M19011 Primary osteoarthritis, right shoulder: Secondary | ICD-10-CM | POA: Diagnosis not present

## 2018-11-17 DIAGNOSIS — L602 Onychogryphosis: Secondary | ICD-10-CM | POA: Diagnosis not present

## 2018-11-17 DIAGNOSIS — E1351 Other specified diabetes mellitus with diabetic peripheral angiopathy without gangrene: Secondary | ICD-10-CM | POA: Diagnosis not present

## 2018-11-18 ENCOUNTER — Ambulatory Visit (HOSPITAL_COMMUNITY)
Admission: RE | Admit: 2018-11-18 | Discharge: 2018-11-18 | Disposition: A | Discharge status: Home / Self Care | Payer: Medicare Other | Source: Ambulatory Visit | Attending: Gastroenterology | Admitting: Gastroenterology

## 2018-11-18 DIAGNOSIS — K219 Gastro-esophageal reflux disease without esophagitis: Secondary | ICD-10-CM | POA: Diagnosis not present

## 2018-11-18 DIAGNOSIS — R1310 Dysphagia, unspecified: Secondary | ICD-10-CM

## 2018-11-18 DIAGNOSIS — K449 Diaphragmatic hernia without obstruction or gangrene: Secondary | ICD-10-CM | POA: Diagnosis not present

## 2018-11-19 DIAGNOSIS — C221 Intrahepatic bile duct carcinoma: Secondary | ICD-10-CM

## 2018-11-19 DIAGNOSIS — R1310 Dysphagia, unspecified: Secondary | ICD-10-CM

## 2018-11-19 HISTORY — DX: Intrahepatic bile duct carcinoma: C22.1

## 2018-11-19 HISTORY — DX: Dysphagia, unspecified: R13.10

## 2018-11-25 ENCOUNTER — Encounter: Payer: Self-pay | Admitting: Family Medicine

## 2018-12-02 ENCOUNTER — Encounter: Payer: Self-pay | Admitting: Family Medicine

## 2018-12-09 ENCOUNTER — Encounter: Payer: Self-pay | Admitting: Family Medicine

## 2018-12-15 ENCOUNTER — Other Ambulatory Visit: Payer: Self-pay | Admitting: Family Medicine

## 2018-12-16 ENCOUNTER — Other Ambulatory Visit: Payer: Self-pay | Admitting: Family Medicine

## 2018-12-17 ENCOUNTER — Other Ambulatory Visit: Payer: Self-pay | Admitting: Family Medicine

## 2019-01-20 DIAGNOSIS — L602 Onychogryphosis: Secondary | ICD-10-CM | POA: Diagnosis not present

## 2019-01-20 DIAGNOSIS — E1351 Other specified diabetes mellitus with diabetic peripheral angiopathy without gangrene: Secondary | ICD-10-CM | POA: Diagnosis not present

## 2019-01-26 ENCOUNTER — Other Ambulatory Visit: Payer: Self-pay | Admitting: Family Medicine

## 2019-01-26 ENCOUNTER — Other Ambulatory Visit: Payer: Self-pay | Admitting: Gastroenterology

## 2019-01-26 NOTE — Telephone Encounter (Signed)
RF request for allopruinol LOV: 10/10/18 Next ov: 02/02/19 Last written: 05/06/18 #36 w/ 3RF  Please advise. Thanks.

## 2019-01-27 ENCOUNTER — Other Ambulatory Visit: Payer: Self-pay | Admitting: Family Medicine

## 2019-02-02 ENCOUNTER — Encounter: Payer: Self-pay | Admitting: Family Medicine

## 2019-02-02 ENCOUNTER — Other Ambulatory Visit: Payer: Self-pay

## 2019-02-02 ENCOUNTER — Ambulatory Visit (INDEPENDENT_AMBULATORY_CARE_PROVIDER_SITE_OTHER): Payer: Medicare Other | Admitting: Family Medicine

## 2019-02-02 VITALS — BP 140/80 | HR 72 | Temp 98.0°F | Resp 16 | Ht 65.0 in | Wt 184.2 lb

## 2019-02-02 DIAGNOSIS — E7800 Pure hypercholesterolemia, unspecified: Secondary | ICD-10-CM

## 2019-02-02 DIAGNOSIS — E119 Type 2 diabetes mellitus without complications: Secondary | ICD-10-CM | POA: Diagnosis not present

## 2019-02-02 DIAGNOSIS — G3184 Mild cognitive impairment, so stated: Secondary | ICD-10-CM | POA: Diagnosis not present

## 2019-02-02 DIAGNOSIS — I10 Essential (primary) hypertension: Secondary | ICD-10-CM | POA: Diagnosis not present

## 2019-02-02 LAB — BASIC METABOLIC PANEL
BUN: 15 mg/dL (ref 6–23)
CO2: 27 mEq/L (ref 19–32)
Calcium: 8.6 mg/dL (ref 8.4–10.5)
Chloride: 104 mEq/L (ref 96–112)
Creatinine, Ser: 1.04 mg/dL (ref 0.40–1.50)
GFR: 68.35 mL/min (ref >60.00)
Glucose, Bld: 137 mg/dL — ABNORMAL HIGH (ref 70–99)
Potassium: 4 mEq/L (ref 3.5–5.1)
Sodium: 141 mEq/L (ref 135–145)

## 2019-02-02 LAB — HEMOGLOBIN A1C: Hgb A1c MFr Bld: 6.4 % (ref 4.6–6.5)

## 2019-02-02 NOTE — Progress Notes (Signed)
OFFICE VISIT  02/02/2019   CC:  Chief Complaint  Patient presents with  . Follow-up    RCI, pt is not fasting    HPI:    Patient is a 84 y.o. Caucasian male who presents unaccompanied today for 5 mo f/u DM 2, HTN, HLD with hepatic steatosis. He does have a hx of CAD with preserved LV function. "I am feeling really good".  HTN: no home bp monitoring.  DM: he does not monitor glucose at home.  Compliant with medication.  HLD: takes statin daily w/out side effect.  He gets out and walks but not as part of a formal exercise regimen. Diet: not working on anything in particular.  ROS: no CP, no SOB, no wheezing, no cough, no dizziness, no HAs, no rashes, no melena/hematochezia.  No polyuria or polydipsia.  No myalgias or arthralgias. Memory difficulties unchanged, no help from namenda.   Past Medical History:  Diagnosis Date  . BPH with obstruction/lower urinary tract symptoms 10/27/2015   Dr. Karsten Ro  . Cataract   . Colon wall thickening 04/2018   "mass-like" per radiologist interpretation; colonoscopy as next step---f/u colonoscopy showed 'tics but o/w normal.  NO further colonoscopies needed due to age.  . Coronary artery disease    with preserved LV function.  . Dementia ([place])   . Diabetes mellitus without complication ([place])   . Diverticulosis of colon    severe, entire colon  . Dysphagia 11/2018   Barium swallow: mild esophageal dysmotility.  . Ectatic abdominal aorta ([place]) 01/2017   Abd u/s: 2.9 cm aortic ectasia--at risk for aneurism development.  Recheck aortic u/s 5 yrs.  . Erectile dysfunction due to arterial insufficiency   . Fatigue   . Gout    always 2nd toe L foot (uric acid 6.20 Dec 2014 per old records)  . Hepatic steatosis   . History of adenomatous polyp of colon 2002  . History of stomach ulcers   . Hyperlipidemia   . Hypertension   . Hypogonadism male   . Klebsiella sepsis ([place]) 01/2017   due to acute biliary tract infection (no stones) and  acute diverticulitis.  . Liver mass 04/2018   GI ref: MRI abd-->?early cholangiocarcinoma-->tissue dx vs PET, vs repeat MRI 3 mo.  GI recommended PET-->results suggestive of cholangiocarcinoma.  Not amenable to bx by IR or GI.  Poss retry by Dr. [name] in GI--pt decl 06/2018.Marland Kitchen  PET by Onc 08/20/18-->focus of activity centrally within liver remains but is slightly less focal and intense, no new liver lesions, no mets.  Repeat PET 02/2019.  . Macrocytic anemia 01/2017   vit B12 borderline low and iron borderline low: checking hemoccults and starting vit B12 PO and iron PO as of 02/11/17.  Vit B12 and folate normal as of GI f/u 06/2018.  . Myogenic ptosis of bilateral eyelids 2018   Plastic surgery in [place], [place] to do surg as of 03/2017.  Marland Kitchen NASH (nonalcoholic steatohepatitis) 06/2017   LFTs up, abd u/s showed fatty liver but no other abnormality.  . Osteoarthritis of right shoulder 09/2018   Near end-stage --->glenohumeral joint-->intra-articular steroid injection 09/2018 (Dr. Delilah Shan).  . Osteoarthritis of right shoulder    11/2018-->end stage, but not ready for total shoulder replacement.  . Past use of tobacco    quit 1984  . Rectal bleeding   . Rosacea   . S/P coronary artery bypass graft x 3   . Urine incontinence    Dr. Karsten Ro    Past  Surgical History:  Procedure Laterality Date  . CARDIOVASCULAR STRESS TEST  08/28/2016   Low risk myoview, normal EF, no ischemia.  Marland Kitchen CATARACT EXTRACTION, BILATERAL    . COLONOSCOPY  2002, 2006, 02/25/2009; 06/11/18   Polyp x 1 2002, none 2006 or 2010.  Adenomatous polyp 06/11/18+  signif diverticulosis.  NO FURTHER COLONOSCOPIES DUE TO AGE.  Marland Kitchen CORONARY ARTERY BYPASS GRAFT  06/2009   descending,saphenous vein graft to first obtuse marginal, sequential saphenous vein graft to posterior descending and posterolateral  . Endoscopic vein harvest right thigh    . IR RADIOLOGIST EVAL & MGMT  06/12/2018  . IR RADIOLOGIST EVAL & MGMT  09/18/2018  . PTCA    . TONSILLECTOMY     . UMBILICAL HERNIA REPAIR     2009  . VASECTOMY      Outpatient Medications Prior to Visit  Medication Sig Dispense Refill  . allopurinol (ZYLOPRIM) 100 MG tablet TAKE ONE TABLET BY MOUTH ON MONDAY, WEDNESDAY, AND FRIDAY 36 tablet 3  . ALPRAZolam (NIRAVAM) 0.25 MG dissolvable tablet Take 0.25 mg by mouth at bedtime as needed for anxiety.    Marland Kitchen aspirin 325 MG tablet Take 325 mg by mouth daily.      . betamethasone dipropionate (DIPROLENE) 0.05 % cream apply TO THE AFFECTED AREA nightly. DO not apply TO FACE OR SKIN folds.    . ferrous sulfate 325 (65 FE) MG EC tablet Take 325 mg by mouth daily with breakfast.    . finasteride (PROSCAR) 5 MG tablet TAKE ONE TABLET BY MOUTH DAILY 90 tablet 1  . Lancets (ONETOUCH ULTRASOFT) lancets Use to check blood sugar once daily 100 each 12  . lisinopril (PRINIVIL,ZESTRIL) 5 MG tablet TAKE ONE TABLET BY MOUTH DAILY 90 tablet 1  . metFORMIN (GLUCOPHAGE-XR) 500 MG 24 hr tablet TAKE ONE TABLET BY MOUTH TWICE DAILY WITH MEALS 180 tablet 1  . metoprolol tartrate (LOPRESSOR) 25 MG tablet TAKE ONE TABLET BY MOUTH TWICE DAILY 60 tablet 5  . MULTIPLE VITAMIN PO Take 1 tablet by mouth daily.     Marland Kitchen omeprazole (PRILOSEC) 20 MG capsule TAKE ONE CAPSULE BY MOUTH DAILY 30 capsule 3  . PREVIDENT 5000 PLUS 1.1 % CREA dental cream BRUSH throughly FOR TWO MINUTES AT NIGHT, spit OUT AND DO not eat OR drink FOR 30 MINUTES    . rosuvastatin (CRESTOR) 10 MG tablet TAKE ONE TABLET BY MOUTH DAILY 90 tablet 1  . tamsulosin (FLOMAX) 0.4 MG CAPS capsule TAKE ONE CAPSULE BY MOUTH DAILY 90 capsule 3  . Trospium Chloride 60 MG CP24 TAKE ONE CAPSULE BY MOUTH DAILY 90 capsule 3  . vitamin B-12 (CYANOCOBALAMIN) 1000 MCG tablet Take 1,000 mcg by mouth daily.    . memantine (NAMENDA) 10 MG tablet Take 1 tablet (10 mg total) 2 (two) times daily by mouth. (Patient taking differently: Take 10 mg by mouth daily. ) 180 tablet 1  . nitroGLYCERIN (NITROSTAT) 0.4 MG SL tablet Place 1 tablet (0.4  mg total) under the tongue every 5 (five) minutes as needed for chest pain. 25 tablet 3   No facility-administered medications prior to visit.     Allergies  Allergen Reactions  . Sulfonamide Derivatives     Unknown, per pt and "cant stand it"    ROS As per HPI  PE: Blood pressure 140/80, pulse 72, temperature 98 F (36.7 C), temperature source Oral, resp. rate 16, height 5' 5"  (1.651 m), weight 184 lb 3.2 oz (83.6 kg), SpO2 97 %. Gen:  Alert, well appearing.  Patient is oriented to person, place, and situation. AFFECT: pleasant, lucid thought and speech. No further exam today.  LABS:  Lab Results  Component Value Date   TSH 5.24 (H) 07/10/2018   Lab Results  Component Value Date   WBC 6.0 08/28/2018   HGB 13.3 08/28/2018   HCT 39.5 08/28/2018   MCV 103.5 (H) 08/28/2018   PLT 169.0 08/28/2018   Lab Results  Component Value Date   CREATININE 1.08 08/28/2018   BUN 15 08/28/2018   NA 142 08/28/2018   K 4.2 08/28/2018   CL 107 08/28/2018   CO2 26 08/28/2018   Lab Results  Component Value Date   ALT 12 08/28/2018   AST 14 08/28/2018   ALKPHOS 68 08/28/2018   BILITOT 0.4 08/28/2018   Lab Results  Component Value Date   CHOL 123 01/07/2018   Lab Results  Component Value Date   HDL 52.40 01/07/2018   Lab Results  Component Value Date   LDLCALC 52 01/07/2018   Lab Results  Component Value Date   TRIG 89.0 01/07/2018   Lab Results  Component Value Date   CHOLHDL 2 01/07/2018   Lab Results  Component Value Date   HGBA1C 5.9 (A) 10/10/2018    IMPRESSION AND PLAN:  1) DM 2, not requiring meds. Not eating diabetic diet. HbA1c today. Lytes/cr today.  2) HTN: no home monitoring.  Normal bp here. Lytes/cr today.  3) HLD: tolerating statin.  Not fasting today.  Lipid panel 1 yr ago excellent.  4) Hepatic steatosis: needs to work harder on diet and be more active/lose wt. Last hepatic panel normal 5 mo ago.  Will repeat this in 6 mo.  5) Mild  dementia vs MCI with memory impairment.  His only functional impairment is inability to do his finances. No help from Namenda per pt. Stop namenda for 2 wks and see how memory goes.   Consider trial of exelon patch.  An After Visit Summary was printed and given to the patient.  FOLLOW UP: Return in about 2 weeks (around 02/16/2019) for f/u memory.  Signed:  Crissie Sickles, MD           02/02/2019

## 2019-02-02 NOTE — Patient Instructions (Signed)
STOP you namenda (memantine) for 2 weeks. I'll see you back in office in 2 weeks to see how things are with your memory at that time. We'll consider trial of a different medicine for memory at that time.

## 2019-02-05 ENCOUNTER — Encounter: Payer: Self-pay | Admitting: *Deleted

## 2019-02-17 ENCOUNTER — Encounter: Payer: Self-pay | Admitting: Family Medicine

## 2019-02-17 ENCOUNTER — Other Ambulatory Visit: Payer: Self-pay

## 2019-02-17 ENCOUNTER — Ambulatory Visit (INDEPENDENT_AMBULATORY_CARE_PROVIDER_SITE_OTHER): Payer: Medicare Other | Admitting: Family Medicine

## 2019-02-17 DIAGNOSIS — G3184 Mild cognitive impairment, so stated: Secondary | ICD-10-CM

## 2019-02-17 MED ORDER — RIVASTIGMINE 4.6 MG/24HR TD PT24: 4.6000 mg | MEDICATED_PATCH | Freq: Every day | TRANSDERMAL | 1 refills | Status: DC

## 2019-02-17 NOTE — Progress Notes (Signed)
Virtual Visit via Video Note  I connected with pt on 02/17/19 at 11:15 AM EDT by a video enabled telemedicine application and verified that I am speaking with the correct person using two identifiers.  Location patient: home Location provider:work office Persons participating in the virtual visit: patient, pt's wife Mechele Claude, and myself.   I discussed the limitations of evaluation and management by telemedicine and the availability of in person appointments. The patient expressed understanding and agreed to proceed.   HPI: This is a 2 week f/u visit for memory impairment with little-to-no impairment in function (he is unable to take care of his own finances). Namenda in the past-->no help at max dose for adequate amount of time. At last visit we decided to stop his namenda for 2 weeks and see how his memory goes-->it may have been helping w/out him realizing it, so this will allow [place] to assess that hypothesis. Also discussed consideration of exelon patch last visit.  Interim hx: Pt states he feels great.  Says memory is "better". However, wife WHOLEHEARTEDLY disagrees, says memory is worse. She has to repeat things to him constantly.  He just acts a bit bewildered and cracks jokes about the situation a lot. His wife does agree that he can take care of himself w/out assistance with the exception of his finances.   ROS: See pertinent positives and negatives per HPI.  Past Medical History:  Diagnosis Date  . BPH with obstruction/lower urinary tract symptoms 10/27/2015   Dr. Karsten Ro  . Cataract   . Colon wall thickening 04/2018   "mass-like" per radiologist interpretation; colonoscopy as next step---f/u colonoscopy showed 'tics but o/w normal.  NO further colonoscopies needed due to age.  . Coronary artery disease    with preserved LV function.  . Dementia ([place])   . Diabetes mellitus without complication ([place])   . Diverticulosis of colon    severe, entire colon  . Dysphagia 11/2018   Barium swallow: mild esophageal dysmotility.  . Ectatic abdominal aorta ([place]) 01/2017   Abd u/s: 2.9 cm aortic ectasia--at risk for aneurism development.  Recheck aortic u/s 5 yrs.  . Erectile dysfunction due to arterial insufficiency   . Fatigue   . Gout    always 2nd toe L foot (uric acid 6.20 Dec 2014 per old records)  . Hepatic steatosis   . History of adenomatous polyp of colon 2002  . History of stomach ulcers   . Hyperlipidemia   . Hypertension   . Hypogonadism male   . Klebsiella sepsis ([place]) 01/2017   due to acute biliary tract infection (no stones) and acute diverticulitis.  . Liver mass 04/2018   GI ref: MRI abd-->?early cholangiocarcinoma-->tissue dx vs PET, vs repeat MRI 3 mo.  GI recommended PET-->results suggestive of cholangiocarcinoma.  Not amenable to bx by IR or GI.  Poss retry by Dr. [name] in GI--pt decl 06/2018.Marland Kitchen  PET by Onc 08/20/18-->focus of activity centrally within liver remains but is slightly less focal and intense, no new liver lesions, no mets.  Repeat PET 02/2019.  . Macrocytic anemia 01/2017   vit B12 borderline low and iron borderline low: checking hemoccults and starting vit B12 PO and iron PO as of 02/11/17.  Vit B12 and folate normal as of GI f/u 06/2018.  . Myogenic ptosis of bilateral eyelids 2018   Plastic surgery in [place], [place] to do surg as of 03/2017.  Marland Kitchen NASH (nonalcoholic steatohepatitis) 06/2017   LFTs up, abd u/s showed fatty liver but  no other abnormality.  . Osteoarthritis of right shoulder 09/2018   Near end-stage --->glenohumeral joint-->intra-articular steroid injection 09/2018 (Dr. Delilah Shan).  . Osteoarthritis of right shoulder    11/2018-->end stage, but not ready for total shoulder replacement.  . Past use of tobacco    quit 1984  . Rectal bleeding   . Rosacea   . S/P coronary artery bypass graft x 3   . Urine incontinence    Dr. Karsten Ro    Past Surgical History:  Procedure Laterality Date  . CARDIOVASCULAR STRESS TEST  08/28/2016   Low  risk myoview, normal EF, no ischemia.  Marland Kitchen CATARACT EXTRACTION, BILATERAL    . COLONOSCOPY  2002, 2006, 02/25/2009; 06/11/18   Polyp x 1 2002, none 2006 or 2010.  Adenomatous polyp 06/11/18+  signif diverticulosis.  NO FURTHER COLONOSCOPIES DUE TO AGE.  Marland Kitchen CORONARY ARTERY BYPASS GRAFT  06/2009   descending,saphenous vein graft to first obtuse marginal, sequential saphenous vein graft to posterior descending and posterolateral  . Endoscopic vein harvest right thigh    . IR RADIOLOGIST EVAL & MGMT  06/12/2018  . IR RADIOLOGIST EVAL & MGMT  09/18/2018  . PTCA    . TONSILLECTOMY    . UMBILICAL HERNIA REPAIR     2009  . VASECTOMY      Family History  Problem Relation Age of Onset  . Cancer Mother   . Cancer Father        pt points to LLQ as  area of cancer, so potentially could have been intestinal.     . Colon cancer Neg Hx   . Esophageal cancer Neg Hx   . Stomach cancer Neg Hx   . Rectal cancer Neg Hx       Current Outpatient Medications:  .  allopurinol (ZYLOPRIM) 100 MG tablet, TAKE ONE TABLET BY MOUTH ON MONDAY, WEDNESDAY, AND FRIDAY, Disp: 36 tablet, Rfl: 3 .  ALPRAZolam (NIRAVAM) 0.25 MG dissolvable tablet, Take 0.25 mg by mouth at bedtime as needed for anxiety., Disp: , Rfl:  .  aspirin 325 MG tablet, Take 325 mg by mouth daily.  , Disp: , Rfl:  .  betamethasone dipropionate (DIPROLENE) 0.05 % cream, apply TO THE AFFECTED AREA nightly. DO not apply TO FACE OR SKIN folds., Disp: , Rfl:  .  ferrous sulfate 325 (65 FE) MG EC tablet, Take 325 mg by mouth daily with breakfast., Disp: , Rfl:  .  finasteride (PROSCAR) 5 MG tablet, TAKE ONE TABLET BY MOUTH DAILY, Disp: 90 tablet, Rfl: 1 .  Lancets (ONETOUCH ULTRASOFT) lancets, Use to check blood sugar once daily, Disp: 100 each, Rfl: 12 .  lisinopril (PRINIVIL,ZESTRIL) 5 MG tablet, TAKE ONE TABLET BY MOUTH DAILY, Disp: 90 tablet, Rfl: 1 .  metFORMIN (GLUCOPHAGE-XR) 500 MG 24 hr tablet, TAKE ONE TABLET BY MOUTH TWICE DAILY WITH MEALS, Disp:  180 tablet, Rfl: 1 .  metoprolol tartrate (LOPRESSOR) 25 MG tablet, TAKE ONE TABLET BY MOUTH TWICE DAILY, Disp: 60 tablet, Rfl: 5 .  MULTIPLE VITAMIN PO, Take 1 tablet by mouth daily. , Disp: , Rfl:  .  nitroGLYCERIN (NITROSTAT) 0.4 MG SL tablet, Place 1 tablet (0.4 mg total) under the tongue every 5 (five) minutes as needed for chest pain., Disp: 25 tablet, Rfl: 3 .  omeprazole (PRILOSEC) 20 MG capsule, TAKE ONE CAPSULE BY MOUTH DAILY, Disp: 30 capsule, Rfl: 3 .  PREVIDENT 5000 PLUS 1.1 % CREA dental cream, BRUSH throughly FOR TWO MINUTES AT NIGHT, spit OUT AND DO not  eat OR drink FOR 30 MINUTES, Disp: , Rfl:  .  rosuvastatin (CRESTOR) 10 MG tablet, TAKE ONE TABLET BY MOUTH DAILY, Disp: 90 tablet, Rfl: 1 .  tamsulosin (FLOMAX) 0.4 MG CAPS capsule, TAKE ONE CAPSULE BY MOUTH DAILY, Disp: 90 capsule, Rfl: 3 .  Trospium Chloride 60 MG CP24, TAKE ONE CAPSULE BY MOUTH DAILY, Disp: 90 capsule, Rfl: 3 .  vitamin B-12 (CYANOCOBALAMIN) 1000 MCG tablet, Take 1,000 mcg by mouth daily., Disp: , Rfl:   EXAM:  VITALS per patient if applicable:  GENERAL: alert, oriented, appears well and in no acute distress  HEENT: atraumatic, conjunttiva clear, no obvious abnormalities on inspection of external nose and ears  NECK: normal movements of the head and neck  LUNGS: on inspection no signs of respiratory distress, breathing rate appears normal, no obvious gross SOB, gasping or wheezing  CV: no obvious cyanosis  MS: moves all visible extremities without noticeable abnormality  PSYCH/NEURO: pleasant and cooperative, no obvious depression or anxiety, speech and thought processing grossly intact MMSE today: 21/30.  LABS: none today    Chemistry      Component Value Date/Time   NA 141 02/02/2019 1411   K 4.0 02/02/2019 1411   CL 104 02/02/2019 1411   CO2 27 02/02/2019 1411   BUN 15 02/02/2019 1411   CREATININE 1.04 02/02/2019 1411   CREATININE 1.03 02/08/2017 1631      Component Value Date/Time    CALCIUM 8.6 02/02/2019 1411   ALKPHOS 68 08/28/2018 1407   AST 14 08/28/2018 1407   ALT 12 08/28/2018 1407   BILITOT 0.4 08/28/2018 1407     Lab Results  Component Value Date   WBC 6.0 08/28/2018   HGB 13.3 08/28/2018   HCT 39.5 08/28/2018   MCV 103.5 (H) 08/28/2018   PLT 169.0 08/28/2018    ASSESSMENT AND PLAN:  Discussed the following assessment and plan:  Mild cognitive impairment with memory loss. 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 MMSE today 21/30 on no meds. Namenda of very questionable benefit. Start Exelon 4.26m patch, one patch daily, with plan to possibly double the dose in 1 month.  I discussed the assessment and treatment plan with the patient. The patient was provided an opportunity to ask questions and all were answered. The patient agreed with the plan and demonstrated an understanding of the instructions.   The patient was advised to call back or seek an in-person evaluation if the symptoms worsen or if the condition fails to improve as anticipated.  F/u 1 mo--virtual.  Signed:  PCrissie Sickles MD           02/17/2019

## 2019-03-09 ENCOUNTER — Other Ambulatory Visit: Payer: Self-pay | Admitting: Family Medicine

## 2019-03-09 NOTE — Telephone Encounter (Signed)
RF request for Flomax  LOV: 02/17/2019 for Cognitive impairment  Next ov: Not scheduled  Last written:  02/11/2018    Please advise

## 2019-03-23 ENCOUNTER — Telehealth: Payer: Self-pay | Admitting: Family Medicine

## 2019-03-23 NOTE — Telephone Encounter (Signed)
FYI: [facility] pt's [facility] and confirmed pt still has refills for medication. She informed me pt confessed he has been taking QD instead of M,W, F only. Spoke with pt's wife, Mechele Claude and she was not aware he was to only be taking M,W,F but advised to assist with medication to ensure pt takes correctly. She was made aware pt would have to pay cash b/c insurance does not cover.

## 2019-03-23 NOTE — Telephone Encounter (Signed)
Copied from [place] [id number]. Topic: Quick Communication - Rx Refill/Question >> Mar 23, 2019  9:40 AM Oneta Rack wrote: Medication: 90 day supply allopurinol (ZYLOPRIM) 100 MG tablet   Has the patient contacted their pharmacy? Yes    Preferred Pharmacy (with phone number or street name):  [place], [place] - [address] [phone] (Phone) [phone] (Fax)    Agent: Please be advised that RX refills may take up to 3 business days. We ask that you follow-up with your pharmacy.

## 2019-03-23 NOTE — Telephone Encounter (Signed)
OK, noted.

## 2019-03-24 ENCOUNTER — Other Ambulatory Visit: Payer: Self-pay | Admitting: Family Medicine

## 2019-03-25 NOTE — Telephone Encounter (Signed)
I will deny his exelon patch RF. Needs virtual visit f/u sometime in the next couple of weeks to f/u memory problems and exelon since I just started him on this med about 1 mo ago.-thx

## 2019-03-25 NOTE — Telephone Encounter (Signed)
FYI: Spoke with pt's wife regarding follow-up appt. She will call back to schedule f/u for pt and herself later today.

## 2019-03-25 NOTE — Telephone Encounter (Signed)
Spoke to wife. Scheduled appt with Dr. Genelle Gather on 04/01/19

## 2019-03-25 NOTE — Telephone Encounter (Signed)
RF request for Exelon LOV: 02/17/19 Next ov: Advised to f/u end of April  Last written: 02/17/19 (30,1)  Please advise, thanks. Medication pending

## 2019-04-01 ENCOUNTER — Other Ambulatory Visit: Payer: Self-pay

## 2019-04-01 ENCOUNTER — Encounter: Payer: Self-pay | Admitting: Family Medicine

## 2019-04-01 ENCOUNTER — Ambulatory Visit (INDEPENDENT_AMBULATORY_CARE_PROVIDER_SITE_OTHER): Payer: Medicare Other | Admitting: Family Medicine

## 2019-04-01 VITALS — BP 159/77 | HR 66 | Wt 180.0 lb

## 2019-04-01 DIAGNOSIS — G3184 Mild cognitive impairment, so stated: Secondary | ICD-10-CM | POA: Diagnosis not present

## 2019-04-01 MED ORDER — RIVASTIGMINE 9.5 MG/24HR TD PT24: 9.5000 mg | MEDICATED_PATCH | Freq: Every day | TRANSDERMAL | 1 refills | Status: DC

## 2019-04-01 NOTE — Progress Notes (Signed)
Virtual Visit via Video Note  I connected with pt on 04/01/19 at 10:40 AM EDT by a video enabled telemedicine application and verified that I am speaking with the correct person using two identifiers.  Location patient: home Location provider:work or home office Persons participating in the virtual visit: patient, pt's wife, and myself.  I discussed the limitations of evaluation and management by telemedicine and the availability of in person appointments. The patient expressed understanding and agreed to proceed.  Telemedicine visit is a necessity given the COVID-19 restrictions in place at the current time.  HPI: 83 y/o WM being seen today for 6 wk f/u mild cognitive impairment/memory loss.  His functioning is not impaired except for the fact that he can no longer take care of his own finances (his wife does this for him). He failed namenda trial. Last visit we started him on exelon patch, low dose.  MMSE score last visit was 21/30 while on no med for memory/dementia. He feels no difference on the patch. Wife says no difference. "I feel pretty good.  I get up in the morning and I go to bed at night.  My wife fusses at me". No acute complaints.  He still can drive.  No side effects from meds. Wife says he can't follow 2 step commands when they are complicated.  I did not get to do a MMSE on him today.    Past Medical History:  Diagnosis Date  . BPH with obstruction/lower urinary tract symptoms 10/27/2015   Dr. Karsten Ro  . Cataract   . Colon wall thickening 04/2018   "mass-like" per radiologist interpretation; colonoscopy as next step---f/u colonoscopy showed 'tics but o/w normal.  NO further colonoscopies needed due to age.  . Coronary artery disease    with preserved LV function.  . Dementia ([place])   . Diabetes mellitus without complication ([place])   . Diverticulosis of colon    severe, entire colon  . Dysphagia 11/2018   Barium swallow: mild esophageal dysmotility.  . Ectatic  abdominal aorta ([place]) 01/2017   Abd u/s: 2.9 cm aortic ectasia--at risk for aneurism development.  Recheck aortic u/s 5 yrs.  . Erectile dysfunction due to arterial insufficiency   . Fatigue   . Gout    always 2nd toe L foot (uric acid 6.20 Dec 2014 per old records)  . Hepatic steatosis   . History of adenomatous polyp of colon 2002  . History of stomach ulcers   . Hyperlipidemia   . Hypertension   . Hypogonadism male   . Klebsiella sepsis ([place]) 01/2017   due to acute biliary tract infection (no stones) and acute diverticulitis.  . Liver mass 04/2018   GI ref: MRI abd-->?early cholangiocarcinoma-->tissue dx vs PET, vs repeat MRI 3 mo.  GI recommended PET-->results suggestive of cholangiocarcinoma.  Not amenable to bx by IR or GI.  Poss retry by Dr. [name] in GI--pt decl 06/2018.Marland Kitchen  PET by Onc 08/20/18-->focus of activity centrally within liver remains but is slightly less focal and intense, no new liver lesions, no mets.  Repeat PET 02/2019.  . Macrocytic anemia 01/2017   vit B12 borderline low and iron borderline low: checking hemoccults and starting vit B12 PO and iron PO as of 02/11/17.  Vit B12 and folate normal as of GI f/u 06/2018.  . Myogenic ptosis of bilateral eyelids 2018   Plastic surgery in [place], [place] to do surg as of 03/2017.  Marland Kitchen NASH (nonalcoholic steatohepatitis) 06/2017   LFTs up, abd  u/s showed fatty liver but no other abnormality.  . Osteoarthritis of right shoulder 09/2018   Near end-stage --->glenohumeral joint-->intra-articular steroid injection 09/2018 (Dr. Delilah Shan).  . Osteoarthritis of right shoulder    11/2018-->end stage, but not ready for total shoulder replacement.  . Past use of tobacco    quit 1984  . Rectal bleeding   . Rosacea   . S/P coronary artery bypass graft x 3   . Urine incontinence    Dr. Karsten Ro    Past Surgical History:  Procedure Laterality Date  . CARDIOVASCULAR STRESS TEST  08/28/2016   Low risk myoview, normal EF, no ischemia.  Marland Kitchen CATARACT  EXTRACTION, BILATERAL    . COLONOSCOPY  2002, 2006, 02/25/2009; 06/11/18   Polyp x 1 2002, none 2006 or 2010.  Adenomatous polyp 06/11/18+  signif diverticulosis.  NO FURTHER COLONOSCOPIES DUE TO AGE.  Marland Kitchen CORONARY ARTERY BYPASS GRAFT  06/2009   descending,saphenous vein graft to first obtuse marginal, sequential saphenous vein graft to posterior descending and posterolateral  . Endoscopic vein harvest right thigh    . IR RADIOLOGIST EVAL & MGMT  06/12/2018  . IR RADIOLOGIST EVAL & MGMT  09/18/2018  . PTCA    . TONSILLECTOMY    . UMBILICAL HERNIA REPAIR     2009  . VASECTOMY      Family History  Problem Relation Age of Onset  . Cancer Mother   . Cancer Father        pt points to LLQ as  area of cancer, so potentially could have been intestinal.     . Colon cancer Neg Hx   . Esophageal cancer Neg Hx   . Stomach cancer Neg Hx   . Rectal cancer Neg Hx      Current Outpatient Medications:  .  allopurinol (ZYLOPRIM) 100 MG tablet, TAKE ONE TABLET BY MOUTH ON MONDAY, WEDNESDAY, AND FRIDAY, Disp: 36 tablet, Rfl: 3 .  ALPRAZolam (NIRAVAM) 0.25 MG dissolvable tablet, Take 0.25 mg by mouth at bedtime as needed for anxiety., Disp: , Rfl:  .  aspirin 325 MG tablet, Take 325 mg by mouth daily.  , Disp: , Rfl:  .  betamethasone dipropionate (DIPROLENE) 0.05 % cream, apply TO THE AFFECTED AREA nightly. DO not apply TO FACE OR SKIN folds., Disp: , Rfl:  .  ferrous sulfate 325 (65 FE) MG EC tablet, Take 325 mg by mouth daily with breakfast., Disp: , Rfl:  .  finasteride (PROSCAR) 5 MG tablet, TAKE ONE TABLET BY MOUTH DAILY, Disp: 90 tablet, Rfl: 1 .  Lancets (ONETOUCH ULTRASOFT) lancets, Use to check blood sugar once daily, Disp: 100 each, Rfl: 12 .  lisinopril (PRINIVIL,ZESTRIL) 5 MG tablet, TAKE ONE TABLET BY MOUTH DAILY, Disp: 90 tablet, Rfl: 1 .  metFORMIN (GLUCOPHAGE-XR) 500 MG 24 hr tablet, TAKE ONE TABLET BY MOUTH TWICE DAILY WITH MEALS, Disp: 180 tablet, Rfl: 1 .  metoprolol tartrate (LOPRESSOR)  25 MG tablet, TAKE ONE TABLET BY MOUTH TWICE DAILY, Disp: 60 tablet, Rfl: 5 .  MULTIPLE VITAMIN PO, Take 1 tablet by mouth daily. , Disp: , Rfl:  .  omeprazole (PRILOSEC) 20 MG capsule, TAKE ONE CAPSULE BY MOUTH DAILY, Disp: 30 capsule, Rfl: 3 .  PREVIDENT 5000 PLUS 1.1 % CREA dental cream, BRUSH throughly FOR TWO MINUTES AT NIGHT, spit OUT AND DO not eat OR drink FOR 30 MINUTES, Disp: , Rfl:  .  rosuvastatin (CRESTOR) 10 MG tablet, TAKE ONE TABLET BY MOUTH DAILY, Disp: 90 tablet, Rfl:  1 .  tamsulosin (FLOMAX) 0.4 MG CAPS capsule, TAKE ONE CAPSULE BY MOUTH DAILY, Disp: 90 capsule, Rfl: 3 .  Trospium Chloride 60 MG CP24, TAKE ONE CAPSULE BY MOUTH DAILY, Disp: 90 capsule, Rfl: 3 .  vitamin B-12 (CYANOCOBALAMIN) 1000 MCG tablet, Take 1,000 mcg by mouth daily., Disp: , Rfl:  .  nitroGLYCERIN (NITROSTAT) 0.4 MG SL tablet, Place 1 tablet (0.4 mg total) under the tongue every 5 (five) minutes as needed for chest pain., Disp: 25 tablet, Rfl: 3 .  rivastigmine (EXELON) 9.5 mg/24hr, Place 1 patch (9.5 mg total) onto the skin daily., Disp: 30 patch, Rfl: 1  EXAM:  VITALS per patient if applicable: BP (!) 448/18 (BP Location: Left Arm, Patient Position: Sitting, Cuff Size: Normal)   Pulse 66   Wt 180 lb (81.6 kg)   BMI 29.95 kg/m    GENERAL: alert, oriented, appears well and in no acute distress  HEENT: atraumatic, conjunttiva clear, no obvious abnormalities on inspection of external nose and ears  NECK: normal movements of the head and neck  LUNGS: on inspection no signs of respiratory distress, breathing rate appears normal, no obvious gross SOB, gasping or wheezing  CV: no obvious cyanosis  MS: moves all visible extremities without noticeable abnormality  PSYCH/NEURO: pleasant and cooperative, no obvious depression or anxiety, speech and thought processing grossly intact  LABS: none today    Chemistry      Component Value Date/Time   NA 141 02/02/2019 1411   K 4.0 02/02/2019 1411   CL  104 02/02/2019 1411   CO2 27 02/02/2019 1411   BUN 15 02/02/2019 1411   CREATININE 1.04 02/02/2019 1411   CREATININE 1.03 02/08/2017 1631      Component Value Date/Time   CALCIUM 8.6 02/02/2019 1411   ALKPHOS 68 08/28/2018 1407   AST 14 08/28/2018 1407   ALT 12 08/28/2018 1407   BILITOT 0.4 08/28/2018 1407     Lab Results  Component Value Date   HGBA1C 6.4 02/02/2019   Lab Results  Component Value Date   CHOL 123 01/07/2018   HDL 52.40 01/07/2018   LDLCALC 52 01/07/2018   LDLDIRECT 85.1 06/23/2008   TRIG 89.0 01/07/2018   CHOLHDL 2 01/07/2018   Lab Results  Component Value Date   WBC 6.0 08/28/2018   HGB 13.3 08/28/2018   HCT 39.5 08/28/2018   MCV 103.5 (H) 08/28/2018   PLT 169.0 08/28/2018   Lab Results  Component Value Date   VITAMINB12 886 07/03/2018   Lab Results  Component Value Date   LABURIC 6.1 08/05/2012    ASSESSMENT AND PLAN:  Discussed the following assessment and plan:  Mild cognitive impairment with memory loss. No functional impairment except that he cannot do his finances. He is pleasant.  No adverse effects from exelon, but no help either. Will increase to 9.5 mg patch, 1 patch qd.  I discussed the assessment and treatment plan with the patient. The patient was provided an opportunity to ask questions and all were answered. The patient agreed with the plan and demonstrated an understanding of the instructions.   The patient was advised to call back or seek an in-person evaluation if the symptoms worsen or if the condition fails to improve as anticipated.  F/u: 6 wks : needs A1c, FLP, and CMET at that time.  Signed:  Crissie Sickles, MD           04/01/2019

## 2019-04-27 ENCOUNTER — Other Ambulatory Visit: Payer: Self-pay | Admitting: Family Medicine

## 2019-05-02 ENCOUNTER — Other Ambulatory Visit: Payer: Self-pay | Admitting: Family Medicine

## 2019-05-04 ENCOUNTER — Ambulatory Visit (INDEPENDENT_AMBULATORY_CARE_PROVIDER_SITE_OTHER): Payer: Medicare Other | Admitting: Family Medicine

## 2019-05-04 ENCOUNTER — Encounter: Payer: Self-pay | Admitting: Family Medicine

## 2019-05-04 ENCOUNTER — Other Ambulatory Visit: Payer: Self-pay

## 2019-05-04 ENCOUNTER — Ambulatory Visit: Payer: Self-pay | Admitting: *Deleted

## 2019-05-04 VITALS — Temp 97.9°F | Ht 65.0 in

## 2019-05-04 DIAGNOSIS — R682 Dry mouth, unspecified: Secondary | ICD-10-CM | POA: Diagnosis not present

## 2019-05-04 DIAGNOSIS — K117 Disturbances of salivary secretion: Secondary | ICD-10-CM

## 2019-05-04 NOTE — Progress Notes (Signed)
VIRTUAL VISIT VIA VIDEO  I connected with Ricardo Washington on 05/04/19 at  1:00 PM EDT by a video enabled telemedicine application and verified that I am speaking with the correct person using two identifiers. Location patient: Home Location provider: [facility], Office Persons participating in the virtual visit: Patient, Dr. Raoul Pitch and R.Baker, LPN  I discussed the limitations of evaluation and management by telemedicine and the availability of in person appointments. The patient expressed understanding and agreed to proceed.   SUBJECTIVE Chief Complaint  Patient presents with  . Sore Throat    Pt has had sore throat and dry mouth for a couple of weeks. Pt decided he is not taking any of his medications as of this morning.     HPI: Ricardo Washington is a 84 y.o. male presents today to discuss his sore throat and dry mouth.  He reports since he received "this batch "of medicine from his pharmacy he has had an increase in sore throat and dry mouth last 2 to 3 weeks.  He decided this morning that he was not going to take any more of his medication because he feels they are the cause of his dry mouth.  He denies use of any antihistamines or any meds or dose changes over the last month.  His wife is with him today and states few months ago he said he was able to take any of his medications and he ended up passing out.  She denies any fever, chills, nausea, vomit, cough or shortness of breath.  ROS: See pertinent positives and negatives per HPI.  Patient Active Problem List   Diagnosis Date Noted  . Abnormal CT scan 05/14/2018  . Elevated LFTs 05/14/2018  . Sepsis due to Klebsiella pneumoniae ([place]) 02/04/2017  . Acalculous cholecystitis   . Sepsis, unspecified organism ([place]) 01/31/2017  . Acute cholecystitis 01/31/2017  . Diverticulitis of colon 01/31/2017  . BPH with obstruction/lower urinary tract symptoms 10/27/2015  . Memory loss 02/16/2013  . Routine health maintenance  08/05/2012  . BPH (benign prostatic hyperplasia) 01/06/2010  . CATARACT, HX OF 01/06/2010  . Coronary atherosclerosis 08/07/2009  . TOBACCO USE, QUIT 08/07/2009  . CORONARY ARTERY BYPASS GRAFT, THREE VESSEL, HX OF 08/07/2009  . PERCUTANEOUS TRANSLUMINAL CORONARY ANGIOPLASTY, HX OF 08/07/2009  . Diverticulosis of colon 02/22/2009  . Personal history of colonic polyps 02/22/2009  . AODM 08/24/2008  . Diabetes mellitus, type 2 ([place]) 08/24/2008  . Other and unspecified hyperlipidemia 06/23/2008  . Pure hypercholesterolemia 06/23/2008  . HYPOGONADISM 04/16/2007  . GOUT 04/16/2007  . Essential hypertension 04/16/2007    Social History   Tobacco Use  . Smoking status: Former Smoker    Packs/day: 1.00    Years: 30.00    Pack years: 30.00    Types: Cigarettes    Quit date: 03/15/1983    Years since quitting: 36.1  . Smokeless tobacco: Never Used  . Tobacco comment: Quit 1984  Substance Use Topics  . Alcohol use: Yes    Alcohol/week: 5.0 - 7.0 standard drinks    Types: 5 - 7 Glasses of wine per week    Comment: quit etoh 1999 but in ~ 2017 began having a glass of wine with dinner     Current Outpatient Medications:  .  allopurinol (ZYLOPRIM) 100 MG tablet, TAKE ONE TABLET BY MOUTH ON MONDAY, WEDNESDAY, AND FRIDAY, Disp: 36 tablet, Rfl: 3 .  ALPRAZolam (NIRAVAM) 0.25 MG dissolvable tablet, Take 0.25 mg by mouth at bedtime  as needed for anxiety., Disp: , Rfl:  .  aspirin 325 MG tablet, Take 325 mg by mouth daily.  , Disp: , Rfl:  .  betamethasone dipropionate (DIPROLENE) 0.05 % cream, apply TO THE AFFECTED AREA nightly. DO not apply TO FACE OR SKIN folds., Disp: , Rfl:  .  ferrous sulfate 325 (65 FE) MG EC tablet, Take 325 mg by mouth daily with breakfast., Disp: , Rfl:  .  finasteride (PROSCAR) 5 MG tablet, TAKE ONE TABLET BY MOUTH EVERY DAY, Disp: 90 tablet, Rfl: 0 .  Lancets (ONETOUCH ULTRASOFT) lancets, Use to check blood sugar once daily, Disp: 100 each, Rfl: 12 .  lisinopril  (PRINIVIL,ZESTRIL) 5 MG tablet, TAKE ONE TABLET BY MOUTH DAILY, Disp: 90 tablet, Rfl: 1 .  metFORMIN (GLUCOPHAGE-XR) 500 MG 24 hr tablet, TAKE ONE TABLET BY MOUTH TWICE DAILY WITH MEALS, Disp: 180 tablet, Rfl: 1 .  metoprolol tartrate (LOPRESSOR) 25 MG tablet, TAKE ONE TABLET BY MOUTH TWICE DAILY, Disp: 60 tablet, Rfl: 5 .  MULTIPLE VITAMIN PO, Take 1 tablet by mouth daily. , Disp: , Rfl:  .  omeprazole (PRILOSEC) 20 MG capsule, TAKE ONE CAPSULE BY MOUTH DAILY, Disp: 30 capsule, Rfl: 3 .  PREVIDENT 5000 PLUS 1.1 % CREA dental cream, BRUSH throughly FOR TWO MINUTES AT NIGHT, spit OUT AND DO not eat OR drink FOR 30 MINUTES, Disp: , Rfl:  .  rivastigmine (EXELON) 9.5 mg/24hr, Place 1 patch (9.5 mg total) onto the skin daily., Disp: 30 patch, Rfl: 1 .  rosuvastatin (CRESTOR) 10 MG tablet, TAKE ONE TABLET BY MOUTH EVERY DAY, Disp: 90 tablet, Rfl: 0 .  tamsulosin (FLOMAX) 0.4 MG CAPS capsule, TAKE ONE CAPSULE BY MOUTH DAILY, Disp: 90 capsule, Rfl: 3 .  Trospium Chloride 60 MG CP24, TAKE ONE CAPSULE BY MOUTH DAILY, Disp: 90 capsule, Rfl: 3 .  vitamin B-12 (CYANOCOBALAMIN) 1000 MCG tablet, Take 1,000 mcg by mouth daily., Disp: , Rfl:  .  nitroGLYCERIN (NITROSTAT) 0.4 MG SL tablet, Place 1 tablet (0.4 mg total) under the tongue every 5 (five) minutes as needed for chest pain., Disp: 25 tablet, Rfl: 3  Allergies  Allergen Reactions  . Sulfonamide Derivatives     Unknown, per pt and "cant stand it"    OBJECTIVE: Temp 97.9 F (36.6 C) (Oral)   Ht 5' 5"  (1.651 m)   BMI 29.95 kg/m  Gen: No acute distress. Nontoxic in appearance.  HENT: AT. [place].  MMM.  Eyes:Pupils Equal Round Reactive to light, Extraocular movements intact,  Conjunctiva without redness, discharge or icterus. Chest: Cough or shortness of breath not present Skin: no rashes, purpura or petechiae.  Neuro:  Normal gait. Alert. Oriented x3  Psych: Normal affect, dress and demeanor. Normal speech. Normal thought content and judgment.   ASSESSMENT AND PLAN: Ricardo Washington is a 84 y.o. male present for  Xerostomia -Increase hydration. -Strongly encouraged him to restart his medications. -Discussed with him the only medication on his list that potentially could be causing him to have dry mouth is his Flomax.  However that is prescribed to help him with urination. -He states he will restart his medications today but hold on restarting the Flomax.  Strongly encouraged him to monitor urination and if having any difficulty urinating he needs to be seen immediately by his PCP. Follow-up 1 week as needed   Howard Pouch, DO 05/04/2019

## 2019-05-04 NOTE — Telephone Encounter (Signed)
Pts wife calling initially to report pt "Will not take his meds." States he refuses at times as "He says they make his throat sore and mouth dry."   States c/o sore throat x 1 week. States "He didn't take his meds about a month ago and passed out twice." States was not evaluated for this.  [place] called practice, spoke with Diane for consideration of appt. Pts wife states they have access to fo a virtual appt, email verified.  Reason for Disposition . [1] Sore throat is the only symptom AND [2] present > 48 hours  Answer Assessment - Initial Assessment Questions 1. ONSET: "When did the throat start hurting?" (Hours or days ago)      1 week ago 2. SEVERITY: "How bad is the sore throat?" (Scale 1-10; mild, moderate or severe)   - MILD (1-3):  doesn't interfere with eating or normal activities   - MODERATE (4-7): interferes with eating some solids and normal activities   - SEVERE (8-10):  excruciating pain, interferes with most normal activities   - SEVERE DYSPHAGIA: can't swallow liquids, drooling    varies 3. STREP EXPOSURE: "Has there been any exposure to strep within the past week?" If so, ask: "What type of contact occurred?"      *no 4.  VIRAL SYMPTOMS: "Are there any symptoms of a cold, such as a runny nose, cough, hoarse voice or red eyes?"     Dry mouth 5. FEVER: "Do you have a fever?" If so, ask: "What is your temperature, how was it measured, and when did it start?"    No "Cold all the time" 6. PUS ON THE TONSILS: "Is there pus on the tonsils in the back of your throat?"     Not sure 7. OTHER SYMPTOMS: "Do you have any other symptoms?" (e.g., difficulty breathing, headache, rash)    Dry mouth  Protocols used: SORE THROAT-A-AH

## 2019-05-04 NOTE — Telephone Encounter (Signed)
Pt made virtual visit

## 2019-05-05 ENCOUNTER — Telehealth: Payer: Self-pay | Admitting: *Deleted

## 2019-05-05 NOTE — Telephone Encounter (Signed)
New message      Virtual visit scheduled on 05-12-19 with Cecilie Kicks, NP.  Talked to wife but pt was in the background and gave consent for virtual visit.  Wife will have recent bp reading and wt for nurse. YOUR CARDIOLOGY TEAM HAS ARRANGED FOR AN E-VISIT FOR YOUR APPOINTMENT - PLEASE REVIEW IMPORTANT INFORMATION BELOW SEVERAL DAYS PRIOR TO YOUR APPOINTMENT  Due to the recent COVID-19 pandemic, we are transitioning in-person office visits to tele-medicine visits in an effort to decrease unnecessary exposure to our patients, their families, and staff. These visits are billed to your insurance just like a normal visit is. We also encourage you to sign up for MyChart if you have not already done so. You will need a smartphone if possible. For patients that do not have this, we can still complete the visit using a regular telephone but do prefer a smartphone to enable video when possible. You may have a family member that lives with you that can help. If possible, we also ask that you have a blood pressure cuff and scale at home to measure your blood pressure, heart rate and weight prior to your scheduled appointment. Patients with clinical needs that need an in-person evaluation and testing will still be able to come to the office if absolutely necessary. If you have any questions, feel free to call our office.     YOUR PROVIDER WILL BE USING THE FOLLOWING PLATFORM TO COMPLETE YOUR VISIT:  Doximity   IF USING MYCHART - How to Download the MyChart App to Your SmartPhone   - If Apple, go to [employer] and type in MyChart in the search bar and download the app. If Android, ask patient to go to [employer] and type in [place] in the search bar and download the app. The app is free but as with any other app downloads, your phone may require you to verify saved payment information or Apple/Android password.  - You will need to then log into the app with your MyChart username and password, and select  [facility] as your healthcare provider to link the account.  - When it is time for your visit, go to the MyChart app, find appointments, and click Begin Video Visit. Be sure to Select Allow for your device to access the Microphone and Camera for your visit. You will then be connected, and your provider will be with you shortly.  **If you have any issues connecting or need assistance, please contact MyChart service desk [phone])**  **If using a computer, in order to ensure the best quality for your visit, you will need to use either of the following Internet Browsers: Insurance underwriter or Microsoft Edge**   IF USING DOXIMITY or [email or web address] - The staff will give you instructions on receiving your link to join the meeting the day of your visit.      2-3 DAYS BEFORE YOUR APPOINTMENT  You will receive a telephone call from one of our [facility] team members - your caller ID may say "Unknown caller." If this is a video visit, we will walk you through how to get the video launched on your phone. We will remind you check your blood pressure, heart rate and weight prior to your scheduled appointment. If you have an Apple Watch or Kardia, please upload any pertinent ECG strips the day before or morning of your appointment to [facility]. Our staff will also make sure you have reviewed the consent and agree to move  forward with your scheduled tele-health visit.     THE DAY OF YOUR APPOINTMENT  Approximately 15 minutes prior to your scheduled appointment, you will receive a telephone call from one of [facility] team - your caller ID may say "Unknown caller."  Our staff will confirm medications, vital signs for the day and any symptoms you may be experiencing. Please have this information available prior to the time of visit start. It may also be helpful for you to have a pad of paper and pen handy for any instructions given during your visit. They will also walk you through joining the smartphone  meeting if this is a video visit.    CONSENT FOR TELE-HEALTH VISIT - PLEASE REVIEW  I hereby voluntarily request, consent and authorize [facility] and its employed or contracted physicians, physician assistants, nurse practitioners or other licensed health care professionals (the Practitioner), to provide me with telemedicine health care services (the Services") as deemed necessary by the treating Practitioner. I acknowledge and consent to receive the Services by the Practitioner via telemedicine. I understand that the telemedicine visit will involve communicating with the Practitioner through live audiovisual communication technology and the disclosure of certain medical information by electronic transmission. I acknowledge that I have been given the opportunity to request an in-person assessment or other available alternative prior to the telemedicine visit and am voluntarily participating in the telemedicine visit.  I understand that I have the right to withhold or withdraw my consent to the use of telemedicine in the course of my care at any time, without affecting my right to future care or treatment, and that the Practitioner or I may terminate the telemedicine visit at any time. I understand that I have the right to inspect all information obtained and/or recorded in the course of the telemedicine visit and may receive copies of available information for a reasonable fee.  I understand that some of the potential risks of receiving the Services via telemedicine include:   Delay or interruption in medical evaluation due to technological equipment failure or disruption;  Information transmitted may not be sufficient (e.g. poor resolution of images) to allow for appropriate medical decision making by the Practitioner; and/or   In rare instances, security protocols could fail, causing a breach of personal health information.  Furthermore, I acknowledge that it is my responsibility to provide  information about my medical history, conditions and care that is complete and accurate to the best of my ability. I acknowledge that Practitioner's advice, recommendations, and/or decision may be based on factors not within their control, such as incomplete or inaccurate data provided by me or distortions of diagnostic images or specimens that may result from electronic transmissions. I understand that the practice of medicine is not an exact science and that Practitioner makes no warranties or guarantees regarding treatment outcomes. I acknowledge that I will receive a copy of this consent concurrently upon execution via email to the email address I last provided but may also request a printed copy by calling the office of [facility].    I understand that my insurance will be billed for this visit.   I have read or had this consent read to me.  I understand the contents of this consent, which adequately explains the benefits and risks of the Services being provided via telemedicine.   I have been provided ample opportunity to ask questions regarding this consent and the Services and have had my questions answered to my satisfaction.  I give my  informed consent for the services to be provided through the use of telemedicine in my medical care  By participating in this telemedicine visit I agree to the above.

## 2019-05-06 ENCOUNTER — Telehealth: Payer: Self-pay | Admitting: *Deleted

## 2019-05-06 NOTE — Telephone Encounter (Signed)
Call placed to pt to change virtual office appt to in office.  Pt has answered no to the following pre-covid19 questions below:        COVID-19 Pre-Screening Questions:  . In the past 7 to 10 days have you had a cough,  shortness of breath, headache, congestion, fever (100 or greater) body aches, chills, sore throat, or sudden loss of taste or sense of smell? . Have you been around anyone with known Covid 19. . Have you been around anyone who is awaiting Covid 19 test results in the past 7 to 10 days? . Have you been around anyone who has been exposed to Covid 19, or has mentioned symptoms of Covid 19 within the past 7 to 10 days?  If you have any concerns/questions about symptoms patients report during screening (either on the phone or at [facility]). Contact the provider seeing the patient or DOD for further guidance.  If neither are available contact a member of the leadership team.

## 2019-05-07 ENCOUNTER — Other Ambulatory Visit: Payer: Self-pay

## 2019-05-07 ENCOUNTER — Encounter: Payer: Self-pay | Admitting: Family Medicine

## 2019-05-07 ENCOUNTER — Ambulatory Visit (INDEPENDENT_AMBULATORY_CARE_PROVIDER_SITE_OTHER): Payer: Medicare Other | Admitting: Family Medicine

## 2019-05-07 VITALS — BP 124/59 | HR 67

## 2019-05-07 DIAGNOSIS — K219 Gastro-esophageal reflux disease without esophagitis: Secondary | ICD-10-CM

## 2019-05-07 DIAGNOSIS — J029 Acute pharyngitis, unspecified: Secondary | ICD-10-CM

## 2019-05-07 MED ORDER — OMEPRAZOLE 40 MG PO CPDR: 40.0000 mg | DELAYED_RELEASE_CAPSULE | Freq: Every day | ORAL | 3 refills | Status: DC

## 2019-05-07 NOTE — Patient Instructions (Signed)
Food Choices for Gastroesophageal Reflux Disease, Adult When you have gastroesophageal reflux disease (GERD), the foods you eat and your eating habits are very important. Choosing the right foods can help ease the discomfort of GERD. Consider working with a diet and nutrition specialist (dietitian) to help you make healthy food choices. What general guidelines should I follow?  Eating plan  Choose healthy foods low in fat, such as fruits, vegetables, whole grains, low-fat dairy products, and lean meat, fish, and poultry.  Eat frequent, small meals instead of three large meals each day. Eat your meals slowly, in a relaxed setting. Avoid bending over or lying down until 2-3 hours after eating.  Limit high-fat foods such as fatty meats or fried foods.  Limit your intake of oils, butter, and shortening to less than 8 teaspoons each day.  Avoid the following: ? Foods that cause symptoms. These may be different for different people. Keep a food diary to keep track of foods that cause symptoms. ? Alcohol. ? Drinking large amounts of liquid with meals. ? Eating meals during the 2-3 hours before bed.  Cook foods using methods other than frying. This may include baking, grilling, or broiling. Lifestyle  Maintain a healthy weight. Ask your health care provider what weight is healthy for you. If you need to lose weight, work with your health care provider to do so safely.  Exercise for at least 30 minutes on 5 or more days each week, or as told by your health care provider.  Avoid wearing clothes that fit tightly around your waist and chest.  Do not use any products that contain nicotine or tobacco, such as cigarettes and e-cigarettes. If you need help quitting, ask your health care provider.  Sleep with the head of your bed raised. Use a wedge under the mattress or blocks under the bed frame to raise the head of the bed. What foods are not recommended? The items listed may not be a complete  list. Talk with your dietitian about what dietary choices are best for you. Grains Pastries or quick breads with added fat. [place] toast. Vegetables Deep fried vegetables. [place] fries. Any vegetables prepared with added fat. Any vegetables that cause symptoms. For some people this may include tomatoes and tomato products, chili peppers, onions and garlic, and horseradish. Fruits Any fruits prepared with added fat. Any fruits that cause symptoms. For some people this may include citrus fruits, such as oranges, grapefruit, pineapple, and lemons. Meats and other protein foods High-fat meats, such as fatty beef or pork, hot dogs, ribs, ham, sausage, salami and bacon. Fried meat or protein, including fried fish and fried chicken. Nuts and nut butters. Dairy Whole milk and chocolate milk. Sour cream. Cream. Ice cream. Cream cheese. Milk shakes. Beverages Coffee and tea, with or without caffeine. Carbonated beverages. Sodas. Energy drinks. Fruit juice made with acidic fruits (such as orange or grapefruit). Tomato juice. Alcoholic drinks. Fats and oils Butter. Margarine. Shortening. Ghee. Sweets and desserts Chocolate and cocoa. Donuts. Seasoning and other foods Pepper. Peppermint and spearmint. Any condiments, herbs, or seasonings that cause symptoms. For some people, this may include curry, hot sauce, or vinegar-based salad dressings. Summary  When you have gastroesophageal reflux disease (GERD), food and lifestyle choices are very important to help ease the discomfort of GERD.  Eat frequent, small meals instead of three large meals each day. Eat your meals slowly, in a relaxed setting. Avoid bending over or lying down until 2-3 hours after eating.  Limit high-fat  foods such as fatty meat or fried foods. This information is not intended to replace advice given to you by your health care provider. Make sure you discuss any questions you have with your health care provider. Document Released:  11/05/2005 Document Revised: 11/06/2016 Document Reviewed: 11/06/2016 Elsevier Interactive Patient Education  2019 [employer].

## 2019-05-07 NOTE — Progress Notes (Signed)
Virtual Visit via Video Note  I connected with pt on 05/07/19 at  8:30 AM EDT by telephone b/c a video enabled telemedicine application had technical difficulties--> and verified that I am speaking with the correct person using two identifiers.  Location patient: home Location provider:work or home office Persons participating in the virtual visit: patient, pt's wife, myself.  I discussed the limitations of evaluation and management by telemedicine and the availability of in person appointments. The patient expressed understanding and agreed to proceed.  Telemedicine visit is a necessity given the COVID-19 restrictions in place at the current time.  HPI: 84 y/o WF with whom I am doing a telephone visit today (due to COVID-19 pandemic restrictions) sore throat. Onset a couple weeks ago, feels pain in back of throat.  Bothers more some days than others. Has some dry mouth but this is nothing new for him.  Taking omeprazole 19m qd. No fever, no cough, no HA, no nasal congestion/runny nose.  Denies feeling of any swelling in neck.  He has a hx of dysphagia and documented esophageal dysmotility in 11/2018.  Dr. GLyndel Safewith GI saw him 11/04/19 and dx'd him with GERD with intermittent dysphagia and ordered barium swallow, which was neg for stricture.  No EGD or dilation planned b/c only esoph dysmotility shown on barium esophagram.  ROS: See pertinent positives and negatives per HPI.  Past Medical History:  Diagnosis Date  . BPH with obstruction/lower urinary tract symptoms 10/27/2015   Dr. OKarsten Ro . Cataract   . Colon wall thickening 04/2018   "mass-like" per radiologist interpretation; colonoscopy as next step---f/u colonoscopy showed 'tics but o/w normal.  NO further colonoscopies needed due to age.  . Coronary artery disease    with preserved LV function.  . Dementia ([facility]   . Diabetes mellitus without complication ([facility]   . Diverticulosis of colon    severe, entire colon  . Dysphagia  11/2018   Barium swallow: mild esophageal dysmotility.  . Ectatic abdominal aorta ([facility] 01/2017   Abd u/s: 2.9 cm aortic ectasia--at risk for aneurism development.  Recheck aortic u/s 5 yrs.  . Erectile dysfunction due to arterial insufficiency   . Fatigue   . Gout    always 2nd toe L foot (uric acid 6.20 Dec 2014 per old records)  . Hepatic steatosis   . History of adenomatous polyp of colon 2002  . History of stomach ulcers   . Hyperlipidemia   . Hypertension   . Hypogonadism male   . Klebsiella sepsis ([facility] 01/2017   due to acute biliary tract infection (no stones) and acute diverticulitis.  . Liver mass 04/2018   GI ref: MRI abd-->?early cholangiocarcinoma-->tissue dx vs PET, vs repeat MRI 3 mo.  GI recommended PET-->results suggestive of cholangiocarcinoma.  Not amenable to bx by IR or GI.  Poss retry by Dr. GLyndel Safein GI--pt decl 06/2018..Marland Kitchen PET by Onc 08/20/18-->focus of activity centrally within liver remains but is slightly less focal and intense, no new liver lesions, no mets.  Repeat PET 02/2019.  . Macrocytic anemia 01/2017   vit B12 borderline low and iron borderline low: checking hemoccults and starting vit B12 PO and iron PO as of 02/11/17.  Vit B12 and folate normal as of GI f/u 06/2018.  . Myogenic ptosis of bilateral eyelids 2018   [place].  .Marland KitchenNASH (nonalcoholic steatohepatitis) 06/2017   LFTs up, abd u/s showed fatty liver but no  other abnormality.  . Osteoarthritis of right shoulder 09/2018   Near end-stage --->glenohumeral joint-->intra-articular steroid injection 09/2018 (Dr. Delilah Shan).  . Osteoarthritis of right shoulder    11/2018-->end stage, but not ready for total shoulder replacement.  . Past use of tobacco    quit 1984  . Rectal bleeding   . Rosacea   . S/P coronary artery bypass graft x 3   . Urine incontinence    Dr. Karsten Ro    Past Surgical History:  Procedure Laterality Date  . CARDIOVASCULAR STRESS TEST   08/28/2016   Low risk myoview, normal EF, no ischemia.  Marland Kitchen CATARACT EXTRACTION, BILATERAL    . COLONOSCOPY  2002, 2006, 02/25/2009; 06/11/18   Polyp x 1 2002, none 2006 or 2010.  Adenomatous polyp 06/11/18+  signif diverticulosis.  NO FURTHER COLONOSCOPIES DUE TO AGE.  Marland Kitchen CORONARY ARTERY BYPASS GRAFT  06/2009   descending,saphenous vein graft to first obtuse marginal, sequential saphenous vein graft to posterior descending and posterolateral  . Endoscopic vein harvest right thigh    . IR RADIOLOGIST EVAL & MGMT  06/12/2018  . IR RADIOLOGIST EVAL & MGMT  09/18/2018  . PTCA    . TONSILLECTOMY    . UMBILICAL HERNIA REPAIR     2009  . VASECTOMY      Family History  Problem Relation Age of Onset  . Cancer Mother   . Cancer Father        pt points to LLQ as  area of cancer, so potentially could have been intestinal.     . Colon cancer Neg Hx   . Esophageal cancer Neg Hx   . Stomach cancer Neg Hx   . Rectal cancer Neg Hx     SOCIAL HX:  Social History   Socioeconomic History  . Marital status: Married    Spouse name: Not on file  . Number of children: 3  . Years of education: 40  . Highest education level: Not on file  Occupational History  . Not on file  Social Needs  . Financial resource strain: Not on file  . Food insecurity    Worry: Not on file    Inability: Not on file  . Transportation needs    Medical: Not on file    Non-medical: Not on file  Tobacco Use  . Smoking status: Former Smoker    Packs/day: 1.00    Years: 30.00    Pack years: 30.00    Types: Cigarettes    Quit date: 03/15/1983    Years since quitting: 36.1  . Smokeless tobacco: Never Used  . Tobacco comment: Quit 1984  Substance and Sexual Activity  . Alcohol use: Yes    Alcohol/week: 5.0 - 7.0 standard drinks    Types: 5 - 7 Glasses of wine per week    Comment: quit etoh 1999 but in ~ 2017 began having a glass of wine with dinner   . Drug use: No  . Sexual activity: Never  Lifestyle  . Physical  activity    Days per week: Not on file    Minutes per session: Not on file  . Stress: Not on file  Relationships  . Social Herbalist on phone: Not on file    Gets together: Not on file    Attends religious service: Not on file    Active member of club or organization: Not on file    Attends meetings of clubs or organizations: Not on file  Relationship status: Not on file  Other Topics Concern  . Not on file  Social History Narrative   Married 1957, has 3 sons.   [place]. 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    End of life Care: DNR, no prolonged heroic measures or prolonged supportive care.    Former smoker, quit 1984.   Quit alcohol when dx'd with DM in 2009. ~ 2017 began having glass of wine with dinner.   No exercise.    Current Outpatient Medications:  .  ALPRAZolam (NIRAVAM) 0.25 MG dissolvable tablet, Take 0.25 mg by mouth at bedtime as needed for anxiety., Disp: , Rfl:  .  aspirin 325 MG tablet, Take 325 mg by mouth daily.  , Disp: , Rfl:  .  betamethasone dipropionate (DIPROLENE) 0.05 % cream, apply TO THE AFFECTED AREA nightly. DO not apply TO FACE OR SKIN folds., Disp: , Rfl:  .  ferrous sulfate 325 (65 FE) MG EC tablet, Take 325 mg by mouth daily with breakfast., Disp: , Rfl:  .  finasteride (PROSCAR) 5 MG tablet, TAKE ONE TABLET BY MOUTH EVERY DAY, Disp: 90 tablet, Rfl: 0 .  Lancets (ONETOUCH ULTRASOFT) lancets, Use to check blood sugar once daily, Disp: 100 each, Rfl: 12 .  lisinopril (PRINIVIL,ZESTRIL) 5 MG tablet, TAKE ONE TABLET BY MOUTH DAILY, Disp: 90 tablet, Rfl: 1 .  metFORMIN (GLUCOPHAGE-XR) 500 MG 24 hr tablet, TAKE ONE TABLET BY MOUTH TWICE DAILY WITH MEALS, Disp: 180 tablet, Rfl: 1 .  metoprolol tartrate (LOPRESSOR) 25 MG tablet, TAKE ONE TABLET BY MOUTH TWICE DAILY, Disp: 60 tablet, Rfl: 5 .  MULTIPLE VITAMIN PO, Take 1 tablet by mouth daily. , Disp: , Rfl:  .  omeprazole  (PRILOSEC) 20 MG capsule, TAKE ONE CAPSULE BY MOUTH DAILY, Disp: 30 capsule, Rfl: 3 .  PREVIDENT 5000 PLUS 1.1 % CREA dental cream, BRUSH throughly FOR TWO MINUTES AT NIGHT, spit OUT AND DO not eat OR drink FOR 30 MINUTES, Disp: , Rfl:  .  rivastigmine (EXELON) 9.5 mg/24hr, Place 1 patch (9.5 mg total) onto the skin daily., Disp: 30 patch, Rfl: 1 .  rosuvastatin (CRESTOR) 10 MG tablet, TAKE ONE TABLET BY MOUTH EVERY DAY, Disp: 90 tablet, Rfl: 0 .  tamsulosin (FLOMAX) 0.4 MG CAPS capsule, TAKE ONE CAPSULE BY MOUTH DAILY, Disp: 90 capsule, Rfl: 3 .  Trospium Chloride 60 MG CP24, TAKE ONE CAPSULE BY MOUTH DAILY, Disp: 90 capsule, Rfl: 3 .  vitamin B-12 (CYANOCOBALAMIN) 1000 MCG tablet, Take 1,000 mcg by mouth daily., Disp: , Rfl:  .  allopurinol (ZYLOPRIM) 100 MG tablet, TAKE ONE TABLET BY MOUTH ON MONDAY, WEDNESDAY, AND FRIDAY (Patient not taking: Reported on 05/07/2019), Disp: 36 tablet, Rfl: 3 .  nitroGLYCERIN (NITROSTAT) 0.4 MG SL tablet, Place 1 tablet (0.4 mg total) under the tongue every 5 (five) minutes as needed for chest pain., Disp: 25 tablet, Rfl: 3  EXAM:  VITALS per patient if applicable: BP (!) 594/58   Pulse 67    GENERAL: alert, oriented to person and general time and situation. Wife assists with hx.  He is pretty easily confused as per his baseline.   No further exam b/c telephone encounter. LABS: none today    Chemistry      Component Value Date/Time   NA 141 02/02/2019 1411   K 4.0 02/02/2019 1411   CL 104 02/02/2019 1411   CO2 27 02/02/2019 1411   BUN 15 02/02/2019 1411  CREATININE 1.04 02/02/2019 1411   CREATININE 1.03 02/08/2017 1631      Component Value Date/Time   CALCIUM 8.6 02/02/2019 1411   ALKPHOS 68 08/28/2018 1407   AST 14 08/28/2018 1407   ALT 12 08/28/2018 1407   BILITOT 0.4 08/28/2018 1407     Lab Results  Component Value Date   WBC 6.0 08/28/2018   HGB 13.3 08/28/2018   HCT 39.5 08/28/2018   MCV 103.5 (H) 08/28/2018   PLT 169.0 08/28/2018    Lab Results  Component Value Date   IRON 70 03/08/2017   IRON 76 03/08/2017   TIBC 316 03/08/2017   FERRITIN 55.0 03/08/2017   Lab Results  Component Value Date   VITAMINB12 886 07/03/2018    Lab Results  Component Value Date   HGBA1C 6.4 02/02/2019    ASSESSMENT AND PLAN:  Discussed the following assessment and plan:   Sore Throat, subacute and waxing/waning. Suspected due to uncontrolled LPR. Increase Omeprazole to 40m qd. Will mail him GERD pt info handout to review with his wife. Signs/symptoms to call or return for were reviewed and pt expressed understanding.   I discussed the assessment and treatment plan with the patient. The patient was provided an opportunity to ask questions and all were answered. The patient agreed with the plan and demonstrated an understanding of the instructions.   The patient was advised to call back or seek an in-person evaluation if the symptoms worsen or if the condition fails to improve as anticipated. Spent 15 min with pt today, with >50% of this time spent in counseling and care coordination regarding the above problems.  F/u: 2 wks in office  Signed:  PCrissie Sickles MD           05/07/2019

## 2019-05-08 ENCOUNTER — Other Ambulatory Visit: Payer: Self-pay | Admitting: Interventional Radiology

## 2019-05-08 DIAGNOSIS — R160 Hepatomegaly, not elsewhere classified: Secondary | ICD-10-CM

## 2019-05-11 NOTE — Progress Notes (Signed)
Cardiology Office Note   Date:  05/12/2019   ID:  Ricardo Washington, DOB 23-Apr-1935, MRN [id number]  PCP:  [name], MD  Cardiologist:  Johnsie Cancel    Chief Complaint  Patient presents with  . Coronary Artery Disease      History of Present Illness: Ricardo Washington is a 84 y.o. male who presents for CAD  Pt with hx of CAD and CABG 03/21/11  by Dr Roxan Hockey. He had a lima to lad, svg PDA/PLA and svg OM. His LV functoin is preserved.  Tendency to bradycardia. Beta blocker has been decreased Last myovue October 2017 normal EF 58% no ischemia  Celebrated his 20 th anniversary recently Married his high school sweetheart. Has 3 kids on in [place], [place]  Seen by primary 10/10/18 atypical pain Left sided, random lasts minutes no radiation or associated symptoms has had 2-3 episodes Last month. Has not used nitro ECG in office reviewed and was normal   On last visit patient's pain was in his left abdominal area and side not chest He had no angina with activity  Today no chest pain and no SOB.  He feels well.  No swelling.  He will have PET scan on Monday for liver issues.  Dr. Anitra Lauth follows his dementia.  His wife is with him today.    Past Medical History:  Diagnosis Date  . BPH with obstruction/lower urinary tract symptoms 10/27/2015   Dr. Karsten Ro  . Cataract   . Colon wall thickening 04/2018   "mass-like" per radiologist interpretation; colonoscopy as next step---f/u colonoscopy showed 'tics but o/w normal.  NO further colonoscopies needed due to age.  . Coronary artery disease    with preserved LV function.  . Dementia ([place])   . Diabetes mellitus without complication ([place])   . Diverticulosis of colon    severe, entire colon  . Dysphagia 11/2018   Barium swallow: mild esophageal dysmotility.  . Ectatic abdominal aorta ([place]) 01/2017   Abd u/s: 2.9 cm aortic ectasia--at risk for aneurism development.  Recheck aortic u/s 5 yrs.  . Erectile dysfunction due  to arterial insufficiency   . Fatigue   . Gout    always 2nd toe L foot (uric acid 6.20 Dec 2014 per old records)  . Hepatic steatosis   . History of adenomatous polyp of colon 2002  . History of stomach ulcers   . Hyperlipidemia   . Hypertension   . Hypogonadism male   . Klebsiella sepsis ([place]) 01/2017   due to acute biliary tract infection (no stones) and acute diverticulitis.  . Liver mass 04/2018   GI ref: MRI abd-->?early cholangiocarcinoma-->tissue dx vs PET, vs repeat MRI 3 mo.  GI recommended PET-->results suggestive of cholangiocarcinoma.  Not amenable to bx by IR or GI.  Poss retry by Dr. [name] in GI--pt decl 06/2018.Marland Kitchen  PET by Onc 08/20/18-->focus of activity centrally within liver remains but is slightly less focal and intense, no new liver lesions, no mets.  Repeat PET 02/2019.  . Macrocytic anemia 01/2017   vit B12 borderline low and iron borderline low: checking hemoccults and starting vit B12 PO and iron PO as of 02/11/17.  Vit B12 and folate normal as of GI f/u 06/2018.  . Myogenic ptosis of bilateral eyelids 2018   Plastic surgery in [place], [place] to do surg as of 03/2017.  Marland Kitchen NASH (nonalcoholic steatohepatitis) 06/2017   LFTs up, abd u/s showed fatty liver but no other abnormality.  Marland Kitchen  Osteoarthritis of right shoulder 09/2018   Near end-stage --->glenohumeral joint-->intra-articular steroid injection 09/2018 (Dr. Delilah Shan).  . Osteoarthritis of right shoulder    11/2018-->end stage, but not ready for total shoulder replacement.  . Past use of tobacco    quit 1984  . Rectal bleeding   . Rosacea   . S/P coronary artery bypass graft x 3   . Urine incontinence    Dr. Karsten Ro    Past Surgical History:  Procedure Laterality Date  . CARDIOVASCULAR STRESS TEST  08/28/2016   Low risk myoview, normal EF, no ischemia.  Marland Kitchen CATARACT EXTRACTION, BILATERAL    . COLONOSCOPY  2002, 2006, 02/25/2009; 06/11/18   Polyp x 1 2002, none 2006 or 2010.  Adenomatous polyp 06/11/18+  signif diverticulosis.   NO FURTHER COLONOSCOPIES DUE TO AGE.  Marland Kitchen CORONARY ARTERY BYPASS GRAFT  06/2009   descending,saphenous vein graft to first obtuse marginal, sequential saphenous vein graft to posterior descending and posterolateral  . Endoscopic vein harvest right thigh    . IR RADIOLOGIST EVAL & MGMT  06/12/2018  . IR RADIOLOGIST EVAL & MGMT  09/18/2018  . PTCA    . TONSILLECTOMY    . UMBILICAL HERNIA REPAIR     2009  . VASECTOMY       Current Outpatient Medications  Medication Sig Dispense Refill  . allopurinol (ZYLOPRIM) 100 MG tablet TAKE ONE TABLET BY MOUTH ON MONDAY, WEDNESDAY, AND FRIDAY (Patient not taking: Reported on 05/07/2019) 36 tablet 3  . ALPRAZolam (NIRAVAM) 0.25 MG dissolvable tablet Take 0.25 mg by mouth at bedtime as needed for anxiety.    Marland Kitchen aspirin 325 MG tablet Take 325 mg by mouth daily.      . betamethasone dipropionate (DIPROLENE) 0.05 % cream apply TO THE AFFECTED AREA nightly. DO not apply TO FACE OR SKIN folds.    . ferrous sulfate 325 (65 FE) MG EC tablet Take 325 mg by mouth daily with breakfast.    . finasteride (PROSCAR) 5 MG tablet TAKE ONE TABLET BY MOUTH EVERY DAY 90 tablet 0  . Lancets (ONETOUCH ULTRASOFT) lancets Use to check blood sugar once daily 100 each 12  . lisinopril (PRINIVIL,ZESTRIL) 5 MG tablet TAKE ONE TABLET BY MOUTH DAILY 90 tablet 1  . metFORMIN (GLUCOPHAGE-XR) 500 MG 24 hr tablet TAKE ONE TABLET BY MOUTH TWICE DAILY WITH MEALS 180 tablet 1  . metoprolol tartrate (LOPRESSOR) 25 MG tablet TAKE ONE TABLET BY MOUTH TWICE DAILY 60 tablet 5  . MULTIPLE VITAMIN PO Take 1 tablet by mouth daily.     . nitroGLYCERIN (NITROSTAT) 0.4 MG SL tablet Place 1 tablet (0.4 mg total) under the tongue every 5 (five) minutes as needed for chest pain. 25 tablet 3  . omeprazole (PRILOSEC) 40 MG capsule Take 1 capsule (40 mg total) by mouth daily. 30 capsule 3  . PREVIDENT 5000 PLUS 1.1 % CREA dental cream BRUSH throughly FOR TWO MINUTES AT NIGHT, spit OUT AND DO not eat OR drink  FOR 30 MINUTES    . rivastigmine (EXELON) 9.5 mg/24hr Place 1 patch (9.5 mg total) onto the skin daily. 30 patch 1  . rosuvastatin (CRESTOR) 10 MG tablet TAKE ONE TABLET BY MOUTH EVERY DAY 90 tablet 0  . tamsulosin (FLOMAX) 0.4 MG CAPS capsule TAKE ONE CAPSULE BY MOUTH DAILY 90 capsule 3  . Trospium Chloride 60 MG CP24 TAKE ONE CAPSULE BY MOUTH DAILY 90 capsule 3  . vitamin B-12 (CYANOCOBALAMIN) 1000 MCG tablet Take 1,000 mcg by mouth daily.  No current facility-administered medications for this visit.     Allergies:   Sulfonamide derivatives    Social History:  The patient  reports that he quit smoking about 36 years ago. His smoking use included cigarettes. He has a 30.00 pack-year smoking history. He has never used smokeless tobacco. He reports current alcohol use of about 5.0 - 7.0 standard drinks of alcohol per week. He reports that he does not use drugs.   Family History:  The patient's family history includes Cancer in his father and mother.    ROS:  General:no colds or fevers, no weight changes Skin:no rashes or ulcers HEENT:no blurred vision, no congestion CV:see HPI PUL:see HPI GI:no diarrhea constipation or melena, no indigestion GU:no hematuria, no dysuria MS:no joint pain, no claudication Neuro:no syncope, no lightheadedness Endo:+ diabetes, no thyroid disease  Wt Readings from Last 3 Encounters:  05/12/19 173 lb (78.5 kg)  04/01/19 180 lb (81.6 kg)  02/02/19 184 lb 3.2 oz (83.6 kg)     PHYSICAL EXAM: VS:  BP 126/66   Pulse 61   Ht 5' 5"  (1.651 m)   Wt 173 lb (78.5 kg)   BMI 28.79 kg/m  , BMI Body mass index is 28.79 kg/m. General:Pleasant affect, NAD Skin:Warm and dry, brisk capillary refill HEENT:normocephalic, sclera clear, mucus membranes moist Neck:supple, no JVD, no bruits  Heart:S1S2 RRR without murmur, gallup, rub or click Lungs:clear without rales, rhonchi, or wheezes WJX:BJYN, non tender, + BS, do not palpate liver spleen or masses Ext:no  lower ext edema, 2+ pedal pulses, 2+ radial pulses Neuro:alert and oriented today with memory deficits, MAE, follows commands, + facial symmetry    EKG:  EKG is ordered today. The ekg ordered today demonstrates SR LVH, Q waves in inf. Leads, no acute ST changes from prior EKG   Recent Labs: 07/10/2018: TSH 5.24 08/28/2018: ALT 12; Hemoglobin 13.3; Platelets 169.0 02/02/2019: BUN 15; Creatinine, Ser 1.04; Potassium 4.0; Sodium 141    Lipid Panel    Component Value Date/Time   CHOL 123 01/07/2018 1001   TRIG 89.0 01/07/2018 1001   HDL 52.40 01/07/2018 1001   CHOLHDL 2 01/07/2018 1001   VLDL 17.8 01/07/2018 1001   LDLCALC 52 01/07/2018 1001   LDLDIRECT 85.1 06/23/2008 1057       Other studies Reviewed: Additional studies/ records that were reviewed today include: . Stress test 2017 Study Highlights    Nuclear stress EF: 58%.  There was no ST segment deviation noted during stress.  Defect 1: There is a small defect of moderate severity present in the basal inferolateral location.  Defect 2: There is a small defect of moderate severity present in the apex location.   Low risk stress nuclear study with apical thinning and small infarct vs attenuation in the basal inferolateral wall; no ischemia; EF 58 with normal wall motion.      ASSESSMENT AND PLAN:  1.  CAD with hx CABVG in 2012, no angina continue medical therapy 2.  HTN controlled, no changes in meds.  3.  HLD will recheck today. 4.  Bradycardia resolved.   Follow up in 6 months with Dr. Johnsie Cancel    Current medicines are reviewed with the patient today.  The patient Has no concerns regarding medicines.  The following changes have been made:  See above Labs/ tests ordered today include:see above  Disposition:   FU:  see above  Signed, Cecilie Kicks, NP  05/12/2019 4:33 PM    [facility]  [place], [place], [place]  Phone: [phone]; Fax: [phone]

## 2019-05-12 ENCOUNTER — Other Ambulatory Visit: Payer: Self-pay

## 2019-05-12 ENCOUNTER — Encounter: Payer: Self-pay | Admitting: Cardiology

## 2019-05-12 ENCOUNTER — Ambulatory Visit (INDEPENDENT_AMBULATORY_CARE_PROVIDER_SITE_OTHER): Payer: Medicare Other | Admitting: Cardiology

## 2019-05-12 VITALS — BP 126/66 | HR 61 | Ht 65.0 in | Wt 173.0 lb

## 2019-05-12 DIAGNOSIS — Z951 Presence of aortocoronary bypass graft: Secondary | ICD-10-CM | POA: Diagnosis not present

## 2019-05-12 DIAGNOSIS — E7800 Pure hypercholesterolemia, unspecified: Secondary | ICD-10-CM | POA: Diagnosis not present

## 2019-05-12 DIAGNOSIS — I10 Essential (primary) hypertension: Secondary | ICD-10-CM

## 2019-05-12 NOTE — Patient Instructions (Signed)
Medication Instructions: Your physician recommends that you continue on your current medications as directed. Please refer to the Current Medication list given to you today.   If you need a refill on your cardiac medications before your next appointment, please call your pharmacy.   Lab work: NONE ORDERED  TODAY   If you have labs (blood work) drawn today and your tests are completely normal, you will receive your results only by: Marland Kitchen MyChart Message (if you have MyChart) OR . A paper copy in the mail If you have any lab test that is abnormal or we need to change your treatment, we will call you to review the results.  Testing/Procedures:   Follow-Up: At [facility], you and your health needs are our priority.  As part of our continuing mission to provide you with exceptional heart care, we have created designated Provider Care Teams.  These Care Teams include your primary Cardiologist (physician) and Advanced Practice Providers (APPs -  Physician Assistants and Nurse Practitioners) who all work together to provide you with the care you need, when you need it. You will need a follow up appointment in 6 months.  Please call our office 2 months in advance to schedule this appointment.  You may see Jenkins Rouge, MD or one of the following Advanced Practice Providers on your designated Care Team:   Truitt Merle, NP Cecilie Kicks, NP . Kathyrn Drown, NP  Any Other Special Instructions Will Be Listed Below (If Applicable).

## 2019-05-18 ENCOUNTER — Encounter (HOSPITAL_COMMUNITY)
Admission: RE | Admit: 2019-05-18 | Discharge: 2019-05-18 | Disposition: A | Discharge status: Home / Self Care | Payer: Medicare Other | Source: Ambulatory Visit | Attending: Interventional Radiology | Admitting: Interventional Radiology

## 2019-05-18 ENCOUNTER — Other Ambulatory Visit: Payer: Self-pay

## 2019-05-18 DIAGNOSIS — C19 Malignant neoplasm of rectosigmoid junction: Secondary | ICD-10-CM | POA: Diagnosis not present

## 2019-05-18 DIAGNOSIS — K7689 Other specified diseases of liver: Secondary | ICD-10-CM | POA: Diagnosis not present

## 2019-05-18 DIAGNOSIS — R160 Hepatomegaly, not elsewhere classified: Secondary | ICD-10-CM

## 2019-05-18 LAB — GLUCOSE, CAPILLARY: Glucose-Capillary: 90 mg/dL (ref 70–99)

## 2019-05-18 MED ORDER — FLUDEOXYGLUCOSE F - 18 (FDG) INJECTION
8.7000 | Freq: Once | INTRAVENOUS | Status: AC | PRN
  Administered 2019-05-18: 8.7 via INTRAVENOUS

## 2019-05-21 ENCOUNTER — Encounter: Payer: Self-pay | Admitting: Family Medicine

## 2019-05-21 ENCOUNTER — Ambulatory Visit (INDEPENDENT_AMBULATORY_CARE_PROVIDER_SITE_OTHER): Payer: Medicare Other | Admitting: Family Medicine

## 2019-05-21 ENCOUNTER — Other Ambulatory Visit: Payer: Self-pay

## 2019-05-21 VITALS — BP 118/70 | HR 73 | Temp 98.3°F | Resp 16 | Ht 65.0 in | Wt 170.6 lb

## 2019-05-21 DIAGNOSIS — I10 Essential (primary) hypertension: Secondary | ICD-10-CM | POA: Diagnosis not present

## 2019-05-21 DIAGNOSIS — E119 Type 2 diabetes mellitus without complications: Secondary | ICD-10-CM

## 2019-05-21 DIAGNOSIS — I2510 Atherosclerotic heart disease of native coronary artery without angina pectoris: Secondary | ICD-10-CM

## 2019-05-21 NOTE — Progress Notes (Signed)
OFFICE VISIT  05/21/2019   CC:  Chief Complaint  Patient presents with  . Follow-up   HPI:    Patient is a 84 y.o. Caucasian male who presents accompanied by his wife for 6 wk f/u memory problems/mild cognitive impairment, probably early alzheimer's vs vascular dementia. Besides his memory problems he is remaining functional except he is unable to take care of his finances.  He can't follow 2 step commands quite like he used to. Last visit he was tolerating exelon patch but showed no improvement so we increased the dose (9.5 mg/24h, max dose is 13.3 mg/hr).  Also, 2-3 wks ago I dx'd him with LPR and increased his omeprazole to 70m qd.  Interim hx: No change.  No new sx's.  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  He denies depression.  Since I last saw him he got another PET scan for his question of biliary neoplasm.  He has f/u with his specialist early next week (Dr. SAnnamaria Boots.  Past Medical History:  Diagnosis Date  . BPH with obstruction/lower urinary tract symptoms 10/27/2015   Dr. OKarsten Ro . Cataract   . Colon wall thickening 04/2018   "mass-like" per radiologist interpretation; colonoscopy as next step---f/u colonoscopy showed 'tics but o/w normal.  NO further colonoscopies needed due to age.  . Coronary artery disease    with preserved LV function.  . Dementia ([place]   . Diabetes mellitus without complication ([place]   . Diverticulosis of colon    severe, entire colon  . Dysphagia 11/2018   Barium swallow: mild esophageal dysmotility.  . Ectatic [place] aorta [place] 01/2017   Abd u/s: 2.9 cm aortic ectasia--at risk for aneurism development.  Recheck aortic u/s 5 yrs.  . Erectile dysfunction due to arterial insufficiency   . Fatigue   . Gout    always 2nd toe L foot (uric acid 6.20 Dec 2014 per old records)  . Hepatic steatosis   . History of adenomatous polyp of colon 2002  . History of stomach ulcers   . Hyperlipidemia   . Hypertension   .  Hypogonadism male   . Klebsiella sepsis ([place] 01/2017   due to acute biliary tract infection (no stones) and acute diverticulitis.  . Liver mass 04/2018   GI ref: MRI abd-->?early cholangiocarcinoma-->tissue dx vs PET, vs repeat MRI 3 mo.  GI recommended PET-->results suggestive of cholangiocarcinoma.  Not amenable to bx by IR or GI.  Poss retry by Dr. GLyndel Safein GI--pt decl 06/2018..Marland Kitchen PET by Onc 08/20/18-->focus of activity centrally within liver remains but is slightly less focal and intense, no new liver lesions, no mets.  Repeat PET 02/2019.  . Macrocytic anemia 01/2017   vit B12 borderline low and iron borderline low: checking hemoccults and starting vit B12 PO and iron PO as of 02/11/17.  Vit B12 and folate normal as of GI f/u 06/2018.  . Myogenic ptosis of bilateral eyelids 2018   [place]  .Marland KitchenNASH (nonalcoholic steatohepatitis) 06/2017   LFTs up, abd u/s showed fatty liver but no other abnormality.  . Osteoarthritis of right shoulder 09/2018   Near end-stage --->glenohumeral joint-->intra-articular steroid injection 09/2018 (Dr. KDelilah Shan.  . Osteoarthritis of right shoulder    11/2018-->end stage, but not ready for total shoulder replacement.  . Past use of tobacco    quit 1984  . Rectal bleeding   . Rosacea   .  S/P coronary artery bypass graft x 3   . Urine incontinence    Dr. Karsten Ro    Past Surgical History:  Procedure Laterality Date  . CARDIOVASCULAR STRESS TEST  08/28/2016   Low risk myoview, normal EF, no ischemia.  Marland Kitchen CATARACT EXTRACTION, BILATERAL    . COLONOSCOPY  2002, 2006, 02/25/2009; 06/11/18   Polyp x 1 2002, none 2006 or 2010.  Adenomatous polyp 06/11/18+  signif diverticulosis.  NO FURTHER COLONOSCOPIES DUE TO AGE.  Marland Kitchen CORONARY ARTERY BYPASS GRAFT  06/2009   descending,saphenous vein graft to first obtuse marginal, sequential saphenous vein graft to posterior descending and posterolateral  . Endoscopic vein harvest right thigh     . IR RADIOLOGIST EVAL & MGMT  06/12/2018  . IR RADIOLOGIST EVAL & MGMT  09/18/2018  . PTCA    . TONSILLECTOMY    . UMBILICAL HERNIA REPAIR     2009  . VASECTOMY      Outpatient Medications Prior to Visit  Medication Sig Dispense Refill  . allopurinol (ZYLOPRIM) 100 MG tablet TAKE ONE TABLET BY MOUTH ON MONDAY, WEDNESDAY, AND FRIDAY 36 tablet 3  . ALPRAZolam (NIRAVAM) 0.25 MG dissolvable tablet Take 0.25 mg by mouth at bedtime as needed for anxiety.    Marland Kitchen aspirin 325 MG tablet Take 325 mg by mouth daily.      . betamethasone dipropionate (DIPROLENE) 0.05 % cream apply TO THE AFFECTED AREA nightly. DO not apply TO FACE OR SKIN folds.    . ferrous sulfate 325 (65 FE) MG EC tablet Take 325 mg by mouth daily with breakfast.    . finasteride (PROSCAR) 5 MG tablet TAKE ONE TABLET BY MOUTH EVERY DAY 90 tablet 0  . Lancets (ONETOUCH ULTRASOFT) lancets Use to check blood sugar once daily 100 each 12  . lisinopril (PRINIVIL,ZESTRIL) 5 MG tablet TAKE ONE TABLET BY MOUTH DAILY 90 tablet 1  . metFORMIN (GLUCOPHAGE-XR) 500 MG 24 hr tablet TAKE ONE TABLET BY MOUTH TWICE DAILY WITH MEALS 180 tablet 1  . metoprolol tartrate (LOPRESSOR) 25 MG tablet TAKE ONE TABLET BY MOUTH TWICE DAILY 60 tablet 5  . MULTIPLE VITAMIN PO Take 1 tablet by mouth daily.     Marland Kitchen omeprazole (PRILOSEC) 40 MG capsule Take 1 capsule (40 mg total) by mouth daily. 30 capsule 3  . PREVIDENT 5000 PLUS 1.1 % CREA dental cream BRUSH throughly FOR TWO MINUTES AT NIGHT, spit OUT AND DO not eat OR drink FOR 30 MINUTES    . rosuvastatin (CRESTOR) 10 MG tablet TAKE ONE TABLET BY MOUTH EVERY DAY 90 tablet 0  . tamsulosin (FLOMAX) 0.4 MG CAPS capsule TAKE ONE CAPSULE BY MOUTH DAILY 90 capsule 3  . Trospium Chloride 60 MG CP24 TAKE ONE CAPSULE BY MOUTH DAILY 90 capsule 3  . vitamin B-12 (CYANOCOBALAMIN) 1000 MCG tablet Take 1,000 mcg by mouth daily.    . rivastigmine (EXELON) 9.5 mg/24hr Place 1 patch (9.5 mg total) onto the skin daily. 30 patch 1   . nitroGLYCERIN (NITROSTAT) 0.4 MG SL tablet Place 1 tablet (0.4 mg total) under the tongue every 5 (five) minutes as needed for chest pain. (Patient not taking: Reported on 05/21/2019) 25 tablet 3   No facility-administered medications prior to visit.     Allergies  Allergen Reactions  . Sulfonamide Derivatives     Unknown, per pt and "cant stand it"    ROS As per HPI  PE: Blood pressure 118/70, pulse 73, temperature 98.3 F (36.8 C), temperature source Temporal,  resp. rate 16, height 5' 5"  (1.651 m), weight 170 lb 9.6 oz (77.4 kg), SpO2 96 %. Gen: Alert, well appearing.  Patient is oriented to person, place, time, and situation. Pleasant affect.    LABS:    Chemistry      Component Value Date/Time   NA 141 02/02/2019 1411   K 4.0 02/02/2019 1411   CL 104 02/02/2019 1411   CO2 27 02/02/2019 1411   BUN 15 02/02/2019 1411   CREATININE 1.04 02/02/2019 1411   CREATININE 1.03 02/08/2017 1631      Component Value Date/Time   CALCIUM 8.6 02/02/2019 1411   ALKPHOS 68 08/28/2018 1407   AST 14 08/28/2018 1407   ALT 12 08/28/2018 1407   BILITOT 0.4 08/28/2018 1407     Lab Results  Component Value Date   WBC 6.0 08/28/2018   HGB 13.3 08/28/2018   HCT 39.5 08/28/2018   MCV 103.5 (H) 08/28/2018   PLT 169.0 08/28/2018   Lab Results  Component Value Date   TSH 5.24 (H) 07/10/2018   Lab Results  Component Value Date   VITAMINB12 886 07/03/2018   Lab Results  Component Value Date   CHOL 123 01/07/2018   HDL 52.40 01/07/2018   LDLCALC 52 01/07/2018   LDLDIRECT 85.1 06/23/2008   TRIG 89.0 01/07/2018   CHOLHDL 2 01/07/2018   Lab Results  Component Value Date   HGBA1C 6.4 02/02/2019     IMPRESSION AND PLAN:  1) Dementia vs memory impairment with mild cognitive impairment (he has minimal functional impairment). No help from aricept, namenda, or exelon. I told them I would ask one of our neurologist's if there were any research studies regarding dementia treatment  that he may qualify for.  2) DM 2: due for A1c--ordered future. Will do feet exam at next f/u visit.  3) CAD: due for lipid panel. FLP and CMET ordered --future.  An After Visit Summary was printed and given to the patient.  FOLLOW UP: Return in about 4 months (around 09/21/2019) for routine chronic illness f/u.  Signed:  Crissie Sickles, MD           05/21/2019

## 2019-05-26 ENCOUNTER — Ambulatory Visit
Admission: RE | Admit: 2019-05-26 | Discharge: 2019-05-26 | Disposition: A | Discharge status: Home / Self Care | Payer: Medicare Other | Source: Ambulatory Visit | Attending: Interventional Radiology | Admitting: Interventional Radiology

## 2019-05-26 ENCOUNTER — Encounter: Payer: Self-pay | Admitting: *Deleted

## 2019-05-26 ENCOUNTER — Other Ambulatory Visit: Payer: Self-pay

## 2019-05-26 DIAGNOSIS — K7689 Other specified diseases of liver: Secondary | ICD-10-CM | POA: Diagnosis not present

## 2019-05-26 DIAGNOSIS — R160 Hepatomegaly, not elsewhere classified: Secondary | ICD-10-CM

## 2019-05-26 HISTORY — PX: IR RADIOLOGIST EVAL & MGMT: IMG5224

## 2019-05-26 NOTE — Progress Notes (Signed)
Patient ID: Ricardo Washington, male   DOB: [birth year], 84 y.o.   MRN: [id number]       Chief Complaint:  Indeterminant small central liver abnormality/mass with segmental biliary dilatation, concern for cholangiocarcinoma.  Referring Physician(s): [name]  History of Present Illness: Ricardo Washington is a 84 y.o. 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  He is followed closely by GI, Dr. [name].  He has been undergoing surveillance imaging of an indeterminate central hepatic hilar mass with segmental intrahepatic biliary dilatation (segment 8).  He returns to review surveillance imaging.  He remains asymptomatic.  No abdominal pain or flank pain.  No signs of liver failure or jaundice.  No fevers.  He has had a repeat PET scan in the interval.  Past Medical History:  Diagnosis Date   BPH with obstruction/lower urinary tract symptoms 10/27/2015   Dr. Karsten Ro   Cataract    Colon wall thickening 04/2018   "mass-like" per radiologist interpretation; colonoscopy as next step---f/u colonoscopy showed 'tics but o/w normal.  NO further colonoscopies needed due to age.   Coronary artery disease    with preserved LV function.   Dementia ([place])    Diabetes mellitus without complication ([place])    Diverticulosis of colon    severe, entire colon   Dysphagia 11/2018   Barium swallow: mild esophageal dysmotility.   Ectatic abdominal aorta ([place]) 01/2017   Abd u/s: 2.9 cm aortic ectasia--at risk for aneurism development.  Recheck aortic u/s 5 yrs.   Erectile dysfunction due to arterial insufficiency    Fatigue    Gout    always 2nd toe L foot (uric acid 6.20 Dec 2014 per old records)   Hepatic steatosis    History of adenomatous polyp of colon 2002   History of stomach ulcers    Hyperlipidemia    Hypertension    Hypogonadism male    Klebsiella sepsis ([place]) 01/2017   due to acute biliary tract infection (no stones) and acute  diverticulitis.   Liver mass 04/2018   GI ref: MRI abd-->?early cholangiocarcinoma-->tissue dx vs PET, vs repeat MRI 3 mo.  GI recommended PET-->results suggestive of cholangiocarcinoma.  Not amenable to bx by IR or GI.  Poss retry by Dr. [name] in GI--pt decl 06/2018.Marland Kitchen  PET by Onc 08/20/18-->focus of activity centrally within liver remains but is slightly less focal and intense, no new liver lesions, no mets.  Repeat PET 02/2019.   Macrocytic anemia 01/2017   vit B12 borderline low and iron borderline low: checking hemoccults and starting vit B12 PO and iron PO as of 02/11/17.  Vit B12 and folate normal as of GI f/u 06/2018.   Myogenic ptosis of bilateral eyelids 2018   Plastic surgery in [place], [place] to do surg as of 03/2017.   NASH (nonalcoholic steatohepatitis) 06/2017   LFTs up, abd u/s showed fatty liver but no other abnormality.   Osteoarthritis of right shoulder 09/2018   Near end-stage --->glenohumeral joint-->intra-articular steroid injection 09/2018 (Dr. Delilah Shan).   Osteoarthritis of right shoulder    11/2018-->end stage, but not ready for total shoulder replacement.   Past use of tobacco    quit 1984   Rectal bleeding    Rosacea    S/P coronary artery bypass graft x 3    Urine incontinence    Dr. Karsten Ro    Past Surgical History:  Procedure Laterality Date   CARDIOVASCULAR STRESS TEST  08/28/2016   Low risk myoview, normal EF, no  ischemia.   CATARACT EXTRACTION, BILATERAL     COLONOSCOPY  2002, 2006, 02/25/2009; 06/11/18   Polyp x 1 2002, none 2006 or 2010.  Adenomatous polyp 06/11/18+  signif diverticulosis.  NO FURTHER COLONOSCOPIES DUE TO AGE.   CORONARY ARTERY BYPASS GRAFT  06/2009   descending,saphenous vein graft to first obtuse marginal, sequential saphenous vein graft to posterior descending and posterolateral   Endoscopic vein harvest right thigh     IR RADIOLOGIST EVAL & MGMT  06/12/2018   IR RADIOLOGIST EVAL & MGMT  09/18/2018   IR RADIOLOGIST EVAL & MGMT   05/26/2019   PTCA     TONSILLECTOMY     UMBILICAL HERNIA REPAIR     2009   VASECTOMY      Allergies: Sulfonamide derivatives  Medications: Prior to Admission medications   Medication Sig Start Date End Date Taking? Authorizing Provider  allopurinol (ZYLOPRIM) 100 MG tablet TAKE ONE TABLET BY MOUTH ON MONDAY, WEDNESDAY, AND FRIDAY 01/26/19   McGowen, Adrian Blackwater, MD  ALPRAZolam (NIRAVAM) 0.25 MG dissolvable tablet Take 0.25 mg by mouth at bedtime as needed for anxiety.    [provider]  aspirin 325 MG tablet Take 325 mg by mouth daily.      [provider]  betamethasone dipropionate (DIPROLENE) 0.05 % cream apply TO THE AFFECTED AREA nightly. DO not apply TO FACE OR SKIN folds. 12/17/18   [provider]  ferrous sulfate 325 (65 FE) MG EC tablet Take 325 mg by mouth daily with breakfast.    [provider]  finasteride (PROSCAR) 5 MG tablet TAKE ONE TABLET BY MOUTH EVERY DAY 05/04/19   McGowen, Adrian Blackwater, MD  Lancets [facility] ULTRASOFT) lancets Use to check blood sugar once daily 04/15/18   McGowen, Adrian Blackwater, MD  lisinopril (PRINIVIL,ZESTRIL) 5 MG tablet TAKE ONE TABLET BY MOUTH DAILY 01/26/19   McGowen, Adrian Blackwater, MD  metFORMIN (GLUCOPHAGE-XR) 500 MG 24 hr tablet TAKE ONE TABLET BY MOUTH TWICE DAILY WITH MEALS 12/15/18   McGowen, Adrian Blackwater, MD  metoprolol tartrate (LOPRESSOR) 25 MG tablet TAKE ONE TABLET BY MOUTH TWICE DAILY 08/11/18   McGowen, Adrian Blackwater, MD  MULTIPLE VITAMIN PO Take 1 tablet by mouth daily.     [provider]  nitroGLYCERIN (NITROSTAT) 0.4 MG SL tablet Place 1 tablet (0.4 mg total) under the tongue every 5 (five) minutes as needed for chest pain. Patient not taking: Reported on 05/21/2019 08/20/16 10/20/18  Josue Hector, MD  omeprazole (PRILOSEC) 40 MG capsule Take 1 capsule (40 mg total) by mouth daily. 05/07/19   McGowen, Adrian Blackwater, MD  PREVIDENT 5000 PLUS 1.1 % CREA dental cream BRUSH throughly FOR TWO MINUTES AT NIGHT, spit OUT AND DO  not eat OR drink FOR 30 MINUTES 10/27/18   [provider]  rosuvastatin (CRESTOR) 10 MG tablet TAKE ONE TABLET BY MOUTH EVERY DAY 05/04/19   McGowen, Adrian Blackwater, MD  tamsulosin (FLOMAX) 0.4 MG CAPS capsule TAKE ONE CAPSULE BY MOUTH DAILY 03/09/19   McGowen, Adrian Blackwater, MD  Trospium Chloride 60 MG CP24 TAKE ONE CAPSULE BY MOUTH DAILY 06/10/18   McGowen, Adrian Blackwater, MD  vitamin B-12 (CYANOCOBALAMIN) 1000 MCG tablet Take 1,000 mcg by mouth daily.    [provider]     Family History  Problem Relation Age of Onset   Cancer Mother    Cancer Father        pt points to LLQ as  area of cancer, so potentially could have  been intestinal.      Colon cancer Neg Hx    Esophageal cancer Neg Hx    Stomach cancer Neg Hx    Rectal cancer Neg Hx     Social History   Socioeconomic History   Marital status: Married    Spouse name: Not on file   Number of children: 3   Years of education: 16   Highest education level: Not on file  Occupational History   Not on file  Social Needs   Financial resource strain: Not on file   Food insecurity    Worry: Not on file    Inability: Not on file   Transportation needs    Medical: Not on file    Non-medical: Not on file  Tobacco Use   Smoking status: Former Smoker    Packs/day: 1.00    Years: 30.00    Pack years: 30.00    Types: Cigarettes    Quit date: 03/15/1983    Years since quitting: 36.2   Smokeless tobacco: Never Used   Tobacco comment: Quit 1984  Substance and Sexual Activity   Alcohol use: Yes    Alcohol/week: 5.0 - 7.0 standard drinks    Types: 5 - 7 Glasses of wine per week    Comment: quit etoh 1999 but in ~ 2017 began having a glass of wine with dinner    Drug use: No   Sexual activity: Never  Lifestyle   Physical activity    Days per week: Not on file    Minutes per session: Not on file   Stress: Not on file  Relationships   Social connections    Talks on phone: Not on file    Gets together:  Not on file    Attends religious service: Not on file    Active member of club or organization: Not on file    Attends meetings of clubs or organizations: Not on file    Relationship status: Not on file  Other Topics Concern   Not on file  Social History Narrative   Married 1957, has 3 sons.   [place]. 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    End of life Care: DNR, no prolonged heroic measures or prolonged supportive care.    Former smoker, quit 1984.   Quit alcohol when dx'd with DM in 2009. ~ 2017 began having glass of wine with dinner.   No exercise.    ECOG Status: 1 - Symptomatic but completely ambulatory  Review of Systems: A 12 point ROS discussed and pertinent positives are indicated in the HPI above.  All other systems are negative.  Review of Systems  Vital Signs: BP (!) 145/74 (BP Location: Right Arm)    Pulse 63    Temp 97.8 F (36.6 C)    SpO2 96%   Physical Exam  No acute distress. Cardiovascular: Regular rate and rhythm Lungs: Clear bilaterally Abdomen: Soft, normal bowel gas sounds.  Nontender. Neurologic: Grossly intact Psychiatric: Normal mood and affect   Imaging: Nm Pet Image Restag (ps) Skull Base To Thigh  Result Date: 05/19/2019 CLINICAL DATA:  Subsequent treatment strategy for colorectal cancer. Indeterminate central liver mass with biliary dilatation worrisome for developing central cholangiocarcinoma. EXAM: NUCLEAR MEDICINE PET SKULL BASE TO THIGH TECHNIQUE: 8.7 mCi F-18 FDG was injected intravenously. Full-ring PET imaging was performed from the skull base to thigh after the radiotracer. CT data was obtained and used for attenuation correction  and anatomic localization. Fasting blood glucose: 90 mg/dl COMPARISON:  PET-CT 08/20/2018 and 05/19/2018. Abdominal MRI 05/03/2018. FINDINGS: Mediastinal blood pool activity: SUV max 2.0 Liver activity: SUV max NA NECK: No hypermetabolic  cervical lymph nodes are identified.There are no lesions of the pharyngeal mucosal space. Incidental CT findings: Bilateral carotid atherosclerosis. CHEST: There are no hypermetabolic mediastinal, hilar or axillary lymph nodes. There is no suspicious pulmonary activity or nodularity. Incidental CT findings: Atherosclerosis of aorta, great vessels and coronary arteries status post median sternotomy and CABG. ABDOMEN/PELVIS: Central hypermetabolic linear activity is again noted within the right hepatic lobe. This shows increased metabolic activity compared with the most recent study (SUV max 5.7, previously 4.3). Distal to this lesion, there is intrahepatic biliary dilatation and retraction of the hepatic capsule. No other hypermetabolic hepatic activity is demonstrated. There is no abnormal activity within the spleen, pancreas or adrenal glands. Prominent activity throughout the colon is similar to the previous study. There is no hypermetabolic nodal activity. Incidental CT findings: There are prominent diverticular changes throughout the colon, greatest distally. There is diffuse aortic and branch vessel atherosclerosis. SKELETON: There is no hypermetabolic activity to suggest osseous metastatic disease. Incidental CT findings: Moderate lumbar spondylosis. IMPRESSION: 1. Interval increased metabolic activity associated with the central right hepatic lesion. The lesion itself is not well visualized by CT, but is associated with capsular retraction and intrahepatic biliary dilatation, features which support possible cholangiocarcinoma. Consider follow-up MRI. 2. No new activity to suggest metastatic colon cancer. 3. Prominent activity again noted throughout the colon without focally concerning lesion. Electronically Signed   By: Richardean Sale M.D.   On: 05/19/2019 09:15   Ir Radiologist Eval & Mgmt  Result Date: 05/26/2019 Please refer to notes tab for details about interventional procedure. (Op  Note)   Labs:  CBC: Recent Labs    07/01/18 1115 08/28/18 1407  WBC 7.1 6.0  HGB 13.4 13.3  HCT 39.5 39.5  PLT 160.0 169.0    COAGS: No results for input(s): INR, APTT in the last 8760 hours.  BMP: Recent Labs    07/01/18 1115 08/28/18 1407 02/02/19 1411  NA 142 142 141  K 4.4 4.2 4.0  CL 105 107 104  CO2 31 26 27   GLUCOSE 107* 101* 137*  BUN 15 15 15   CALCIUM 9.5 9.1 8.6  CREATININE 1.19 1.08 1.04    LIVER FUNCTION TESTS: Recent Labs    07/01/18 1115 08/28/18 1407  BILITOT 0.5 0.4  AST 15 14  ALT 16 12  ALKPHOS 79 68  PROT 6.6 6.5  ALBUMIN 4.1 4.0    TUMOR MARKERS: Recent Labs    07/01/18 1115 08/28/18 1407  CEA  --  2.0  CA199 18 12    Assessment and Plan:  Asymptomatic 84 year old male with an indeterminate ill-defined central hepatic hilar abnormality with associated segmental biliary dilatation.  Surveillance PET imaging demonstrates minimal increase in PET activity with slight capsular retraction in the segment 8 region.  Certainly no significant progression, additional hepatic disease, extrahepatic disease or any metastatic process.  No ascites.  Imaging findings were reviewed today with the patient and his wife.  All questions were addressed.  Plan: He would like to continue conservative management with imaging surveillance.  Recommend repeat MRI in 6 months and follow-up with GI as well.   Thank you for this interesting consult.  I greatly enjoyed meeting Dawayne Ohair Fallen and look forward to participating in their care.  A copy of this report was sent  to the requesting provider on this date.  Electronically Signed: Greggory Keen 05/26/2019, 10:50 AM   I spent a total of    40 Minutes in face to face in clinical consultation, greater than 50% of which was counseling/coordinating care for this patient with an indeterminate central liver abnormality.

## 2019-05-28 DIAGNOSIS — Z961 Presence of intraocular lens: Secondary | ICD-10-CM | POA: Diagnosis not present

## 2019-05-28 DIAGNOSIS — H26491 Other secondary cataract, right eye: Secondary | ICD-10-CM | POA: Diagnosis not present

## 2019-05-28 DIAGNOSIS — E119 Type 2 diabetes mellitus without complications: Secondary | ICD-10-CM | POA: Diagnosis not present

## 2019-05-28 DIAGNOSIS — H43811 Vitreous degeneration, right eye: Secondary | ICD-10-CM | POA: Diagnosis not present

## 2019-05-28 LAB — HM DIABETES EYE EXAM

## 2019-06-02 ENCOUNTER — Encounter: Payer: Self-pay | Admitting: Family Medicine

## 2019-06-05 ENCOUNTER — Ambulatory Visit (INDEPENDENT_AMBULATORY_CARE_PROVIDER_SITE_OTHER): Payer: Medicare Other | Admitting: Family Medicine

## 2019-06-05 ENCOUNTER — Other Ambulatory Visit: Payer: Self-pay

## 2019-06-05 DIAGNOSIS — E119 Type 2 diabetes mellitus without complications: Secondary | ICD-10-CM | POA: Diagnosis not present

## 2019-06-05 DIAGNOSIS — I2510 Atherosclerotic heart disease of native coronary artery without angina pectoris: Secondary | ICD-10-CM

## 2019-06-05 DIAGNOSIS — I10 Essential (primary) hypertension: Secondary | ICD-10-CM | POA: Diagnosis not present

## 2019-06-05 LAB — LIPID PANEL
Cholesterol: 104 mg/dL (ref 0–200)
HDL: 40.5 mg/dL (ref >39.00)
LDL Cholesterol: 52 mg/dL (ref 0–99)
NonHDL: 63.73
Total CHOL/HDL Ratio: 3
Triglycerides: 58 mg/dL (ref 0.0–149.0)
VLDL: 11.6 mg/dL (ref 0.0–40.0)

## 2019-06-05 LAB — COMPREHENSIVE METABOLIC PANEL
ALT: 15 U/L (ref 0–53)
AST: 16 U/L (ref 0–37)
Albumin: 4 g/dL (ref 3.5–5.2)
Alkaline Phosphatase: 105 U/L (ref 39–117)
BUN: 14 mg/dL (ref 6–23)
CO2: 28 mEq/L (ref 19–32)
Calcium: 8.5 mg/dL (ref 8.4–10.5)
Chloride: 105 mEq/L (ref 96–112)
Creatinine, Ser: 1.04 mg/dL (ref 0.40–1.50)
GFR: 68.29 mL/min (ref >60.00)
Glucose, Bld: 99 mg/dL (ref 70–99)
Potassium: 4.1 mEq/L (ref 3.5–5.1)
Sodium: 142 mEq/L (ref 135–145)
Total Bilirubin: 0.6 mg/dL (ref 0.2–1.2)
Total Protein: 6.3 g/dL (ref 6.0–8.3)

## 2019-06-05 LAB — HEMOGLOBIN A1C: Hgb A1c MFr Bld: 6.1 % (ref 4.6–6.5)

## 2019-06-08 DIAGNOSIS — H02831 Dermatochalasis of right upper eyelid: Secondary | ICD-10-CM | POA: Diagnosis not present

## 2019-06-08 DIAGNOSIS — H02834 Dermatochalasis of left upper eyelid: Secondary | ICD-10-CM | POA: Diagnosis not present

## 2019-06-08 DIAGNOSIS — H43813 Vitreous degeneration, bilateral: Secondary | ICD-10-CM | POA: Diagnosis not present

## 2019-06-08 DIAGNOSIS — Z961 Presence of intraocular lens: Secondary | ICD-10-CM | POA: Diagnosis not present

## 2019-06-08 DIAGNOSIS — H26491 Other secondary cataract, right eye: Secondary | ICD-10-CM | POA: Diagnosis not present

## 2019-06-08 DIAGNOSIS — H527 Unspecified disorder of refraction: Secondary | ICD-10-CM | POA: Diagnosis not present

## 2019-06-19 ENCOUNTER — Other Ambulatory Visit: Payer: Self-pay | Admitting: Family Medicine

## 2019-06-22 ENCOUNTER — Other Ambulatory Visit: Payer: Self-pay | Admitting: Family Medicine

## 2019-06-23 ENCOUNTER — Other Ambulatory Visit: Payer: Self-pay

## 2019-06-23 ENCOUNTER — Emergency Department (HOSPITAL_COMMUNITY): Payer: Medicare Other

## 2019-06-23 ENCOUNTER — Telehealth: Payer: Self-pay | Admitting: Family Medicine

## 2019-06-23 ENCOUNTER — Encounter (HOSPITAL_COMMUNITY): Payer: Self-pay | Admitting: Emergency Medicine

## 2019-06-23 ENCOUNTER — Inpatient Hospital Stay (HOSPITAL_COMMUNITY)
Admission: EM | Admit: 2019-06-23 | Discharge: 2019-06-29 | DRG: 872 | Disposition: A | Discharge status: Home Health | Payer: Medicare Other | Attending: Internal Medicine | Admitting: Internal Medicine

## 2019-06-23 DIAGNOSIS — Z882 Allergy status to sulfonamides status: Secondary | ICD-10-CM

## 2019-06-23 DIAGNOSIS — R079 Chest pain, unspecified: Secondary | ICD-10-CM | POA: Diagnosis not present

## 2019-06-23 DIAGNOSIS — R32 Unspecified urinary incontinence: Secondary | ICD-10-CM | POA: Diagnosis present

## 2019-06-23 DIAGNOSIS — Z87891 Personal history of nicotine dependence: Secondary | ICD-10-CM

## 2019-06-23 DIAGNOSIS — N179 Acute kidney failure, unspecified: Secondary | ICD-10-CM | POA: Diagnosis not present

## 2019-06-23 DIAGNOSIS — Z20828 Contact with and (suspected) exposure to other viral communicable diseases: Secondary | ICD-10-CM | POA: Diagnosis not present

## 2019-06-23 DIAGNOSIS — R110 Nausea: Secondary | ICD-10-CM | POA: Diagnosis not present

## 2019-06-23 DIAGNOSIS — F0390 Unspecified dementia without behavioral disturbance: Secondary | ICD-10-CM | POA: Diagnosis present

## 2019-06-23 DIAGNOSIS — Z7952 Long term (current) use of systemic steroids: Secondary | ICD-10-CM

## 2019-06-23 DIAGNOSIS — R109 Unspecified abdominal pain: Secondary | ICD-10-CM | POA: Diagnosis not present

## 2019-06-23 DIAGNOSIS — R945 Abnormal results of liver function studies: Secondary | ICD-10-CM

## 2019-06-23 DIAGNOSIS — K8031 Calculus of bile duct with cholangitis, unspecified, with obstruction: Secondary | ICD-10-CM | POA: Diagnosis present

## 2019-06-23 DIAGNOSIS — A4159 Other Gram-negative sepsis: Secondary | ICD-10-CM | POA: Diagnosis not present

## 2019-06-23 DIAGNOSIS — I10 Essential (primary) hypertension: Secondary | ICD-10-CM | POA: Diagnosis present

## 2019-06-23 DIAGNOSIS — Z7984 Long term (current) use of oral hypoglycemic drugs: Secondary | ICD-10-CM

## 2019-06-23 DIAGNOSIS — Z809 Family history of malignant neoplasm, unspecified: Secondary | ICD-10-CM

## 2019-06-23 DIAGNOSIS — F0280 Dementia in other diseases classified elsewhere without behavioral disturbance: Secondary | ICD-10-CM | POA: Diagnosis present

## 2019-06-23 DIAGNOSIS — K6389 Other specified diseases of intestine: Secondary | ICD-10-CM | POA: Diagnosis not present

## 2019-06-23 DIAGNOSIS — C221 Intrahepatic bile duct carcinoma: Secondary | ICD-10-CM | POA: Diagnosis present

## 2019-06-23 DIAGNOSIS — N5201 Erectile dysfunction due to arterial insufficiency: Secondary | ICD-10-CM | POA: Diagnosis present

## 2019-06-23 DIAGNOSIS — K8309 Other cholangitis: Secondary | ICD-10-CM | POA: Diagnosis not present

## 2019-06-23 DIAGNOSIS — K838 Other specified diseases of biliary tract: Secondary | ICD-10-CM | POA: Diagnosis not present

## 2019-06-23 DIAGNOSIS — N401 Enlarged prostate with lower urinary tract symptoms: Secondary | ICD-10-CM | POA: Diagnosis present

## 2019-06-23 DIAGNOSIS — K5750 Diverticulosis of both small and large intestine without perforation or abscess without bleeding: Secondary | ICD-10-CM | POA: Diagnosis present

## 2019-06-23 DIAGNOSIS — R7881 Bacteremia: Secondary | ICD-10-CM | POA: Diagnosis present

## 2019-06-23 DIAGNOSIS — B961 Klebsiella pneumoniae [K. pneumoniae] as the cause of diseases classified elsewhere: Secondary | ICD-10-CM | POA: Diagnosis present

## 2019-06-23 DIAGNOSIS — K7581 Nonalcoholic steatohepatitis (NASH): Secondary | ICD-10-CM | POA: Diagnosis present

## 2019-06-23 DIAGNOSIS — E119 Type 2 diabetes mellitus without complications: Secondary | ICD-10-CM | POA: Diagnosis present

## 2019-06-23 DIAGNOSIS — A4151 Sepsis due to Escherichia coli [E. coli]: Secondary | ICD-10-CM | POA: Diagnosis not present

## 2019-06-23 DIAGNOSIS — Z7982 Long term (current) use of aspirin: Secondary | ICD-10-CM

## 2019-06-23 DIAGNOSIS — M5489 Other dorsalgia: Secondary | ICD-10-CM | POA: Diagnosis not present

## 2019-06-23 DIAGNOSIS — K831 Obstruction of bile duct: Secondary | ICD-10-CM

## 2019-06-23 DIAGNOSIS — K5900 Constipation, unspecified: Secondary | ICD-10-CM | POA: Diagnosis present

## 2019-06-23 DIAGNOSIS — E785 Hyperlipidemia, unspecified: Secondary | ICD-10-CM | POA: Diagnosis present

## 2019-06-23 DIAGNOSIS — I2510 Atherosclerotic heart disease of native coronary artery without angina pectoris: Secondary | ICD-10-CM | POA: Diagnosis present

## 2019-06-23 DIAGNOSIS — Z951 Presence of aortocoronary bypass graft: Secondary | ICD-10-CM

## 2019-06-23 DIAGNOSIS — E876 Hypokalemia: Secondary | ICD-10-CM | POA: Diagnosis present

## 2019-06-23 DIAGNOSIS — K529 Noninfective gastroenteritis and colitis, unspecified: Secondary | ICD-10-CM | POA: Diagnosis not present

## 2019-06-23 DIAGNOSIS — I959 Hypotension, unspecified: Secondary | ICD-10-CM | POA: Diagnosis not present

## 2019-06-23 DIAGNOSIS — I771 Stricture of artery: Secondary | ICD-10-CM | POA: Diagnosis present

## 2019-06-23 DIAGNOSIS — M109 Gout, unspecified: Secondary | ICD-10-CM | POA: Diagnosis present

## 2019-06-23 DIAGNOSIS — Z86010 Personal history of colonic polyps: Secondary | ICD-10-CM

## 2019-06-23 DIAGNOSIS — Z8711 Personal history of peptic ulcer disease: Secondary | ICD-10-CM

## 2019-06-23 DIAGNOSIS — Z79899 Other long term (current) drug therapy: Secondary | ICD-10-CM

## 2019-06-23 DIAGNOSIS — R7989 Other specified abnormal findings of blood chemistry: Secondary | ICD-10-CM | POA: Diagnosis present

## 2019-06-23 DIAGNOSIS — E291 Testicular hypofunction: Secondary | ICD-10-CM | POA: Diagnosis present

## 2019-06-23 LAB — URINALYSIS, ROUTINE W REFLEX MICROSCOPIC
Bacteria, UA: NONE SEEN
Bilirubin Urine: NEGATIVE
Glucose, UA: NEGATIVE mg/dL
Hgb urine dipstick: NEGATIVE
Ketones, ur: 5 mg/dL — AB
Leukocytes,Ua: NEGATIVE
Nitrite: NEGATIVE
Protein, ur: 30 mg/dL — AB
Specific Gravity, Urine: >1.046 — ABNORMAL HIGH (ref 1.005–1.030)
pH: 6 (ref 5.0–8.0)

## 2019-06-23 LAB — CBC
HCT: 42.9 % (ref 39.0–52.0)
Hemoglobin: 14.4 g/dL (ref 13.0–17.0)
MCH: 34.8 pg — ABNORMAL HIGH (ref 26.0–34.0)
MCHC: 33.6 g/dL (ref 30.0–36.0)
MCV: 103.6 fL — ABNORMAL HIGH (ref 80.0–100.0)
Platelets: 134 10*3/uL — ABNORMAL LOW (ref 150–400)
RBC: 4.14 MIL/uL — ABNORMAL LOW (ref 4.22–5.81)
RDW: 14.2 % (ref 11.5–15.5)
WBC: 7.6 10*3/uL (ref 4.0–10.5)
nRBC: 0 % (ref 0.0–0.2)

## 2019-06-23 LAB — COMPREHENSIVE METABOLIC PANEL WITH GFR
ALT: 463 U/L — ABNORMAL HIGH (ref 0–44)
AST: 1061 U/L — ABNORMAL HIGH (ref 15–41)
Albumin: 3.4 g/dL — ABNORMAL LOW (ref 3.5–5.0)
Alkaline Phosphatase: 256 U/L — ABNORMAL HIGH (ref 38–126)
Anion gap: 13 (ref 5–15)
BUN: 15 mg/dL (ref 8–23)
CO2: 19 mmol/L — ABNORMAL LOW (ref 22–32)
Calcium: 8.5 mg/dL — ABNORMAL LOW (ref 8.9–10.3)
Chloride: 107 mmol/L (ref 98–111)
Creatinine, Ser: 1.17 mg/dL (ref 0.61–1.24)
GFR calc Af Amer: >60 mL/min
GFR calc non Af Amer: 58 mL/min — ABNORMAL LOW
Glucose, Bld: 162 mg/dL — ABNORMAL HIGH (ref 70–99)
Potassium: 3.7 mmol/L (ref 3.5–5.1)
Sodium: 139 mmol/L (ref 135–145)
Total Bilirubin: 2 mg/dL — ABNORMAL HIGH (ref 0.3–1.2)
Total Protein: 6.1 g/dL — ABNORMAL LOW (ref 6.5–8.1)

## 2019-06-23 LAB — TROPONIN I (HIGH SENSITIVITY)
Troponin I (High Sensitivity): 5 ng/L (ref <18)
Troponin I (High Sensitivity): 7 ng/L

## 2019-06-23 LAB — BILIRUBIN, DIRECT: Bilirubin, Direct: 1.4 mg/dL — ABNORMAL HIGH (ref 0.0–0.2)

## 2019-06-23 LAB — LIPASE, BLOOD: Lipase: 22 U/L (ref 11–51)

## 2019-06-23 LAB — SARS CORONAVIRUS 2 BY RT PCR (HOSPITAL ORDER, PERFORMED IN ~~LOC~~ HOSPITAL LAB): SARS Coronavirus 2: NEGATIVE

## 2019-06-23 MED ORDER — SODIUM CHLORIDE 0.9 % IV BOLUS
1000.0000 mL | Freq: Once | INTRAVENOUS | Status: AC
  Administered 2019-06-23: 1000 mL via INTRAVENOUS

## 2019-06-23 MED ORDER — MORPHINE SULFATE (PF) 4 MG/ML IV SOLN
4.0000 mg | Freq: Once | INTRAVENOUS | Status: AC
  Administered 2019-06-23: 4 mg via INTRAVENOUS
  Filled 2019-06-23: qty 1

## 2019-06-23 MED ORDER — SODIUM CHLORIDE 0.9 % IV SOLN
2.0000 g | Freq: Once | INTRAVENOUS | Status: AC
  Administered 2019-06-23: 2 g via INTRAVENOUS
  Filled 2019-06-23: qty 2

## 2019-06-23 MED ORDER — PIPERACILLIN-TAZOBACTAM 3.375 G IVPB
3.3750 g | Freq: Three times a day (TID) | INTRAVENOUS | Status: DC
  Administered 2019-06-23 – 2019-06-26 (×8): 3.375 g via INTRAVENOUS
  Filled 2019-06-23 (×9): qty 50

## 2019-06-23 MED ORDER — IOHEXOL 350 MG/ML SOLN
100.0000 mL | Freq: Once | INTRAVENOUS | Status: AC | PRN
  Administered 2019-06-23: 100 mL via INTRAVENOUS

## 2019-06-23 MED ORDER — METRONIDAZOLE IN NACL 5-0.79 MG/ML-% IV SOLN
500.0000 mg | Freq: Once | INTRAVENOUS | Status: AC
  Administered 2019-06-23: 500 mg via INTRAVENOUS
  Filled 2019-06-23: qty 100

## 2019-06-23 NOTE — ED Provider Notes (Addendum)
[facility] EMERGENCY DEPARTMENT Provider Note   CSN: [id number] Arrival date & time: 06/23/19  1618    History   Chief Complaint Chief Complaint  Patient presents with  . Back Pain    HPI Ricardo Washington is a 84 y.o. male.     Pt presents to the ED today with chest, back, and abdominal pain.  He is unable to describe his pain.  He said he just knows it should not be there.  The pt was given 324 mg asa and 4 mg zofran by EMS.  He denies any f/c.    Pt also has a hx of an indeterminate central hilar hepatic mass with associated segmental R biliary dilatation.  He is followed by Dr. [name] with [facility].  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  It is not amenable to EUS bx or IR bx.  Last PET scan was in April of 2020.  IMPRESSION: 1. Interval increased metabolic activity associated with the central right hepatic lesion. The lesion itself is not well visualized by CT, but is associated with capsular retraction and intrahepatic biliary dilatation, features which support possible cholangiocarcinoma. Consider follow-up MRI. 2. No new activity to suggest metastatic colon cancer. 3. Prominent activity again noted throughout the colon without focally concerning lesion.     Past Medical History:  Diagnosis Date  . BPH with obstruction/lower urinary tract symptoms 10/27/2015   Dr. Karsten Ro  . Cataract   . Colon wall thickening 04/2018   "mass-like" per radiologist interpretation; colonoscopy as next step---f/u colonoscopy showed 'tics but o/w normal.  NO further colonoscopies needed due to age.  . Coronary artery disease    with preserved LV function.  . Dementia ([place])   . Diabetes mellitus without complication ([place])   . Diverticulosis of colon    severe, entire colon  . Dysphagia 11/2018   Barium swallow: mild esophageal dysmotility.  . Ectatic abdominal aorta ([place]) 01/2017   Abd u/s: 2.9 cm aortic  ectasia--at risk for aneurism development.  Recheck aortic u/s 5 yrs.  . Erectile dysfunction due to arterial insufficiency   . Fatigue   . Gout    always 2nd toe L foot (uric acid 6.20 Dec 2014 per old records)  . Hepatic steatosis   . History of adenomatous polyp of colon 2002  . History of stomach ulcers   . Hyperlipidemia   . Hypertension   . Hypogonadism male   . Klebsiella sepsis ([place]) 01/2017   due to acute biliary tract infection (no stones) and acute diverticulitis.  . Liver mass 04/2018   GI ref: MRI abd-->?early cholangiocarcinoma-->tissue dx vs PET, vs repeat MRI 3 mo.  GI recommended PET-->results suggestive of cholangiocarcinoma.  Not amenable to bx by IR or GI.  Poss retry by Dr. [name] in GI--pt decl 06/2018.Marland Kitchen  PET by Onc 08/20/18-->focus of activity centrally within liver remains but is slightly less focal and intense, no new liver lesions, no mets.  Repeat PET 02/2019.  . Macrocytic anemia 01/2017   vit B12 borderline low and iron borderline low: checking hemoccults and starting vit B12 PO and iron PO as of 02/11/17.  Vit B12 and folate normal as of GI f/u 06/2018.  . Myogenic ptosis of bilateral eyelids 2018   Plastic surgery in [place], [place] to do surg as of 03/2017.  Marland Kitchen NASH (nonalcoholic steatohepatitis) 06/2017   LFTs up, abd u/s showed fatty liver but no other abnormality.  Marland Kitchen  Osteoarthritis of right shoulder 09/2018   Near end-stage --->glenohumeral joint-->intra-articular steroid injection 09/2018 (Dr. Delilah Shan).  . Osteoarthritis of right shoulder    11/2018-->end stage, but not ready for total shoulder replacement.  . Past use of tobacco    quit 1984  . Rectal bleeding   . Rosacea   . S/P coronary artery bypass graft x 3   . Urine incontinence    Dr. Karsten Ro    Patient Active Problem List   Diagnosis Date Noted  . Abnormal CT scan 05/14/2018  . Elevated LFTs 05/14/2018  . Sepsis due to Klebsiella pneumoniae ([place]) 02/04/2017  . Acalculous cholecystitis   . Sepsis,  unspecified organism ([place]) 01/31/2017  . Acute cholecystitis 01/31/2017  . Diverticulitis of colon 01/31/2017  . BPH with obstruction/lower urinary tract symptoms 10/27/2015  . Memory loss 02/16/2013  . Routine health maintenance 08/05/2012  . BPH (benign prostatic hyperplasia) 01/06/2010  . CATARACT, HX OF 01/06/2010  . Coronary atherosclerosis 08/07/2009  . TOBACCO USE, QUIT 08/07/2009  . CORONARY ARTERY BYPASS GRAFT, THREE VESSEL, HX OF 08/07/2009  . PERCUTANEOUS TRANSLUMINAL CORONARY ANGIOPLASTY, HX OF 08/07/2009  . Diverticulosis of colon 02/22/2009  . Personal history of colonic polyps 02/22/2009  . AODM 08/24/2008  . Diabetes mellitus, type 2 ([place]) 08/24/2008  . Other and unspecified hyperlipidemia 06/23/2008  . Pure hypercholesterolemia 06/23/2008  . HYPOGONADISM 04/16/2007  . GOUT 04/16/2007  . Essential hypertension 04/16/2007    Past Surgical History:  Procedure Laterality Date  . CARDIOVASCULAR STRESS TEST  08/28/2016   Low risk myoview, normal EF, no ischemia.  Marland Kitchen CATARACT EXTRACTION, BILATERAL    . COLONOSCOPY  2002, 2006, 02/25/2009; 06/11/18   Polyp x 1 2002, none 2006 or 2010.  Adenomatous polyp 06/11/18+  signif diverticulosis.  NO FURTHER COLONOSCOPIES DUE TO AGE.  Marland Kitchen CORONARY ARTERY BYPASS GRAFT  06/2009   descending,saphenous vein graft to first obtuse marginal, sequential saphenous vein graft to posterior descending and posterolateral  . Endoscopic vein harvest right thigh    . IR RADIOLOGIST EVAL & MGMT  06/12/2018  . IR RADIOLOGIST EVAL & MGMT  09/18/2018  . IR RADIOLOGIST EVAL & MGMT  05/26/2019  . PTCA    . TONSILLECTOMY    . UMBILICAL HERNIA REPAIR     2009  . VASECTOMY          Home Medications    Prior to Admission medications   Medication Sig Start Date End Date Taking? Authorizing Provider  allopurinol (ZYLOPRIM) 100 MG tablet TAKE ONE TABLET BY MOUTH ON MONDAY, WEDNESDAY, AND FRIDAY 01/26/19   McGowen, Adrian Blackwater, MD  ALPRAZolam (NIRAVAM) 0.25 MG  dissolvable tablet Take 0.25 mg by mouth at bedtime as needed for anxiety.    [provider]  aspirin 325 MG tablet Take 325 mg by mouth daily.      [provider]  betamethasone dipropionate (DIPROLENE) 0.05 % cream apply TO THE AFFECTED AREA nightly. DO not apply TO FACE OR SKIN folds. 12/17/18   [provider]  ferrous sulfate 325 (65 FE) MG EC tablet Take 325 mg by mouth daily with breakfast.    [provider]  finasteride (PROSCAR) 5 MG tablet TAKE ONE TABLET BY MOUTH EVERY DAY 05/04/19   McGowen, Adrian Blackwater, MD  Lancets [facility] ULTRASOFT) lancets Use to check blood sugar once daily 04/15/18   McGowen, Adrian Blackwater, MD  lisinopril (PRINIVIL,ZESTRIL) 5 MG tablet TAKE ONE TABLET BY MOUTH DAILY 01/26/19   McGowen, Adrian Blackwater, MD  metFORMIN (  GLUCOPHAGE-XR) 500 MG 24 hr tablet TAKE ONE TABLET BY MOUTH TWICE DAILY WITH MEALS 06/22/19   McGowen, Adrian Blackwater, MD  metoprolol tartrate (LOPRESSOR) 25 MG tablet TAKE ONE TABLET BY MOUTH TWICE DAILY 06/22/19   McGowen, Adrian Blackwater, MD  MULTIPLE VITAMIN PO Take 1 tablet by mouth daily.     [provider]  nitroGLYCERIN (NITROSTAT) 0.4 MG SL tablet Place 1 tablet (0.4 mg total) under the tongue every 5 (five) minutes as needed for chest pain. Patient not taking: Reported on 05/21/2019 08/20/16 10/20/18  Josue Hector, MD  omeprazole (PRILOSEC) 40 MG capsule Take 1 capsule (40 mg total) by mouth daily. 05/07/19   McGowen, Adrian Blackwater, MD  PREVIDENT 5000 PLUS 1.1 % CREA dental cream BRUSH throughly FOR TWO MINUTES AT NIGHT, spit OUT AND DO not eat OR drink FOR 30 MINUTES 10/27/18   [provider]  rosuvastatin (CRESTOR) 10 MG tablet TAKE ONE TABLET BY MOUTH EVERY DAY 05/04/19   McGowen, Adrian Blackwater, MD  tamsulosin (FLOMAX) 0.4 MG CAPS capsule TAKE ONE CAPSULE BY MOUTH DAILY 03/09/19   McGowen, Adrian Blackwater, MD  Trospium Chloride 60 MG CP24 TAKE ONE CAPSULE BY MOUTH DAILY 06/22/19   McGowen, Adrian Blackwater, MD  vitamin B-12 (CYANOCOBALAMIN) 1000  MCG tablet Take 1,000 mcg by mouth daily.    [provider]    Family History Family History  Problem Relation Age of Onset  . Cancer Mother   . Cancer Father        pt points to LLQ as  area of cancer, so potentially could have been intestinal.     . Colon cancer Neg Hx   . Esophageal cancer Neg Hx   . Stomach cancer Neg Hx   . Rectal cancer Neg Hx     Social History Social History   Tobacco Use  . Smoking status: Former Smoker    Packs/day: 1.00    Years: 30.00    Pack years: 30.00    Types: Cigarettes    Quit date: 03/15/1983    Years since quitting: 36.2  . Smokeless tobacco: Never Used  . Tobacco comment: Quit 1984  Substance Use Topics  . Alcohol use: Yes    Alcohol/week: 5.0 - 7.0 standard drinks    Types: 5 - 7 Glasses of wine per week    Comment: quit etoh 1999 but in ~ 2017 began having a glass of wine with dinner   . Drug use: No     Allergies   Sulfonamide derivatives   Review of Systems Review of Systems  Cardiovascular: Positive for chest pain.  Gastrointestinal: Positive for abdominal pain.  Musculoskeletal: Positive for back pain.  All other systems reviewed and are negative.    Physical Exam Updated Vital Signs BP (!) 84/49   Pulse 95   Temp 98.2 F (36.8 C) (Oral)   Resp 18   SpO2 95%   Physical Exam Vitals signs and nursing note reviewed.  Constitutional:      Appearance: Normal appearance.  HENT:     Head: Normocephalic and atraumatic.     Right Ear: External ear normal.     Left Ear: External ear normal.     Nose: Nose normal.     Mouth/Throat:     Mouth: Mucous membranes are moist.     Pharynx: Oropharynx is clear.  Eyes:     Extraocular Movements: Extraocular movements intact.     Conjunctiva/sclera: Conjunctivae normal.     Pupils: Pupils  are equal, round, and reactive to light.  Neck:     Musculoskeletal: Normal range of motion and neck supple.  Cardiovascular:     Rate and Rhythm: Normal rate and regular  rhythm.     Pulses: Normal pulses.     Heart sounds: Normal heart sounds.  Pulmonary:     Effort: Pulmonary effort is normal.     Breath sounds: Normal breath sounds.  Abdominal:     General: Abdomen is flat. Bowel sounds are normal.     Palpations: Abdomen is soft.  Musculoskeletal: Normal range of motion.  Skin:    General: Skin is warm and dry.     Capillary Refill: Capillary refill takes less than 2 seconds.  Neurological:     General: No focal deficit present.     Mental Status: He is alert and oriented to person, place, and time.     Motor: Tremor present.  Psychiatric:        Mood and Affect: Mood normal.        Behavior: Behavior normal.        Thought Content: Thought content normal.        Judgment: Judgment normal.      ED Treatments / Results  Labs (all labs ordered are listed, but only abnormal results are displayed) Labs Reviewed  CBC - Abnormal; Notable for the following components:      Result Value   RBC 4.14 (*)    MCV 103.6 (*)    MCH 34.8 (*)    Platelets 134 (*)    All other components within normal limits  COMPREHENSIVE METABOLIC PANEL - Abnormal; Notable for the following components:   CO2 19 (*)    Glucose, Bld 162 (*)    Calcium 8.5 (*)    Total Protein 6.1 (*)    Albumin 3.4 (*)    AST 1,061 (*)    ALT 463 (*)    Alkaline Phosphatase 256 (*)    Total Bilirubin 2.0 (*)    GFR calc non Af Amer 58 (*)    All other components within normal limits  CULTURE, BLOOD (ROUTINE X 2)  CULTURE, BLOOD (ROUTINE X 2)  SARS CORONAVIRUS 2 (HOSPITAL ORDER, [facility])  LIPASE, BLOOD  URINALYSIS, ROUTINE W REFLEX MICROSCOPIC  BILIRUBIN, DIRECT  CANCER ANTIGEN 19-9  TROPONIN I (HIGH SENSITIVITY)  TROPONIN I (HIGH SENSITIVITY)    EKG EKG Interpretation  Date/Time:  Tuesday June 23 2019 16:24:20 EDT Ventricular Rate:  74 PR Interval:    QRS Duration: 96 QT Interval:  432 QTC Calculation: 480 R Axis:   54 Text  Interpretation:  nsr (pt is tremulous) Borderline T abnormalities, inferior leads Borderline prolonged QT interval No significant change since last tracing Confirmed by Isla Pence [phone]) on 06/23/2019 4:28:15 PM   Radiology Dg Chest Port 1 View  Result Date: 06/23/2019 CLINICAL DATA:  Chest pain, back pain, constipation for 1 day EXAM: PORTABLE CHEST 1 VIEW COMPARISON:  Radiograph May 05, 2011 FINDINGS: Postsurgical changes related to prior CABG including intact and aligned sternotomy wires and multiple surgical clips projecting over the mediastinum. Cardiac silhouette is borderline enlarged linear opacities in the right lung base likely reflect subsegmental atelectasis no consolidation, features of edema, pneumothorax, or effusion. Pulmonary vascularity is normally distributed. No acute osseous or soft tissue abnormality. Degenerative changes are present in the and imaged spine and shoulders. Cardiac leads overlie the chest. IMPRESSION: No acute cardiopulmonary abnormality Electronically Signed  By: Lovena Le M.D.   On: 06/23/2019 17:04   Ct Angio Chest/abd/pel For Dissection W And/or Wo Contrast  Result Date: 06/23/2019 CLINICAL DATA:  Chest and back pain for 1 day, no known injury, initial encounter EXAM: CT ANGIOGRAPHY CHEST, ABDOMEN AND PELVIS TECHNIQUE: Multidetector CT imaging through the chest, abdomen and pelvis was performed using the standard protocol during bolus administration of intravenous contrast. Multiplanar reconstructed images and MIPs were obtained and reviewed to evaluate the vascular anatomy. CONTRAST:  160m OMNIPAQUE IOHEXOL 350 MG/ML SOLN COMPARISON:  None. FINDINGS: CTA CHEST FINDINGS Cardiovascular: Thoracic aorta demonstrates bovine branching anatomy. Mild atherosclerotic changes are seen without aneurysmal dilatation or dissection. Changes of prior coronary bypass grafting are seen. No cardiac enlargement is noted. Pulmonary artery is prominent and shows no definitive  central pulmonary embolus although timing was not performed for embolus evaluation. Coronary calcifications are noted. No pericardial effusion is noted. Mediastinum/Nodes: Thoracic inlet is within normal limits. No hilar or mediastinal adenopathy is noted. No mediastinal hematoma is seen. The esophagus as visualized is within normal limits. Lungs/Pleura: The lungs are well aerated bilaterally. No focal infiltrate or sizable effusion is seen. Mild bibasilar atelectatic changes are noted. No sizable parenchymal nodules are seen. Musculoskeletal: Degenerative changes of the thoracic spine are noted. No acute rib abnormality is seen. Prior median sternotomy is noted. Review of the MIP images confirms the above findings. CTA ABDOMEN AND PELVIS FINDINGS VASCULAR Aorta: The abdominal aorta demonstrates atherosclerotic calcifications without aneurysmal dilatation or focal dissection. No extravasation is seen. Celiac: Calcifications are noted at the origin of the celiac axis although no focal stenosis is noted. SMA: Calcifications are noted at the origin of the superior mesenteric artery although no focal stenosis is seen. Renals: Single renal arteries are identified bilaterally with mild narrowing at the origins. IMA: Patent without evidence of aneurysm, dissection, vasculitis or significant stenosis. Iliacs: Iliacs are widely patent without aneurysmal dilatation or dissection. Veins: No vein abnormality is noted. Review of the MIP images confirms the above findings. NON-VASCULAR Hepatobiliary: Liver demonstrates changes of mild fatty infiltration. Mild segmental biliary dilatation is again noted similar to that seen on the prior exam. The previously seen central hypodense lesion is again identified but stable. No significant increase in biliary dilatation is noted. The gallbladder is unremarkable. Pancreas: Unremarkable. No pancreatic ductal dilatation or surrounding inflammatory changes. Spleen: Normal in size without  focal abnormality. Adrenals/Urinary Tract: Adrenal glands are within normal limits. Kidneys demonstrate a normal enhancement pattern bilaterally. No obstructive changes are seen. The bladder is well distended. Stomach/Bowel: Diverticular change of the colon is noted. Diffuse wall thickening is noted within the rectosigmoid region consistent with focal colitis. No perforation or abscess is noted. No significant pericolonic inflammatory changes are seen. The appendix is well visualized and within normal limits. No small bowel or gastric abnormality is noted. Lymphatic: No significant lymphadenopathy is noted. Reproductive: Prostate is unremarkable. Other: No abdominal wall hernia or abnormality. No abdominopelvic ascites. Musculoskeletal: Degenerative changes of lumbar spine are noted. No compression deformity is seen. Review of the MIP images confirms the above findings. IMPRESSION: No evidence of aneurysmal dilatation or dissection within the thoracic and abdominal aorta. Stable segmental biliary dilatation within segment 8 of the liver stable from the prior exam. Mild decreased attenuation is noted centrally similar to that seen on prior MRI. No significant enlargement is noted. Wall thickening within the rectosigmoid consistent with focal colitis. No abscess or perforation is noted. Chronic changes as described above. Electronically Signed  By: Inez Catalina M.D.   On: 06/23/2019 19:54    Procedures Procedures (including critical care time)  Medications Ordered in ED Medications  ceFEPIme (MAXIPIME) 2 g in sodium chloride 0.9 % 100 mL IVPB (2 g Intravenous New Bag/Given 06/23/19 2142)  metroNIDAZOLE (FLAGYL) IVPB 500 mg (500 mg Intravenous New Bag/Given 06/23/19 2146)  sodium chloride 0.9 % bolus 1,000 mL (has no administration in time range)  morphine 4 MG/ML injection 4 mg (4 mg Intravenous Given 06/23/19 1704)  iohexol (OMNIPAQUE) 350 MG/ML injection 100 mL (100 mLs Intravenous Contrast Given 06/23/19 1937)      Initial Impression / Assessment and Plan / ED Course  I have reviewed the triage vital signs and the nursing notes.  Pertinent labs & imaging results that were available during my care of the patient were reviewed by me and considered in my medical decision making (see chart for details).      Pt's records were reviewed.  Elevation in LFTs are new.  Pt d/w Dr. [name] ([facility]) who knows pt.  He recommended IV abx for possible sepsis.  This happened to him in the past with sepsis from GB (klebsiella).  GI will see in the am to eval for possible MRCP vs EUS.  Pt d/w Dr. Marthenia Rolling ([facility]) for admission.  Covid test pending, but I ordered the quick one in case he needs a procedure tomorrow morning.  Pt's wife updated.  Kellar Westberg Keil was evaluated in Emergency Department on 06/23/2019 for the symptoms described in the history of present illness. He was evaluated in the context of the global COVID-19 pandemic, which necessitated consideration that the patient might be at risk for infection with the SARS-CoV-2 virus that causes COVID-19. Institutional protocols and algorithms that pertain to the evaluation of patients at risk for COVID-19 are in a state of rapid change based on information released by regulatory bodies including the CDC and federal and state organizations. These policies and algorithms were followed during the patient's care in the ED.  CRITICAL CARE Performed by: Isla Pence   Total critical care time: 30 minutes  Critical care time was exclusive of separately billable procedures and treating other patients.  Critical care was necessary to treat or prevent imminent or life-threatening deterioration.  Critical care was time spent personally by me on the following activities: development of treatment plan with patient and/or surrogate as well as nursing, discussions with consultants, evaluation of patient's response to treatment, examination of patient, obtaining history  from patient or surrogate, ordering and performing treatments and interventions, ordering and review of laboratory studies, ordering and review of radiographic studies, pulse oximetry and re-evaluation of patient's condition.  Final Clinical Impressions(s) / ED Diagnoses   Final diagnoses:  LFT elevation  Colitis  Cholangiocarcinoma [facility])    ED Discharge Orders    None       Isla Pence, MD 06/23/19 2148    Isla Pence, MD 07/08/19 1510

## 2019-06-23 NOTE — Telephone Encounter (Signed)
Patient's wife called. Patient was transported by EMS for several back pain.

## 2019-06-23 NOTE — H&P (Signed)
History and Physical  Ricardo Washington IDP:[id number] DOB: Nov 04, 1935 DOA: 06/23/2019  Referring physician: ER provider PCP: [name], MD  Outpatient Specialists: GI Dr. [name] Patient coming from: Home  Chief Complaint: Back pain and abdominal pain  HPI:  Patient is an 84 year old Caucasian male who is unable to provide any significant history due to dementing illness.  Patient could not or not tell me why he came to the hospital.  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  Patient is said to have presented with back and abdominal pain with abnormal LFTs.  Bilirubin is only 2, AST is 1061 and ALT is 463 with alkaline phosphatase of 256 (up from 105).  CT Angio of the chest, abdomen and pelvis CT revealed "No evidence of aneurysmal dilatation or dissection within the thoracic and abdominal aorta.   Stable segmental biliary dilatation within segment 8 of the liver stable from the prior exam. Mild decreased attenuation is noted centrally similar to that seen on prior MRI. No significant enlargement is noted.  Wall thickening within the rectosigmoid consistent with focal colitis. No abscess or perforation is noted.  Chronic changes as described above".  According to Dr. Gilford Raid, ER provider, she discussed above findings with the GI doctor, Dr. [name], and Dr. [name] has asked the hospitalist team to admit patient for possible impending sepsis based on prior clinical presentation.  ED Course: On presentation to the ER, patient was afebrile, blood pressure of 84-1 171/90 125 mmHg, heart rate of 77 to 103 bpm and respiratory rate of 18 and O2 sat of 95%.  Patient has been cultured.  IV cefepime have been given.  CT scan is as above.  No  leukocytosis.  Pertinent labs: Chemistry reveals sodium of 139, potassium of 3.7, chloride 107, CO2 of 19, BUN of 15, creatinine of 1.17 with a blood sugar of 162.  Alkaline phosphatase is 256, albumin is 3.4, lipase is 22, AST is 1061, ALT is 463, total protein is 6.1, direct bilirubin is 1.4, total bilirubin is 2.  CBC reveals WBC of 7.6, hemoglobin 14.4, hematocrit of 40.8, MCV of 103.6, platelet count of 135.  EKG: Independently reviewed.   Imaging: independently reviewed.   Review of Systems:  Unobtainable.  Past Medical History:  Diagnosis Date   BPH with obstruction/lower urinary tract symptoms 10/27/2015   Dr. Karsten Ro   Cataract    Colon wall thickening 04/2018   "mass-like" per radiologist interpretation; colonoscopy as next step---f/u colonoscopy showed 'tics but o/w normal.  NO further colonoscopies needed due to age.   Coronary artery disease    with preserved LV function.   Dementia ([place])    Diabetes mellitus without complication ([place])    Diverticulosis of colon    severe, entire colon   Dysphagia 11/2018   Barium swallow: mild esophageal dysmotility.   Ectatic abdominal aorta ([place]) 01/2017   Abd u/s: 2.9 cm aortic ectasia--at risk for aneurism development.  Recheck aortic u/s 5 yrs.   Erectile dysfunction due to arterial insufficiency    Fatigue    Gout    always 2nd toe L foot (uric acid 6.20 Dec 2014 per old records)   Hepatic steatosis    History of adenomatous polyp of colon 2002   History of stomach ulcers    Hyperlipidemia  Hypertension    Hypogonadism male    Klebsiella sepsis ([place]) 01/2017   due to acute biliary tract infection (no stones) and acute diverticulitis.   Liver mass 04/2018   GI ref: MRI abd-->?early cholangiocarcinoma-->tissue dx vs PET, vs repeat MRI 3 mo.  GI recommended PET-->results suggestive of cholangiocarcinoma.  Not amenable to bx by IR or GI.  Poss retry by Dr. [name] in GI--pt decl 06/2018.Marland Kitchen  PET by Onc  08/20/18-->focus of activity centrally within liver remains but is slightly less focal and intense, no new liver lesions, no mets.  Repeat PET 02/2019.   Macrocytic anemia 01/2017   vit B12 borderline low and iron borderline low: checking hemoccults and starting vit B12 PO and iron PO as of 02/11/17.  Vit B12 and folate normal as of GI f/u 06/2018.   Myogenic ptosis of bilateral eyelids 2018   Plastic surgery in [place], [place] to do surg as of 03/2017.   NASH (nonalcoholic steatohepatitis) 06/2017   LFTs up, abd u/s showed fatty liver but no other abnormality.   Osteoarthritis of right shoulder 09/2018   Near end-stage --->glenohumeral joint-->intra-articular steroid injection 09/2018 (Dr. Delilah Shan).   Osteoarthritis of right shoulder    11/2018-->end stage, but not ready for total shoulder replacement.   Past use of tobacco    quit 1984   Rectal bleeding    Rosacea    S/P coronary artery bypass graft x 3    Urine incontinence    Dr. Karsten Ro    Past Surgical History:  Procedure Laterality Date   CARDIOVASCULAR STRESS TEST  08/28/2016   Low risk myoview, normal EF, no ischemia.   CATARACT EXTRACTION, BILATERAL     COLONOSCOPY  2002, 2006, 02/25/2009; 06/11/18   Polyp x 1 2002, none 2006 or 2010.  Adenomatous polyp 06/11/18+  signif diverticulosis.  NO FURTHER COLONOSCOPIES DUE TO AGE.   CORONARY ARTERY BYPASS GRAFT  06/2009   descending,saphenous vein graft to first obtuse marginal, sequential saphenous vein graft to posterior descending and posterolateral   Endoscopic vein harvest right thigh     IR RADIOLOGIST EVAL & MGMT  06/12/2018   IR RADIOLOGIST EVAL & MGMT  09/18/2018   IR RADIOLOGIST EVAL & MGMT  05/26/2019   PTCA     TONSILLECTOMY     UMBILICAL HERNIA REPAIR     2009   VASECTOMY       reports that he quit smoking about 36 years ago. His smoking use included cigarettes. He has a 30.00 pack-year smoking history. He has never used smokeless tobacco. He reports current  alcohol use of about 5.0 - 7.0 standard drinks of alcohol per week. He reports that he does not use drugs.  Allergies  Allergen Reactions   Sulfonamide Derivatives     Unknown, per pt and "cant stand it"    Family History  Problem Relation Age of Onset   Cancer Mother    Cancer Father        pt points to LLQ as  area of cancer, so potentially could have been intestinal.      Colon cancer Neg Hx    Esophageal cancer Neg Hx    Stomach cancer Neg Hx    Rectal cancer Neg Hx      Prior to Admission medications   Medication Sig Start Date End Date Taking? Authorizing Provider  allopurinol (ZYLOPRIM) 100 MG tablet TAKE ONE TABLET BY MOUTH ON Laurine Blazer, AND FRIDAY 01/26/19   McGowen, Adrian Blackwater, MD  ALPRAZolam (  NIRAVAM) 0.25 MG dissolvable tablet Take 0.25 mg by mouth at bedtime as needed for anxiety.    [provider]  aspirin 325 MG tablet Take 325 mg by mouth daily.      [provider]  betamethasone dipropionate (DIPROLENE) 0.05 % cream apply TO THE AFFECTED AREA nightly. DO not apply TO FACE OR SKIN folds. 12/17/18   [provider]  ferrous sulfate 325 (65 FE) MG EC tablet Take 325 mg by mouth daily with breakfast.    [provider]  finasteride (PROSCAR) 5 MG tablet TAKE ONE TABLET BY MOUTH EVERY DAY 05/04/19   McGowen, Adrian Blackwater, MD  Lancets [facility] ULTRASOFT) lancets Use to check blood sugar once daily 04/15/18   McGowen, Adrian Blackwater, MD  lisinopril (PRINIVIL,ZESTRIL) 5 MG tablet TAKE ONE TABLET BY MOUTH DAILY 01/26/19   McGowen, Adrian Blackwater, MD  metFORMIN (GLUCOPHAGE-XR) 500 MG 24 hr tablet TAKE ONE TABLET BY MOUTH TWICE DAILY WITH MEALS 06/22/19   McGowen, Adrian Blackwater, MD  metoprolol tartrate (LOPRESSOR) 25 MG tablet TAKE ONE TABLET BY MOUTH TWICE DAILY 06/22/19   McGowen, Adrian Blackwater, MD  MULTIPLE VITAMIN PO Take 1 tablet by mouth daily.     [provider]  nitroGLYCERIN (NITROSTAT) 0.4 MG SL tablet Place 1 tablet (0.4 mg total) under the  tongue every 5 (five) minutes as needed for chest pain. Patient not taking: Reported on 05/21/2019 08/20/16 10/20/18  Josue Hector, MD  omeprazole (PRILOSEC) 40 MG capsule Take 1 capsule (40 mg total) by mouth daily. 05/07/19   McGowen, Adrian Blackwater, MD  PREVIDENT 5000 PLUS 1.1 % CREA dental cream BRUSH throughly FOR TWO MINUTES AT NIGHT, spit OUT AND DO not eat OR drink FOR 30 MINUTES 10/27/18   [provider]  rosuvastatin (CRESTOR) 10 MG tablet TAKE ONE TABLET BY MOUTH EVERY DAY 05/04/19   McGowen, Adrian Blackwater, MD  tamsulosin (FLOMAX) 0.4 MG CAPS capsule TAKE ONE CAPSULE BY MOUTH DAILY 03/09/19   McGowen, Adrian Blackwater, MD  Trospium Chloride 60 MG CP24 TAKE ONE CAPSULE BY MOUTH DAILY 06/22/19   McGowen, Adrian Blackwater, MD  vitamin B-12 (CYANOCOBALAMIN) 1000 MCG tablet Take 1,000 mcg by mouth daily.    [provider]    Physical Exam: Vitals:   06/23/19 2139 06/23/19 2142 06/23/19 2145 06/23/19 2230  BP: (!) 93/47 (!) 84/49 (!) 95/45 (!) 101/46  Pulse:      Resp: 19 18 (!) 21 18  Temp:      TempSrc:      SpO2:        Constitutional:   Appears calm and comfortable Eyes:   No pallor. No jaundice.  ENMT:   external ears, nose appear normal Neck:   Neck is supple. No JVD Respiratory:   CTA bilaterally, no w/r/r.   Respiratory effort normal. No retractions or accessory muscle use Cardiovascular:   S1S2  No LE extremity edema   Abdomen:   Abdomen is soft and non tender. Organs are difficult to assess. Neurologic:   Awake and alert.  Moves all limbs.  Wt Readings from Last 3 Encounters:  05/21/19 77.4 kg  05/12/19 78.5 kg  04/01/19 81.6 kg    I have personally reviewed following labs and imaging studies  Labs on Admission:  CBC: Recent Labs  Lab 06/23/19 1717  WBC 7.6  HGB 14.4  HCT 42.9  MCV 103.6*  PLT 470*   Basic Metabolic Panel: Recent Labs  Lab 06/23/19 1717  NA 139  K 3.7  CL 107  CO2 19*  GLUCOSE 162*  BUN 15  CREATININE 1.17  CALCIUM  8.5*   Liver Function Tests: Recent Labs  Lab 06/23/19 1717  AST 1,061*  ALT 463*  ALKPHOS 256*  BILITOT 2.0*  PROT 6.1*  ALBUMIN 3.4*   Recent Labs  Lab 06/23/19 1717  LIPASE 22   No results for input(s): AMMONIA in the last 168 hours. Coagulation Profile: No results for input(s): INR, PROTIME in the last 168 hours. Cardiac Enzymes: No results for input(s): CKTOTAL, CKMB, CKMBINDEX, TROPONINI in the last 168 hours. BNP (last 3 results) No results for input(s): PROBNP in the last 8760 hours. HbA1C: No results for input(s): HGBA1C in the last 72 hours. CBG: No results for input(s): GLUCAP in the last 168 hours. Lipid Profile: No results for input(s): CHOL, HDL, LDLCALC, TRIG, CHOLHDL, LDLDIRECT in the last 72 hours. Thyroid Function Tests: No results for input(s): TSH, T4TOTAL, FREET4, T3FREE, THYROIDAB in the last 72 hours. Anemia Panel: No results for input(s): VITAMINB12, FOLATE, FERRITIN, TIBC, IRON, RETICCTPCT in the last 72 hours. Urine analysis:    Component Value Date/Time   COLORURINE AMBER (A) 06/23/2019 2158   APPEARANCEUR CLEAR 06/23/2019 2158   LABSPEC >1.046 (H) 06/23/2019 2158   PHURINE 6.0 06/23/2019 2158   GLUCOSEU NEGATIVE 06/23/2019 2158   HGBUR NEGATIVE 06/23/2019 2158   BILIRUBINUR NEGATIVE 06/23/2019 2158   KETONESUR 5 (A) 06/23/2019 2158   PROTEINUR 30 (A) 06/23/2019 2158   UROBILINOGEN 1.0 05/05/2011 1439   NITRITE NEGATIVE 06/23/2019 2158   LEUKOCYTESUR NEGATIVE 06/23/2019 2158   Sepsis Labs: @LABRCNTIP (procalcitonin:4,lacticidven:4) )No results found for this or any previous visit (from the past 240 hour(s)).    Radiological Exams on Admission: Dg Chest Port 1 View  Result Date: 06/23/2019 CLINICAL DATA:  Chest pain, back pain, constipation for 1 day EXAM: PORTABLE CHEST 1 VIEW COMPARISON:  Radiograph May 05, 2011 FINDINGS: Postsurgical changes related to prior CABG including intact and aligned sternotomy wires and multiple surgical  clips projecting over the mediastinum. Cardiac silhouette is borderline enlarged linear opacities in the right lung base likely reflect subsegmental atelectasis no consolidation, features of edema, pneumothorax, or effusion. Pulmonary vascularity is normally distributed. No acute osseous or soft tissue abnormality. Degenerative changes are present in the and imaged spine and shoulders. Cardiac leads overlie the chest. IMPRESSION: No acute cardiopulmonary abnormality Electronically Signed   By: Lovena Le M.D.   On: 06/23/2019 17:04   Ct Angio Chest/abd/pel For Dissection W And/or Wo Contrast  Result Date: 06/23/2019 CLINICAL DATA:  Chest and back pain for 1 day, no known injury, initial encounter EXAM: CT ANGIOGRAPHY CHEST, ABDOMEN AND PELVIS TECHNIQUE: Multidetector CT imaging through the chest, abdomen and pelvis was performed using the standard protocol during bolus administration of intravenous contrast. Multiplanar reconstructed images and MIPs were obtained and reviewed to evaluate the vascular anatomy. CONTRAST:  130m OMNIPAQUE IOHEXOL 350 MG/ML SOLN COMPARISON:  None. FINDINGS: CTA CHEST FINDINGS Cardiovascular: Thoracic aorta demonstrates bovine branching anatomy. Mild atherosclerotic changes are seen without aneurysmal dilatation or dissection. Changes of prior coronary bypass grafting are seen. No cardiac enlargement is noted. Pulmonary artery is prominent and shows no definitive central pulmonary embolus although timing was not performed for embolus evaluation. Coronary calcifications are noted. No pericardial effusion is noted. Mediastinum/Nodes: Thoracic inlet is within normal limits. No hilar or mediastinal adenopathy is noted. No mediastinal hematoma is seen. The esophagus as visualized is within normal limits. Lungs/Pleura: The lungs are well  aerated bilaterally. No focal infiltrate or sizable effusion is seen. Mild bibasilar atelectatic changes are noted. No sizable parenchymal nodules are  seen. Musculoskeletal: Degenerative changes of the thoracic spine are noted. No acute rib abnormality is seen. Prior median sternotomy is noted. Review of the MIP images confirms the above findings. CTA ABDOMEN AND PELVIS FINDINGS VASCULAR Aorta: The abdominal aorta demonstrates atherosclerotic calcifications without aneurysmal dilatation or focal dissection. No extravasation is seen. Celiac: Calcifications are noted at the origin of the celiac axis although no focal stenosis is noted. SMA: Calcifications are noted at the origin of the superior mesenteric artery although no focal stenosis is seen. Renals: Single renal arteries are identified bilaterally with mild narrowing at the origins. IMA: Patent without evidence of aneurysm, dissection, vasculitis or significant stenosis. Iliacs: Iliacs are widely patent without aneurysmal dilatation or dissection. Veins: No vein abnormality is noted. Review of the MIP images confirms the above findings. NON-VASCULAR Hepatobiliary: Liver demonstrates changes of mild fatty infiltration. Mild segmental biliary dilatation is again noted similar to that seen on the prior exam. The previously seen central hypodense lesion is again identified but stable. No significant increase in biliary dilatation is noted. The gallbladder is unremarkable. Pancreas: Unremarkable. No pancreatic ductal dilatation or surrounding inflammatory changes. Spleen: Normal in size without focal abnormality. Adrenals/Urinary Tract: Adrenal glands are within normal limits. Kidneys demonstrate a normal enhancement pattern bilaterally. No obstructive changes are seen. The bladder is well distended. Stomach/Bowel: Diverticular change of the colon is noted. Diffuse wall thickening is noted within the rectosigmoid region consistent with focal colitis. No perforation or abscess is noted. No significant pericolonic inflammatory changes are seen. The appendix is well visualized and within normal limits. No small bowel  or gastric abnormality is noted. Lymphatic: No significant lymphadenopathy is noted. Reproductive: Prostate is unremarkable. Other: No abdominal wall hernia or abnormality. No abdominopelvic ascites. Musculoskeletal: Degenerative changes of lumbar spine are noted. No compression deformity is seen. Review of the MIP images confirms the above findings. IMPRESSION: No evidence of aneurysmal dilatation or dissection within the thoracic and abdominal aorta. Stable segmental biliary dilatation within segment 8 of the liver stable from the prior exam. Mild decreased attenuation is noted centrally similar to that seen on prior MRI. No significant enlargement is noted. Wall thickening within the rectosigmoid consistent with focal colitis. No abscess or perforation is noted. Chronic changes as described above. Electronically Signed   By: Inez Catalina M.D.   On: 06/23/2019 19:54    EKG: Independently reviewed.   Active Problems:   Biliary sepsis   Assessment/Plan Possible biliary sepsis: Place patient in observation. Continue to assess patient [place] patient IV antibiotics (Zosyn) GI has been consulted Follow repeat lab work Further management will depend on hospital course.  Possible cholangiocarcinoma: GI team will see patient in the morning. No tissue diagnosis associated due to inability to obtain tissue sample. GI is managing.  Abnormal LFTs: Possible secondary to above. Repeat labs in the morning.  Dementia: No behavioral problems  Diabetes mellitus: Sliding scale insulin.  Keep patient n.p.o. after midnight.  DVT prophylaxis: Subcutaneous Lovenox Code Status: Full code Family Communication:  Disposition Plan: Will depend on hospital course Consults called: ER has consulted GI Admission status: Observation  Time spent: 65 minutes minutes  Dana Allan, MD  [facility] Pager #: [phone] 7PM-7AM contact night coverage as above  06/23/2019, 10:50  PM

## 2019-06-23 NOTE — Progress Notes (Signed)
Pharmacy Antibiotic Note  Ricardo Washington is a 84 y.o. male admitted on 06/23/2019 with Intraabdominal infection.  Pharmacy has been consulted for zosyn dosing.  Plan: Zosyn 3.375gm IV q8h Monitor clinical course, LOT, de-escalation and renal function.      Temp (24hrs), Avg:98.2 F (36.8 C), Min:98.2 F (36.8 C), Max:98.2 F (36.8 C)  Recent Labs  Lab 06/23/19 1717  WBC 7.6  CREATININE 1.17    CrCl cannot be calculated (Unknown ideal weight.).    Allergies  Allergen Reactions  . Sulfonamide Derivatives     Unknown, per pt and "cant stand it"    Antimicrobials this admission: 8/4 zosyn >>   Lauramae Kneisley A. Levada Dy, PharmD, BCPS, FNKF Clinical Pharmacist [place] Please utilize Amion for appropriate phone number to reach the unit pharmacist ([place])   06/23/2019 10:55 PM

## 2019-06-23 NOTE — Telephone Encounter (Signed)
FYI

## 2019-06-23 NOTE — ED Triage Notes (Signed)
Pt arrives via EMS from home with reports of back pain. Endorses constipation x1 day. Hx of dementia and tremors. 324 ASA and 4 mg zofran given by EMS.

## 2019-06-23 NOTE — ED Notes (Signed)
Patient transported to CT 

## 2019-06-24 ENCOUNTER — Inpatient Hospital Stay (HOSPITAL_COMMUNITY): Payer: Medicare Other

## 2019-06-24 ENCOUNTER — Encounter (HOSPITAL_COMMUNITY): Payer: Self-pay | Admitting: Internal Medicine

## 2019-06-24 DIAGNOSIS — K8309 Other cholangitis: Secondary | ICD-10-CM

## 2019-06-24 DIAGNOSIS — E291 Testicular hypofunction: Secondary | ICD-10-CM | POA: Diagnosis present

## 2019-06-24 DIAGNOSIS — N179 Acute kidney failure, unspecified: Secondary | ICD-10-CM | POA: Diagnosis present

## 2019-06-24 DIAGNOSIS — I771 Stricture of artery: Secondary | ICD-10-CM | POA: Diagnosis present

## 2019-06-24 DIAGNOSIS — E876 Hypokalemia: Secondary | ICD-10-CM | POA: Diagnosis present

## 2019-06-24 DIAGNOSIS — E785 Hyperlipidemia, unspecified: Secondary | ICD-10-CM | POA: Diagnosis present

## 2019-06-24 DIAGNOSIS — R160 Hepatomegaly, not elsewhere classified: Secondary | ICD-10-CM | POA: Diagnosis not present

## 2019-06-24 DIAGNOSIS — R7881 Bacteremia: Secondary | ICD-10-CM | POA: Diagnosis not present

## 2019-06-24 DIAGNOSIS — B9620 Unspecified Escherichia coli [E. coli] as the cause of diseases classified elsewhere: Secondary | ICD-10-CM | POA: Diagnosis not present

## 2019-06-24 DIAGNOSIS — E119 Type 2 diabetes mellitus without complications: Secondary | ICD-10-CM | POA: Diagnosis present

## 2019-06-24 DIAGNOSIS — N401 Enlarged prostate with lower urinary tract symptoms: Secondary | ICD-10-CM | POA: Diagnosis present

## 2019-06-24 DIAGNOSIS — I10 Essential (primary) hypertension: Secondary | ICD-10-CM | POA: Diagnosis present

## 2019-06-24 DIAGNOSIS — R740 Nonspecific elevation of levels of transaminase and lactic acid dehydrogenase [LDH]: Secondary | ICD-10-CM | POA: Diagnosis not present

## 2019-06-24 DIAGNOSIS — C221 Intrahepatic bile duct carcinoma: Secondary | ICD-10-CM | POA: Diagnosis present

## 2019-06-24 DIAGNOSIS — B961 Klebsiella pneumoniae [K. pneumoniae] as the cause of diseases classified elsewhere: Secondary | ICD-10-CM | POA: Diagnosis present

## 2019-06-24 DIAGNOSIS — K529 Noninfective gastroenteritis and colitis, unspecified: Secondary | ICD-10-CM

## 2019-06-24 DIAGNOSIS — C240 Malignant neoplasm of extrahepatic bile duct: Secondary | ICD-10-CM | POA: Diagnosis not present

## 2019-06-24 DIAGNOSIS — Z9689 Presence of other specified functional implants: Secondary | ICD-10-CM | POA: Diagnosis not present

## 2019-06-24 DIAGNOSIS — K838 Other specified diseases of biliary tract: Secondary | ICD-10-CM | POA: Diagnosis not present

## 2019-06-24 DIAGNOSIS — G309 Alzheimer's disease, unspecified: Secondary | ICD-10-CM | POA: Diagnosis present

## 2019-06-24 DIAGNOSIS — A4159 Other Gram-negative sepsis: Secondary | ICD-10-CM | POA: Diagnosis present

## 2019-06-24 DIAGNOSIS — Z882 Allergy status to sulfonamides status: Secondary | ICD-10-CM | POA: Diagnosis not present

## 2019-06-24 DIAGNOSIS — F0280 Dementia in other diseases classified elsewhere without behavioral disturbance: Secondary | ICD-10-CM | POA: Diagnosis present

## 2019-06-24 DIAGNOSIS — A4151 Sepsis due to Escherichia coli [E. coli]: Secondary | ICD-10-CM | POA: Diagnosis present

## 2019-06-24 DIAGNOSIS — Z87891 Personal history of nicotine dependence: Secondary | ICD-10-CM | POA: Diagnosis not present

## 2019-06-24 DIAGNOSIS — R945 Abnormal results of liver function studies: Secondary | ICD-10-CM | POA: Diagnosis not present

## 2019-06-24 DIAGNOSIS — F0390 Unspecified dementia without behavioral disturbance: Secondary | ICD-10-CM | POA: Diagnosis present

## 2019-06-24 DIAGNOSIS — I2510 Atherosclerotic heart disease of native coronary artery without angina pectoris: Secondary | ICD-10-CM | POA: Diagnosis present

## 2019-06-24 DIAGNOSIS — N5201 Erectile dysfunction due to arterial insufficiency: Secondary | ICD-10-CM | POA: Diagnosis present

## 2019-06-24 DIAGNOSIS — R7989 Other specified abnormal findings of blood chemistry: Secondary | ICD-10-CM | POA: Diagnosis present

## 2019-06-24 DIAGNOSIS — K5900 Constipation, unspecified: Secondary | ICD-10-CM | POA: Diagnosis present

## 2019-06-24 DIAGNOSIS — R109 Unspecified abdominal pain: Secondary | ICD-10-CM | POA: Diagnosis not present

## 2019-06-24 DIAGNOSIS — R32 Unspecified urinary incontinence: Secondary | ICD-10-CM | POA: Diagnosis present

## 2019-06-24 DIAGNOSIS — Z20828 Contact with and (suspected) exposure to other viral communicable diseases: Secondary | ICD-10-CM | POA: Diagnosis present

## 2019-06-24 DIAGNOSIS — K5750 Diverticulosis of both small and large intestine without perforation or abscess without bleeding: Secondary | ICD-10-CM | POA: Diagnosis present

## 2019-06-24 DIAGNOSIS — K5710 Diverticulosis of small intestine without perforation or abscess without bleeding: Secondary | ICD-10-CM | POA: Diagnosis not present

## 2019-06-24 DIAGNOSIS — K831 Obstruction of bile duct: Secondary | ICD-10-CM | POA: Diagnosis not present

## 2019-06-24 DIAGNOSIS — K8031 Calculus of bile duct with cholangitis, unspecified, with obstruction: Secondary | ICD-10-CM | POA: Diagnosis present

## 2019-06-24 LAB — COMPREHENSIVE METABOLIC PANEL
ALT: 675 U/L — ABNORMAL HIGH (ref 0–44)
AST: 1075 U/L — ABNORMAL HIGH (ref 15–41)
Albumin: 2.9 g/dL — ABNORMAL LOW (ref 3.5–5.0)
Alkaline Phosphatase: 255 U/L — ABNORMAL HIGH (ref 38–126)
Anion gap: 12 (ref 5–15)
BUN: 15 mg/dL (ref 8–23)
CO2: 22 mmol/L (ref 22–32)
Calcium: 7.9 mg/dL — ABNORMAL LOW (ref 8.9–10.3)
Chloride: 106 mmol/L (ref 98–111)
Creatinine, Ser: 1.3 mg/dL — ABNORMAL HIGH (ref 0.61–1.24)
GFR calc Af Amer: 59 mL/min — ABNORMAL LOW (ref >60)
GFR calc non Af Amer: 51 mL/min — ABNORMAL LOW (ref >60)
Glucose, Bld: 122 mg/dL — ABNORMAL HIGH (ref 70–99)
Potassium: 3.4 mmol/L — ABNORMAL LOW (ref 3.5–5.1)
Sodium: 140 mmol/L (ref 135–145)
Total Bilirubin: 2.5 mg/dL — ABNORMAL HIGH (ref 0.3–1.2)
Total Protein: 5.6 g/dL — ABNORMAL LOW (ref 6.5–8.1)

## 2019-06-24 LAB — BLOOD CULTURE ID PANEL (REFLEXED)
Acinetobacter baumannii: NOT DETECTED
Acinetobacter baumannii: NOT DETECTED
Candida albicans: NOT DETECTED
Candida albicans: NOT DETECTED
Candida glabrata: NOT DETECTED
Candida glabrata: NOT DETECTED
Candida krusei: NOT DETECTED
Candida krusei: NOT DETECTED
Candida parapsilosis: NOT DETECTED
Candida parapsilosis: NOT DETECTED
Candida tropicalis: NOT DETECTED
Candida tropicalis: NOT DETECTED
Carbapenem resistance: NOT DETECTED
Enterobacter cloacae complex: NOT DETECTED
Enterobacter cloacae complex: NOT DETECTED
Enterobacteriaceae species: DETECTED — AB
Enterobacteriaceae species: NOT DETECTED
Enterococcus species: NOT DETECTED
Enterococcus species: NOT DETECTED
Escherichia coli: DETECTED — AB
Escherichia coli: NOT DETECTED
Haemophilus influenzae: NOT DETECTED
Haemophilus influenzae: NOT DETECTED
Klebsiella oxytoca: NOT DETECTED
Klebsiella oxytoca: NOT DETECTED
Klebsiella pneumoniae: NOT DETECTED
Klebsiella pneumoniae: NOT DETECTED
Listeria monocytogenes: NOT DETECTED
Listeria monocytogenes: NOT DETECTED
Methicillin resistance: NOT DETECTED
Neisseria meningitidis: NOT DETECTED
Neisseria meningitidis: NOT DETECTED
Proteus species: NOT DETECTED
Proteus species: NOT DETECTED
Pseudomonas aeruginosa: NOT DETECTED
Pseudomonas aeruginosa: NOT DETECTED
Serratia marcescens: NOT DETECTED
Serratia marcescens: NOT DETECTED
Staphylococcus aureus (BCID): NOT DETECTED
Staphylococcus aureus (BCID): NOT DETECTED
Staphylococcus species: DETECTED — AB
Staphylococcus species: NOT DETECTED
Streptococcus agalactiae: NOT DETECTED
Streptococcus agalactiae: NOT DETECTED
Streptococcus pneumoniae: NOT DETECTED
Streptococcus pneumoniae: NOT DETECTED
Streptococcus pyogenes: NOT DETECTED
Streptococcus pyogenes: NOT DETECTED
Streptococcus species: NOT DETECTED
Streptococcus species: NOT DETECTED

## 2019-06-24 LAB — GLUCOSE, CAPILLARY
Glucose-Capillary: 107 mg/dL — ABNORMAL HIGH (ref 70–99)
Glucose-Capillary: 94 mg/dL (ref 70–99)
Glucose-Capillary: 130 mg/dL — ABNORMAL HIGH (ref 70–99)
Glucose-Capillary: 85 mg/dL (ref 70–99)
Glucose-Capillary: 82 mg/dL (ref 70–99)

## 2019-06-24 LAB — CBC
HCT: 37.3 % — ABNORMAL LOW (ref 39.0–52.0)
Hemoglobin: 12.5 g/dL — ABNORMAL LOW (ref 13.0–17.0)
MCH: 34.2 pg — ABNORMAL HIGH (ref 26.0–34.0)
MCHC: 33.5 g/dL (ref 30.0–36.0)
MCV: 102.2 fL — ABNORMAL HIGH (ref 80.0–100.0)
Platelets: 146 10*3/uL — ABNORMAL LOW (ref 150–400)
RBC: 3.65 MIL/uL — ABNORMAL LOW (ref 4.22–5.81)
RDW: 14.5 % (ref 11.5–15.5)
WBC: 17.7 10*3/uL — ABNORMAL HIGH (ref 4.0–10.5)
nRBC: 0 % (ref 0.0–0.2)

## 2019-06-24 LAB — PROTIME-INR
INR: 1.5 — ABNORMAL HIGH (ref 0.8–1.2)
Prothrombin Time: 17.5 seconds — ABNORMAL HIGH (ref 11.4–15.2)

## 2019-06-24 LAB — APTT: aPTT: 30 seconds (ref 24–36)

## 2019-06-24 LAB — MAGNESIUM: Magnesium: 1.2 mg/dL — ABNORMAL LOW (ref 1.7–2.4)

## 2019-06-24 LAB — BILIRUBIN, DIRECT: Bilirubin, Direct: 1.4 mg/dL — ABNORMAL HIGH (ref 0.0–0.2)

## 2019-06-24 LAB — PHOSPHORUS: Phosphorus: 2.4 mg/dL — ABNORMAL LOW (ref 2.5–4.6)

## 2019-06-24 MED ORDER — MAGNESIUM SULFATE 2 GM/50ML IV SOLN: 2.0000 g | Freq: Once | INTRAVENOUS | Status: DC

## 2019-06-24 MED ORDER — TAMSULOSIN HCL 0.4 MG PO CAPS
0.4000 mg | ORAL_CAPSULE | Freq: Every day | ORAL | Status: DC
  Administered 2019-06-24 – 2019-06-29 (×6): 0.4 mg via ORAL
  Filled 2019-06-24 (×6): qty 1

## 2019-06-24 MED ORDER — SODIUM CHLORIDE 0.9 % IV SOLN
INTRAVENOUS | Status: DC
  Administered 2019-06-24 – 2019-06-26 (×3): via INTRAVENOUS

## 2019-06-24 MED ORDER — INSULIN ASPART 100 UNIT/ML ~~LOC~~ SOLN: 0.0000 [IU] | Freq: Every day | SUBCUTANEOUS | Status: DC

## 2019-06-24 MED ORDER — MAGNESIUM SULFATE 4 GM/100ML IV SOLN
4.0000 g | Freq: Once | INTRAVENOUS | Status: AC
  Administered 2019-06-24: 4 g via INTRAVENOUS
  Filled 2019-06-24: qty 100

## 2019-06-24 MED ORDER — GADOBUTROL 1 MMOL/ML IV SOLN
7.0000 mL | Freq: Once | INTRAVENOUS | Status: AC | PRN
  Administered 2019-06-24: 7 mL via INTRAVENOUS

## 2019-06-24 MED ORDER — INSULIN ASPART 100 UNIT/ML ~~LOC~~ SOLN
0.0000 [IU] | Freq: Three times a day (TID) | SUBCUTANEOUS | Status: DC
  Administered 2019-06-24 – 2019-06-27 (×4): 1 [IU] via SUBCUTANEOUS

## 2019-06-24 MED ORDER — FINASTERIDE 5 MG PO TABS
5.0000 mg | ORAL_TABLET | Freq: Every day | ORAL | Status: DC
  Administered 2019-06-24 – 2019-06-29 (×6): 5 mg via ORAL
  Filled 2019-06-24 (×6): qty 1

## 2019-06-24 MED ORDER — POTASSIUM PHOSPHATES 15 MMOLE/5ML IV SOLN
30.0000 mmol | Freq: Once | INTRAVENOUS | Status: AC
  Administered 2019-06-24: 30 mmol via INTRAVENOUS
  Filled 2019-06-24: qty 10

## 2019-06-24 MED ORDER — ENOXAPARIN SODIUM 40 MG/0.4ML ~~LOC~~ SOLN
40.0000 mg | SUBCUTANEOUS | Status: DC
  Administered 2019-06-24: 40 mg via SUBCUTANEOUS
  Filled 2019-06-24: qty 0.4

## 2019-06-24 NOTE — ED Notes (Signed)
ED TO INPATIENT HANDOFF REPORT  ED Nurse Name and Phone #: [phone] Threasa Beards, RN  S Name/Age/Gender Ricardo Washington 84 y.o. male Room/Bed: 034C/034C  Code Status   Code Status: Prior  Home/SNF/Other Home Patient oriented to: self, place and situation Is this baseline? Yes   Triage Complete: Triage complete  Chief Complaint Chest Pain/Back Pain  Triage Note Pt arrives via EMS from home with reports of back pain. Endorses constipation x1 day. Hx of dementia and tremors. 324 ASA and 4 mg zofran given by EMS.   Allergies Allergies  Allergen Reactions  . Sulfonamide Derivatives     Unknown, per pt and "cant stand it"    Level of Care/Admitting Diagnosis ED Disposition    ED Disposition Condition [place]: [place] [[id number]]  Level of Care: Med-Surg [16]  I expect the patient will be discharged within 24 hours: No (not a candidate for 5C-Observation unit)  Covid Evaluation: Asymptomatic Screening Protocol (No Symptoms)  Diagnosis: Biliary sepsis [008676]  Admitting Physician: Bonnell Public [[id number]]  Attending Physician: Dana Allan I [[id number]]  PT Class (Do Not Modify): Observation [104]  PT Acc Code (Do Not Modify): Observation [10022]       B Medical/Surgery History Past Medical History:  Diagnosis Date  . BPH with obstruction/lower urinary tract symptoms 10/27/2015   Dr. Karsten Ro  . Cataract   . Colon wall thickening 04/2018   "mass-like" per radiologist interpretation; colonoscopy as next step---f/u colonoscopy showed 'tics but o/w normal.  NO further colonoscopies needed due to age.  . Coronary artery disease    with preserved LV function.  . Dementia ([place])   . Diabetes mellitus without complication ([place])   . Diverticulosis of colon    severe, entire colon  . Dysphagia 11/2018   Barium swallow: mild esophageal dysmotility.  . Ectatic abdominal aorta ([place]) 01/2017   Abd u/s: 2.9 cm aortic ectasia--at risk  for aneurism development.  Recheck aortic u/s 5 yrs.  . Erectile dysfunction due to arterial insufficiency   . Fatigue   . Gout    always 2nd toe L foot (uric acid 6.20 Dec 2014 per old records)  . Hepatic steatosis   . History of adenomatous polyp of colon 2002  . History of stomach ulcers   . Hyperlipidemia   . Hypertension   . Hypogonadism male   . Klebsiella sepsis ([place]) 01/2017   due to acute biliary tract infection (no stones) and acute diverticulitis.  . Liver mass 04/2018   GI ref: MRI abd-->?early cholangiocarcinoma-->tissue dx vs PET, vs repeat MRI 3 mo.  GI recommended PET-->results suggestive of cholangiocarcinoma.  Not amenable to bx by IR or GI.  Poss retry by Dr. [name] in GI--pt decl 06/2018.Marland Kitchen  PET by Onc 08/20/18-->focus of activity centrally within liver remains but is slightly less focal and intense, no new liver lesions, no mets.  Repeat PET 02/2019.  . Macrocytic anemia 01/2017   vit B12 borderline low and iron borderline low: checking hemoccults and starting vit B12 PO and iron PO as of 02/11/17.  Vit B12 and folate normal as of GI f/u 06/2018.  . Myogenic ptosis of bilateral eyelids 2018   Plastic surgery in [place], [place] to do surg as of 03/2017.  Marland Kitchen NASH (nonalcoholic steatohepatitis) 06/2017   LFTs up, abd u/s showed fatty liver but no other abnormality.  . Osteoarthritis of right shoulder 09/2018   Near end-stage --->glenohumeral joint-->intra-articular steroid injection 09/2018 (Dr.  Delilah Shan).  . Osteoarthritis of right shoulder    11/2018-->end stage, but not ready for total shoulder replacement.  . Past use of tobacco    quit 1984  . Rectal bleeding   . Rosacea   . S/P coronary artery bypass graft x 3   . Urine incontinence    Dr. Karsten Ro   Past Surgical History:  Procedure Laterality Date  . CARDIOVASCULAR STRESS TEST  08/28/2016   Low risk myoview, normal EF, no ischemia.  Marland Kitchen CATARACT EXTRACTION, BILATERAL    . COLONOSCOPY  2002, 2006, 02/25/2009; 06/11/18   Polyp  x 1 2002, none 2006 or 2010.  Adenomatous polyp 06/11/18+  signif diverticulosis.  NO FURTHER COLONOSCOPIES DUE TO AGE.  Marland Kitchen CORONARY ARTERY BYPASS GRAFT  06/2009   descending,saphenous vein graft to first obtuse marginal, sequential saphenous vein graft to posterior descending and posterolateral  . Endoscopic vein harvest right thigh    . IR RADIOLOGIST EVAL & MGMT  06/12/2018  . IR RADIOLOGIST EVAL & MGMT  09/18/2018  . IR RADIOLOGIST EVAL & MGMT  05/26/2019  . PTCA    . TONSILLECTOMY    . UMBILICAL HERNIA REPAIR     2009  . VASECTOMY       A IV Location/Drains/Wounds Patient Lines/Drains/Airways Status   Active Line/Drains/Airways    Name:   Placement date:   Placement time:   Site:   Days:   Peripheral IV 06/23/19 Left Wrist   06/23/19    1656    Wrist   1   Peripheral IV 06/23/19 Right Forearm   06/23/19    1657    Forearm   1   Peripheral IV 06/23/19 Right Antecubital   06/23/19    1856    Antecubital   1          Intake/Output Last 24 hours  Intake/Output Summary (Last 24 hours) at 06/24/2019 0057 Last data filed at 06/23/2019 2130 Gross per 24 hour  Intake -  Output 400 ml  Net -400 ml    Labs/Imaging Results for orders placed or performed during the hospital encounter of 06/23/19 (from the past 48 hour(s))  CBC     Status: Abnormal   Collection Time: 06/23/19  5:17 PM  Result Value Ref Range   WBC 7.6 4.0 - 10.5 K/uL   RBC 4.14 (L) 4.22 - 5.81 MIL/uL   Hemoglobin 14.4 13.0 - 17.0 g/dL   HCT 42.9 39.0 - 52.0 %   MCV 103.6 (H) 80.0 - 100.0 fL   MCH 34.8 (H) 26.0 - 34.0 pg   MCHC 33.6 30.0 - 36.0 g/dL   RDW 14.2 11.5 - 15.5 %   Platelets 134 (L) 150 - 400 K/uL   nRBC 0.0 0.0 - 0.2 %    Comment: Performed at [facility], [address], [place], [place] [address]  Troponin I (High Sensitivity)     Status: None   Collection Time: 06/23/19  5:17 PM  Result Value Ref Range   Troponin I (High Sensitivity) 5 <18 ng/L    Comment: (NOTE) Elevated high sensitivity  troponin I (hsTnI) values and significant  changes across serial measurements may suggest ACS but many other  chronic and acute conditions are known to elevate hsTnI results.  Refer to the "Links" section for chest pain algorithms and additional  guidance. Performed at [facility], [place] [address], [place], [place] [address]   Comprehensive metabolic panel     Status: Abnormal  Collection Time: 06/23/19  5:17 PM  Result Value Ref Range   Sodium 139 135 - 145 mmol/L   Potassium 3.7 3.5 - 5.1 mmol/L   Chloride 107 98 - 111 mmol/L   CO2 19 (L) 22 - 32 mmol/L   Glucose, Bld 162 (H) 70 - 99 mg/dL   BUN 15 8 - 23 mg/dL   Creatinine, Ser 1.17 0.61 - 1.24 mg/dL   Calcium 8.5 (L) 8.9 - 10.3 mg/dL   Total Protein 6.1 (L) 6.5 - 8.1 g/dL   Albumin 3.4 (L) 3.5 - 5.0 g/dL   AST 1,061 (H) 15 - 41 U/L   ALT 463 (H) 0 - 44 U/L   Alkaline Phosphatase 256 (H) 38 - 126 U/L   Total Bilirubin 2.0 (H) 0.3 - 1.2 mg/dL   GFR calc non Af Amer 58 (L) >60 mL/min   GFR calc Af Amer >60 >60 mL/min   Anion gap 13 5 - 15    Comment: Performed at [facility], [place] [address] [place], [place] [address]  Lipase, blood     Status: None   Collection Time: 06/23/19  5:17 PM  Result Value Ref Range   Lipase 22 11 - 51 U/L    Comment: Performed at [facility] [address] [place], [place] [address]  Troponin I (High Sensitivity)     Status: None   Collection Time: 06/23/19  6:27 PM  Result Value Ref Range   Troponin I (High Sensitivity) 7 <18 ng/L    Comment: (NOTE) Elevated high sensitivity troponin I (hsTnI) values and significant  changes across serial measurements may suggest ACS but many other  chronic and acute conditions are known to elevate hsTnI results.  Refer to the "Links" section for chest pain algorithms and additional  guidance. Performed at [facility], [place] [address], [place], [place] [address]   Bilirubin, direct     Status: Abnormal   Collection Time:  06/23/19  8:57 PM  Result Value Ref Range   Bilirubin, Direct 1.4 (H) 0.0 - 0.2 mg/dL    Comment: Performed at [facility] [address] [place], [place] [address]  SARS Coronavirus 2 [facility] order, Performed in [facility] hospital lab) Nasopharyngeal Nasopharyngeal Swab     Status: None   Collection Time: 06/23/19  9:56 PM   Specimen: Nasopharyngeal Swab  Result Value Ref Range   SARS Coronavirus 2 NEGATIVE NEGATIVE    Comment: (NOTE) If result is NEGATIVE SARS-CoV-2 target nucleic acids are NOT DETECTED. The SARS-CoV-2 RNA is generally detectable in upper and lower  respiratory specimens during the acute phase of infection. The lowest  concentration of SARS-CoV-2 viral copies this assay can detect is 250  copies / mL. A negative result does not preclude SARS-CoV-2 infection  and should not be used as the sole basis for treatment or other  patient management decisions.  A negative result may occur with  improper specimen collection / handling, submission of specimen other  than nasopharyngeal swab, presence of viral mutation(s) within the  areas targeted by this assay, and inadequate number of viral copies  (<250 copies / mL). A negative result must be combined with clinical  observations, patient history, and epidemiological information. If result is POSITIVE SARS-CoV-2 target nucleic acids are DETECTED. The SARS-CoV-2 RNA is generally detectable in upper and lower  respiratory specimens dur ing the acute phase of infection.  Positive  results are indicative of active infection with  SARS-CoV-2.  Clinical  correlation with patient history and other diagnostic information is  necessary to determine patient infection status.  Positive results do  not rule out bacterial infection or co-infection with other viruses. If result is PRESUMPTIVE POSTIVE SARS-CoV-2 nucleic acids MAY BE PRESENT.   A presumptive positive result was obtained on the submitted specimen  and  confirmed on repeat testing.  While 2019 novel coronavirus  (SARS-CoV-2) nucleic acids may be present in the submitted sample  additional confirmatory testing may be necessary for epidemiological  and / or clinical management purposes  to differentiate between  SARS-CoV-2 and other Sarbecovirus currently known to infect humans.  If clinically indicated additional testing with an alternate test  methodology [phone]) is advised. The SARS-CoV-2 RNA is generally  detectable in upper and lower respiratory sp ecimens during the acute  phase of infection. The expected result is Negative. Fact Sheet for Patients:  StrictlyIdeas.no Fact Sheet for Healthcare Providers: BankingDealers.co.za This test is not yet approved or cleared by the [place] FDA and has been authorized for detection and/or diagnosis of SARS-CoV-2 by FDA under an Emergency Use Authorization (EUA).  This EUA will remain in effect (meaning this test can be used) for the duration of the COVID-19 declaration under Section 564(b)(1) of the Act, 21 U.S.C. section 360bbb-3(b)(1), unless the authorization is terminated or revoked sooner. Performed at [facility], [place] [address] [place], [place] [address]   Urinalysis, Routine w reflex microscopic     Status: Abnormal   Collection Time: 06/23/19  9:58 PM  Result Value Ref Range   Color, Urine AMBER (A) YELLOW    Comment: BIOCHEMICALS MAY BE AFFECTED BY COLOR   APPearance CLEAR CLEAR   Specific Gravity, Urine >1.046 (H) 1.005 - 1.030   pH 6.0 5.0 - 8.0   Glucose, UA NEGATIVE NEGATIVE mg/dL   Hgb urine dipstick NEGATIVE NEGATIVE   Bilirubin Urine NEGATIVE NEGATIVE   Ketones, ur 5 (A) NEGATIVE mg/dL   Protein, ur 30 (A) NEGATIVE mg/dL   Nitrite NEGATIVE NEGATIVE   Leukocytes,Ua NEGATIVE NEGATIVE   RBC / HPF 0-5 0 - 5 RBC/hpf   WBC, UA 0-5 0 - 5 WBC/hpf   Bacteria, UA NONE SEEN NONE SEEN   Mucus PRESENT     Comment:  Performed at [facility], [place] [address], [place], [place] [address]   Dg Chest Port 1 View  Result Date: 06/23/2019 CLINICAL DATA:  Chest pain, back pain, constipation for 1 day EXAM: PORTABLE CHEST 1 VIEW COMPARISON:  Radiograph May 05, 2011 FINDINGS: Postsurgical changes related to prior CABG including intact and aligned sternotomy wires and multiple surgical clips projecting over the mediastinum. Cardiac silhouette is borderline enlarged linear opacities in the right lung base likely reflect subsegmental atelectasis no consolidation, features of edema, pneumothorax, or effusion. Pulmonary vascularity is normally distributed. No acute osseous or soft tissue abnormality. Degenerative changes are present in the and imaged spine and shoulders. Cardiac leads overlie the chest. IMPRESSION: No acute cardiopulmonary abnormality Electronically Signed   By: Lovena Le M.D.   On: 06/23/2019 17:04   Ct Angio Chest/abd/pel For Dissection W And/or Wo Contrast  Result Date: 06/23/2019 CLINICAL DATA:  Chest and back pain for 1 day, no known injury, initial encounter EXAM: CT ANGIOGRAPHY CHEST, ABDOMEN AND PELVIS TECHNIQUE: Multidetector CT imaging through the chest, abdomen and pelvis was performed using the standard protocol during bolus administration of intravenous contrast. Multiplanar reconstructed images and MIPs were obtained and reviewed  to evaluate the vascular anatomy. CONTRAST:  134m OMNIPAQUE IOHEXOL 350 MG/ML SOLN COMPARISON:  None. FINDINGS: CTA CHEST FINDINGS Cardiovascular: Thoracic aorta demonstrates bovine branching anatomy. Mild atherosclerotic changes are seen without aneurysmal dilatation or dissection. Changes of prior coronary bypass grafting are seen. No cardiac enlargement is noted. Pulmonary artery is prominent and shows no definitive central pulmonary embolus although timing was not performed for embolus evaluation. Coronary calcifications are noted. No pericardial effusion is noted.  Mediastinum/Nodes: Thoracic inlet is within normal limits. No hilar or mediastinal adenopathy is noted. No mediastinal hematoma is seen. The esophagus as visualized is within normal limits. Lungs/Pleura: The lungs are well aerated bilaterally. No focal infiltrate or sizable effusion is seen. Mild bibasilar atelectatic changes are noted. No sizable parenchymal nodules are seen. Musculoskeletal: Degenerative changes of the thoracic spine are noted. No acute rib abnormality is seen. Prior median sternotomy is noted. Review of the MIP images confirms the above findings. CTA ABDOMEN AND PELVIS FINDINGS VASCULAR Aorta: The abdominal aorta demonstrates atherosclerotic calcifications without aneurysmal dilatation or focal dissection. No extravasation is seen. Celiac: Calcifications are noted at the origin of the celiac axis although no focal stenosis is noted. SMA: Calcifications are noted at the origin of the superior mesenteric artery although no focal stenosis is seen. Renals: Single renal arteries are identified bilaterally with mild narrowing at the origins. IMA: Patent without evidence of aneurysm, dissection, vasculitis or significant stenosis. Iliacs: Iliacs are widely patent without aneurysmal dilatation or dissection. Veins: No vein abnormality is noted. Review of the MIP images confirms the above findings. NON-VASCULAR Hepatobiliary: Liver demonstrates changes of mild fatty infiltration. Mild segmental biliary dilatation is again noted similar to that seen on the prior exam. The previously seen central hypodense lesion is again identified but stable. No significant increase in biliary dilatation is noted. The gallbladder is unremarkable. Pancreas: Unremarkable. No pancreatic ductal dilatation or surrounding inflammatory changes. Spleen: Normal in size without focal abnormality. Adrenals/Urinary Tract: Adrenal glands are within normal limits. Kidneys demonstrate a normal enhancement pattern bilaterally. No  obstructive changes are seen. The bladder is well distended. Stomach/Bowel: Diverticular change of the colon is noted. Diffuse wall thickening is noted within the rectosigmoid region consistent with focal colitis. No perforation or abscess is noted. No significant pericolonic inflammatory changes are seen. The appendix is well visualized and within normal limits. No small bowel or gastric abnormality is noted. Lymphatic: No significant lymphadenopathy is noted. Reproductive: Prostate is unremarkable. Other: No abdominal wall hernia or abnormality. No abdominopelvic ascites. Musculoskeletal: Degenerative changes of lumbar spine are noted. No compression deformity is seen. Review of the MIP images confirms the above findings. IMPRESSION: No evidence of aneurysmal dilatation or dissection within the thoracic and abdominal aorta. Stable segmental biliary dilatation within segment 8 of the liver stable from the prior exam. Mild decreased attenuation is noted centrally similar to that seen on prior MRI. No significant enlargement is noted. Wall thickening within the rectosigmoid consistent with focal colitis. No abscess or perforation is noted. Chronic changes as described above. Electronically Signed   By: MInez CatalinaM.D.   On: 06/23/2019 19:54    Pending Labs Unresulted Labs (From admission, onward)    Start     Ordered   06/23/19 2058  Blood culture (routine x 2)  BLOOD CULTURE X 2,   STAT     06/23/19 2057   06/23/19 2057  Cancer antigen 19-9  Once,   STAT     06/23/19 2056   Signed and Held  CBC  (enoxaparin (LOVENOX)    CrCl >/= 30 ml/min)  Once,   R    Comments: Baseline for enoxaparin therapy IF NOT ALREADY DRAWN.  Notify MD if PLT < 100 K.    Signed and Held   Signed and Held  Creatinine, serum  (enoxaparin (LOVENOX)    CrCl >/= 30 ml/min)  Once,   R    Comments: Baseline for enoxaparin therapy IF NOT ALREADY DRAWN.    Signed and Held   Signed and Held  Creatinine, serum  (enoxaparin (LOVENOX)     CrCl >/= 30 ml/min)  Weekly,   R    Comments: while on enoxaparin therapy    Signed and Held   Signed and Held  Magnesium  Once,   R     Signed and Held   Signed and Held  Phosphorus  Once,   R     Signed and Held   Signed and Held  APTT  Once,   R     Signed and Held   Signed and Held  Protime-INR  Once,   R     Signed and Held   Signed and Held  Urinalysis, Complete w Microscopic  Once,   R     Signed and Held   Signed and Held  Comprehensive metabolic panel  Tomorrow morning,   R     Signed and Held   Signed and Held  CBC  Tomorrow morning,   R     Signed and Held   Signed and Held  Bilirubin, direct  Tomorrow morning,   R     Signed and Held          Vitals/Pain Today's Vitals   06/23/19 2315 06/23/19 2330 06/23/19 2345 06/24/19 0045  BP: (!) 98/48 (!) 99/46 (!) 107/48   Pulse:      Resp: 19 15 15    Temp:      TempSrc:      SpO2:      Weight:    77.4 kg  PainSc:        Isolation Precautions No active isolations  Medications Medications  piperacillin-tazobactam (ZOSYN) IVPB 3.375 g (3.375 g Intravenous New Bag/Given 06/23/19 2318)  morphine 4 MG/ML injection 4 mg (4 mg Intravenous Given 06/23/19 1704)  iohexol (OMNIPAQUE) 350 MG/ML injection 100 mL (100 mLs Intravenous Contrast Given 06/23/19 1937)  ceFEPIme (MAXIPIME) 2 g in sodium chloride 0.9 % 100 mL IVPB (0 g Intravenous Stopped 06/23/19 2220)  metroNIDAZOLE (FLAGYL) IVPB 500 mg (0 mg Intravenous Stopped 06/23/19 2238)  sodium chloride 0.9 % bolus 1,000 mL (0 mLs Intravenous Stopped 06/23/19 2305)  sodium chloride 0.9 % bolus 1,000 mL (1,000 mLs Intravenous New Bag/Given 06/23/19 2320)    Mobility walks with person assist High fall risk   Focused Assessments GI   R Recommendations: See Admitting Provider Note  Report given to:   Additional Notes:

## 2019-06-24 NOTE — Progress Notes (Signed)
[facility] PROGRESS NOTE  Ricardo Washington PQD:[id number] DOB: [birth year] DOA: 06/23/2019 PCP: [name], MD  Assessment/Plan:  Possible biliary sepsis: afebrile, BP low end of normal, not tachycardic or tachypnea. Wbc 17. Blood cultures pending CT with wall thickening witin the rectosigmoid consistent with focal colitis. Zosyn started.  -continue Zosyn) -follow blood cultures -clear liquids.  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 No tissue diagnosis associated due to inability to obtain tissue sample. GI is managing. -await GI recs  Abnormal LFTs: Likely related to above. Continue to trend up this am. Total bili 2.5 Hepatitis panel in process. -recheck in am -await GI  Dementia: spoke to wife and he is stable at baseline No behavioral problems  Diabetes mellitus: home meds include glucophage. HgA1c 6.1 last month -hold oral agents for now -Sliding scale insulin.  Hypertension. Home meds include lisinopril, lopressor. BP soft this am -will hold antihypertesive meds for now  Hypomagnesemia. Mag level 1.2 -replete   Code Status: full Family Communication: wife Disposition Plan: home when ready hopefully tomorrow   Consultants:  [facility]  Procedures:    Antibiotics:  Zosyn 8/4>>  HPI/Subjective: Awake alert oriented to self only. Denies pain and wishes to go home    Objective: Vitals:   06/24/19 0815 06/24/19 1122  BP: (!) 99/51 (!) 94/46  Pulse: 74 72  Resp: 16 14  Temp: 98.1 F (36.7 C) 98 F (36.7 C)  SpO2: 98% 98%    Intake/Output Summary (Last 24 hours) at 06/24/2019 1151 Last data filed at 06/24/2019 0914 Gross per 24 hour  Intake 463.56 ml  Output 800 ml  Net -336.44 ml   Filed Weights   06/24/19 0045 06/24/19 0141  Weight: 77.4 kg 75 kg    Exam:   General:  Somewhat demented alert cooperative no acute distress  Cardiovascular: rrr no mgr no LE edema  Respiratory: normal effort BS clear  bilaterally no wheeze  Abdomen: non-distended non-tender +BS   Musculoskeletal: joints without swelling/erythema   Data Reviewed: Basic Metabolic Panel: Recent Labs  Lab 06/23/19 1717 06/24/19 0223  NA 139 140  K 3.7 3.4*  CL 107 106  CO2 19* 22  GLUCOSE 162* 122*  BUN 15 15  CREATININE 1.17 1.30*  CALCIUM 8.5* 7.9*  MG  --  1.2*  PHOS  --  2.4*   Liver Function Tests: Recent Labs  Lab 06/23/19 1717 06/24/19 0223  AST 1,061* 1,075*  ALT 463* 675*  ALKPHOS 256* 255*  BILITOT 2.0* 2.5*  PROT 6.1* 5.6*  ALBUMIN 3.4* 2.9*   Recent Labs  Lab 06/23/19 1717  LIPASE 22   No results for input(s): AMMONIA in the last 168 hours. CBC: Recent Labs  Lab 06/23/19 1717 06/24/19 0223  WBC 7.6 17.7*  HGB 14.4 12.5*  HCT 42.9 37.3*  MCV 103.6* 102.2*  PLT 134* 146*   Cardiac Enzymes: No results for input(s): CKTOTAL, CKMB, CKMBINDEX, TROPONINI in the last 168 hours. BNP (last 3 results) No results for input(s): BNP in the last 8760 hours.  ProBNP (last 3 results) No results for input(s): PROBNP in the last 8760 hours.  CBG: Recent Labs  Lab 06/24/19 0157 06/24/19 0642 06/24/19 1121  GLUCAP 107* 94 130*    Recent Results (from the past 240 hour(s))  SARS Coronavirus 2 [facility] order, Performed in [facility] hospital lab) Nasopharyngeal Nasopharyngeal Swab     Status: None   Collection Time: 06/23/19  9:56 PM   Specimen: Nasopharyngeal Swab  Result  Value Ref Range Status   SARS Coronavirus 2 NEGATIVE NEGATIVE Final    Comment: (NOTE) If result is NEGATIVE SARS-CoV-2 target nucleic acids are NOT DETECTED. The SARS-CoV-2 RNA is generally detectable in upper and lower  respiratory specimens during the acute phase of infection. The lowest  concentration of SARS-CoV-2 viral copies this assay can detect is 250  copies / mL. A negative result does not preclude SARS-CoV-2 infection  and should not be used as the sole basis for treatment or other  patient  management decisions.  A negative result may occur with  improper specimen collection / handling, submission of specimen other  than nasopharyngeal swab, presence of viral mutation(s) within the  areas targeted by this assay, and inadequate number of viral copies  (<250 copies / mL). A negative result must be combined with clinical  observations, patient history, and epidemiological information. If result is POSITIVE SARS-CoV-2 target nucleic acids are DETECTED. The SARS-CoV-2 RNA is generally detectable in upper and lower  respiratory specimens dur ing the acute phase of infection.  Positive  results are indicative of active infection with SARS-CoV-2.  Clinical  correlation with patient history and other diagnostic information is  necessary to determine patient infection status.  Positive results do  not rule out bacterial infection or co-infection with other viruses. If result is PRESUMPTIVE POSTIVE SARS-CoV-2 nucleic acids MAY BE PRESENT.   A presumptive positive result was obtained on the submitted specimen  and confirmed on repeat testing.  While 2019 novel coronavirus  (SARS-CoV-2) nucleic acids may be present in the submitted sample  additional confirmatory testing may be necessary for epidemiological  and / or clinical management purposes  to differentiate between  SARS-CoV-2 and other Sarbecovirus currently known to infect humans.  If clinically indicated additional testing with an alternate test  methodology [phone]) is advised. The SARS-CoV-2 RNA is generally  detectable in upper and lower respiratory sp ecimens during the acute  phase of infection. The expected result is Negative. Fact Sheet for Patients:  StrictlyIdeas.no Fact Sheet for Healthcare Providers: BankingDealers.co.za This test is not yet approved or cleared by the [place] FDA and has been authorized for detection and/or diagnosis of SARS-CoV-2 by FDA under  an Emergency Use Authorization (EUA).  This EUA will remain in effect (meaning this test can be used) for the duration of the COVID-19 declaration under Section 564(b)(1) of the Act, 21 U.S.C. section 360bbb-3(b)(1), unless the authorization is terminated or revoked sooner. Performed at [facility], [place] [address] [place], [place] [address]      Studies: Dg Chest Port 1 View  Result Date: 06/23/2019 CLINICAL DATA:  Chest pain, back pain, constipation for 1 day EXAM: PORTABLE CHEST 1 VIEW COMPARISON:  Radiograph May 05, 2011 FINDINGS: Postsurgical changes related to prior CABG including intact and aligned sternotomy wires and multiple surgical clips projecting over the mediastinum. Cardiac silhouette is borderline enlarged linear opacities in the right lung base likely reflect subsegmental atelectasis no consolidation, features of edema, pneumothorax, or effusion. Pulmonary vascularity is normally distributed. No acute osseous or soft tissue abnormality. Degenerative changes are present in the and imaged spine and shoulders. Cardiac leads overlie the chest. IMPRESSION: No acute cardiopulmonary abnormality Electronically Signed   By: Lovena Le M.D.   On: 06/23/2019 17:04   Ct Angio Chest/abd/pel For Dissection W And/or Wo Contrast  Result Date: 06/23/2019 CLINICAL DATA:  Chest and back pain for 1 day, no known injury, initial encounter EXAM: CT ANGIOGRAPHY CHEST,  ABDOMEN AND PELVIS TECHNIQUE: Multidetector CT imaging through the chest, abdomen and pelvis was performed using the standard protocol during bolus administration of intravenous contrast. Multiplanar reconstructed images and MIPs were obtained and reviewed to evaluate the vascular anatomy. CONTRAST:  118m OMNIPAQUE IOHEXOL 350 MG/ML SOLN COMPARISON:  None. FINDINGS: CTA CHEST FINDINGS Cardiovascular: Thoracic aorta demonstrates bovine branching anatomy. Mild atherosclerotic changes are seen without aneurysmal dilatation or  dissection. Changes of prior coronary bypass grafting are seen. No cardiac enlargement is noted. Pulmonary artery is prominent and shows no definitive central pulmonary embolus although timing was not performed for embolus evaluation. Coronary calcifications are noted. No pericardial effusion is noted. Mediastinum/Nodes: Thoracic inlet is within normal limits. No hilar or mediastinal adenopathy is noted. No mediastinal hematoma is seen. The esophagus as visualized is within normal limits. Lungs/Pleura: The lungs are well aerated bilaterally. No focal infiltrate or sizable effusion is seen. Mild bibasilar atelectatic changes are noted. No sizable parenchymal nodules are seen. Musculoskeletal: Degenerative changes of the thoracic spine are noted. No acute rib abnormality is seen. Prior median sternotomy is noted. Review of the MIP images confirms the above findings. CTA ABDOMEN AND PELVIS FINDINGS VASCULAR Aorta: The abdominal aorta demonstrates atherosclerotic calcifications without aneurysmal dilatation or focal dissection. No extravasation is seen. Celiac: Calcifications are noted at the origin of the celiac axis although no focal stenosis is noted. SMA: Calcifications are noted at the origin of the superior mesenteric artery although no focal stenosis is seen. Renals: Single renal arteries are identified bilaterally with mild narrowing at the origins. IMA: Patent without evidence of aneurysm, dissection, vasculitis or significant stenosis. Iliacs: Iliacs are widely patent without aneurysmal dilatation or dissection. Veins: No vein abnormality is noted. Review of the MIP images confirms the above findings. NON-VASCULAR Hepatobiliary: Liver demonstrates changes of mild fatty infiltration. Mild segmental biliary dilatation is again noted similar to that seen on the prior exam. The previously seen central hypodense lesion is again identified but stable. No significant increase in biliary dilatation is noted. The  gallbladder is unremarkable. Pancreas: Unremarkable. No pancreatic ductal dilatation or surrounding inflammatory changes. Spleen: Normal in size without focal abnormality. Adrenals/Urinary Tract: Adrenal glands are within normal limits. Kidneys demonstrate a normal enhancement pattern bilaterally. No obstructive changes are seen. The bladder is well distended. Stomach/Bowel: Diverticular change of the colon is noted. Diffuse wall thickening is noted within the rectosigmoid region consistent with focal colitis. No perforation or abscess is noted. No significant pericolonic inflammatory changes are seen. The appendix is well visualized and within normal limits. No small bowel or gastric abnormality is noted. Lymphatic: No significant lymphadenopathy is noted. Reproductive: Prostate is unremarkable. Other: No abdominal wall hernia or abnormality. No abdominopelvic ascites. Musculoskeletal: Degenerative changes of lumbar spine are noted. No compression deformity is seen. Review of the MIP images confirms the above findings. IMPRESSION: No evidence of aneurysmal dilatation or dissection within the thoracic and abdominal aorta. Stable segmental biliary dilatation within segment 8 of the liver stable from the prior exam. Mild decreased attenuation is noted centrally similar to that seen on prior MRI. No significant enlargement is noted. Wall thickening within the rectosigmoid consistent with focal colitis. No abscess or perforation is noted. Chronic changes as described above. Electronically Signed   By: MInez CatalinaM.D.   On: 06/23/2019 19:54    Scheduled Meds: . enoxaparin (LOVENOX) injection  40 mg Subcutaneous Q24H  . finasteride  5 mg Oral Daily  . insulin aspart  0-5 Units Subcutaneous QHS  .  insulin aspart  0-9 Units Subcutaneous TID WC  . tamsulosin  0.4 mg Oral Daily   Continuous Infusions: . sodium chloride 100 mL/hr at 06/24/19 1005  . piperacillin-tazobactam (ZOSYN)  IV 3.375 g (06/24/19 0602)  .  potassium PHOSPHATE IVPB (in mmol) 30 mmol (06/24/19 1003)    Principal Problem:   Biliary sepsis Active Problems:   Elevated LFTs   Acute kidney injury ([place])   Essential hypertension   Diabetes mellitus, type 2 (HCC)   Hypokalemia   Dementia ([place])    Time spent: 78 minutes    Roland NP  [facility]  If 7PM-7AM, please contact night-coverage at [email or web address], password [facility] 06/24/2019, 11:51 AM  LOS: 0 days

## 2019-06-24 NOTE — Progress Notes (Signed)
PHARMACY - PHYSICIAN COMMUNICATION CRITICAL VALUE ALERT - BLOOD CULTURE IDENTIFICATION (BCID)  Ricardo Washington is an 84 y.o. male who presented to [facility] on 06/23/2019 with a chief complaint of back pain and abdominal pain  Assessment: cholangitis with bacteremia. Blood cultures show E. Coli (no resistance patterns detected) in 1/4 bottles.   Name of physician (or Provider) Contacted: Dr/ Grandville Silos  Current antibiotics: Zosyn  Changes to prescribed antibiotics recommended:  -No changes now.  Plans for ERCP and may de-escalate post procedure  Results for orders placed or performed during the hospital encounter of 06/23/19  Blood Culture ID Panel (Reflexed) (Collected: 06/23/2019  9:03 PM)  Result Value Ref Range   Enterococcus species NOT DETECTED NOT DETECTED   Listeria monocytogenes NOT DETECTED NOT DETECTED   Staphylococcus species NOT DETECTED NOT DETECTED   Staphylococcus aureus (BCID) NOT DETECTED NOT DETECTED   Streptococcus species NOT DETECTED NOT DETECTED   Streptococcus agalactiae NOT DETECTED NOT DETECTED   Streptococcus pneumoniae NOT DETECTED NOT DETECTED   Streptococcus pyogenes NOT DETECTED NOT DETECTED   Acinetobacter baumannii NOT DETECTED NOT DETECTED   Enterobacteriaceae species DETECTED (A) NOT DETECTED   Enterobacter cloacae complex NOT DETECTED NOT DETECTED   Escherichia coli DETECTED (A) NOT DETECTED   Klebsiella oxytoca NOT DETECTED NOT DETECTED   Klebsiella pneumoniae NOT DETECTED NOT DETECTED   Proteus species NOT DETECTED NOT DETECTED   Serratia marcescens NOT DETECTED NOT DETECTED   Carbapenem resistance NOT DETECTED NOT DETECTED   Haemophilus influenzae NOT DETECTED NOT DETECTED   Neisseria meningitidis NOT DETECTED NOT DETECTED   Pseudomonas aeruginosa NOT DETECTED NOT DETECTED   Candida albicans NOT DETECTED NOT DETECTED   Candida glabrata NOT DETECTED NOT DETECTED   Candida krusei NOT DETECTED NOT DETECTED   Candida parapsilosis NOT DETECTED  NOT DETECTED   Candida tropicalis NOT DETECTED NOT DETECTED    Hildred Laser, PharmD Clinical Pharmacist **Pharmacist phone directory can now be found on [email or web address] ([email or web address]).  Listed under [place].

## 2019-06-24 NOTE — Telephone Encounter (Signed)
Noted. Chart reviewed.  Pt admitted for suspected biliary sepsis.

## 2019-06-24 NOTE — Consult Note (Addendum)
Consultation  Referring Provider: [facility] hospitalist/Daniel Grandville Silos, MD  primary Care Physician:  [name], MD Primary Gastroenterologist:  Dr. [name]  Reason for Consultation: Biliary sepsis  HPI: Ricardo Washington is a 84 y.o. male been known to Dr. [name], who has been followed for a central hilar hepatic mass with mild right biliary dilation which has been present, over at least the past year.  He had been asymptomatic, and when initially found was felt to be not amenable to EUS biopsy or IR biopsy.  As patient was asymptomatic and had normal LFTs, and patient preferring conservative management he had been observed.  He was last seen by Dr. [name] in December 2019 with plans to repeat PET scan in April 2020. Patient had the PET scan on 05/26/2019 with finding of focus of activity centrally within the liver remaining but slightly less intense and less focal, no clear evidence of new lesions, no evidence of metastatic disease outside the liver.Marland Kitchen  LFTs were done on 06/05/2019 and completely normal.  Patient apparently developed abdominal pain and back pain yesterday and was brought to the emergency room.  He is not a good historian secondary to history of dementia. On evaluation in the ER with CT angios of the chest abdomen and pelvis found to have mild fatty liver changes, mild segmental biliary dilation, the central hypodense lesion stable in size, gallbladder within normal limits was mild rectosigmoid thickening.. Labs on 06/23/2019 WBC of 7.6, hemoglobin 14 T bili 2/alk phos 252/AST 1061/ALT 463.  Lipase within normal limits.  Patient was admitted and started on IV Zosyn, blood cultures have been done and are positive for gram-negative rods.  Patient has been afebrile, Today WBC up to 17.7, hemoglobin 12.5 T bili 2.5/alk phos 255/AST 1075/ALT 675 INR 1.5. Creatinine up to 1.3  Patient currently denies any abdominal pain, and is not sure why he is here.  He says he feels fine and does  not remember having any abdominal or back pain.  He denies any nausea or vomiting.   Past Medical History:  Diagnosis Date  . BPH with obstruction/lower urinary tract symptoms 10/27/2015   Dr. Karsten Ro  . Cataract   . Colon wall thickening 04/2018   "mass-like" per radiologist interpretation; colonoscopy as next step---f/u colonoscopy showed 'tics but o/w normal.  NO further colonoscopies needed due to age.  . Coronary artery disease    with preserved LV function.  . Dementia ([place])   . Diabetes mellitus without complication ([place])   . Diverticulosis of colon    severe, entire colon  . Dysphagia 11/2018   Barium swallow: mild esophageal dysmotility.  . Ectatic abdominal aorta ([place]) 01/2017   Abd u/s: 2.9 cm aortic ectasia--at risk for aneurism development.  Recheck aortic u/s 5 yrs.  . Erectile dysfunction due to arterial insufficiency   . Fatigue   . Gout    always 2nd toe L foot (uric acid 6.20 Dec 2014 per old records)  . Hepatic steatosis   . History of adenomatous polyp of colon 2002  . History of stomach ulcers   . Hyperlipidemia   . Hypertension   . Hypogonadism male   . Klebsiella sepsis ([place]) 01/2017   due to acute biliary tract infection (no stones) and acute diverticulitis.  . Liver mass 04/2018   GI ref: MRI abd-->?early cholangiocarcinoma-->tissue dx vs PET, vs repeat MRI 3 mo.  GI recommended PET-->results suggestive of cholangiocarcinoma.  Not amenable to bx by IR or GI.  Poss  retry by Dr. [name] in GI--pt decl 06/2018.Marland Kitchen  PET by Onc 08/20/18-->focus of activity centrally within liver remains but is slightly less focal and intense, no new liver lesions, no mets.  Repeat PET 02/2019.  . Macrocytic anemia 01/2017   vit B12 borderline low and iron borderline low: checking hemoccults and starting vit B12 PO and iron PO as of 02/11/17.  Vit B12 and folate normal as of GI f/u 06/2018.  . Myogenic ptosis of bilateral eyelids 2018   Plastic surgery in [place], [place] to do surg as of  03/2017.  Marland Kitchen NASH (nonalcoholic steatohepatitis) 06/2017   LFTs up, abd u/s showed fatty liver but no other abnormality.  . Osteoarthritis of right shoulder 09/2018   Near end-stage --->glenohumeral joint-->intra-articular steroid injection 09/2018 (Dr. Delilah Shan).  . Osteoarthritis of right shoulder    11/2018-->end stage, but not ready for total shoulder replacement.  . Past use of tobacco    quit 1984  . Rectal bleeding   . Rosacea   . S/P coronary artery bypass graft x 3   . Urine incontinence    Dr. Karsten Ro    Past Surgical History:  Procedure Laterality Date  . CARDIOVASCULAR STRESS TEST  08/28/2016   Low risk myoview, normal EF, no ischemia.  Marland Kitchen CATARACT EXTRACTION, BILATERAL    . COLONOSCOPY  2002, 2006, 02/25/2009; 06/11/18   Polyp x 1 2002, none 2006 or 2010.  Adenomatous polyp 06/11/18+  signif diverticulosis.  NO FURTHER COLONOSCOPIES DUE TO AGE.  Marland Kitchen CORONARY ARTERY BYPASS GRAFT  06/2009   descending,saphenous vein graft to first obtuse marginal, sequential saphenous vein graft to posterior descending and posterolateral  . Endoscopic vein harvest right thigh    . IR RADIOLOGIST EVAL & MGMT  06/12/2018  . IR RADIOLOGIST EVAL & MGMT  09/18/2018  . IR RADIOLOGIST EVAL & MGMT  05/26/2019  . PTCA    . TONSILLECTOMY    . UMBILICAL HERNIA REPAIR     2009  . VASECTOMY      Prior to Admission medications   Medication Sig Start Date End Date Taking? Authorizing Provider  allopurinol (ZYLOPRIM) 100 MG tablet TAKE ONE TABLET BY MOUTH ON MONDAY, WEDNESDAY, AND FRIDAY Patient taking differently: Take 100 mg by mouth 3 (three) times a week.  01/26/19  Yes McGowen, Adrian Blackwater, MD  ALPRAZolam (NIRAVAM) 0.25 MG dissolvable tablet Take 0.25 mg by mouth at bedtime as needed for anxiety.   Yes [provider]  aspirin 325 MG tablet Take 325 mg by mouth daily.     Yes [provider]  betamethasone dipropionate (DIPROLENE) 0.05 % cream Apply 1 application topically daily.  12/17/18  Yes  [provider]  ferrous sulfate 325 (65 FE) MG EC tablet Take 325 mg by mouth daily with breakfast.   Yes [provider]  finasteride (PROSCAR) 5 MG tablet TAKE ONE TABLET BY MOUTH EVERY DAY 05/04/19  Yes McGowen, Adrian Blackwater, MD  lisinopril (PRINIVIL,ZESTRIL) 5 MG tablet TAKE ONE TABLET BY MOUTH DAILY Patient taking differently: Take 5 mg by mouth daily.  01/26/19  Yes McGowen, Adrian Blackwater, MD  metFORMIN (GLUCOPHAGE-XR) 500 MG 24 hr tablet TAKE ONE TABLET BY MOUTH TWICE DAILY WITH MEALS Patient taking differently: Take 500 mg by mouth 2 (two) times daily with a meal.  06/22/19  Yes McGowen, Adrian Blackwater, MD  metoprolol tartrate (LOPRESSOR) 25 MG tablet TAKE ONE TABLET BY MOUTH TWICE DAILY Patient taking differently: Take 25 mg by mouth 2 (two) times daily.  06/22/19  Yes McGowen, Adrian Blackwater, MD  MULTIPLE VITAMIN PO Take 1 tablet by mouth daily.    Yes [provider]  omeprazole (PRILOSEC) 40 MG capsule Take 1 capsule (40 mg total) by mouth daily. 05/07/19  Yes McGowen, Adrian Blackwater, MD  PREVIDENT 5000 PLUS 1.1 % CREA dental cream Place 1 application onto teeth at bedtime.  10/27/18  Yes [provider]  rosuvastatin (CRESTOR) 10 MG tablet TAKE ONE TABLET BY MOUTH EVERY DAY Patient taking differently: Take 10 mg by mouth daily.  05/04/19  Yes McGowen, Adrian Blackwater, MD  tamsulosin (FLOMAX) 0.4 MG CAPS capsule TAKE ONE CAPSULE BY MOUTH DAILY 03/09/19  Yes McGowen, Adrian Blackwater, MD  Trospium Chloride 60 MG CP24 TAKE ONE CAPSULE BY MOUTH DAILY 06/22/19  Yes McGowen, Adrian Blackwater, MD  vitamin B-12 (CYANOCOBALAMIN) 1000 MCG tablet Take 1,000 mcg by mouth daily.   Yes [provider]  Lancets (ONETOUCH ULTRASOFT) lancets Use to check blood sugar once daily 04/15/18   McGowen, Adrian Blackwater, MD  nitroGLYCERIN (NITROSTAT) 0.4 MG SL tablet Place 1 tablet (0.4 mg total) under the tongue every 5 (five) minutes as needed for chest pain. Patient not taking: Reported on 05/21/2019 08/20/16 10/20/18  Josue Hector, MD    Current Facility-Administered Medications  Medication Dose Route Frequency Provider Last Rate Last Dose  . 0.9 %  sodium chloride infusion   Intravenous Continuous Radene Gunning, NP 100 mL/hr at 06/24/19 1005    . enoxaparin (LOVENOX) injection 40 mg  40 mg Subcutaneous Q24H Dana Allan I, MD   40 mg at 06/24/19 0231  . finasteride (PROSCAR) tablet 5 mg  5 mg Oral Daily Dana Allan I, MD   5 mg at 06/24/19 1011  . insulin aspart (novoLOG) injection 0-5 Units  0-5 Units Subcutaneous QHS Dana Allan I, MD      . insulin aspart (novoLOG) injection 0-9 Units  0-9 Units Subcutaneous TID WC Dana Allan I, MD   1 Units at 06/24/19 1210  . piperacillin-tazobactam (ZOSYN) IVPB 3.375 g  3.375 g Intravenous Q8H Pierce, Dwayne A, RPH 12.5 mL/hr at 06/24/19 1440 3.375 g at 06/24/19 1440  . potassium PHOSPHATE 30 mmol in dextrose 5 % 500 mL infusion  30 mmol Intravenous Once Eugenie Filler, MD 85 mL/hr at 06/24/19 1003 30 mmol at 06/24/19 1003  . tamsulosin (FLOMAX) capsule 0.4 mg  0.4 mg Oral Daily Dana Allan I, MD   0.4 mg at 06/24/19 1011    Allergies as of 06/23/2019 - Review Complete 06/23/2019  Allergen Reaction Noted  . Sulfonamide derivatives      Family History  Problem Relation Age of Onset  . Cancer Mother   . Cancer Father        pt points to LLQ as  area of cancer, so potentially could have been intestinal.     . Colon cancer Neg Hx   . Esophageal cancer Neg Hx   . Stomach cancer Neg Hx   . Rectal cancer Neg Hx     Social History   Socioeconomic History  . Marital status: Married    Spouse name: Not on file  . Number of children: 3  . Years of education: 85  . Highest education level: Not on file  Occupational History  . Not on file  Social Needs  . Financial resource strain: Not on file  . Food insecurity    Worry: Not on file    Inability: Not on file  .  Transportation needs    Medical: Not on file    Non-medical: Not on  file  Tobacco Use  . Smoking status: Former Smoker    Packs/day: 1.00    Years: 30.00    Pack years: 30.00    Types: Cigarettes    Quit date: 03/15/1983    Years since quitting: 36.3  . Smokeless tobacco: Never Used  . Tobacco comment: Quit 1984  Substance and Sexual Activity  . Alcohol use: Yes    Alcohol/week: 5.0 - 7.0 standard drinks    Types: 5 - 7 Glasses of wine per week    Comment: quit etoh 1999 but in ~ 2017 began having a glass of wine with dinner   . Drug use: No  . Sexual activity: Never  Lifestyle  . Physical activity    Days per week: Not on file    Minutes per session: Not on file  . Stress: Not on file  Relationships  . Social Herbalist on phone: Not on file    Gets together: Not on file    Attends religious service: Not on file    Active member of club or organization: Not on file    Attends meetings of clubs or organizations: Not on file    Relationship status: Not on file  . Intimate partner violence    Fear of current or ex partner: Not on file    Emotionally abused: Not on file    Physically abused: Not on file    Forced sexual activity: Not on file  Other Topics Concern  . Not on file  Social History Narrative   Married 1957, has 3 sons.   [place]. 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    End of life Care: DNR, no prolonged heroic measures or prolonged supportive care.    Former smoker, quit 1984.   Quit alcohol when dx'd with DM in 2009. ~ 2017 began having glass of wine with dinner.   No exercise.    Review of Systems: Unable to offer secondary to dementia  Physical Exam: Vital signs in last 24 hours: Temp:  [97.8 F (36.6 C)-98.2 F (36.8 C)] 98 F (36.7 C) (08/05 1122) Pulse Rate:  [72-103] 72 (08/05 1122) Resp:  [14-32] 14 (08/05 1122) BP: (84-171)/(45-125) 94/46 (08/05 1122) SpO2:  [95 %-100 %] 98 % (08/05 1122) Weight:  [75 kg-77.4 kg] 75 kg (08/05  0141)   General:   Alert,  Well-developed, well-nourished, elderly white male pleasant and cooperative in NAD, disoriented to date and place Head:  Normocephalic and atraumatic. Eyes:  Sclera early icterus   conjunctiva pink. Ears:  Normal auditory acuity. Nose:  No deformity, discharge,  or lesions. Mouth:  No deformity or lesions.   Neck:  Supple; no masses or thyromegaly. Lungs:  Clear throughout to auscultation.   No wheezes, crackles, or rhonchi. Heart:  Regular rate and rhythm; no murmurs, clicks, rubs,  or gallops. Abdomen:  Soft,, bowel sounds are present, he is tender in the right upper quadrant without rebound no definite palpable mass.  He has some firm nodularity around the umbilicus Rectal:  Deferred  Msk:  Symmetrical without gross deformities. . Pulses:  Normal pulses noted. Extremities:  Without clubbing or edema. Neurologic:  Alert oriented to person and place, grossly normal neurologically. Skin:  Intact without significant lesions or rashes.. Psych:  Alert and cooperative. Normal mood and affect.  Intake/Output from previous  day: 08/04 0701 - 08/05 0700 In: 403.6 [P.O.:120; I.V.:247.3; IV Piggyback:36.2] Out: 800 [Urine:800] Intake/Output this shift: Total I/O In: 56 [P.O.:60] Out: -   Lab Results: Recent Labs    06/23/19 1717 06/24/19 0223  WBC 7.6 17.7*  HGB 14.4 12.5*  HCT 42.9 37.3*  PLT 134* 146*   BMET Recent Labs    06/23/19 1717 06/24/19 0223  NA 139 140  K 3.7 3.4*  CL 107 106  CO2 19* 22  GLUCOSE 162* 122*  BUN 15 15  CREATININE 1.17 1.30*  CALCIUM 8.5* 7.9*   LFT Recent Labs    06/24/19 0223  PROT 5.6*  ALBUMIN 2.9*  AST 1,075*  ALT 675*  ALKPHOS 255*  BILITOT 2.5*  BILIDIR 1.4*   PT/INR Recent Labs    06/24/19 0223  LABPROT 17.5*  INR 1.5*      IMPRESSION:  #53 84 year old white male who has had a 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  And follow-up PET scan July 2020 with lesion felt to be stable in size, some slight capsular retraction in segment 8.  Patient currently presents with abdominal pain and back pain, new onset and noted to have markedly elevated LFTs. CT imaging yesterday with mild segmental biliary dilation and stable hypodense central hepatic lesion, normal gallbladder and no evidence for metastatic disease  Blood cultures growing gram-negative rods today, previously normal WBC up to 17.7 today.  Patient is afebrile and hemodynamically stable  Picture consistent with cholangitis with bacteremia  #2 dementia #3 history of coronary artery disease 4.  Adult onset diabetes mellitus 5.  [place] 6.  History of diverticulosis and colon polyps   PLAN: #1 continue IV Zosyn #2 gentle IV fluids, watch creatinine #3 have scheduled patient for stat MRI/MRCP today to further delineate biliary tree and presumed cholangio-CA Patient will need ERCP, and stenting which likely will be done per Dr. Rush Landmark, who has reviewed imaging.  This will tentatively be scheduled for Friday.  Will monitor for signs of overt sepsis at which point would need to be done more urgently  #4 patient being covered with Lovenox, will need to hold prior to ERCP   Taiyana Kissler Osgood PA-C 06/24/2019, 2:47 PM

## 2019-06-24 NOTE — Progress Notes (Signed)
PHARMACY - PHYSICIAN COMMUNICATION CRITICAL VALUE ALERT - BLOOD CULTURE IDENTIFICATION (BCID)  Ricardo Washington is an 84 y.o. male who presented to [facility] on 06/23/2019 with a chief complaint of back and abdominal pain  Assessment:  Staph species in 1/4 bottles, no methicillin resistant detected, likely contaminant  Name of physician (or Provider) Contacted: Tylene Fantasia  Current antibiotics: zosyn  Changes to prescribed antibiotics recommended:  none  Results for orders placed or performed during the hospital encounter of 06/23/19  Blood Culture ID Panel (Reflexed) (Collected: 06/23/2019  9:03 PM)  Result Value Ref Range   Enterococcus species NOT DETECTED NOT DETECTED   Listeria monocytogenes NOT DETECTED NOT DETECTED   Staphylococcus species DETECTED (A) NOT DETECTED   Staphylococcus aureus (BCID) NOT DETECTED NOT DETECTED   Methicillin resistance NOT DETECTED NOT DETECTED   Streptococcus species NOT DETECTED NOT DETECTED   Streptococcus agalactiae NOT DETECTED NOT DETECTED   Streptococcus pneumoniae NOT DETECTED NOT DETECTED   Streptococcus pyogenes NOT DETECTED NOT DETECTED   Acinetobacter baumannii NOT DETECTED NOT DETECTED   Enterobacteriaceae species NOT DETECTED NOT DETECTED   Enterobacter cloacae complex NOT DETECTED NOT DETECTED   Escherichia coli NOT DETECTED NOT DETECTED   Klebsiella oxytoca NOT DETECTED NOT DETECTED   Klebsiella pneumoniae NOT DETECTED NOT DETECTED   Proteus species NOT DETECTED NOT DETECTED   Serratia marcescens NOT DETECTED NOT DETECTED   Haemophilus influenzae NOT DETECTED NOT DETECTED   Neisseria meningitidis NOT DETECTED NOT DETECTED   Pseudomonas aeruginosa NOT DETECTED NOT DETECTED   Candida albicans NOT DETECTED NOT DETECTED   Candida glabrata NOT DETECTED NOT DETECTED   Candida krusei NOT DETECTED NOT DETECTED   Candida parapsilosis NOT DETECTED NOT DETECTED   Candida tropicalis NOT DETECTED NOT DETECTED    Nilza Eaker Poteet 06/25/2019   12:00 AM

## 2019-06-25 ENCOUNTER — Telehealth: Payer: Self-pay | Admitting: Gastroenterology

## 2019-06-25 DIAGNOSIS — R945 Abnormal results of liver function studies: Secondary | ICD-10-CM | POA: Insufficient documentation

## 2019-06-25 DIAGNOSIS — K831 Obstruction of bile duct: Secondary | ICD-10-CM

## 2019-06-25 DIAGNOSIS — B9620 Unspecified Escherichia coli [E. coli] as the cause of diseases classified elsewhere: Secondary | ICD-10-CM | POA: Diagnosis present

## 2019-06-25 DIAGNOSIS — F0390 Unspecified dementia without behavioral disturbance: Secondary | ICD-10-CM

## 2019-06-25 DIAGNOSIS — K8309 Other cholangitis: Secondary | ICD-10-CM | POA: Diagnosis present

## 2019-06-25 DIAGNOSIS — R160 Hepatomegaly, not elsewhere classified: Secondary | ICD-10-CM

## 2019-06-25 DIAGNOSIS — Z882 Allergy status to sulfonamides status: Secondary | ICD-10-CM

## 2019-06-25 DIAGNOSIS — Z87891 Personal history of nicotine dependence: Secondary | ICD-10-CM

## 2019-06-25 DIAGNOSIS — R7881 Bacteremia: Secondary | ICD-10-CM

## 2019-06-25 LAB — COMPREHENSIVE METABOLIC PANEL
ALT: 352 U/L — ABNORMAL HIGH (ref 0–44)
AST: 261 U/L — ABNORMAL HIGH (ref 15–41)
Albumin: 2.6 g/dL — ABNORMAL LOW (ref 3.5–5.0)
Alkaline Phosphatase: 186 U/L — ABNORMAL HIGH (ref 38–126)
Anion gap: 10 (ref 5–15)
BUN: 12 mg/dL (ref 8–23)
CO2: 21 mmol/L — ABNORMAL LOW (ref 22–32)
Calcium: 7.6 mg/dL — ABNORMAL LOW (ref 8.9–10.3)
Chloride: 106 mmol/L (ref 98–111)
Creatinine, Ser: 1.4 mg/dL — ABNORMAL HIGH (ref 0.61–1.24)
GFR calc Af Amer: 54 mL/min — ABNORMAL LOW (ref >60)
GFR calc non Af Amer: 46 mL/min — ABNORMAL LOW (ref >60)
Glucose, Bld: 93 mg/dL (ref 70–99)
Potassium: 4 mmol/L (ref 3.5–5.1)
Sodium: 137 mmol/L (ref 135–145)
Total Bilirubin: 1 mg/dL (ref 0.3–1.2)
Total Protein: 5.1 g/dL — ABNORMAL LOW (ref 6.5–8.1)

## 2019-06-25 LAB — CBC WITH DIFFERENTIAL/PLATELET
Abs Immature Granulocytes: 0.03 10*3/uL (ref 0.00–0.07)
Basophils Absolute: 0 10*3/uL (ref 0.0–0.1)
Basophils Relative: 0 %
Eosinophils Absolute: 0.1 10*3/uL (ref 0.0–0.5)
Eosinophils Relative: 2 %
HCT: 33.8 % — ABNORMAL LOW (ref 39.0–52.0)
Hemoglobin: 11.4 g/dL — ABNORMAL LOW (ref 13.0–17.0)
Immature Granulocytes: 0 %
Lymphocytes Relative: 10 %
Lymphs Abs: 1 10*3/uL (ref 0.7–4.0)
MCH: 33.7 pg (ref 26.0–34.0)
MCHC: 33.7 g/dL (ref 30.0–36.0)
MCV: 100 fL (ref 80.0–100.0)
Monocytes Absolute: 0.5 10*3/uL (ref 0.1–1.0)
Monocytes Relative: 5 %
Neutro Abs: 7.8 10*3/uL — ABNORMAL HIGH (ref 1.7–7.7)
Neutrophils Relative %: 83 %
Platelets: 130 10*3/uL — ABNORMAL LOW (ref 150–400)
RBC: 3.38 MIL/uL — ABNORMAL LOW (ref 4.22–5.81)
RDW: 14.9 % (ref 11.5–15.5)
WBC: 9.4 10*3/uL (ref 4.0–10.5)
nRBC: 0 % (ref 0.0–0.2)

## 2019-06-25 LAB — PHOSPHORUS: Phosphorus: 3.2 mg/dL (ref 2.5–4.6)

## 2019-06-25 LAB — GLUCOSE, CAPILLARY
Glucose-Capillary: 80 mg/dL (ref 70–99)
Glucose-Capillary: 88 mg/dL (ref 70–99)
Glucose-Capillary: 81 mg/dL (ref 70–99)
Glucose-Capillary: 98 mg/dL (ref 70–99)
Glucose-Capillary: 94 mg/dL (ref 70–99)

## 2019-06-25 LAB — HEPATITIS PANEL, ACUTE
HCV Ab: <0.1 s/co ratio (ref 0.0–0.9)
Hep A IgM: NEGATIVE
Hep B C IgM: NEGATIVE
Hepatitis B Surface Ag: NEGATIVE

## 2019-06-25 LAB — MAGNESIUM: Magnesium: 1.9 mg/dL (ref 1.7–2.4)

## 2019-06-25 LAB — CANCER ANTIGEN 19-9: CA 19-9: 124 U/mL — ABNORMAL HIGH (ref 0–35)

## 2019-06-25 MED ORDER — MAGNESIUM SULFATE 2 GM/50ML IV SOLN
2.0000 g | Freq: Once | INTRAVENOUS | Status: AC
  Administered 2019-06-25: 2 g via INTRAVENOUS
  Filled 2019-06-25: qty 50

## 2019-06-25 MED ORDER — PHENOL 1.4 % MT LIQD
1.0000 | OROMUCOSAL | Status: DC | PRN
  Administered 2019-06-25 – 2019-06-27 (×2): 1 via OROMUCOSAL
  Filled 2019-06-25: qty 177

## 2019-06-25 MED ORDER — ENOXAPARIN SODIUM 40 MG/0.4ML ~~LOC~~ SOLN: 40.0000 mg | SUBCUTANEOUS | Status: DC

## 2019-06-25 MED ORDER — INDOMETHACIN 50 MG RE SUPP
100.0000 mg | Freq: Once | RECTAL | Status: AC
  Administered 2019-06-25: 100 mg via RECTAL
  Filled 2019-06-25: qty 2

## 2019-06-25 NOTE — Telephone Encounter (Signed)
Talk to patient's wife Discussed with her Amy had already explained the procedure to her. She did not have any questions. Will await for ERCP results tomorrow  RG

## 2019-06-25 NOTE — Evaluation (Signed)
Physical Therapy Evaluation Patient Details Name: Ricardo Washington MRN: [id number] DOB: 1935/07/26 Today's Date: 06/25/2019   History of Present Illness  pt is an 84 y/o man admitted with c/o chest pain and back pain with possible biliary sepsis  Clinical Impression  Pt presents with deficits in strength, balance and gait and will benefit from skilled PT services to address deficits and improve functional mobility. Pt will benefit from 24 hour supervision/assistance at home and possible use of AD.     Follow Up Recommendations Home health PT    Equipment Recommendations  (TBD, may need RW)    Recommendations for Other Services OT consult     Precautions / Restrictions Precautions Precautions: Fall Restrictions Weight Bearing Restrictions: No      Mobility  Bed Mobility Overal bed mobility: Modified Independent             General bed mobility comments: increased time  Transfers Overall transfer level: Needs assistance Equipment used: None Transfers: Sit to/from Stand;Stand Pivot Transfers Sit to Stand: Min guard Stand pivot transfers: Min guard       General transfer comment: min guard for balance  Ambulation/Gait Ambulation/Gait assistance: Min guard Gait Distance (Feet): 150 Feet Assistive device: None       General Gait Details: pt with 1 LOB requiring min A to correct when stepping backwards to open the door. pt able to perform gait in a controlled environment with CGA  Stairs            Wheelchair Mobility    Modified Rankin (Stroke Patients Only)       Balance Overall balance assessment: Needs assistance   Sitting balance-Leahy Scale: Normal       Standing balance-Leahy Scale: Fair Standing balance comment: posterior LOB requires min A to correct                             Pertinent Vitals/Pain Pain Assessment: No/denies pain    Home Living Family/patient expects to be discharged to:: Private residence Living  Arrangements: Spouse/significant other Available Help at Discharge: Family Type of Home: House Home Access: Stairs to enter Entrance Stairs-Rails: Right Entrance Stairs-Number of Steps: 3 Home Layout: Multi-level Home Equipment: None      Prior Function Level of Independence: Independent         Comments: pt reports he drives and is able to walk without AD     Hand Dominance        Extremity/Trunk Assessment   Upper Extremity Assessment Upper Extremity Assessment: Generalized weakness    Lower Extremity Assessment Lower Extremity Assessment: Generalized weakness    Cervical / Trunk Assessment Cervical / Trunk Assessment: Kyphotic  Communication   Communication: HOH  Cognition Arousal/Alertness: Awake/alert Behavior During Therapy: WFL for tasks assessed/performed Overall Cognitive Status: History of cognitive impairments - at baseline                                        General Comments      Exercises     Assessment/Plan    PT Assessment Patient needs continued PT services  PT Problem List Decreased strength;Decreased activity tolerance;Decreased balance;Decreased mobility;Decreased safety awareness;Decreased knowledge of use of DME;Cardiopulmonary status limiting activity;Decreased coordination       PT Treatment Interventions DME instruction;Therapeutic exercise;Gait training;Balance training;Stair training;Neuromuscular re-education;Functional mobility training;Therapeutic activities;Patient/family education  PT Goals (Current goals can be found in the Care Plan section)  Acute Rehab PT Goals Patient Stated Goal: see my wife PT Goal Formulation: With patient Time For Goal Achievement: 07/09/19 Potential to Achieve Goals: Good    Frequency Min 3X/week   Barriers to discharge Decreased caregiver support;Inaccessible home environment      Co-evaluation               AM-PAC PT "6 Clicks" Mobility  Outcome Measure  Help needed turning from your back to your side while in a flat bed without using bedrails?: A Little Help needed moving from lying on your back to sitting on the side of a flat bed without using bedrails?: A Little Help needed moving to and from a bed to a chair (including a wheelchair)?: A Little Help needed standing up from a chair using your arms (e.g., wheelchair or bedside chair)?: A Little Help needed to walk in hospital room?: A Little Help needed climbing 3-5 steps with a railing? : A Little 6 Click Score: 18    End of Session Equipment Utilized During Treatment: Gait belt Activity Tolerance: Patient tolerated treatment well Patient left: in chair;with chair alarm set;with call bell/phone within reach Nurse Communication: Mobility status PT Visit Diagnosis: Unsteadiness on feet (R26.81);Muscle weakness (generalized) (M62.81);Difficulty in walking, not elsewhere classified (R26.2)    Time: 4210-3128 PT Time Calculation (min) (ACUTE ONLY): 25 min   Charges:   PT Evaluation $PT Eval Moderate Complexity: 1 Mod PT Treatments $Gait Training: 8-22 mins       Isabelle Course, PT, DPT  , 06/25/2019, 10:05 AM

## 2019-06-25 NOTE — Progress Notes (Signed)
Obtained verbal consent via phone for procedure from patient's wife [place]

## 2019-06-25 NOTE — Progress Notes (Signed)
Patient ID: Ricardo Washington, male   DOB: May 24, 1935, 84 y.o.   MRN: [id number]    Progress Note   Subjective  Day # 2 CC; abd pain, elevated LFTs  Patient up in chair, wife at bedside.  Patient has no current complaints of abdominal pain, no nausea or vomiting.   Today-T bili 1.0/alk phos 186/ALT/352/AST 261 Creatinine 1.4 WBC 9.4, hemoglobin 11.4  Blood cultures positive Enterobacter and E. Coli-sensitivity pending On Zosyn    Objective   Vital signs in last 24 hours: Temp:  [98.1 F (36.7 C)-98.4 F (36.9 C)] 98.1 F (36.7 C) (08/06 1145) Pulse Rate:  [71-77] 75 (08/06 1145) Resp:  [16-19] 18 (08/06 1145) BP: (99-128)/(43-65) 125/65 (08/06 1145) SpO2:  [96 %-99 %] 99 % (08/06 1145)   General:    Elderly white male in NAD, pleasant Heart:  Regular rate and rhythm; no murmurs Lungs: Respirations even and unlabored, lungs CTA bilaterally Abdomen:  Soft, mildly tender right upper quadrant and nondistended. Normal bowel sounds. Extremities:  Without edema. Neurologic:  Alert and oriented,  grossly normal neurologically, mild dementia Psych:  Cooperative. Normal mood and affect.  Intake/Output from previous day: 08/05 0701 - 08/06 0700 In: 60 [P.O.:60] Out: 1768 [Urine:1768] Intake/Output this shift: Total I/O In: -  Out: 400 [Urine:400]  Lab Results: Recent Labs    06/23/19 1717 06/24/19 0223 06/25/19 0324  WBC 7.6 17.7* 9.4  HGB 14.4 12.5* 11.4*  HCT 42.9 37.3* 33.8*  PLT 134* 146* 130*   BMET Recent Labs    06/23/19 1717 06/24/19 0223 06/25/19 0324  NA 139 140 137  K 3.7 3.4* 4.0  CL 107 106 106  CO2 19* 22 21*  GLUCOSE 162* 122* 93  BUN _0 CREATININE 1.17 1.30* 1.40*  CALCIUM 8.5* 7.9* 7.6*   LFT Recent Labs    06/24/19 0223 06/25/19 0324  PROT 5.6* 5.1*  ALBUMIN 2.9* 2.6*  AST 1,075* 261*  ALT 675* 352*  ALKPHOS 255* 186*  BILITOT 2.5* 1.0  BILIDIR 1.4*  --    PT/INR Recent Labs    06/24/19 0223  LABPROT 17.5*  INR  1.5*    Studies/Results: Mr 3d Recon At Scanner  Result Date: 06/25/2019 CLINICAL DATA:  Abn liver function tests (LFTs) Abd pain, extrahepatic cholangiocarcinoma suspected48m of gadavist. 84year old male with presumed cholangiocarcinoma, having been monitored by imaging over the last year. Now presenting with abdominal pain, significantly elevated liver enzymes and leukocytosis. No fever. EXAM: MRI ABDOMEN WITHOUT AND WITH CONTRAST (INCLUDING MRCP) TECHNIQUE: Multiplanar multisequence MR imaging of the abdomen was performed both before and after the administration of intravenous contrast. Heavily T2-weighted images of the biliary and pancreatic ducts were obtained, and three-dimensional MRCP images were rendered by post processing. CONTRAST:  Seven mL Gadavist COMPARISON:  CT 06/23/2019, MRI 05/03/2018, PET-CT 05/18/2019 FINDINGS: Lower chest:  Small bilateral pleural effusions. Hepatobiliary: There is mild duct dilatation within the RIGHT hepatic lobe (segment 8/6) which is similar to comparison MRI 05/03/2018 (image 19/1)7. There is minimal contrast enhancement through this region of obstruction. Lesion is better identified on the comparison FDG PET scan. The MRCP sequence do demonstrate interruption of the ducts in the same segment. The distal common bile duct is normal caliber. No new lesions identified. Pancreas: Normal pancreatic parenchymal intensity. No ductal dilatation or inflammation. Spleen: Normal spleen. Adrenals/urinary tract: Adrenal glands and kidneys are normal. Stomach/Bowel: Stomach and limited of the small bowel is unremarkable Vascular/Lymphatic: Abdominal aortic normal caliber. No retroperitoneal periportal  lymphadenopathy. Musculoskeletal: No aggressive osseous lesion IMPRESSION: 1. Persistent duct dilatation in segment 8/6 of the RIGHT hepatic lobe with suspicion of region central obstruction identified by hypermetabolic tissue on comparison FDG PET scan. The MRI findings are very  similar to MRI of 05/03/2018. The tumor is very poorly demonstrated on current exam. There is some limitation to the imaging due to patient body motion. 2. Normal distal common bile duct.  Normal pancreas. Electronically Signed   By: Suzy Bouchard M.D.   On: 06/25/2019 07:57   Dg Chest Port 1 View  Result Date: 06/23/2019 CLINICAL DATA:  Chest pain, back pain, constipation for 1 day EXAM: PORTABLE CHEST 1 VIEW COMPARISON:  Radiograph May 05, 2011 FINDINGS: Postsurgical changes related to prior CABG including intact and aligned sternotomy wires and multiple surgical clips projecting over the mediastinum. Cardiac silhouette is borderline enlarged linear opacities in the right lung base likely reflect subsegmental atelectasis no consolidation, features of edema, pneumothorax, or effusion. Pulmonary vascularity is normally distributed. No acute osseous or soft tissue abnormality. Degenerative changes are present in the and imaged spine and shoulders. Cardiac leads overlie the chest. IMPRESSION: No acute cardiopulmonary abnormality Electronically Signed   By: Lovena Le M.D.   On: 06/23/2019 17:04   Mr Abdomen Mrcp Moise Boring Contast  Result Date: 06/25/2019 CLINICAL DATA:  Abn liver function tests (LFTs) Abd pain, extrahepatic cholangiocarcinoma suspected8m of gadavist. 84year old male with presumed cholangiocarcinoma, having been monitored by imaging over the last year. Now presenting with abdominal pain, significantly elevated liver enzymes and leukocytosis. No fever. EXAM: MRI ABDOMEN WITHOUT AND WITH CONTRAST (INCLUDING MRCP) TECHNIQUE: Multiplanar multisequence MR imaging of the abdomen was performed both before and after the administration of intravenous contrast. Heavily T2-weighted images of the biliary and pancreatic ducts were obtained, and three-dimensional MRCP images were rendered by post processing. CONTRAST:  Seven mL Gadavist COMPARISON:  CT 06/23/2019, MRI 05/03/2018, PET-CT 05/18/2019 FINDINGS:  Lower chest:  Small bilateral pleural effusions. Hepatobiliary: There is mild duct dilatation within the RIGHT hepatic lobe (segment 8/6) which is similar to comparison MRI 05/03/2018 (image 19/1)7. There is minimal contrast enhancement through this region of obstruction. Lesion is better identified on the comparison FDG PET scan. The MRCP sequence do demonstrate interruption of the ducts in the same segment. The distal common bile duct is normal caliber. No new lesions identified. Pancreas: Normal pancreatic parenchymal intensity. No ductal dilatation or inflammation. Spleen: Normal spleen. Adrenals/urinary tract: Adrenal glands and kidneys are normal. Stomach/Bowel: Stomach and limited of the small bowel is unremarkable Vascular/Lymphatic: Abdominal aortic normal caliber. No retroperitoneal periportal lymphadenopathy. Musculoskeletal: No aggressive osseous lesion IMPRESSION: 1. Persistent duct dilatation in segment 8/6 of the RIGHT hepatic lobe with suspicion of region central obstruction identified by hypermetabolic tissue on comparison FDG PET scan. The MRI findings are very similar to MRI of 05/03/2018. The tumor is very poorly demonstrated on current exam. There is some limitation to the imaging due to patient body motion. 2. Normal distal common bile duct.  Normal pancreas. Electronically Signed   By: SSuzy BouchardM.D.   On: 06/25/2019 07:57   Ct Angio Chest/abd/pel For Dissection W And/or Wo Contrast  Result Date: 06/23/2019 CLINICAL DATA:  Chest and back pain for 1 day, no known injury, initial encounter EXAM: CT ANGIOGRAPHY CHEST, ABDOMEN AND PELVIS TECHNIQUE: Multidetector CT imaging through the chest, abdomen and pelvis was performed using the standard protocol during bolus administration of intravenous contrast. Multiplanar reconstructed images and MIPs were obtained and reviewed  to evaluate the vascular anatomy. CONTRAST:  181m OMNIPAQUE IOHEXOL 350 MG/ML SOLN COMPARISON:  None. FINDINGS: CTA  CHEST FINDINGS Cardiovascular: Thoracic aorta demonstrates bovine branching anatomy. Mild atherosclerotic changes are seen without aneurysmal dilatation or dissection. Changes of prior coronary bypass grafting are seen. No cardiac enlargement is noted. Pulmonary artery is prominent and shows no definitive central pulmonary embolus although timing was not performed for embolus evaluation. Coronary calcifications are noted. No pericardial effusion is noted. Mediastinum/Nodes: Thoracic inlet is within normal limits. No hilar or mediastinal adenopathy is noted. No mediastinal hematoma is seen. The esophagus as visualized is within normal limits. Lungs/Pleura: The lungs are well aerated bilaterally. No focal infiltrate or sizable effusion is seen. Mild bibasilar atelectatic changes are noted. No sizable parenchymal nodules are seen. Musculoskeletal: Degenerative changes of the thoracic spine are noted. No acute rib abnormality is seen. Prior median sternotomy is noted. Review of the MIP images confirms the above findings. CTA ABDOMEN AND PELVIS FINDINGS VASCULAR Aorta: The abdominal aorta demonstrates atherosclerotic calcifications without aneurysmal dilatation or focal dissection. No extravasation is seen. Celiac: Calcifications are noted at the origin of the celiac axis although no focal stenosis is noted. SMA: Calcifications are noted at the origin of the superior mesenteric artery although no focal stenosis is seen. Renals: Single renal arteries are identified bilaterally with mild narrowing at the origins. IMA: Patent without evidence of aneurysm, dissection, vasculitis or significant stenosis. Iliacs: Iliacs are widely patent without aneurysmal dilatation or dissection. Veins: No vein abnormality is noted. Review of the MIP images confirms the above findings. NON-VASCULAR Hepatobiliary: Liver demonstrates changes of mild fatty infiltration. Mild segmental biliary dilatation is again noted similar to that seen on  the prior exam. The previously seen central hypodense lesion is again identified but stable. No significant increase in biliary dilatation is noted. The gallbladder is unremarkable. Pancreas: Unremarkable. No pancreatic ductal dilatation or surrounding inflammatory changes. Spleen: Normal in size without focal abnormality. Adrenals/Urinary Tract: Adrenal glands are within normal limits. Kidneys demonstrate a normal enhancement pattern bilaterally. No obstructive changes are seen. The bladder is well distended. Stomach/Bowel: Diverticular change of the colon is noted. Diffuse wall thickening is noted within the rectosigmoid region consistent with focal colitis. No perforation or abscess is noted. No significant pericolonic inflammatory changes are seen. The appendix is well visualized and within normal limits. No small bowel or gastric abnormality is noted. Lymphatic: No significant lymphadenopathy is noted. Reproductive: Prostate is unremarkable. Other: No abdominal wall hernia or abnormality. No abdominopelvic ascites. Musculoskeletal: Degenerative changes of lumbar spine are noted. No compression deformity is seen. Review of the MIP images confirms the above findings. IMPRESSION: No evidence of aneurysmal dilatation or dissection within the thoracic and abdominal aorta. Stable segmental biliary dilatation within segment 8 of the liver stable from the prior exam. Mild decreased attenuation is noted centrally similar to that seen on prior MRI. No significant enlargement is noted. Wall thickening within the rectosigmoid consistent with focal colitis. No abscess or perforation is noted. Chronic changes as described above. Electronically Signed   By: MInez CatalinaM.D.   On: 06/23/2019 19:54       Assessment / Plan:    [id number] old white male with history of presumed cholangiocarcinoma, who has been monitored over the past year as LFTs have been normal and he was asymptomatic. He presents now with an episode  of acute abdominal pain, noted to have significantly elevated LFTs and leukocytosis.  Picture consistent with acute cholangitis secondary to biliary obstruction  Blood cultures have returned positive for Enterobacter and E. Coli.  MRCP/MRI shows mild ductal dilation within the right hepatic lobe with minimal contrast enhancement throughout this region there is interruption of the ducts in the same segment, distal common  duct normal caliber  Patient has been on Zosyn, is afebrile and LFTs have improved overnight.  #2 dementia #3 history of coronary artery disease #4 adult onset diabetes mellitus   Plan; Patient is scheduled for ERCP, with brushings and stent placement with Dr. Rush Landmark in a.m. tomorrow.  Procedure was discussed in detail with the patient, patient's wife and patient's son by phone this afternoon.  I have reviewed the procedure, and potential complications and they are agreeable to proceed.  Hold Lovenox tonight Continue Zosyn    Principal Problem:   Biliary sepsis Active Problems:   Essential hypertension   Diabetes mellitus, type 2 (HCC)   Elevated LFTs   Hypokalemia   Acute kidney injury ([place])   Dementia (HCC)   Cholangiocarcinoma (HCC)   Colitis   Hypomagnesemia   Hypophosphatemia   Acute cholangitis     LOS: 1 day   Deng Kemler EsterwoodPA-C  06/25/2019, 2:02 PM

## 2019-06-25 NOTE — Progress Notes (Addendum)
[facility] PROGRESS NOTE  Ricardo Washington QMV:[id number] DOB: 1935-09-01 DOA: 06/23/2019 PCP: [name], MD  Assessment/Plan:  Biliary sepsis/colitis/bacteremia:  remains afebrile, BP increased from yesterday. Not tachycardic or tachypnea. Wbc 9.4. Blood cultures with E coli. Evaluated by GI who opine cholangitis in setting of known hepatobiliary mass, probable cancer leading to biliary obstruction. MRCP reveals persistent duct dilatation in segment 8/6 of right hepatic lobe suspicion of region central obstruction. -ERCP tomorrow -ID consult for antibiotic recs and duration given ecoli bacteremia with cholangitis -continue Zosyn)  Possible cholangiocarcinoma: see #1 No tissue diagnosis associated due to inability to obtain tissue sample. GI is managing.  Acute kidney injury. Creatinine continues to trend up. Currently 1.4 from 1.04. -hold nephrotoxins -continue gentle IV fluids -monitor urine output -recheck in am  Abnormal LFTs: Likely related to above.LFT's trending down this am. ? Has stone cleared?. total bili 1.0. Hepatitis panel negative. -- ERCP per GI   Dementia: spoke to wife and he is stable at baseline No behavioral problems  Diabetes mellitus: home meds include glucophage. HgA1c 6.1 last month. Serum glucose 93 this am -hold oral agents for now -Sliding scale insulin.  Hypertension. Home meds include lisinopril, lopressor. BP trending up somewhat but still low end of normal.  -will hold antihypertesive meds for now  Hypomagnesemia. Mag level 1.2 -repleted  Code Status: full Family Communication: wife on phone Disposition Plan: home likely   Consultants:  [facility]  Procedures:  MRCP 8/6  Antibiotics:  Zosyn 8/4>>  HPI/Subjective: Awake alert no acute distress.   Objective: Vitals:   06/25/19 0339 06/25/19 0902  BP: 111/65 128/65  Pulse: 71 73  Resp: 19 18  Temp: 98.3 F (36.8 C) 98.2 F (36.8 C)  SpO2: 96% 97%     Intake/Output Summary (Last 24 hours) at 06/25/2019 0914 Last data filed at 06/25/2019 0650 Gross per 24 hour  Intake -  Output 1768 ml  Net -1768 ml   Filed Weights   06/24/19 0045 06/24/19 0141  Weight: 77.4 kg 75 kg    Exam:   General:  Awake alert no acute distress  Cardiovascular: rrr no mgr no LE edema  Respiratory: normal effort BS clear bilaterally no wheeze no crackles  Abdomen: non-distended soft +BS mild diffuse tenderness no guarding or rebounding  Musculoskeletal: joints without swelling/erythema   Data Reviewed: Basic Metabolic Panel: Recent Labs  Lab 06/23/19 1717 06/24/19 0223 06/25/19 0324  NA 139 140 137  K 3.7 3.4* 4.0  CL 107 106 106  CO2 19* 22 21*  GLUCOSE 162* 122* 93  BUN 15 15 12   CREATININE 1.17 1.30* 1.40*  CALCIUM 8.5* 7.9* 7.6*  MG  --  1.2* 1.9  PHOS  --  2.4* 3.2   Liver Function Tests: Recent Labs  Lab 06/23/19 1717 06/24/19 0223 06/25/19 0324  AST 1,061* 1,075* 261*  ALT 463* 675* 352*  ALKPHOS 256* 255* 186*  BILITOT 2.0* 2.5* 1.0  PROT 6.1* 5.6* 5.1*  ALBUMIN 3.4* 2.9* 2.6*   Recent Labs  Lab 06/23/19 1717  LIPASE 22   No results for input(s): AMMONIA in the last 168 hours. CBC: Recent Labs  Lab 06/23/19 1717 06/24/19 0223 06/25/19 0324  WBC 7.6 17.7* 9.4  NEUTROABS  --   --  7.8*  HGB 14.4 12.5* 11.4*  HCT 42.9 37.3* 33.8*  MCV 103.6* 102.2* 100.0  PLT 134* 146* 130*   Cardiac Enzymes: No results for input(s): CKTOTAL, CKMB, CKMBINDEX, TROPONINI in the last 168  hours. BNP (last 3 results) No results for input(s): BNP in the last 8760 hours.  ProBNP (last 3 results) No results for input(s): PROBNP in the last 8760 hours.  CBG: Recent Labs  Lab 06/24/19 1121 06/24/19 1800 06/24/19 2137 06/25/19 0640 06/25/19 0746  GLUCAP 130* 85 82 80 88    Recent Results (from the past 240 hour(s))  Blood culture (routine x 2)     Status: None (Preliminary result)   Collection Time: 06/23/19  8:58 PM    Specimen: BLOOD LEFT FOREARM  Result Value Ref Range Status   Specimen Description BLOOD LEFT FOREARM  Final   Special Requests   Final    BOTTLES DRAWN AEROBIC AND ANAEROBIC Blood Culture adequate volume   Culture  Setup Time   Final    ANAEROBIC BOTTLE ONLY GRAM NEGATIVE RODS CRITICAL VALUE NOTED.  VALUE IS CONSISTENT WITH PREVIOUSLY REPORTED AND CALLED VALUE. Performed at [facility], [place] [address] [place], [place] [address]    Culture GRAM NEGATIVE RODS  Final   Report Status PENDING  Incomplete  Blood culture (routine x 2)     Status: None (Preliminary result)   Collection Time: 06/23/19  9:03 PM   Specimen: BLOOD  Result Value Ref Range Status   Specimen Description BLOOD RIGHT ANTECUBITAL  Final   Special Requests   Final    BOTTLES DRAWN AEROBIC AND ANAEROBIC Blood Culture adequate volume   Culture  Setup Time   Final    GRAM NEGATIVE RODS ANAEROBIC BOTTLE ONLY Organism ID to follow CRITICAL RESULT CALLED TO, READ BACK BY AND VERIFIED WITH: Reece Packer PharmD 17:10 06/24/19 ([email or web address]) AEROBIC BOTTLE ONLY GRAM POSITIVE COCCI CRITICAL RESULT CALLED TO, READ BACK BY AND VERIFIED WITH: L SEAY PHARMD 06/24/19 2342 JDW    Culture   Final    CULTURE REINCUBATED FOR BETTER GROWTH Performed at [facility], [place] [address] [place], [place] [address]    Report Status PENDING  Incomplete  Blood Culture ID Panel (Reflexed)     Status: Abnormal   Collection Time: 06/23/19  9:03 PM  Result Value Ref Range Status   Enterococcus species NOT DETECTED NOT DETECTED Final   Listeria monocytogenes NOT DETECTED NOT DETECTED Final   Staphylococcus species NOT DETECTED NOT DETECTED Final   Staphylococcus aureus (BCID) NOT DETECTED NOT DETECTED Final   Streptococcus species NOT DETECTED NOT DETECTED Final   Streptococcus agalactiae NOT DETECTED NOT DETECTED Final   Streptococcus pneumoniae NOT DETECTED NOT DETECTED Final   Streptococcus pyogenes NOT DETECTED NOT DETECTED Final    Acinetobacter baumannii NOT DETECTED NOT DETECTED Final   Enterobacteriaceae species DETECTED (A) NOT DETECTED Final    Comment: Enterobacteriaceae represent a large family of gram-negative bacteria, not a single organism. CRITICAL RESULT CALLED TO, READ BACK BY AND VERIFIED WITH: Reece Packer PharmD 17:10 06/24/19 ([email or web address])    Enterobacter cloacae complex NOT DETECTED NOT DETECTED Final   Escherichia coli DETECTED (A) NOT DETECTED Final    Comment: CRITICAL RESULT CALLED TO, READ BACK BY AND VERIFIED WITH: Reece Packer PharmD 17:10 06/24/19 ([email or web address])    Klebsiella oxytoca NOT DETECTED NOT DETECTED Final   Klebsiella pneumoniae NOT DETECTED NOT DETECTED Final   Proteus species NOT DETECTED NOT DETECTED Final   Serratia marcescens NOT DETECTED NOT DETECTED Final   Carbapenem resistance NOT DETECTED NOT DETECTED Final   Haemophilus influenzae NOT DETECTED NOT DETECTED Final   Neisseria meningitidis NOT DETECTED NOT DETECTED Final   Pseudomonas aeruginosa  NOT DETECTED NOT DETECTED Final   Candida albicans NOT DETECTED NOT DETECTED Final   Candida glabrata NOT DETECTED NOT DETECTED Final   Candida krusei NOT DETECTED NOT DETECTED Final   Candida parapsilosis NOT DETECTED NOT DETECTED Final   Candida tropicalis NOT DETECTED NOT DETECTED Final    Comment: Performed at [facility], [place] [address] [place], [place] [address]  Blood Culture ID Panel (Reflexed)     Status: Abnormal   Collection Time: 06/23/19  9:03 PM  Result Value Ref Range Status   Enterococcus species NOT DETECTED NOT DETECTED Final   Listeria monocytogenes NOT DETECTED NOT DETECTED Final   Staphylococcus species DETECTED (A) NOT DETECTED Final    Comment: Methicillin (oxacillin) susceptible coagulase negative staphylococcus. Possible blood culture contaminant (unless isolated from more than one blood culture draw or clinical case suggests pathogenicity). No antibiotic treatment is indicated for blood  culture  contaminants. CRITICAL RESULT CALLED TO, READ BACK BY AND VERIFIED WITH: L SEAY PHARMD 06/24/19 2342 JDW    Staphylococcus aureus (BCID) NOT DETECTED NOT DETECTED Final   Methicillin resistance NOT DETECTED NOT DETECTED Final   Streptococcus species NOT DETECTED NOT DETECTED Final   Streptococcus agalactiae NOT DETECTED NOT DETECTED Final   Streptococcus pneumoniae NOT DETECTED NOT DETECTED Final   Streptococcus pyogenes NOT DETECTED NOT DETECTED Final   Acinetobacter baumannii NOT DETECTED NOT DETECTED Final   Enterobacteriaceae species NOT DETECTED NOT DETECTED Final   Enterobacter cloacae complex NOT DETECTED NOT DETECTED Final   Escherichia coli NOT DETECTED NOT DETECTED Final   Klebsiella oxytoca NOT DETECTED NOT DETECTED Final   Klebsiella pneumoniae NOT DETECTED NOT DETECTED Final   Proteus species NOT DETECTED NOT DETECTED Final   Serratia marcescens NOT DETECTED NOT DETECTED Final   Haemophilus influenzae NOT DETECTED NOT DETECTED Final   Neisseria meningitidis NOT DETECTED NOT DETECTED Final   Pseudomonas aeruginosa NOT DETECTED NOT DETECTED Final   Candida albicans NOT DETECTED NOT DETECTED Final   Candida glabrata NOT DETECTED NOT DETECTED Final   Candida krusei NOT DETECTED NOT DETECTED Final   Candida parapsilosis NOT DETECTED NOT DETECTED Final   Candida tropicalis NOT DETECTED NOT DETECTED Final    Comment: Performed at [facility], [place] [address], [place], [place] [address]  SARS Coronavirus 2 [facility] order, Performed in [facility] hospital lab) Nasopharyngeal Nasopharyngeal Swab     Status: None   Collection Time: 06/23/19  9:56 PM   Specimen: Nasopharyngeal Swab  Result Value Ref Range Status   SARS Coronavirus 2 NEGATIVE NEGATIVE Final    Comment: (NOTE) If result is NEGATIVE SARS-CoV-2 target nucleic acids are NOT DETECTED. The SARS-CoV-2 RNA is generally detectable in upper and lower  respiratory specimens during the acute phase of infection. The  lowest  concentration of SARS-CoV-2 viral copies this assay can detect is 250  copies / mL. A negative result does not preclude SARS-CoV-2 infection  and should not be used as the sole basis for treatment or other  patient management decisions.  A negative result may occur with  improper specimen collection / handling, submission of specimen other  than nasopharyngeal swab, presence of viral mutation(s) within the  areas targeted by this assay, and inadequate number of viral copies  (<250 copies / mL). A negative result must be combined with clinical  observations, patient history, and epidemiological information. If result is POSITIVE SARS-CoV-2 target nucleic acids are DETECTED. The SARS-CoV-2 RNA is generally detectable in upper and lower  respiratory specimens dur ing the acute phase of infection.  Positive  results are indicative of active infection with SARS-CoV-2.  Clinical  correlation with patient history and other diagnostic information is  necessary to determine patient infection status.  Positive results do  not rule out bacterial infection or co-infection with other viruses. If result is PRESUMPTIVE POSTIVE SARS-CoV-2 nucleic acids MAY BE PRESENT.   A presumptive positive result was obtained on the submitted specimen  and confirmed on repeat testing.  While 2019 novel coronavirus  (SARS-CoV-2) nucleic acids may be present in the submitted sample  additional confirmatory testing may be necessary for epidemiological  and / or clinical management purposes  to differentiate between  SARS-CoV-2 and other Sarbecovirus currently known to infect humans.  If clinically indicated additional testing with an alternate test  methodology [phone]) is advised. The SARS-CoV-2 RNA is generally  detectable in upper and lower respiratory sp ecimens during the acute  phase of infection. The expected result is Negative. Fact Sheet for Patients:   StrictlyIdeas.no Fact Sheet for Healthcare Providers: BankingDealers.co.za This test is not yet approved or cleared by the [place] FDA and has been authorized for detection and/or diagnosis of SARS-CoV-2 by FDA under an Emergency Use Authorization (EUA).  This EUA will remain in effect (meaning this test can be used) for the duration of the COVID-19 declaration under Section 564(b)(1) of the Act, 21 U.S.C. section 360bbb-3(b)(1), unless the authorization is terminated or revoked sooner. Performed at [facility], [place] [address] [place], [place] [address]      Studies: Mr 3d Recon At Scanner  Result Date: 06/25/2019 CLINICAL DATA:  Abn liver function tests (LFTs) Abd pain, extrahepatic cholangiocarcinoma suspected59m of gadavist. 84year old male with presumed cholangiocarcinoma, having been monitored by imaging over the last year. Now presenting with abdominal pain, significantly elevated liver enzymes and leukocytosis. No fever. EXAM: MRI ABDOMEN WITHOUT AND WITH CONTRAST (INCLUDING MRCP) TECHNIQUE: Multiplanar multisequence MR imaging of the abdomen was performed both before and after the administration of intravenous contrast. Heavily T2-weighted images of the biliary and pancreatic ducts were obtained, and three-dimensional MRCP images were rendered by post processing. CONTRAST:  Seven mL Gadavist COMPARISON:  CT 06/23/2019, MRI 05/03/2018, PET-CT 05/18/2019 FINDINGS: Lower chest:  Small bilateral pleural effusions. Hepatobiliary: There is mild duct dilatation within the RIGHT hepatic lobe (segment 8/6) which is similar to comparison MRI 05/03/2018 (image 19/1)7. There is minimal contrast enhancement through this region of obstruction. Lesion is better identified on the comparison FDG PET scan. The MRCP sequence do demonstrate interruption of the ducts in the same segment. The distal common bile duct is normal caliber. No new lesions  identified. Pancreas: Normal pancreatic parenchymal intensity. No ductal dilatation or inflammation. Spleen: Normal spleen. Adrenals/urinary tract: Adrenal glands and kidneys are normal. Stomach/Bowel: Stomach and limited of the small bowel is unremarkable Vascular/Lymphatic: Abdominal aortic normal caliber. No retroperitoneal periportal lymphadenopathy. Musculoskeletal: No aggressive osseous lesion IMPRESSION: 1. Persistent duct dilatation in segment 8/6 of the RIGHT hepatic lobe with suspicion of region central obstruction identified by hypermetabolic tissue on comparison FDG PET scan. The MRI findings are very similar to MRI of 05/03/2018. The tumor is very poorly demonstrated on current exam. There is some limitation to the imaging due to patient body motion. 2. Normal distal common bile duct.  Normal pancreas. Electronically Signed   By: SSuzy BouchardM.D.   On: 06/25/2019 07:57   Dg Chest Port 1 View  Result Date: 06/23/2019 CLINICAL DATA:  Chest pain, back pain, constipation for 1 day EXAM: PORTABLE CHEST 1 VIEW COMPARISON:  Radiograph May 05, 2011 FINDINGS: Postsurgical changes related to prior CABG including intact and aligned sternotomy wires and multiple surgical clips projecting over the mediastinum. Cardiac silhouette is borderline enlarged linear opacities in the right lung base likely reflect subsegmental atelectasis no consolidation, features of edema, pneumothorax, or effusion. Pulmonary vascularity is normally distributed. No acute osseous or soft tissue abnormality. Degenerative changes are present in the and imaged spine and shoulders. Cardiac leads overlie the chest. IMPRESSION: No acute cardiopulmonary abnormality Electronically Signed   By: Lovena Le M.D.   On: 06/23/2019 17:04   Mr Abdomen Mrcp Moise Boring Contast  Result Date: 06/25/2019 CLINICAL DATA:  Abn liver function tests (LFTs) Abd pain, extrahepatic cholangiocarcinoma suspected88m of gadavist. 84year old male with presumed  cholangiocarcinoma, having been monitored by imaging over the last year. Now presenting with abdominal pain, significantly elevated liver enzymes and leukocytosis. No fever. EXAM: MRI ABDOMEN WITHOUT AND WITH CONTRAST (INCLUDING MRCP) TECHNIQUE: Multiplanar multisequence MR imaging of the abdomen was performed both before and after the administration of intravenous contrast. Heavily T2-weighted images of the biliary and pancreatic ducts were obtained, and three-dimensional MRCP images were rendered by post processing. CONTRAST:  Seven mL Gadavist COMPARISON:  CT 06/23/2019, MRI 05/03/2018, PET-CT 05/18/2019 FINDINGS: Lower chest:  Small bilateral pleural effusions. Hepatobiliary: There is mild duct dilatation within the RIGHT hepatic lobe (segment 8/6) which is similar to comparison MRI 05/03/2018 (image 19/1)7. There is minimal contrast enhancement through this region of obstruction. Lesion is better identified on the comparison FDG PET scan. The MRCP sequence do demonstrate interruption of the ducts in the same segment. The distal common bile duct is normal caliber. No new lesions identified. Pancreas: Normal pancreatic parenchymal intensity. No ductal dilatation or inflammation. Spleen: Normal spleen. Adrenals/urinary tract: Adrenal glands and kidneys are normal. Stomach/Bowel: Stomach and limited of the small bowel is unremarkable Vascular/Lymphatic: Abdominal aortic normal caliber. No retroperitoneal periportal lymphadenopathy. Musculoskeletal: No aggressive osseous lesion IMPRESSION: 1. Persistent duct dilatation in segment 8/6 of the RIGHT hepatic lobe with suspicion of region central obstruction identified by hypermetabolic tissue on comparison FDG PET scan. The MRI findings are very similar to MRI of 05/03/2018. The tumor is very poorly demonstrated on current exam. There is some limitation to the imaging due to patient body motion. 2. Normal distal common bile duct.  Normal pancreas. Electronically Signed    By: SSuzy BouchardM.D.   On: 06/25/2019 07:57   Ct Angio Chest/abd/pel For Dissection W And/or Wo Contrast  Result Date: 06/23/2019 CLINICAL DATA:  Chest and back pain for 1 day, no known injury, initial encounter EXAM: CT ANGIOGRAPHY CHEST, ABDOMEN AND PELVIS TECHNIQUE: Multidetector CT imaging through the chest, abdomen and pelvis was performed using the standard protocol during bolus administration of intravenous contrast. Multiplanar reconstructed images and MIPs were obtained and reviewed to evaluate the vascular anatomy. CONTRAST:  1023mOMNIPAQUE IOHEXOL 350 MG/ML SOLN COMPARISON:  None. FINDINGS: CTA CHEST FINDINGS Cardiovascular: Thoracic aorta demonstrates bovine branching anatomy. Mild atherosclerotic changes are seen without aneurysmal dilatation or dissection. Changes of prior coronary bypass grafting are seen. No cardiac enlargement is noted. Pulmonary artery is prominent and shows no definitive central pulmonary embolus although timing was not performed for embolus evaluation. Coronary calcifications are noted. No pericardial effusion is noted. Mediastinum/Nodes: Thoracic inlet is within normal limits. No hilar or mediastinal adenopathy is noted. No mediastinal hematoma is seen. The esophagus as visualized is  within normal limits. Lungs/Pleura: The lungs are well aerated bilaterally. No focal infiltrate or sizable effusion is seen. Mild bibasilar atelectatic changes are noted. No sizable parenchymal nodules are seen. Musculoskeletal: Degenerative changes of the thoracic spine are noted. No acute rib abnormality is seen. Prior median sternotomy is noted. Review of the MIP images confirms the above findings. CTA ABDOMEN AND PELVIS FINDINGS VASCULAR Aorta: The abdominal aorta demonstrates atherosclerotic calcifications without aneurysmal dilatation or focal dissection. No extravasation is seen. Celiac: Calcifications are noted at the origin of the celiac axis although no focal stenosis is noted.  SMA: Calcifications are noted at the origin of the superior mesenteric artery although no focal stenosis is seen. Renals: Single renal arteries are identified bilaterally with mild narrowing at the origins. IMA: Patent without evidence of aneurysm, dissection, vasculitis or significant stenosis. Iliacs: Iliacs are widely patent without aneurysmal dilatation or dissection. Veins: No vein abnormality is noted. Review of the MIP images confirms the above findings. NON-VASCULAR Hepatobiliary: Liver demonstrates changes of mild fatty infiltration. Mild segmental biliary dilatation is again noted similar to that seen on the prior exam. The previously seen central hypodense lesion is again identified but stable. No significant increase in biliary dilatation is noted. The gallbladder is unremarkable. Pancreas: Unremarkable. No pancreatic ductal dilatation or surrounding inflammatory changes. Spleen: Normal in size without focal abnormality. Adrenals/Urinary Tract: Adrenal glands are within normal limits. Kidneys demonstrate a normal enhancement pattern bilaterally. No obstructive changes are seen. The bladder is well distended. Stomach/Bowel: Diverticular change of the colon is noted. Diffuse wall thickening is noted within the rectosigmoid region consistent with focal colitis. No perforation or abscess is noted. No significant pericolonic inflammatory changes are seen. The appendix is well visualized and within normal limits. No small bowel or gastric abnormality is noted. Lymphatic: No significant lymphadenopathy is noted. Reproductive: Prostate is unremarkable. Other: No abdominal wall hernia or abnormality. No abdominopelvic ascites. Musculoskeletal: Degenerative changes of lumbar spine are noted. No compression deformity is seen. Review of the MIP images confirms the above findings. IMPRESSION: No evidence of aneurysmal dilatation or dissection within the thoracic and abdominal aorta. Stable segmental biliary  dilatation within segment 8 of the liver stable from the prior exam. Mild decreased attenuation is noted centrally similar to that seen on prior MRI. No significant enlargement is noted. Wall thickening within the rectosigmoid consistent with focal colitis. No abscess or perforation is noted. Chronic changes as described above. Electronically Signed   By: Inez Catalina M.D.   On: 06/23/2019 19:54    Scheduled Meds: . enoxaparin (LOVENOX) injection  40 mg Subcutaneous Q24H  . finasteride  5 mg Oral Daily  . insulin aspart  0-5 Units Subcutaneous QHS  . insulin aspart  0-9 Units Subcutaneous TID WC  . tamsulosin  0.4 mg Oral Daily   Continuous Infusions: . sodium chloride 100 mL/hr at 06/24/19 1005  . piperacillin-tazobactam (ZOSYN)  IV 3.375 g (06/25/19 0529)    Principal Problem:   Biliary sepsis Active Problems:   Elevated LFTs   Acute kidney injury ([place])   Essential hypertension   Diabetes mellitus, type 2 (HCC)   Hypokalemia   Dementia (HCC)   Cholangiocarcinoma (HCC)   Colitis   Hypomagnesemia   Hypophosphatemia   Acute cholangitis    Time spent: 1 minutes    [place] NP  [facility]  If 7PM-7AM, please contact night-coverage at [email or web address], password [facility] 06/25/2019, 9:14 AM  LOS: 1 day

## 2019-06-25 NOTE — Telephone Encounter (Signed)
Patient's wife-Joanne-verified DPR- has called in to the office as she has not been able to get an update on the patient's status as he is in the hospital at this time; patient's wife would like at call back at [phone] from Dr. [name] explaining what is planned for this patient for his plan of care;  Please call the patient's wife with an update if able

## 2019-06-25 NOTE — Consult Note (Signed)
[facility]    Date of Admission:  06/23/2019     Total days of antibiotics 3  Zosyn day 3              Reason for Consult: Ecoli bacteremia     Referring Provider: Grandville Silos Primary Care Provider: [name], MD   Assessment: Ricardo Washington is a 84 y.o. 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 His leukocytosis has resolved, continues to be afebrile and pain free at the time of our assessment. MRCP reveals chronic dilated biliary system likely obstructed mechanically by mass described. He is likely going to undergo ERCP per Dr. Vena Rua plan although uncertain as to when. This may offer some therapeutic benefit if there is any sludging in the duct. Main concern is long-term care. He will likely undergo this problem chronically/intermittently unless the primary mass is dealt with. I am not certain he would even be willing to undergo any intervention for this but he will be at high risk for recurrent hospitalizations and infections going forward.   Will continue zosyn for now pending final ID/sensitivities. Will repeat blood cultures to ensure clearance of infection.   Also picked up staph species on one blood culture bottle - no mecA gene detected; likely skin contaminant.   Welcomed/answered all questions from patient and his family.    Plan: 1. Continue zosyn 2. Repeat blood cultures now (ordered)  3. ERCP per GI team    Principal Problem:   Biliary sepsis Active Problems:   Essential hypertension   Diabetes mellitus, type 2 (HCC)   Elevated LFTs   Hypokalemia   Acute kidney injury ([place])   Dementia (HCC)   Cholangiocarcinoma (HCC)   Colitis   Hypomagnesemia   Hypophosphatemia   Acute cholangitis   . [START ON 06/26/2019] enoxaparin (LOVENOX) injection  40 mg Subcutaneous Q24H  . finasteride  5 mg Oral Daily  . insulin aspart  0-5 Units Subcutaneous  QHS  . insulin aspart  0-9 Units Subcutaneous TID WC  . tamsulosin  0.4 mg Oral Daily    HPI: Ricardo Washington is a 84 y.o. male admitted with severe abrupt onset stomach pain.   He and his wife are in the room to facilitate the history; his son Ricardo Washington joins on the phone with their permission and per their request.   Ricardo Washington was in his usual state of health at home when he experienced sudden onset severe abdominal and upper back pain the morning of admission. He asked his wife to "call 911 now." He did not experience any fevers or shaking chills prior to this pain nor since. He sees Dr. [name] outpatient with GI team who has been following this mass described on MRCP regularly. His son says that his LFTs have been periodically elevated over the last few months. They were significantly elevated with presentation to the ER with AST 1061 ALT 463 AlkPh 256 and Tbili 2.0. CT of the chest/abd/pelvis revealed stable biliary dilation within liver, mild decreased attenuation noted centrally that was similar to previous exam. Wall thickening to rectosigmoid c/w focal colitis. He denies any lower abdominal symptoms or diarrhea.  He was afebrile and normotensive, non-tachycardic and non-hypoxic. No respiratory complaints. COVID-19 test negative. Blood cultures revealed an ecoli bacteremia in both sets as well as gram positive cocci/staph species in one set that has yet to be identified but not  staph aureus. He is currently on zosyn IV and doing well tolerating this.   Most questions surround how long he will be here, what kind of treatment he needs. No recent antibiotic use per their report or chart review for any reason.   Review of Systems: Review of Systems  Constitutional: Positive for weight loss (reported by wife d/t "pickey eater"). Negative for fever and malaise/fatigue.  HENT: Positive for hearing loss. Negative for sore throat.   Respiratory: Negative for cough.   Cardiovascular: Negative for chest pain and  leg swelling.  Gastrointestinal: Positive for abdominal pain. Negative for diarrhea, nausea and vomiting.  Genitourinary: Negative for dysuria.  Musculoskeletal: Negative for back pain and joint pain.  Skin: Negative for rash.  Neurological: Negative for dizziness and weakness.  Psychiatric/Behavioral: Positive for memory loss (dementia).    Past Medical History:  Diagnosis Date  . BPH with obstruction/lower urinary tract symptoms 10/27/2015   Dr. Karsten Ro  . Cataract   . Colon wall thickening 04/2018   "mass-like" per radiologist interpretation; colonoscopy as next step---f/u colonoscopy showed 'tics but o/w normal.  NO further colonoscopies needed due to age.  . Coronary artery disease    with preserved LV function.  . Dementia ([place])   . Diabetes mellitus without complication ([place])   . Diverticulosis of colon    severe, entire colon  . Dysphagia 11/2018   Barium swallow: mild esophageal dysmotility.  . Ectatic abdominal aorta ([place]) 01/2017   Abd u/s: 2.9 cm aortic ectasia--at risk for aneurism development.  Recheck aortic u/s 5 yrs.  . Erectile dysfunction due to arterial insufficiency   . Fatigue   . Gout    always 2nd toe L foot (uric acid 6.20 Dec 2014 per old records)  . Hepatic steatosis   . History of adenomatous polyp of colon 2002  . History of stomach ulcers   . Hyperlipidemia   . Hypertension   . Hypogonadism male   . Klebsiella sepsis ([place]) 01/2017   due to acute biliary tract infection (no stones) and acute diverticulitis.  . Liver mass 04/2018   GI ref: MRI abd-->?early cholangiocarcinoma-->tissue dx vs PET, vs repeat MRI 3 mo.  GI recommended PET-->results suggestive of cholangiocarcinoma.  Not amenable to bx by IR or GI.  Poss retry by Dr. [name] in GI--pt decl 06/2018.Marland Kitchen  PET by Onc 08/20/18-->focus of activity centrally within liver remains but is slightly less focal and intense, no new liver lesions, no mets.  Repeat PET 02/2019.  . Macrocytic anemia 01/2017    vit B12 borderline low and iron borderline low: checking hemoccults and starting vit B12 PO and iron PO as of 02/11/17.  Vit B12 and folate normal as of GI f/u 06/2018.  . Myogenic ptosis of bilateral eyelids 2018   Plastic surgery in [place], [place] to do surg as of 03/2017.  Marland Kitchen NASH (nonalcoholic steatohepatitis) 06/2017   LFTs up, abd u/s showed fatty liver but no other abnormality.  . Osteoarthritis of right shoulder 09/2018   Near end-stage --->glenohumeral joint-->intra-articular steroid injection 09/2018 (Dr. Delilah Shan).  . Osteoarthritis of right shoulder    11/2018-->end stage, but not ready for total shoulder replacement.  . Past use of tobacco    quit 1984  . Rectal bleeding   . Rosacea   . S/P coronary artery bypass graft x 3   . Urine incontinence    Dr. Karsten Ro    Social History   Tobacco Use  . Smoking status: Former Smoker  Packs/day: 1.00    Years: 30.00    Pack years: 30.00    Types: Cigarettes    Quit date: 03/15/1983    Years since quitting: 36.3  . Smokeless tobacco: Never Used  . Tobacco comment: Quit 1984  Substance Use Topics  . Alcohol use: Yes    Alcohol/week: 5.0 - 7.0 standard drinks    Types: 5 - 7 Glasses of wine per week    Comment: quit etoh 1999 but in ~ 2017 began having a glass of wine with dinner   . Drug use: No    Family History  Problem Relation Age of Onset  . Cancer Mother   . Cancer Father        pt points to LLQ as  area of cancer, so potentially could have been intestinal.     . Colon cancer Neg Hx   . Esophageal cancer Neg Hx   . Stomach cancer Neg Hx   . Rectal cancer Neg Hx    Allergies  Allergen Reactions  . Sulfonamide Derivatives     Unknown, per pt and "cant stand it"    OBJECTIVE: Blood pressure 125/65, pulse 75, temperature 98.1 F (36.7 C), temperature source Oral, resp. rate 18, weight 75 kg, SpO2 99 %.  Physical Exam Vitals signs reviewed.  Constitutional:      Comments: He is seated comfortably in the recliner  awaiting lunch. No distress. His wife is at the bedside.   Cardiovascular:     Rate and Rhythm: Normal rate.     Heart sounds: No murmur.  Pulmonary:     Effort: Pulmonary effort is normal.  Abdominal:     General: There is distension.     Tenderness: There is no abdominal tenderness.  Musculoskeletal:        General: No swelling or tenderness.     Right lower leg: No edema.     Left lower leg: No edema.  Skin:    General: Skin is warm and dry.     Capillary Refill: Capillary refill takes less than 2 seconds.  Neurological:     Mental Status: He is disoriented.     Comments: Frequently needs reorientation as to plan/hospitalization/people in his room unfamiliar to him  Psychiatric:        Mood and Affect: Mood normal.     Lab Results Lab Results  Component Value Date   WBC 9.4 06/25/2019   HGB 11.4 (L) 06/25/2019   HCT 33.8 (L) 06/25/2019   MCV 100.0 06/25/2019   PLT 130 (L) 06/25/2019    Lab Results  Component Value Date   CREATININE 1.40 (H) 06/25/2019   BUN 12 06/25/2019   NA 137 06/25/2019   K 4.0 06/25/2019   CL 106 06/25/2019   CO2 21 (L) 06/25/2019    Lab Results  Component Value Date   ALT 352 (H) 06/25/2019   AST 261 (H) 06/25/2019   ALKPHOS 186 (H) 06/25/2019   BILITOT 1.0 06/25/2019     Microbiology: Recent Results (from the past 240 hour(s))  Blood culture (routine x 2)     Status: None (Preliminary result)   Collection Time: 06/23/19  8:58 PM   Specimen: BLOOD LEFT FOREARM  Result Value Ref Range Status   Specimen Description BLOOD LEFT FOREARM  Final   Special Requests   Final    BOTTLES DRAWN AEROBIC AND ANAEROBIC Blood Culture adequate volume   Culture  Setup Time   Final    ANAEROBIC BOTTLE  ONLY GRAM NEGATIVE RODS CRITICAL VALUE NOTED.  VALUE IS CONSISTENT WITH PREVIOUSLY REPORTED AND CALLED VALUE.    Culture   Final    GRAM NEGATIVE RODS IDENTIFICATION AND SUSCEPTIBILITIES TO FOLLOW Performed at [facility], [place] [address] [place], [place] [address]    Report Status PENDING  Incomplete  Blood culture (routine x 2)     Status: Abnormal (Preliminary result)   Collection Time: 06/23/19  9:03 PM   Specimen: BLOOD  Result Value Ref Range Status   Specimen Description BLOOD RIGHT ANTECUBITAL  Final   Special Requests   Final    BOTTLES DRAWN AEROBIC AND ANAEROBIC Blood Culture adequate volume   Culture  Setup Time   Final    GRAM NEGATIVE RODS ANAEROBIC BOTTLE ONLY CRITICAL RESULT CALLED TO, READ BACK BY AND VERIFIED WITH: Reece Packer PharmD 17:10 06/24/19 ([email or web address]) AEROBIC BOTTLE ONLY GRAM POSITIVE COCCI CRITICAL RESULT CALLED TO, READ BACK BY AND VERIFIED WITH: L SEAY PHARMD 06/24/19 2342 JDW    Culture (A)  Final    ESCHERICHIA COLI SUSCEPTIBILITIES TO FOLLOW GRAM POSITIVE COCCI IDENTIFICATION TO FOLLOW Performed at [facility], [place] [address] [place], [place] [address]    Report Status PENDING  Incomplete  Blood Culture ID Panel (Reflexed)     Status: Abnormal   Collection Time: 06/23/19  9:03 PM  Result Value Ref Range Status   Enterococcus species NOT DETECTED NOT DETECTED Final   Listeria monocytogenes NOT DETECTED NOT DETECTED Final   Staphylococcus species NOT DETECTED NOT DETECTED Final   Staphylococcus aureus (BCID) NOT DETECTED NOT DETECTED Final   Streptococcus species NOT DETECTED NOT DETECTED Final   Streptococcus agalactiae NOT DETECTED NOT DETECTED Final   Streptococcus pneumoniae NOT DETECTED NOT DETECTED Final   Streptococcus pyogenes NOT DETECTED NOT DETECTED Final   Acinetobacter baumannii NOT DETECTED NOT DETECTED Final   Enterobacteriaceae species DETECTED (A) NOT DETECTED Final    Comment: Enterobacteriaceae represent a large family of gram-negative bacteria, not a single organism. CRITICAL RESULT CALLED TO, READ BACK BY AND VERIFIED WITH: Reece Packer PharmD 17:10 06/24/19 ([email or web address])    Enterobacter cloacae complex NOT DETECTED NOT DETECTED Final   Escherichia coli DETECTED (A)  NOT DETECTED Final    Comment: CRITICAL RESULT CALLED TO, READ BACK BY AND VERIFIED WITH: Reece Packer PharmD 17:10 06/24/19 ([email or web address])    Klebsiella oxytoca NOT DETECTED NOT DETECTED Final   Klebsiella pneumoniae NOT DETECTED NOT DETECTED Final   Proteus species NOT DETECTED NOT DETECTED Final   Serratia marcescens NOT DETECTED NOT DETECTED Final   Carbapenem resistance NOT DETECTED NOT DETECTED Final   Haemophilus influenzae NOT DETECTED NOT DETECTED Final   Neisseria meningitidis NOT DETECTED NOT DETECTED Final   Pseudomonas aeruginosa NOT DETECTED NOT DETECTED Final   Candida albicans NOT DETECTED NOT DETECTED Final   Candida glabrata NOT DETECTED NOT DETECTED Final   Candida krusei NOT DETECTED NOT DETECTED Final   Candida parapsilosis NOT DETECTED NOT DETECTED Final   Candida tropicalis NOT DETECTED NOT DETECTED Final    Comment: Performed at [facility], [place] [address] [place], [place] [address]  Blood Culture ID Panel (Reflexed)     Status: Abnormal   Collection Time: 06/23/19  9:03 PM  Result Value Ref Range Status   Enterococcus species NOT DETECTED NOT DETECTED Final   Listeria monocytogenes NOT DETECTED NOT DETECTED Final   Staphylococcus species DETECTED (A) NOT DETECTED Final    Comment: Methicillin (oxacillin) susceptible  coagulase negative staphylococcus. Possible blood culture contaminant (unless isolated from more than one blood culture draw or clinical case suggests pathogenicity). No antibiotic treatment is indicated for blood  culture contaminants. CRITICAL RESULT CALLED TO, READ BACK BY AND VERIFIED WITH: L SEAY PHARMD 06/24/19 2342 JDW    Staphylococcus aureus (BCID) NOT DETECTED NOT DETECTED Final   Methicillin resistance NOT DETECTED NOT DETECTED Final   Streptococcus species NOT DETECTED NOT DETECTED Final   Streptococcus agalactiae NOT DETECTED NOT DETECTED Final   Streptococcus pneumoniae NOT DETECTED NOT DETECTED Final   Streptococcus pyogenes NOT  DETECTED NOT DETECTED Final   Acinetobacter baumannii NOT DETECTED NOT DETECTED Final   Enterobacteriaceae species NOT DETECTED NOT DETECTED Final   Enterobacter cloacae complex NOT DETECTED NOT DETECTED Final   Escherichia coli NOT DETECTED NOT DETECTED Final   Klebsiella oxytoca NOT DETECTED NOT DETECTED Final   Klebsiella pneumoniae NOT DETECTED NOT DETECTED Final   Proteus species NOT DETECTED NOT DETECTED Final   Serratia marcescens NOT DETECTED NOT DETECTED Final   Haemophilus influenzae NOT DETECTED NOT DETECTED Final   Neisseria meningitidis NOT DETECTED NOT DETECTED Final   Pseudomonas aeruginosa NOT DETECTED NOT DETECTED Final   Candida albicans NOT DETECTED NOT DETECTED Final   Candida glabrata NOT DETECTED NOT DETECTED Final   Candida krusei NOT DETECTED NOT DETECTED Final   Candida parapsilosis NOT DETECTED NOT DETECTED Final   Candida tropicalis NOT DETECTED NOT DETECTED Final    Comment: Performed at [facility], [place] [address], [place], [place] [address]  SARS Coronavirus 2 [facility] order, Performed in [facility] hospital lab) Nasopharyngeal Nasopharyngeal Swab     Status: None   Collection Time: 06/23/19  9:56 PM   Specimen: Nasopharyngeal Swab  Result Value Ref Range Status   SARS Coronavirus 2 NEGATIVE NEGATIVE Final    Comment: (NOTE) If result is NEGATIVE SARS-CoV-2 target nucleic acids are NOT DETECTED. The SARS-CoV-2 RNA is generally detectable in upper and lower  respiratory specimens during the acute phase of infection. The lowest  concentration of SARS-CoV-2 viral copies this assay can detect is 250  copies / mL. A negative result does not preclude SARS-CoV-2 infection  and should not be used as the sole basis for treatment or other  patient management decisions.  A negative result may occur with  improper specimen collection / handling, submission of specimen other  than nasopharyngeal swab, presence of viral mutation(s) within the  areas targeted  by this assay, and inadequate number of viral copies  (<250 copies / mL). A negative result must be combined with clinical  observations, patient history, and epidemiological information. If result is POSITIVE SARS-CoV-2 target nucleic acids are DETECTED. The SARS-CoV-2 RNA is generally detectable in upper and lower  respiratory specimens dur ing the acute phase of infection.  Positive  results are indicative of active infection with SARS-CoV-2.  Clinical  correlation with patient history and other diagnostic information is  necessary to determine patient infection status.  Positive results do  not rule out bacterial infection or co-infection with other viruses. If result is PRESUMPTIVE POSTIVE SARS-CoV-2 nucleic acids MAY BE PRESENT.   A presumptive positive result was obtained on the submitted specimen  and confirmed on repeat testing.  While 2019 novel coronavirus  (SARS-CoV-2) nucleic acids may be present in the submitted sample  additional confirmatory testing may be necessary for epidemiological  and / or clinical management purposes  to differentiate between  SARS-CoV-2 and other Sarbecovirus currently known  to infect humans.  If clinically indicated additional testing with an alternate test  methodology [phone]) is advised. The SARS-CoV-2 RNA is generally  detectable in upper and lower respiratory sp ecimens during the acute  phase of infection. The expected result is Negative. Fact Sheet for Patients:  StrictlyIdeas.no Fact Sheet for Healthcare Providers: BankingDealers.co.za This test is not yet approved or cleared by the [place] FDA and has been authorized for detection and/or diagnosis of SARS-CoV-2 by FDA under an Emergency Use Authorization (EUA).  This EUA will remain in effect (meaning this test can be used) for the duration of the COVID-19 declaration under Section 564(b)(1) of the Act, 21 U.S.C. section  360bbb-3(b)(1), unless the authorization is terminated or revoked sooner. Performed at [facility], [place] [address] [place], [place] [address]     Janene Madeira, MSN, NP-C [facility] [place] Cell: [phone] Pager: [phone]  06/25/2019 1:58 PM

## 2019-06-25 NOTE — H&P (View-Only) (Signed)
Patient ID: Ricardo Washington, male   DOB: 02/23/1935, 84 y.o.   MRN: [id number] ° ° ° Progress Note ° ° Subjective  °Day # 2 °CC; abd pain, elevated LFTs ° °Patient up in chair, wife at bedside.  Patient has no current complaints of abdominal pain, no nausea or vomiting. ° ° °Today-T bili 1.0/alk phos 186/ALT/352/AST 261 °Creatinine 1.4 °WBC 9.4, hemoglobin 11.4 ° °Blood cultures positive Enterobacter and E. Coli-sensitivity pending °On Zosyn ° ° ° Objective  ° °Vital signs in last 24 hours: °Temp:  [98.1 °F (36.7 °C)-98.4 °F (36.9 °C)] 98.1 °F (36.7 °C) (08/06 1145) °Pulse Rate:  [71-77] 75 (08/06 1145) °Resp:  [16-19] 18 (08/06 1145) °BP: (99-128)/(43-65) 125/65 (08/06 1145) °SpO2:  [96 %-99 %] 99 % (08/06 1145) °  °General:    Elderly white male in NAD, pleasant °Heart:  Regular rate and rhythm; no murmurs °Lungs: Respirations even and unlabored, lungs CTA bilaterally °Abdomen:  Soft, mildly tender right upper quadrant and nondistended. Normal bowel sounds. °Extremities:  Without edema. °Neurologic:  Alert and oriented,  grossly normal neurologically, mild dementia °Psych:  Cooperative. Normal mood and affect. ° °Intake/Output from previous day: °08/05 0701 - 08/06 0700 °In: 60 [P.O.:60] °Out: 1768 [Urine:1768] °Intake/Output this shift: °Total I/O °In: -  °Out: 400 [Urine:400] ° °Lab Results: °Recent Labs  °  06/23/19 °1717 06/24/19 °0223 06/25/19 °0324  °WBC 7.6 17.7* 9.4  °HGB 14.4 12.5* 11.4*  °HCT 42.9 37.3* 33.8*  °PLT 134* 146* 130*  ° °BMET °Recent Labs  °  06/23/19 °1717 06/24/19 °0223 06/25/19 °0324  °NA 139 140 137  °K 3.7 3.4* 4.0  °CL 107 106 106  °CO2 19* 22 21*  °GLUCOSE 162* 122* 93  °BUN 15 15 12  °CREATININE 1.17 1.30* 1.40*  °CALCIUM 8.5* 7.9* 7.6*  ° °LFT °Recent Labs  °  06/24/19 °0223 06/25/19 °0324  °PROT 5.6* 5.1*  °ALBUMIN 2.9* 2.6*  °AST 1,075* 261*  °ALT 675* 352*  °ALKPHOS 255* 186*  °BILITOT 2.5* 1.0  °BILIDIR 1.4*  --   ° °PT/INR °Recent Labs  °  06/24/19 °0223  °LABPROT 17.5*  °INR  1.5*  ° ° °Studies/Results: °Mr 3d Recon At Scanner ° °Result Date: 06/25/2019 °CLINICAL DATA:  Abn liver function tests (LFTs) Abd pain, extrahepatic cholangiocarcinoma suspected7ml of gadavist. 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 Now presenting with abdominal pain, significantly elevated liver enzymes and leukocytosis. No fever. EXAM: MRI ABDOMEN WITHOUT AND WITH CONTRAST (INCLUDING MRCP) TECHNIQUE: Multiplanar multisequence MR imaging of the abdomen was performed both before and after the administration of intravenous contrast. Heavily T2-weighted images of the biliary and pancreatic ducts were obtained, and three-dimensional MRCP images were rendered by post processing. CONTRAST:  Seven mL Gadavist COMPARISON:  CT 06/23/2019, MRI 05/03/2018, PET-CT 05/18/2019 FINDINGS: Lower chest:  Small bilateral pleural effusions. Hepatobiliary: There is mild duct dilatation within the RIGHT hepatic lobe (segment 8/6) which is similar to comparison MRI 05/03/2018 (image 19/1)7. There is minimal contrast enhancement through this region of obstruction. Lesion is better identified on the comparison FDG PET scan. The MRCP sequence do demonstrate interruption of the ducts in the same segment. The distal common bile duct is normal caliber. No new lesions identified. Pancreas: Normal pancreatic parenchymal intensity. No ductal dilatation or inflammation. Spleen: Normal spleen. Adrenals/urinary tract: Adrenal glands and kidneys are normal. Stomach/Bowel: Stomach and limited of the small bowel is unremarkable Vascular/Lymphatic: Abdominal aortic normal caliber. No retroperitoneal periportal   lymphadenopathy. Musculoskeletal: No aggressive osseous lesion IMPRESSION: 1. Persistent duct dilatation in segment 8/6 of the RIGHT hepatic lobe with suspicion of region central obstruction identified by hypermetabolic tissue on comparison FDG PET scan. The MRI findings are very  similar to MRI of 05/03/2018. The tumor is very poorly demonstrated on current exam. There is some limitation to the imaging due to patient body motion. 2. Normal distal common bile duct.  Normal pancreas. Electronically Signed   By: Stewart  Edmunds M.D.   On: 06/25/2019 07:57  ° °Dg Chest Port 1 View ° °Result Date: 06/23/2019 °CLINICAL DATA:  Chest pain, back pain, constipation for 1 day EXAM: PORTABLE CHEST 1 VIEW COMPARISON:  Radiograph May 05, 2011 FINDINGS: Postsurgical changes related to prior CABG including intact and aligned sternotomy wires and multiple surgical clips projecting over the mediastinum. Cardiac silhouette is borderline enlarged linear opacities in the right lung base likely reflect subsegmental atelectasis no consolidation, features of edema, pneumothorax, or effusion. Pulmonary vascularity is normally distributed. No acute osseous or soft tissue abnormality. Degenerative changes are present in the and imaged spine and shoulders. Cardiac leads overlie the chest. IMPRESSION: No acute cardiopulmonary abnormality Electronically Signed   By: Price  DeHay M.D.   On: 06/23/2019 17:04  ° °Mr Abdomen Mrcp W Wo Contast ° °Result Date: 06/25/2019 °CLINICAL DATA:  Abn liver function tests (LFTs) Abd pain, extrahepatic cholangiocarcinoma suspected7ml of gadavist. 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 Now presenting with abdominal pain, significantly elevated liver enzymes and leukocytosis. No fever. EXAM: MRI ABDOMEN WITHOUT AND WITH CONTRAST (INCLUDING MRCP) TECHNIQUE: Multiplanar multisequence MR imaging of the abdomen was performed both before and after the administration of intravenous contrast. Heavily T2-weighted images of the biliary and pancreatic ducts were obtained, and three-dimensional MRCP images were rendered by post processing. CONTRAST:  Seven mL Gadavist COMPARISON:  CT 06/23/2019, MRI 05/03/2018, PET-CT 05/18/2019 FINDINGS:  Lower chest:  Small bilateral pleural effusions. Hepatobiliary: There is mild duct dilatation within the RIGHT hepatic lobe (segment 8/6) which is similar to comparison MRI 05/03/2018 (image 19/1)7. There is minimal contrast enhancement through this region of obstruction. Lesion is better identified on the comparison FDG PET scan. The MRCP sequence do demonstrate interruption of the ducts in the same segment. The distal common bile duct is normal caliber. No new lesions identified. Pancreas: Normal pancreatic parenchymal intensity. No ductal dilatation or inflammation. Spleen: Normal spleen. Adrenals/urinary tract: Adrenal glands and kidneys are normal. Stomach/Bowel: Stomach and limited of the small bowel is unremarkable Vascular/Lymphatic: Abdominal aortic normal caliber. No retroperitoneal periportal lymphadenopathy. Musculoskeletal: No aggressive osseous lesion IMPRESSION: 1. Persistent duct dilatation in segment 8/6 of the RIGHT hepatic lobe with suspicion of region central obstruction identified by hypermetabolic tissue on comparison FDG PET scan. The MRI findings are very similar to MRI of 05/03/2018. The tumor is very poorly demonstrated on current exam. There is some limitation to the imaging due to patient body motion. 2. Normal distal common bile duct.  Normal pancreas. Electronically Signed   By: Stewart  Edmunds M.D.   On: 06/25/2019 07:57  ° °Ct Angio Chest/abd/pel For Dissection W And/or Wo Contrast ° °Result Date: 06/23/2019 °CLINICAL DATA:  Chest and back pain for 1 day, no known injury, initial encounter EXAM: CT ANGIOGRAPHY CHEST, ABDOMEN AND PELVIS TECHNIQUE: Multidetector CT imaging through the chest, abdomen and pelvis was performed using the standard protocol during bolus administration of intravenous contrast. Multiplanar reconstructed images and MIPs were obtained and reviewed   to evaluate the vascular anatomy. CONTRAST:  100mL OMNIPAQUE IOHEXOL 350 MG/ML SOLN COMPARISON:  None. FINDINGS: CTA  CHEST FINDINGS Cardiovascular: Thoracic aorta demonstrates bovine branching anatomy. Mild atherosclerotic changes are seen without aneurysmal dilatation or dissection. Changes of prior coronary bypass grafting are seen. No cardiac enlargement is noted. Pulmonary artery is prominent and shows no definitive central pulmonary embolus although timing was not performed for embolus evaluation. Coronary calcifications are noted. No pericardial effusion is noted. Mediastinum/Nodes: Thoracic inlet is within normal limits. No hilar or mediastinal adenopathy is noted. No mediastinal hematoma is seen. The esophagus as visualized is within normal limits. Lungs/Pleura: The lungs are well aerated bilaterally. No focal infiltrate or sizable effusion is seen. Mild bibasilar atelectatic changes are noted. No sizable parenchymal nodules are seen. Musculoskeletal: Degenerative changes of the thoracic spine are noted. No acute rib abnormality is seen. Prior median sternotomy is noted. Review of the MIP images confirms the above findings. CTA ABDOMEN AND PELVIS FINDINGS VASCULAR Aorta: The abdominal aorta demonstrates atherosclerotic calcifications without aneurysmal dilatation or focal dissection. No extravasation is seen. Celiac: Calcifications are noted at the origin of the celiac axis although no focal stenosis is noted. SMA: Calcifications are noted at the origin of the superior mesenteric artery although no focal stenosis is seen. Renals: Single renal arteries are identified bilaterally with mild narrowing at the origins. IMA: Patent without evidence of aneurysm, dissection, vasculitis or significant stenosis. Iliacs: Iliacs are widely patent without aneurysmal dilatation or dissection. Veins: No vein abnormality is noted. Review of the MIP images confirms the above findings. NON-VASCULAR Hepatobiliary: Liver demonstrates changes of mild fatty infiltration. Mild segmental biliary dilatation is again noted similar to that seen on  the prior exam. The previously seen central hypodense lesion is again identified but stable. No significant increase in biliary dilatation is noted. The gallbladder is unremarkable. Pancreas: Unremarkable. No pancreatic ductal dilatation or surrounding inflammatory changes. Spleen: Normal in size without focal abnormality. Adrenals/Urinary Tract: Adrenal glands are within normal limits. Kidneys demonstrate a normal enhancement pattern bilaterally. No obstructive changes are seen. The bladder is well distended. Stomach/Bowel: Diverticular change of the colon is noted. Diffuse wall thickening is noted within the rectosigmoid region consistent with focal colitis. No perforation or abscess is noted. No significant pericolonic inflammatory changes are seen. The appendix is well visualized and within normal limits. No small bowel or gastric abnormality is noted. Lymphatic: No significant lymphadenopathy is noted. Reproductive: Prostate is unremarkable. Other: No abdominal wall hernia or abnormality. No abdominopelvic ascites. Musculoskeletal: Degenerative changes of lumbar spine are noted. No compression deformity is seen. Review of the MIP images confirms the above findings. IMPRESSION: No evidence of aneurysmal dilatation or dissection within the thoracic and abdominal aorta. Stable segmental biliary dilatation within segment 8 of the liver stable from the prior exam. Mild decreased attenuation is noted centrally similar to that seen on prior MRI. No significant enlargement is noted. Wall thickening within the rectosigmoid consistent with focal colitis. No abscess or perforation is noted. Chronic changes as described above. Electronically Signed   By: Mark  Lukens M.D.   On: 06/23/2019 19:54  ° ° ° ° ° Assessment / Plan:   ° °#1 84-year-old white male with history of presumed cholangiocarcinoma, who has been monitored over the past year as LFTs have been normal and he was asymptomatic. °He presents now with an episode  of acute abdominal pain, noted to have significantly elevated LFTs and leukocytosis.  Picture consistent with acute cholangitis secondary to biliary obstruction ° °  Blood cultures have returned positive for Enterobacter and E. Coli. ° °MRCP/MRI shows mild ductal dilation within the right hepatic lobe with minimal contrast enhancement throughout this region there is interruption of the ducts in the same segment, distal common  duct normal caliber ° °Patient has been on Zosyn, is afebrile and LFTs have improved overnight. ° °#2 dementia °#3 history of coronary artery disease °#4 adult onset diabetes mellitus ° ° °Plan; Patient is scheduled for ERCP, with brushings and stent placement with Dr. Mansouraty in a.m. tomorrow.  Procedure was discussed in detail with the patient, patient's wife and patient's son by phone this afternoon.  I have reviewed the procedure, and potential complications and they are agreeable to proceed. ° °Hold Lovenox tonight °Continue Zosyn ° ° ° °Principal Problem: °  Biliary sepsis °Active Problems: °  Essential hypertension °  Diabetes mellitus, type 2 (HCC) °  Elevated LFTs °  Hypokalemia °  Acute kidney injury (HCC) °  Dementia (HCC) °  Cholangiocarcinoma (HCC) °  Colitis °  Hypomagnesemia °  Hypophosphatemia °  Acute cholangitis ° ° ° ° LOS: 1 day  ° °Liadan Guizar EsterwoodPA-C  06/25/2019, 2:02 PM ° °

## 2019-06-26 ENCOUNTER — Encounter (HOSPITAL_COMMUNITY): Payer: Self-pay | Admitting: Certified Registered Nurse Anesthetist

## 2019-06-26 ENCOUNTER — Inpatient Hospital Stay (HOSPITAL_COMMUNITY): Payer: Medicare Other | Admitting: Anesthesiology

## 2019-06-26 ENCOUNTER — Encounter (HOSPITAL_COMMUNITY)
Admission: EM | Disposition: A | Discharge status: Home Health | Payer: Self-pay | Source: Home / Self Care | Attending: Internal Medicine

## 2019-06-26 ENCOUNTER — Inpatient Hospital Stay (HOSPITAL_COMMUNITY): Payer: Medicare Other

## 2019-06-26 DIAGNOSIS — K8031 Calculus of bile duct with cholangitis, unspecified, with obstruction: Secondary | ICD-10-CM

## 2019-06-26 DIAGNOSIS — B961 Klebsiella pneumoniae [K. pneumoniae] as the cause of diseases classified elsewhere: Secondary | ICD-10-CM

## 2019-06-26 DIAGNOSIS — R7881 Bacteremia: Secondary | ICD-10-CM

## 2019-06-26 DIAGNOSIS — K838 Other specified diseases of biliary tract: Secondary | ICD-10-CM

## 2019-06-26 HISTORY — PX: BILIARY DILATION: SHX6850

## 2019-06-26 HISTORY — PX: BILIARY STENT PLACEMENT: SHX5538

## 2019-06-26 HISTORY — PX: ERCP: SHX5425

## 2019-06-26 HISTORY — PX: REMOVAL OF STONES: SHX5545

## 2019-06-26 HISTORY — PX: BILIARY BRUSHING: SHX6843

## 2019-06-26 HISTORY — PX: SPHINCTEROTOMY: SHX5544

## 2019-06-26 LAB — GLUCOSE, CAPILLARY
Glucose-Capillary: 125 mg/dL — ABNORMAL HIGH (ref 70–99)
Glucose-Capillary: 122 mg/dL — ABNORMAL HIGH (ref 70–99)
Glucose-Capillary: 130 mg/dL — ABNORMAL HIGH (ref 70–99)
Glucose-Capillary: 103 mg/dL — ABNORMAL HIGH (ref 70–99)
Glucose-Capillary: 83 mg/dL (ref 70–99)

## 2019-06-26 LAB — CBC WITH DIFFERENTIAL/PLATELET
Abs Immature Granulocytes: 0.05 10*3/uL (ref 0.00–0.07)
Abs Immature Granulocytes: 0.04 10*3/uL (ref 0.00–0.07)
Basophils Absolute: 0 10*3/uL (ref 0.0–0.1)
Basophils Absolute: 0 10*3/uL (ref 0.0–0.1)
Basophils Relative: 0 %
Basophils Relative: 0 %
Eosinophils Absolute: 0.1 10*3/uL (ref 0.0–0.5)
Eosinophils Absolute: 0.1 10*3/uL (ref 0.0–0.5)
Eosinophils Relative: 2 %
Eosinophils Relative: 1 %
HCT: 34.4 % — ABNORMAL LOW (ref 39.0–52.0)
HCT: 35.5 % — ABNORMAL LOW (ref 39.0–52.0)
Hemoglobin: 11.7 g/dL — ABNORMAL LOW (ref 13.0–17.0)
Hemoglobin: 12.1 g/dL — ABNORMAL LOW (ref 13.0–17.0)
Immature Granulocytes: 1 %
Immature Granulocytes: 1 %
Lymphocytes Relative: 10 %
Lymphocytes Relative: 15 %
Lymphs Abs: 0.9 10*3/uL (ref 0.7–4.0)
Lymphs Abs: 1.4 10*3/uL (ref 0.7–4.0)
MCH: 34.4 pg — ABNORMAL HIGH (ref 26.0–34.0)
MCH: 34.3 pg — ABNORMAL HIGH (ref 26.0–34.0)
MCHC: 34 g/dL (ref 30.0–36.0)
MCHC: 34.1 g/dL (ref 30.0–36.0)
MCV: 101.2 fL — ABNORMAL HIGH (ref 80.0–100.0)
MCV: 100.6 fL — ABNORMAL HIGH (ref 80.0–100.0)
Monocytes Absolute: 0.5 10*3/uL (ref 0.1–1.0)
Monocytes Absolute: 0.7 10*3/uL (ref 0.1–1.0)
Monocytes Relative: 6 %
Monocytes Relative: 8 %
Neutro Abs: 6.9 10*3/uL (ref 1.7–7.7)
Neutro Abs: 6.7 10*3/uL (ref 1.7–7.7)
Neutrophils Relative %: 81 %
Neutrophils Relative %: 75 %
Platelets: 135 10*3/uL — ABNORMAL LOW (ref 150–400)
Platelets: 144 10*3/uL — ABNORMAL LOW (ref 150–400)
RBC: 3.4 MIL/uL — ABNORMAL LOW (ref 4.22–5.81)
RBC: 3.53 MIL/uL — ABNORMAL LOW (ref 4.22–5.81)
RDW: 14.6 % (ref 11.5–15.5)
RDW: 14.4 % (ref 11.5–15.5)
WBC: 8.6 10*3/uL (ref 4.0–10.5)
WBC: 8.9 10*3/uL (ref 4.0–10.5)
nRBC: 0 % (ref 0.0–0.2)
nRBC: 0 % (ref 0.0–0.2)

## 2019-06-26 LAB — COMPREHENSIVE METABOLIC PANEL
ALT: 220 U/L — ABNORMAL HIGH (ref 0–44)
AST: 80 U/L — ABNORMAL HIGH (ref 15–41)
Albumin: 2.5 g/dL — ABNORMAL LOW (ref 3.5–5.0)
Alkaline Phosphatase: 162 U/L — ABNORMAL HIGH (ref 38–126)
Anion gap: 10 (ref 5–15)
BUN: 10 mg/dL (ref 8–23)
CO2: 22 mmol/L (ref 22–32)
Calcium: 7.9 mg/dL — ABNORMAL LOW (ref 8.9–10.3)
Chloride: 105 mmol/L (ref 98–111)
Creatinine, Ser: 1.36 mg/dL — ABNORMAL HIGH (ref 0.61–1.24)
GFR calc Af Amer: 56 mL/min — ABNORMAL LOW (ref >60)
GFR calc non Af Amer: 48 mL/min — ABNORMAL LOW (ref >60)
Glucose, Bld: 133 mg/dL — ABNORMAL HIGH (ref 70–99)
Potassium: 4 mmol/L (ref 3.5–5.1)
Sodium: 137 mmol/L (ref 135–145)
Total Bilirubin: 0.8 mg/dL (ref 0.3–1.2)
Total Protein: 5.1 g/dL — ABNORMAL LOW (ref 6.5–8.1)

## 2019-06-26 LAB — CULTURE, BLOOD (ROUTINE X 2)
Special Requests: ADEQUATE
Special Requests: ADEQUATE

## 2019-06-26 LAB — SURGICAL PCR SCREEN
MRSA, PCR: NEGATIVE
Staphylococcus aureus: NEGATIVE

## 2019-06-26 LAB — PROTIME-INR
INR: 1.2 (ref 0.8–1.2)
Prothrombin Time: 14.6 seconds (ref 11.4–15.2)

## 2019-06-26 SURGERY — ERCP, WITH INTERVENTION IF INDICATED
Anesthesia: General

## 2019-06-26 MED ORDER — INDOMETHACIN 50 MG RE SUPP
RECTAL | Status: DC | PRN
  Administered 2019-06-26: 100 mg via RECTAL

## 2019-06-26 MED ORDER — INDOMETHACIN 50 MG RE SUPP
RECTAL | Status: AC
  Filled 2019-06-26: qty 2

## 2019-06-26 MED ORDER — ROCURONIUM BROMIDE 100 MG/10ML IV SOLN
INTRAVENOUS | Status: DC | PRN
  Administered 2019-06-26: 50 mg via INTRAVENOUS

## 2019-06-26 MED ORDER — GLUCAGON HCL RDNA (DIAGNOSTIC) 1 MG IJ SOLR
INTRAMUSCULAR | Status: AC
  Filled 2019-06-26: qty 1

## 2019-06-26 MED ORDER — PHENYLEPHRINE 40 MCG/ML (10ML) SYRINGE FOR IV PUSH (FOR BLOOD PRESSURE SUPPORT)
PREFILLED_SYRINGE | INTRAVENOUS | Status: DC | PRN
  Administered 2019-06-26 (×2): 120 ug via INTRAVENOUS

## 2019-06-26 MED ORDER — ONDANSETRON HCL 4 MG/2ML IJ SOLN
INTRAMUSCULAR | Status: DC | PRN
  Administered 2019-06-26: 4 mg via INTRAVENOUS

## 2019-06-26 MED ORDER — LIDOCAINE 2% (20 MG/ML) 5 ML SYRINGE
INTRAMUSCULAR | Status: DC | PRN
  Administered 2019-06-26: 40 mg via INTRAVENOUS

## 2019-06-26 MED ORDER — SODIUM CHLORIDE 0.9 % IV SOLN
3.0000 g | Freq: Three times a day (TID) | INTRAVENOUS | Status: DC
  Administered 2019-06-26 – 2019-06-29 (×9): 3 g via INTRAVENOUS
  Filled 2019-06-26 (×3): qty 8
  Filled 2019-06-26 (×2): qty 3
  Filled 2019-06-26 (×4): qty 8
  Filled 2019-06-26: qty 3
  Filled 2019-06-26 (×2): qty 8
  Filled 2019-06-26: qty 3

## 2019-06-26 MED ORDER — MUPIROCIN 2 % EX OINT
1.0000 "application " | TOPICAL_OINTMENT | Freq: Two times a day (BID) | CUTANEOUS | Status: DC
  Administered 2019-06-26 – 2019-06-29 (×6): 1 via NASAL
  Filled 2019-06-26 (×2): qty 22

## 2019-06-26 MED ORDER — SODIUM CHLORIDE 0.9 % IV SOLN
INTRAVENOUS | Status: DC | PRN
  Administered 2019-06-26: 10:00:00 25 ug/min via INTRAVENOUS

## 2019-06-26 MED ORDER — HALOPERIDOL LACTATE 5 MG/ML IJ SOLN
2.0000 mg | Freq: Once | INTRAMUSCULAR | Status: AC
  Administered 2019-06-26: 2 mg via INTRAVENOUS
  Filled 2019-06-26: qty 1

## 2019-06-26 MED ORDER — SODIUM CHLORIDE 0.9 % IV SOLN
INTRAVENOUS | Status: DC | PRN
  Administered 2019-06-26: 100 mL

## 2019-06-26 MED ORDER — GLUCAGON HCL RDNA (DIAGNOSTIC) 1 MG IJ SOLR
INTRAMUSCULAR | Status: DC | PRN
  Administered 2019-06-26 (×4): 0.25 mg via INTRAVENOUS

## 2019-06-26 MED ORDER — GLUCAGON HCL RDNA (DIAGNOSTIC) 1 MG IJ SOLR
INTRAMUSCULAR | Status: AC
  Filled 2019-06-26: qty 2

## 2019-06-26 MED ORDER — SUGAMMADEX SODIUM 200 MG/2ML IV SOLN
INTRAVENOUS | Status: DC | PRN
  Administered 2019-06-26: 150 mg via INTRAVENOUS

## 2019-06-26 MED ORDER — PROPOFOL 10 MG/ML IV BOLUS
INTRAVENOUS | Status: DC | PRN
  Administered 2019-06-26: 110 mg via INTRAVENOUS

## 2019-06-26 MED ORDER — METOPROLOL TARTRATE 25 MG PO TABS
25.0000 mg | ORAL_TABLET | Freq: Two times a day (BID) | ORAL | Status: DC
  Administered 2019-06-26 – 2019-06-29 (×6): 25 mg via ORAL
  Filled 2019-06-26 (×6): qty 1

## 2019-06-26 NOTE — Progress Notes (Signed)
Pharmacy Antibiotic Note  Ricardo Washington is a 84 y.o. male admitted on 06/23/2019 and found to have E. Coli/Klebsiella bacteremia and cholangitis.  Pharmacy has been consulted to narrow from Zosyn to Unasyn dosing.  Plan: Unasyn 3g IV every 8 hours Monitor renal function, Cx and ID recs  Weight: 165 lb 5.5 oz (75 kg)  Temp (24hrs), Avg:98.1 F (36.7 C), Min:97.7 F (36.5 C), Max:98.5 F (36.9 C)  Recent Labs  Lab 06/23/19 1717 06/24/19 0223 06/25/19 0324 06/26/19 0524  WBC 7.6 17.7* 9.4 8.6  CREATININE 1.17 1.30* 1.40* 1.36*    Estimated Creatinine Clearance: 39.6 mL/min (A) (by C-G formula based on SCr of 1.36 mg/dL (H)).    Allergies  Allergen Reactions  . Sulfonamide Derivatives     Unknown, per pt and "cant stand it"    Bertis Ruddy, PharmD Clinical Pharmacist Please check AMION for all [place] numbers 06/26/2019 8:04 AM

## 2019-06-26 NOTE — Progress Notes (Signed)
Physical Therapy Cancellation Note   06/26/19 1049  PT Visit Information  Last PT Received On 06/26/19  Reason Eval/Treat Not Completed Patient at procedure or test/unavailable. Pt off unit for ERCP with biliary stent placement. PT will continue to follow acutely.    Earney Navy, PTA Acute Rehabilitation Services Pager: [phone] Office: [phone]

## 2019-06-26 NOTE — Interval H&P Note (Signed)
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  The various methods of treatment have been discussed with the patient and family. After consideration of risks, benefits and other options for treatment, the patient has consented to  Procedure(s) with comments: ENDOSCOPIC RETROGRADE CHOLANGIOPANCREATOGRAPHY (ERCP) (N/A) - with stent  BILIARY STENT PLACEMENT (N/A) as a surgical intervention.  The patient's history has been reviewed, patient examined, no change in status, stable for surgery.  I have reviewed the patient's chart and labs.  Questions were answered to the patient's satisfaction.  I had an opportunity to review the patient's CT and MRI/MRCP.  I think it is worth an attempt at ERCP to try and get into the right hepatic system but this will not be easy.  May consider cholangioscopy dependent on distal duct size and possibility of traversing the cholangioscope through the region but this may also be difficult.  If unsuccessful will need discussion as to possible biliary drainage via external PBD.  Further discussions after ERCP attempt.  The risks of an ERCP were discussed at length, including but not limited to the risk of perforation, bleeding, abdominal pain, post-ERCP pancreatitis (while usually mild can be severe and even life threatening).   [employer]

## 2019-06-26 NOTE — Progress Notes (Signed)
[facility]  Date of Admission:  06/23/2019      Total days of antibiotics 4  Zosyn 4           ASSESSMENT: Ricardo Washington is going to endoscopy suite for ERCP today in attempt to stent area that is compressed causing his acute cholangitis and secondary polymicrobial bacteremia. E coli (pan sensitive) and klebsiella pneumoniae in the blood. Also one site with coagulase negative staph species (methicillin sensitive) in 1/4 bottles likely contaminant from phlebotomy. He is improving; now that sensitivities are back will narrow to unasyn IV.   Hopefully GI intervention will help prevent recurrence for him in the future. Likely we can transition him to oral option (Augmentin?) soon given how quickly he improved.   PLAN: 1. Start unasyn  2. Stop zosyn 3. Follow repeated blood cultures  4. No plan for PICC at this time   Principal Problem:   Biliary sepsis Active Problems:   E coli bacteremia   Bacteremia due to Klebsiella pneumoniae   Essential hypertension   Diabetes mellitus, type 2 (HCC)   Elevated LFTs   Hypokalemia   Acute kidney injury ([place])   Dementia (HCC)   Cholangiocarcinoma (HCC)   Colitis   Hypomagnesemia   Hypophosphatemia   Acute cholangitis   Biliary stricture   . [MAR Hold] enoxaparin (LOVENOX) injection  40 mg Subcutaneous Q24H  . [MAR Hold] finasteride  5 mg Oral Daily  . [MAR Hold] insulin aspart  0-5 Units Subcutaneous QHS  . [MAR Hold] insulin aspart  0-9 Units Subcutaneous TID WC  . [MAR Hold] mupirocin ointment  1 application Nasal BID  . [MAR Hold] tamsulosin  0.4 mg Oral Daily    SUBJECTIVE: Planning ERCP later this morning for brushings/stent with GI team.  Afebrile o/n with resolution of leukocytosis. BCx drawn 8/06 no growth thusfar. No abdominal pain   Review of Systems: Review of Systems  All other systems reviewed and are negative.   Allergies  Allergen Reactions  . Sulfonamide Derivatives    Unknown, per pt and "cant stand it"    OBJECTIVE: Vitals:   06/25/19 2343 06/26/19 0330 06/26/19 0841 06/26/19 0941  BP: (!) 146/69 136/71 (!) 148/75 (!) 167/71  Pulse: 62 61 (!) 56 61  Resp: 17 19 15 18   Temp: 97.9 F (36.6 C) 97.7 F (36.5 C) 97.8 F (36.6 C) 97.6 F (36.4 C)  TempSrc: Oral Oral Oral Oral  SpO2: 97% 93% 100% 98%  Weight:       Body mass index is 27.51 kg/m.  Physical Exam Vitals signs reviewed.  Cardiovascular:     Rate and Rhythm: Normal rate.  Pulmonary:     Effort: Pulmonary effort is normal.  Abdominal:     Tenderness: There is no abdominal tenderness.  Skin:    General: Skin is warm and dry.     Capillary Refill: Capillary refill takes less than 2 seconds.  Neurological:     Mental Status: He is alert and oriented to person, place, and time.  Psychiatric:     Comments: Baseline dementia     Lab Results Lab Results  Component Value Date   WBC 8.6 06/26/2019   HGB 11.7 (L) 06/26/2019   HCT 34.4 (L) 06/26/2019   MCV 101.2 (H) 06/26/2019   PLT 135 (L) 06/26/2019    Lab Results  Component Value Date   CREATININE 1.36 (H) 06/26/2019   BUN 10 06/26/2019  NA 137 06/26/2019   K 4.0 06/26/2019   CL 105 06/26/2019   CO2 22 06/26/2019    Lab Results  Component Value Date   ALT 220 (H) 06/26/2019   AST 80 (H) 06/26/2019   ALKPHOS 162 (H) 06/26/2019   BILITOT 0.8 06/26/2019     Microbiology: Recent Results (from the past 240 hour(s))  Blood culture (routine x 2)     Status: Abnormal   Collection Time: 06/23/19  8:58 PM   Specimen: BLOOD LEFT FOREARM  Result Value Ref Range Status   Specimen Description BLOOD LEFT FOREARM  Final   Special Requests   Final    BOTTLES DRAWN AEROBIC AND ANAEROBIC Blood Culture adequate volume   Culture  Setup Time   Final    ANAEROBIC BOTTLE ONLY GRAM NEGATIVE RODS CRITICAL VALUE NOTED.  VALUE IS CONSISTENT WITH PREVIOUSLY REPORTED AND CALLED VALUE. Performed at [facility], [place] [address] [place], [place] [address]    Culture KLEBSIELLA PNEUMONIAE (A)  Final   Report Status 06/26/2019 FINAL  Final   Organism ID, Bacteria KLEBSIELLA PNEUMONIAE  Final      Susceptibility   Klebsiella pneumoniae - MIC*    AMPICILLIN RESISTANT Resistant     CEFAZOLIN <=4 SENSITIVE Sensitive     CEFEPIME <=1 SENSITIVE Sensitive     CEFTAZIDIME <=1 SENSITIVE Sensitive     CEFTRIAXONE <=1 SENSITIVE Sensitive     CIPROFLOXACIN <=0.25 SENSITIVE Sensitive     GENTAMICIN <=1 SENSITIVE Sensitive     IMIPENEM <=0.25 SENSITIVE Sensitive     TRIMETH/SULFA <=20 SENSITIVE Sensitive     AMPICILLIN/SULBACTAM 4 SENSITIVE Sensitive     PIP/TAZO <=4 SENSITIVE Sensitive     Extended ESBL NEGATIVE Sensitive     * KLEBSIELLA PNEUMONIAE  Blood culture (routine x 2)     Status: Abnormal   Collection Time: 06/23/19  9:03 PM   Specimen: BLOOD  Result Value Ref Range Status   Specimen Description BLOOD RIGHT ANTECUBITAL  Final   Special Requests   Final    BOTTLES DRAWN AEROBIC AND ANAEROBIC Blood Culture adequate volume   Culture  Setup Time   Final    GRAM NEGATIVE RODS ANAEROBIC BOTTLE ONLY CRITICAL RESULT CALLED TO, READ BACK BY AND VERIFIED WITH: Reece Packer PharmD 17:10 06/24/19 ([email or web address]) AEROBIC BOTTLE ONLY GRAM POSITIVE COCCI CRITICAL RESULT CALLED TO, READ BACK BY AND VERIFIED WITH: L SEAY PHARMD 06/24/19 2342 JDW    Culture (A)  Final    ESCHERICHIA COLI STAPHYLOCOCCUS SPECIES (COAGULASE NEGATIVE) THE SIGNIFICANCE OF ISOLATING THIS ORGANISM FROM A SINGLE SET OF BLOOD CULTURES WHEN MULTIPLE SETS ARE DRAWN IS UNCERTAIN. PLEASE NOTIFY THE MICROBIOLOGY DEPARTMENT WITHIN ONE WEEK IF SPECIATION AND SENSITIVITIES ARE REQUIRED. Performed at [facility], [place] [address], [place], [place] [address]    Report Status 06/26/2019 FINAL  Final   Organism ID, Bacteria ESCHERICHIA COLI  Final      Susceptibility   Escherichia coli - MIC*    AMPICILLIN 4 SENSITIVE Sensitive     CEFAZOLIN <=4 SENSITIVE Sensitive      CEFEPIME <=1 SENSITIVE Sensitive     CEFTAZIDIME <=1 SENSITIVE Sensitive     CEFTRIAXONE <=1 SENSITIVE Sensitive     CIPROFLOXACIN 1 SENSITIVE Sensitive     GENTAMICIN <=1 SENSITIVE Sensitive     IMIPENEM <=0.25 SENSITIVE Sensitive     TRIMETH/SULFA <=20 SENSITIVE Sensitive     AMPICILLIN/SULBACTAM <=2 SENSITIVE Sensitive     PIP/TAZO <=4 SENSITIVE  Sensitive     Extended ESBL NEGATIVE Sensitive     * ESCHERICHIA COLI  Blood Culture ID Panel (Reflexed)     Status: Abnormal   Collection Time: 06/23/19  9:03 PM  Result Value Ref Range Status   Enterococcus species NOT DETECTED NOT DETECTED Final   Listeria monocytogenes NOT DETECTED NOT DETECTED Final   Staphylococcus species NOT DETECTED NOT DETECTED Final   Staphylococcus aureus (BCID) NOT DETECTED NOT DETECTED Final   Streptococcus species NOT DETECTED NOT DETECTED Final   Streptococcus agalactiae NOT DETECTED NOT DETECTED Final   Streptococcus pneumoniae NOT DETECTED NOT DETECTED Final   Streptococcus pyogenes NOT DETECTED NOT DETECTED Final   Acinetobacter baumannii NOT DETECTED NOT DETECTED Final   Enterobacteriaceae species DETECTED (A) NOT DETECTED Final    Comment: Enterobacteriaceae represent a large family of gram-negative bacteria, not a single organism. CRITICAL RESULT CALLED TO, READ BACK BY AND VERIFIED WITH: Reece Packer PharmD 17:10 06/24/19 ([email or web address])    Enterobacter cloacae complex NOT DETECTED NOT DETECTED Final   Escherichia coli DETECTED (A) NOT DETECTED Final    Comment: CRITICAL RESULT CALLED TO, READ BACK BY AND VERIFIED WITH: Reece Packer PharmD 17:10 06/24/19 ([email or web address])    Klebsiella oxytoca NOT DETECTED NOT DETECTED Final   Klebsiella pneumoniae NOT DETECTED NOT DETECTED Final   Proteus species NOT DETECTED NOT DETECTED Final   Serratia marcescens NOT DETECTED NOT DETECTED Final   Carbapenem resistance NOT DETECTED NOT DETECTED Final   Haemophilus influenzae NOT DETECTED NOT DETECTED Final   Neisseria  meningitidis NOT DETECTED NOT DETECTED Final   Pseudomonas aeruginosa NOT DETECTED NOT DETECTED Final   Candida albicans NOT DETECTED NOT DETECTED Final   Candida glabrata NOT DETECTED NOT DETECTED Final   Candida krusei NOT DETECTED NOT DETECTED Final   Candida parapsilosis NOT DETECTED NOT DETECTED Final   Candida tropicalis NOT DETECTED NOT DETECTED Final    Comment: Performed at [facility], [place] [address] [place], [place] [address]  Blood Culture ID Panel (Reflexed)     Status: Abnormal   Collection Time: 06/23/19  9:03 PM  Result Value Ref Range Status   Enterococcus species NOT DETECTED NOT DETECTED Final   Listeria monocytogenes NOT DETECTED NOT DETECTED Final   Staphylococcus species DETECTED (A) NOT DETECTED Final    Comment: Methicillin (oxacillin) susceptible coagulase negative staphylococcus. Possible blood culture contaminant (unless isolated from more than one blood culture draw or clinical case suggests pathogenicity). No antibiotic treatment is indicated for blood  culture contaminants. CRITICAL RESULT CALLED TO, READ BACK BY AND VERIFIED WITH: L SEAY PHARMD 06/24/19 2342 JDW    Staphylococcus aureus (BCID) NOT DETECTED NOT DETECTED Final   Methicillin resistance NOT DETECTED NOT DETECTED Final   Streptococcus species NOT DETECTED NOT DETECTED Final   Streptococcus agalactiae NOT DETECTED NOT DETECTED Final   Streptococcus pneumoniae NOT DETECTED NOT DETECTED Final   Streptococcus pyogenes NOT DETECTED NOT DETECTED Final   Acinetobacter baumannii NOT DETECTED NOT DETECTED Final   Enterobacteriaceae species NOT DETECTED NOT DETECTED Final   Enterobacter cloacae complex NOT DETECTED NOT DETECTED Final   Escherichia coli NOT DETECTED NOT DETECTED Final   Klebsiella oxytoca NOT DETECTED NOT DETECTED Final   Klebsiella pneumoniae NOT DETECTED NOT DETECTED Final   Proteus species NOT DETECTED NOT DETECTED Final   Serratia marcescens NOT DETECTED NOT DETECTED Final    Haemophilus influenzae NOT DETECTED NOT DETECTED Final   Neisseria meningitidis NOT DETECTED NOT DETECTED Final   Pseudomonas aeruginosa  NOT DETECTED NOT DETECTED Final   Candida albicans NOT DETECTED NOT DETECTED Final   Candida glabrata NOT DETECTED NOT DETECTED Final   Candida krusei NOT DETECTED NOT DETECTED Final   Candida parapsilosis NOT DETECTED NOT DETECTED Final   Candida tropicalis NOT DETECTED NOT DETECTED Final    Comment: Performed at [facility], [place] [address] [place], [place] [address]  SARS Coronavirus 2 [facility] order, Performed in [facility] hospital lab) Nasopharyngeal Nasopharyngeal Swab     Status: None   Collection Time: 06/23/19  9:56 PM   Specimen: Nasopharyngeal Swab  Result Value Ref Range Status   SARS Coronavirus 2 NEGATIVE NEGATIVE Final    Comment: (NOTE) If result is NEGATIVE SARS-CoV-2 target nucleic acids are NOT DETECTED. The SARS-CoV-2 RNA is generally detectable in upper and lower  respiratory specimens during the acute phase of infection. The lowest  concentration of SARS-CoV-2 viral copies this assay can detect is 250  copies / mL. A negative result does not preclude SARS-CoV-2 infection  and should not be used as the sole basis for treatment or other  patient management decisions.  A negative result may occur with  improper specimen collection / handling, submission of specimen other  than nasopharyngeal swab, presence of viral mutation(s) within the  areas targeted by this assay, and inadequate number of viral copies  (<250 copies / mL). A negative result must be combined with clinical  observations, patient history, and epidemiological information. If result is POSITIVE SARS-CoV-2 target nucleic acids are DETECTED. The SARS-CoV-2 RNA is generally detectable in upper and lower  respiratory specimens dur ing the acute phase of infection.  Positive  results are indicative of active infection with SARS-CoV-2.  Clinical  correlation  with patient history and other diagnostic information is  necessary to determine patient infection status.  Positive results do  not rule out bacterial infection or co-infection with other viruses. If result is PRESUMPTIVE POSTIVE SARS-CoV-2 nucleic acids MAY BE PRESENT.   A presumptive positive result was obtained on the submitted specimen  and confirmed on repeat testing.  While 2019 novel coronavirus  (SARS-CoV-2) nucleic acids may be present in the submitted sample  additional confirmatory testing may be necessary for epidemiological  and / or clinical management purposes  to differentiate between  SARS-CoV-2 and other Sarbecovirus currently known to infect humans.  If clinically indicated additional testing with an alternate test  methodology [phone]) is advised. The SARS-CoV-2 RNA is generally  detectable in upper and lower respiratory sp ecimens during the acute  phase of infection. The expected result is Negative. Fact Sheet for Patients:  StrictlyIdeas.no Fact Sheet for Healthcare Providers: BankingDealers.co.za This test is not yet approved or cleared by the [place] FDA and has been authorized for detection and/or diagnosis of SARS-CoV-2 by FDA under an Emergency Use Authorization (EUA).  This EUA will remain in effect (meaning this test can be used) for the duration of the COVID-19 declaration under Section 564(b)(1) of the Act, 21 U.S.C. section 360bbb-3(b)(1), unless the authorization is terminated or revoked sooner. Performed at [facility], [place] [address], [place], [place] [address]   Culture, blood (routine x 2)     Status: None (Preliminary result)   Collection Time: 06/25/19  2:30 PM   Specimen: BLOOD  Result Value Ref Range Status   Specimen Description BLOOD LEFT ANTECUBITAL  Final   Special Requests   Final    BOTTLES DRAWN AEROBIC AND ANAEROBIC Blood Culture adequate volume  Culture   Final    NO  GROWTH < 24 HOURS Performed at [facility], [place] [address], [place], [place] [address]    Report Status PENDING  Incomplete  Culture, blood (routine x 2)     Status: None (Preliminary result)   Collection Time: 06/25/19  3:00 PM   Specimen: BLOOD LEFT ARM  Result Value Ref Range Status   Specimen Description BLOOD LEFT ARM  Final   Special Requests   Final    BOTTLES DRAWN AEROBIC AND ANAEROBIC Blood Culture adequate volume   Culture   Final    NO GROWTH < 24 HOURS Performed at [facility], [place] [address], [place], [place] [address]    Report Status PENDING  Incomplete  Surgical PCR screen     Status: None   Collection Time: 06/26/19  6:34 AM   Specimen: Nasal Mucosa; Nasal Swab  Result Value Ref Range Status   MRSA, PCR NEGATIVE NEGATIVE Final   Staphylococcus aureus NEGATIVE NEGATIVE Final    Comment: (NOTE) The Xpert SA Assay (FDA approved for NASAL specimens in patients 25 years of age and older), is one component of a comprehensive surveillance program. It is not intended to diagnose infection nor to guide or monitor treatment. Performed at [facility], [place] [address], [place], [place] [address]      Janene Madeira, MSN, NP-C [facility] [email or web address] Pager: [phone] Office: [phone] [place]: [phone]

## 2019-06-26 NOTE — Transfer of Care (Signed)
Immediate Anesthesia Transfer of Care Note  Patient: Ricardo Washington  Procedure(s) Performed: ENDOSCOPIC RETROGRADE CHOLANGIOPANCREATOGRAPHY (ERCP) (N/A ) SPHINCTEROTOMY REMOVAL OF STONES BALLOON DILATION (N/A ) BILIARY STENT PLACEMENT  Patient Location: Endoscopy Unit  Anesthesia Type:General  Level of Consciousness: awake, alert  and oriented  Airway & Oxygen Therapy: Patient Spontanous Breathing and Patient connected to face mask oxygen  Post-op Assessment: Report given to RN and Post -op Vital signs reviewed and stable  Post vital signs: Reviewed and stable  Last Vitals:  Vitals Value Taken Time  BP 159/80 06/26/19 1218  Temp    Pulse 65 06/26/19 1223  Resp 21 06/26/19 1223  SpO2 99 % 06/26/19 1223  Vitals shown include unvalidated device data.  Last Pain:  Vitals:   06/26/19 0941  TempSrc: Oral  PainSc: 0-No pain         Complications: No apparent anesthesia complications

## 2019-06-26 NOTE — Op Note (Addendum)
[facility] Patient Name: Ricardo Washington Procedure Date : 06/26/2019 MRN: [id number] Attending MD: Justice Britain , MD Date of Birth: 03-18-1935 CSN: [id number] Age: 84 Admit Type: Inpatient Procedure:                ERCP Indications:              Abnormal MRCP, Suspected ascending cholangitis, For                            therapy of ascending cholangitis, Abnormal liver                            function test Providers:                Justice Britain, MD, Carlyn Reichert, RN, Ashley Jacobs, RN, Elspeth Cho Tech., Technician,                            Carver Fila Referring MD:             Gerrit Heck, MD, [facility], Jackquline Denmark, MD Medicines:                General Anesthesia, Zosyn given during procedure,                            Indomethacin 643 mg PR Complications:            No immediate complications. Estimated Blood Loss:     Estimated blood loss was minimal. Procedure:                Pre-Anesthesia Assessment:                           - Prior to the procedure, a History and Physical                            was performed, and patient medications and                            allergies were reviewed. The patient's tolerance of                            previous anesthesia was also reviewed. The risks                            and benefits of the procedure and the sedation                            options and risks were discussed with the patient.                            All questions were answered, and  informed consent                            was obtained. Prior Anticoagulants: The patient has                            taken Lovenox (enoxaparin), last dose was 1 day                            prior to procedure. ASA Grade Assessment: III - A                            patient with severe systemic disease. After                            reviewing the risks and  benefits, the patient was                            deemed in satisfactory condition to undergo the                            procedure.                           After obtaining informed consent, the scope was                            passed under direct vision. Throughout the                            procedure, the patient's blood pressure, pulse, and                            oxygen saturations were monitored continuously. The                            TJF-Q180V ([id number] Olympus duodenoscope was                            introduced through the mouth, and used to inject                            contrast into and used to inject contrast into the                            bile duct. The ERCP was technically difficult and                            complex. Successful completion of the procedure was                            aided by changing the patient to a prone position.  The patient tolerated the procedure. Scope In: Scope Out: Findings:      The scout film was normal.      The upper GI tract was traversed under direct vision without detailed       examination. A J-shaped deformity was found in the entire examined       stomach. A medium diverticulum was found in the D1/D2 angle of the       duodenum. The major papilla was normal.      A short 0.035 inch Soft Jagwire was passed into the biliary tree. The       Autotome sphincterotome was passed over the guidewire and the bile duct       was then deeply cannulated. Contrast was injected. I personally       interpreted the bile duct images. Ductal flow of contrast was adequate.       Image quality was adequate. Contrast extended to the hepatic ducts.       Opacification of the entire biliary tree except for the cystic duct and       gallbladder was successful. The maximum diameter of the ducts was 10 mm.       The lower third of the main duct contained filling defects thought to be       sludge.  The right main hepatic duct contained a single mild narrowing       less than 5 mm in length. The right main hepatic duct was moderately       dilated. The largest diameter was 8 mm. A 7 mm biliary sphincterotomy       was made with a monofilament Autotome sphincterotome using ERBE       electrocautery. There was no post-sphincterotomy bleeding. We lost       position on 7 different occasions when in the short position. We had to       use the semi-long position to maintain stability throughout the rest of       the procedure. To discover objects, the biliary tree was swept with a       retrieval balloon starting at the bifurcation, left main hepatic duct       and right main hepatic duct. Clots were swept from the duct. Sludge was       swept from the duct. Multiple black-pigmented stones were removed. No       stones remained. Cells for cytology were obtained by brushing in the       right main hepatic duct at the narrowing previously mentioned. The       entire biliary tree was biopsied with a suction technique for histology.       The biliary pancreatic junction and the lower third of the main bile       duct were successfully dilated with an 06-27-09 mm balloon (to a maximum       balloon size of 10 mm) dilator as a sphincteroplasty for 4-minutes. To       discover objects, the biliary tree was swept with a retrieval balloon       starting at the right main hepatic duct. Debris was swept from the duct.       Clots were swept from the duct. Sludge was swept from the duct. An       occlusion cholangiogram was performed that showed no further significant       biliary pathology and actually showed an improvement in the previously  noted narrowing region. I did have concern whether the region I sampled       really is what was noted on the MRI/MRCP but it could be higher than I       could get a wire into. One 10 Fr by 12 cm transpapillary plastic biliary       stent with a single  external flap and a single internal flap was placed       into the right hepatic duct. The stent was in good position.      A pancreatogram was not performed.      The duodenoscope was withdrawn from the patient. Impression:               - J-shaped deformity in the entire stomach.                           - Duodenal diverticulum.                           - The major papilla appeared normal.                           - The fluoroscopic examination was suspicious for                            sludge.                           - A single mild biliary narrowing was found in the                            right main hepatic duct region. The stricture was                            indeterminate. This was brushed.                           - The right main hepatic duct was moderately                            dilated.                           - Choledocholithiasis, hemobilia, biliary sludge                            was found. Complete removal was accomplished by                            biliary sphincterotomy and balloon sphincteroplasty.                           - Tissue from within the biliary tree was also                            suctioned and placed into 2 separate jars for  histology purposes.                           - One plastic biliary stent was placed into the                            right hepatic duct in order to attempt to traverse                            the biliary narrowing. Recommendation:           - The patient will be observed post-procedure,                            until all discharge criteria are met.                           - Return patient to hospital ward for ongoing care.                           - Advance diet as tolerated.                           - Watch for pancreatitis, bleeding, perforation,                            and cholangitis.                           - Check hemoglobin q 12 hours for one day.                            - Check liver enzymes (AST, ALT, alkaline                            phosphatase, bilirubin) in the morning.                           - Continue antibiotics as per medical service for                            treatment of GNR bacteremia.                           - Follow up pathology.                           - Will need repeat ERCP in time to be determined                            based on pathology and LFT pattern. May consider                            ERCP with Spyglass for next evaluation if  unremarkable pathology/histology.                           - Hold on restarting VTE PPx for next 72 hours to                            decrease post-ERCP bleeding effects.                           - If significant bleeding occurs, please alert the                            GI service to evaluate whether this is a result of                            hemobilia. If hemodynamically significant anemia                            develops, then may need IR for angiography.                           - The findings and recommendations were discussed                            with the patient.                           - The findings and recommendations were discussed                            with the patient's family. Procedure Code(s):        --- Professional ---                           [phone], Endoscopic retrograde                            cholangiopancreatography (ERCP); with placement of                            endoscopic stent into biliary or pancreatic duct,                            including pre- and post-dilation and guide wire                            passage, when performed, including sphincterotomy,                            when performed, each stent                           [id number], Endoscopic retrograde                            cholangiopancreatography (ERCP); with removal of  calculi/debris  from biliary/pancreatic duct(s) Diagnosis Code(s):        --- Professional ---                           K31.89, Other diseases of stomach and duodenum                           K80.31, Calculus of bile duct with cholangitis,                            unspecified, with obstruction                           K83.09, Other cholangitis                           R94.5, Abnormal results of liver function studies                           K57.10, Diverticulosis of small intestine without                            perforation or abscess without bleeding                           K83.8, Other specified diseases of biliary tract                           R93.2, Abnormal findings on diagnostic imaging of                            liver and biliary tract CPT copyright 2019 [employer]. All rights reserved. The codes documented in this report are preliminary and upon coder review may  be revised to meet current compliance requirements. Justice Britain, MD 06/26/2019 12:57:04 PM Number of Addenda: 0

## 2019-06-26 NOTE — Progress Notes (Signed)
[facility] PROGRESS NOTE  Ricardo Washington GBT:[id number] DOB: March 01, 1935 DOA: 06/23/2019 PCP: [name], MD  Assessment/Plan: #1.  Biliary sepsis/E. Coli/Klebsiella bacteremia/cholangitis.  Patient remains afebrile hemodynamically stable no leukocytosis.  LFTs continue to trend downward.  Acute hepatitis panel negative.  Evaluated by GI.  Scheduled for ERCP today.  Evaluated by infectious disease who recommend changing Zosyn to Unasyn.  Patient has been pain-free for the last 2 days and tolerating p.o.'s. -ERCP today -Unasyn per infectious disease -Gentle IV fluids -Supportive therapy  #2.  Hypomagnesemia/hypophosphatemia -Repleted -Resolved  #3.  Acute kidney injury.  At noon 1.36.  Chart review indicates creatinine 1.04 last month -Hold nephrotoxins -Gentle IV fluids -Monitor urine output -Recheck in the a.m.  #4.  Dementia.  Stable at baseline.  Provide information and give instructions to wife.  #5.  Hypertension.  Initially blood pressure on the low end of normal.  Trending up the last 24 hours.  Home medications include lisinopril and metoprolol.  These have been on hold. -Consider resuming metoprolol depending on postprocedure blood pressure -Continue to hold lisinopril due to #3 -Monitor closely  #6.  Diabetes.  Home medications include Glucophage.  Serum glucose 133 this morning. hemoglobin A1c 6.1 last month. -Holding oral agents for now -Sliding scale insulin for optimal control   Code Status: full Family Communication: wife Disposition Plan: home when ready   Consultants:  Lucianne Lei dam ID  [facility]  Procedures:  Ercp 8/7  MRCP 8/6  Antibiotics:  Zosyn 8/4-8/7  unasyn 8/7>>>  HPI/Subjective: Awake alert. Denies pain/discomfort  Objective: Vitals:   06/26/19 0330 06/26/19 0841  BP: 136/71 (!) 148/75  Pulse: 61 (!) 56  Resp: 19 15  Temp: 97.7 F (36.5 C) 97.8 F (36.6 C)  SpO2: 93% 100%    Intake/Output Summary (Last 24 hours)  at 06/26/2019 0936 Last data filed at 06/26/2019 0330 Gross per 24 hour  Intake -  Output 800 ml  Net -800 ml   Filed Weights   06/24/19 0045 06/24/19 0141  Weight: 77.4 kg 75 kg    Exam:   General:  Awake alert no acute distress  Cardiovascular: rrr no mgr no LE edema  Respiratory: normal effort BS clear bilaterally no wheeze  Abdomen: slightly distended but soft mild diffuse tenderness to palpation no guarding or rebounding  Musculoskeletal: joints without swelling/erythema   Data Reviewed: Basic Metabolic Panel: Recent Labs  Lab 06/23/19 1717 06/24/19 0223 06/25/19 0324 06/26/19 0524  NA 139 140 137 137  K 3.7 3.4* 4.0 4.0  CL 107 106 106 105  CO2 19* 22 21* 22  GLUCOSE 162* 122* 93 133*  BUN 15 15 12 10   CREATININE 1.17 1.30* 1.40* 1.36*  CALCIUM 8.5* 7.9* 7.6* 7.9*  MG  --  1.2* 1.9  --   PHOS  --  2.4* 3.2  --    Liver Function Tests: Recent Labs  Lab 06/23/19 1717 06/24/19 0223 06/25/19 0324 06/26/19 0524  AST 1,061* 1,075* 261* 80*  ALT 463* 675* 352* 220*  ALKPHOS 256* 255* 186* 162*  BILITOT 2.0* 2.5* 1.0 0.8  PROT 6.1* 5.6* 5.1* 5.1*  ALBUMIN 3.4* 2.9* 2.6* 2.5*   Recent Labs  Lab 06/23/19 1717  LIPASE 22   No results for input(s): AMMONIA in the last 168 hours. CBC: Recent Labs  Lab 06/23/19 1717 06/24/19 0223 06/25/19 0324 06/26/19 0524  WBC 7.6 17.7* 9.4 8.6  NEUTROABS  --   --  7.8* 6.9  HGB 14.4 12.5*  11.4* 11.7*  HCT 42.9 37.3* 33.8* 34.4*  MCV 103.6* 102.2* 100.0 101.2*  PLT 134* 146* 130* 135*   Cardiac Enzymes: No results for input(s): CKTOTAL, CKMB, CKMBINDEX, TROPONINI in the last 168 hours. BNP (last 3 results) No results for input(s): BNP in the last 8760 hours.  ProBNP (last 3 results) No results for input(s): PROBNP in the last 8760 hours.  CBG: Recent Labs  Lab 06/25/19 1126 06/25/19 1635 06/25/19 2151 06/26/19 0559 06/26/19 0739  GLUCAP 81 98 94 125* 122*    Recent Results (from the past 240  hour(s))  Blood culture (routine x 2)     Status: Abnormal   Collection Time: 06/23/19  8:58 PM   Specimen: BLOOD LEFT FOREARM  Result Value Ref Range Status   Specimen Description BLOOD LEFT FOREARM  Final   Special Requests   Final    BOTTLES DRAWN AEROBIC AND ANAEROBIC Blood Culture adequate volume   Culture  Setup Time   Final    ANAEROBIC BOTTLE ONLY GRAM NEGATIVE RODS CRITICAL VALUE NOTED.  VALUE IS CONSISTENT WITH PREVIOUSLY REPORTED AND CALLED VALUE. Performed at [facility], [place] [address] [place], [place] [address]    Culture KLEBSIELLA PNEUMONIAE (A)  Final   Report Status 06/26/2019 FINAL  Final   Organism ID, Bacteria KLEBSIELLA PNEUMONIAE  Final      Susceptibility   Klebsiella pneumoniae - MIC*    AMPICILLIN RESISTANT Resistant     CEFAZOLIN <=4 SENSITIVE Sensitive     CEFEPIME <=1 SENSITIVE Sensitive     CEFTAZIDIME <=1 SENSITIVE Sensitive     CEFTRIAXONE <=1 SENSITIVE Sensitive     CIPROFLOXACIN <=0.25 SENSITIVE Sensitive     GENTAMICIN <=1 SENSITIVE Sensitive     IMIPENEM <=0.25 SENSITIVE Sensitive     TRIMETH/SULFA <=20 SENSITIVE Sensitive     AMPICILLIN/SULBACTAM 4 SENSITIVE Sensitive     PIP/TAZO <=4 SENSITIVE Sensitive     Extended ESBL NEGATIVE Sensitive     * KLEBSIELLA PNEUMONIAE  Blood culture (routine x 2)     Status: Abnormal   Collection Time: 06/23/19  9:03 PM   Specimen: BLOOD  Result Value Ref Range Status   Specimen Description BLOOD RIGHT ANTECUBITAL  Final   Special Requests   Final    BOTTLES DRAWN AEROBIC AND ANAEROBIC Blood Culture adequate volume   Culture  Setup Time   Final    GRAM NEGATIVE RODS ANAEROBIC BOTTLE ONLY CRITICAL RESULT CALLED TO, READ BACK BY AND VERIFIED WITH: Reece Packer PharmD 17:10 06/24/19 ([email or web address]) AEROBIC BOTTLE ONLY GRAM POSITIVE COCCI CRITICAL RESULT CALLED TO, READ BACK BY AND VERIFIED WITH: L SEAY PHARMD 06/24/19 2342 JDW    Culture (A)  Final    ESCHERICHIA COLI STAPHYLOCOCCUS SPECIES (COAGULASE  NEGATIVE) THE SIGNIFICANCE OF ISOLATING THIS ORGANISM FROM A SINGLE SET OF BLOOD CULTURES WHEN MULTIPLE SETS ARE DRAWN IS UNCERTAIN. PLEASE NOTIFY THE MICROBIOLOGY DEPARTMENT WITHIN ONE WEEK IF SPECIATION AND SENSITIVITIES ARE REQUIRED. Performed at [facility], [place] [address], [place], [place] [address]    Report Status 06/26/2019 FINAL  Final   Organism ID, Bacteria ESCHERICHIA COLI  Final      Susceptibility   Escherichia coli - MIC*    AMPICILLIN 4 SENSITIVE Sensitive     CEFAZOLIN <=4 SENSITIVE Sensitive     CEFEPIME <=1 SENSITIVE Sensitive     CEFTAZIDIME <=1 SENSITIVE Sensitive     CEFTRIAXONE <=1 SENSITIVE Sensitive     CIPROFLOXACIN 1 SENSITIVE Sensitive  GENTAMICIN <=1 SENSITIVE Sensitive     IMIPENEM <=0.25 SENSITIVE Sensitive     TRIMETH/SULFA <=20 SENSITIVE Sensitive     AMPICILLIN/SULBACTAM <=2 SENSITIVE Sensitive     PIP/TAZO <=4 SENSITIVE Sensitive     Extended ESBL NEGATIVE Sensitive     * ESCHERICHIA COLI  Blood Culture ID Panel (Reflexed)     Status: Abnormal   Collection Time: 06/23/19  9:03 PM  Result Value Ref Range Status   Enterococcus species NOT DETECTED NOT DETECTED Final   Listeria monocytogenes NOT DETECTED NOT DETECTED Final   Staphylococcus species NOT DETECTED NOT DETECTED Final   Staphylococcus aureus (BCID) NOT DETECTED NOT DETECTED Final   Streptococcus species NOT DETECTED NOT DETECTED Final   Streptococcus agalactiae NOT DETECTED NOT DETECTED Final   Streptococcus pneumoniae NOT DETECTED NOT DETECTED Final   Streptococcus pyogenes NOT DETECTED NOT DETECTED Final   Acinetobacter baumannii NOT DETECTED NOT DETECTED Final   Enterobacteriaceae species DETECTED (A) NOT DETECTED Final    Comment: Enterobacteriaceae represent a large family of gram-negative bacteria, not a single organism. CRITICAL RESULT CALLED TO, READ BACK BY AND VERIFIED WITH: Reece Packer PharmD 17:10 06/24/19 ([email or web address])    Enterobacter cloacae complex NOT DETECTED NOT  DETECTED Final   Escherichia coli DETECTED (A) NOT DETECTED Final    Comment: CRITICAL RESULT CALLED TO, READ BACK BY AND VERIFIED WITH: Reece Packer PharmD 17:10 06/24/19 ([email or web address])    Klebsiella oxytoca NOT DETECTED NOT DETECTED Final   Klebsiella pneumoniae NOT DETECTED NOT DETECTED Final   Proteus species NOT DETECTED NOT DETECTED Final   Serratia marcescens NOT DETECTED NOT DETECTED Final   Carbapenem resistance NOT DETECTED NOT DETECTED Final   Haemophilus influenzae NOT DETECTED NOT DETECTED Final   Neisseria meningitidis NOT DETECTED NOT DETECTED Final   Pseudomonas aeruginosa NOT DETECTED NOT DETECTED Final   Candida albicans NOT DETECTED NOT DETECTED Final   Candida glabrata NOT DETECTED NOT DETECTED Final   Candida krusei NOT DETECTED NOT DETECTED Final   Candida parapsilosis NOT DETECTED NOT DETECTED Final   Candida tropicalis NOT DETECTED NOT DETECTED Final    Comment: Performed at [facility], [place] [address], [place], [place] [address]  Blood Culture ID Panel (Reflexed)     Status: Abnormal   Collection Time: 06/23/19  9:03 PM  Result Value Ref Range Status   Enterococcus species NOT DETECTED NOT DETECTED Final   Listeria monocytogenes NOT DETECTED NOT DETECTED Final   Staphylococcus species DETECTED (A) NOT DETECTED Final    Comment: Methicillin (oxacillin) susceptible coagulase negative staphylococcus. Possible blood culture contaminant (unless isolated from more than one blood culture draw or clinical case suggests pathogenicity). No antibiotic treatment is indicated for blood  culture contaminants. CRITICAL RESULT CALLED TO, READ BACK BY AND VERIFIED WITH: L SEAY PHARMD 06/24/19 2342 JDW    Staphylococcus aureus (BCID) NOT DETECTED NOT DETECTED Final   Methicillin resistance NOT DETECTED NOT DETECTED Final   Streptococcus species NOT DETECTED NOT DETECTED Final   Streptococcus agalactiae NOT DETECTED NOT DETECTED Final   Streptococcus pneumoniae NOT DETECTED NOT  DETECTED Final   Streptococcus pyogenes NOT DETECTED NOT DETECTED Final   Acinetobacter baumannii NOT DETECTED NOT DETECTED Final   Enterobacteriaceae species NOT DETECTED NOT DETECTED Final   Enterobacter cloacae complex NOT DETECTED NOT DETECTED Final   Escherichia coli NOT DETECTED NOT DETECTED Final   Klebsiella oxytoca NOT DETECTED NOT DETECTED Final   Klebsiella pneumoniae NOT DETECTED NOT DETECTED Final   Proteus species NOT  DETECTED NOT DETECTED Final   Serratia marcescens NOT DETECTED NOT DETECTED Final   Haemophilus influenzae NOT DETECTED NOT DETECTED Final   Neisseria meningitidis NOT DETECTED NOT DETECTED Final   Pseudomonas aeruginosa NOT DETECTED NOT DETECTED Final   Candida albicans NOT DETECTED NOT DETECTED Final   Candida glabrata NOT DETECTED NOT DETECTED Final   Candida krusei NOT DETECTED NOT DETECTED Final   Candida parapsilosis NOT DETECTED NOT DETECTED Final   Candida tropicalis NOT DETECTED NOT DETECTED Final    Comment: Performed at [facility], [place] [address], [place], [place] [address]  SARS Coronavirus 2 [facility] order, Performed in [facility] hospital lab) Nasopharyngeal Nasopharyngeal Swab     Status: None   Collection Time: 06/23/19  9:56 PM   Specimen: Nasopharyngeal Swab  Result Value Ref Range Status   SARS Coronavirus 2 NEGATIVE NEGATIVE Final    Comment: (NOTE) If result is NEGATIVE SARS-CoV-2 target nucleic acids are NOT DETECTED. The SARS-CoV-2 RNA is generally detectable in upper and lower  respiratory specimens during the acute phase of infection. The lowest  concentration of SARS-CoV-2 viral copies this assay can detect is 250  copies / mL. A negative result does not preclude SARS-CoV-2 infection  and should not be used as the sole basis for treatment or other  patient management decisions.  A negative result may occur with  improper specimen collection / handling, submission of specimen other  than nasopharyngeal swab, presence of  viral mutation(s) within the  areas targeted by this assay, and inadequate number of viral copies  (<250 copies / mL). A negative result must be combined with clinical  observations, patient history, and epidemiological information. If result is POSITIVE SARS-CoV-2 target nucleic acids are DETECTED. The SARS-CoV-2 RNA is generally detectable in upper and lower  respiratory specimens dur ing the acute phase of infection.  Positive  results are indicative of active infection with SARS-CoV-2.  Clinical  correlation with patient history and other diagnostic information is  necessary to determine patient infection status.  Positive results do  not rule out bacterial infection or co-infection with other viruses. If result is PRESUMPTIVE POSTIVE SARS-CoV-2 nucleic acids MAY BE PRESENT.   A presumptive positive result was obtained on the submitted specimen  and confirmed on repeat testing.  While 2019 novel coronavirus  (SARS-CoV-2) nucleic acids may be present in the submitted sample  additional confirmatory testing may be necessary for epidemiological  and / or clinical management purposes  to differentiate between  SARS-CoV-2 and other Sarbecovirus currently known to infect humans.  If clinically indicated additional testing with an alternate test  methodology [id number]) is advised. The SARS-CoV-2 RNA is generally  detectable in upper and lower respiratory sp ecimens during the acute  phase of infection. The expected result is Negative. Fact Sheet for Patients:  StrictlyIdeas.no Fact Sheet for Healthcare Providers: BankingDealers.co.za This test is not yet approved or cleared by the [place] FDA and has been authorized for detection and/or diagnosis of SARS-CoV-2 by FDA under an Emergency Use Authorization (EUA).  This EUA will remain in effect (meaning this test can be used) for the duration of the COVID-19 declaration under Section  564(b)(1) of the Act, 21 U.S.C. section 360bbb-3(b)(1), unless the authorization is terminated or revoked sooner. Performed at [facility], [place] [address], [place], [place] [address]   Surgical PCR screen     Status: None   Collection Time: 06/26/19  6:34 AM   Specimen: Nasal Mucosa; Nasal  Swab  Result Value Ref Range Status   MRSA, PCR NEGATIVE NEGATIVE Final   Staphylococcus aureus NEGATIVE NEGATIVE Final    Comment: (NOTE) The Xpert SA Assay (FDA approved for NASAL specimens in patients 55 years of age and older), is one component of a comprehensive surveillance program. It is not intended to diagnose infection nor to guide or monitor treatment. Performed at [facility], [place] [address], [place], [place] [address]      Studies: Mr 3d Recon At Scanner  Result Date: 06/25/2019 CLINICAL DATA:  Abn liver function tests (LFTs) Abd pain, extrahepatic cholangiocarcinoma suspected12m of gadavist. 84year old male with presumed cholangiocarcinoma, having been monitored by imaging over the last year. Now presenting with abdominal pain, significantly elevated liver enzymes and leukocytosis. No fever. EXAM: MRI ABDOMEN WITHOUT AND WITH CONTRAST (INCLUDING MRCP) TECHNIQUE: Multiplanar multisequence MR imaging of the abdomen was performed both before and after the administration of intravenous contrast. Heavily T2-weighted images of the biliary and pancreatic ducts were obtained, and three-dimensional MRCP images were rendered by post processing. CONTRAST:  Seven mL Gadavist COMPARISON:  CT 06/23/2019, MRI 05/03/2018, PET-CT 05/18/2019 FINDINGS: Lower chest:  Small bilateral pleural effusions. Hepatobiliary: There is mild duct dilatation within the RIGHT hepatic lobe (segment 8/6) which is similar to comparison MRI 05/03/2018 (image 19/1)7. There is minimal contrast enhancement through this region of obstruction. Lesion is better identified on the comparison FDG PET scan. The MRCP sequence  do demonstrate interruption of the ducts in the same segment. The distal common bile duct is normal caliber. No new lesions identified. Pancreas: Normal pancreatic parenchymal intensity. No ductal dilatation or inflammation. Spleen: Normal spleen. Adrenals/urinary tract: Adrenal glands and kidneys are normal. Stomach/Bowel: Stomach and limited of the small bowel is unremarkable Vascular/Lymphatic: Abdominal aortic normal caliber. No retroperitoneal periportal lymphadenopathy. Musculoskeletal: No aggressive osseous lesion IMPRESSION: 1. Persistent duct dilatation in segment 8/6 of the RIGHT hepatic lobe with suspicion of region central obstruction identified by hypermetabolic tissue on comparison FDG PET scan. The MRI findings are very similar to MRI of 05/03/2018. The tumor is very poorly demonstrated on current exam. There is some limitation to the imaging due to patient body motion. 2. Normal distal common bile duct.  Normal pancreas. Electronically Signed   By: SSuzy BouchardM.D.   On: 06/25/2019 07:57   Mr Abdomen Mrcp WMoise BoringContast  Result Date: 06/25/2019 CLINICAL DATA:  Abn liver function tests (LFTs) Abd pain, extrahepatic cholangiocarcinoma suspected723mof gadavist. [age]ear old male with presumed cholangiocarcinoma, having been monitored by imaging over the last year. Now presenting with abdominal pain, significantly elevated liver enzymes and leukocytosis. No fever. EXAM: MRI ABDOMEN WITHOUT AND WITH CONTRAST (INCLUDING MRCP) TECHNIQUE: Multiplanar multisequence MR imaging of the abdomen was performed both before and after the administration of intravenous contrast. Heavily T2-weighted images of the biliary and pancreatic ducts were obtained, and three-dimensional MRCP images were rendered by post processing. CONTRAST:  Seven mL Gadavist COMPARISON:  CT 06/23/2019, MRI 05/03/2018, PET-CT 05/18/2019 FINDINGS: Lower chest:  Small bilateral pleural effusions. Hepatobiliary: There is mild duct dilatation  within the RIGHT hepatic lobe (segment 8/6) which is similar to comparison MRI 05/03/2018 (image 19/1)7. There is minimal contrast enhancement through this region of obstruction. Lesion is better identified on the comparison FDG PET scan. The MRCP sequence do demonstrate interruption of the ducts in the same segment. The distal common bile duct is normal caliber. No new lesions identified. Pancreas: Normal pancreatic parenchymal intensity. No ductal dilatation or inflammation. Spleen:  Normal spleen. Adrenals/urinary tract: Adrenal glands and kidneys are normal. Stomach/Bowel: Stomach and limited of the small bowel is unremarkable Vascular/Lymphatic: Abdominal aortic normal caliber. No retroperitoneal periportal lymphadenopathy. Musculoskeletal: No aggressive osseous lesion IMPRESSION: 1. Persistent duct dilatation in segment 8/6 of the RIGHT hepatic lobe with suspicion of region central obstruction identified by hypermetabolic tissue on comparison FDG PET scan. The MRI findings are very similar to MRI of 05/03/2018. The tumor is very poorly demonstrated on current exam. There is some limitation to the imaging due to patient body motion. 2. Normal distal common bile duct.  Normal pancreas. Electronically Signed   By: Suzy Bouchard M.D.   On: 06/25/2019 07:57    Scheduled Meds: . enoxaparin (LOVENOX) injection  40 mg Subcutaneous Q24H  . finasteride  5 mg Oral Daily  . insulin aspart  0-5 Units Subcutaneous QHS  . insulin aspart  0-9 Units Subcutaneous TID WC  . mupirocin ointment  1 application Nasal BID  . tamsulosin  0.4 mg Oral Daily   Continuous Infusions: . sodium chloride 50 mL/hr at 06/25/19 1235  . ampicillin-sulbactam (UNASYN) IV      Principal Problem:   Biliary sepsis Active Problems:   Acute kidney injury (HCC)   Cholangiocarcinoma (HCC)   Colitis   Acute cholangitis   Biliary stricture   E coli bacteremia   Essential hypertension   Diabetes mellitus, type 2 (HCC)   Elevated  LFTs   Hypokalemia   Dementia (HCC)   Hypomagnesemia   Hypophosphatemia    Time spent: 40 minutes    [place] NP  [facility]  If 7PM-7AM, please contact night-coverage at [email or web address], password [facility] 06/26/2019, 9:36 AM  LOS: 2 days

## 2019-06-26 NOTE — Anesthesia Procedure Notes (Signed)
Procedure Name: Intubation Date/Time: 06/26/2019 10:17 AM Performed by: Alain Marion, CRNA Pre-anesthesia Checklist: Patient identified, Emergency Drugs available, Suction available and Patient being monitored Patient Re-evaluated:Patient Re-evaluated prior to induction Oxygen Delivery Method: Circle System Utilized Preoxygenation: Pre-oxygenation with 100% oxygen Induction Type: IV induction Ventilation: Mask ventilation without difficulty Laryngoscope Size: Miller and 2 Grade View: Grade I Tube type: Oral Tube size: 7.5 mm Number of attempts: 1 Airway Equipment and Method: Stylet and Oral airway Placement Confirmation: ETT inserted through vocal cords under direct vision,  positive ETCO2 and breath sounds checked- equal and bilateral Secured at: 22 cm Tube secured with: Tape Dental Injury: Teeth and Oropharynx as per pre-operative assessment

## 2019-06-26 NOTE — Anesthesia Preprocedure Evaluation (Addendum)
Anesthesia Evaluation  Patient identified by MRN, date of birth, ID band Patient awake    Reviewed: Allergy & Precautions, NPO status , Patient's Chart, lab work & pertinent test results  Airway Mallampati: II  TM Distance: >3 FB Neck ROM: Full    Dental no notable dental hx. (+) Teeth Intact, Dental Advisory Given   Pulmonary former smoker,    Pulmonary exam normal breath sounds clear to auscultation       Cardiovascular hypertension, Pt. on medications and Pt. on home beta blockers + CAD and + CABG  Normal cardiovascular exam Rhythm:Regular Rate:Normal  ECG: NSR, rate 74  Stress Test 2017 Nuclear stress EF: 58%. There was no ST segment deviation noted during stress. Defect 1: There is a small defect of moderate severity present in the basal inferolateral location. Defect 2: There is a small defect of moderate severity present in the apex location. Low risk stress nuclear study with apical thinning and small infarct vs attenuation in the basal inferolateral wall; no ischemia; EF 58 with normal wall motion.   Neuro/Psych PSYCHIATRIC DISORDERS Dementia negative neurological ROS     GI/Hepatic GERD  Medicated,(+) Hepatitis -  Endo/Other  diabetes, Oral Hypoglycemic Agents  Renal/GU Renal disease     Musculoskeletal negative musculoskeletal ROS (+)   Abdominal   Peds  Hematology  (+) anemia , Gout HLD Thrombocytopenia   Anesthesia Other Findings Probable cholangio-CA with biliary obstruction, cholangitis  Reproductive/Obstetrics                           Anesthesia Physical Anesthesia Plan  ASA: IV  Anesthesia Plan: General   Post-op Pain Management:    Induction: Intravenous  PONV Risk Score and Plan: 3 and Ondansetron, Dexamethasone and Treatment may vary due to age or medical condition  Airway Management Planned: Oral ETT  Additional Equipment:   Intra-op Plan:    Post-operative Plan: Extubation in OR  Informed Consent:   Plan Discussed with:   Anesthesia Plan Comments:         Anesthesia Quick Evaluation

## 2019-06-26 NOTE — Progress Notes (Signed)
The nurse obtained verbal consent by the wife Di Kindle [place]) for the procedure through phone. Estill Bamberg the charge nurse was a witness.

## 2019-06-26 NOTE — Anesthesia Postprocedure Evaluation (Signed)
Anesthesia Post Note  Patient: Ricardo Washington  Procedure(s) Performed: ENDOSCOPIC RETROGRADE CHOLANGIOPANCREATOGRAPHY (ERCP) (N/A ) SPHINCTEROTOMY REMOVAL OF STONES BILIARY STENT PLACEMENT BILIARY DILATION BILIARY BRUSHING     Patient location during evaluation: Endoscopy Anesthesia Type: General Level of consciousness: awake and alert Pain management: pain level controlled Vital Signs Assessment: post-procedure vital signs reviewed and stable Respiratory status: spontaneous breathing, nonlabored ventilation, respiratory function stable and patient connected to nasal cannula oxygen Cardiovascular status: blood pressure returned to baseline and stable Postop Assessment: no apparent nausea or vomiting Anesthetic complications: no    Last Vitals:  Vitals:   06/26/19 1240 06/26/19 1327  BP: (!) 153/68 (!) 146/61  Pulse: (!) 57 (!) 56  Resp: 16 17  Temp:  36.5 C  SpO2: 98%     Last Pain:  Vitals:   06/26/19 1327  TempSrc: Oral  PainSc:                  Kristain Hu L Resha Filippone

## 2019-06-27 DIAGNOSIS — R945 Abnormal results of liver function studies: Secondary | ICD-10-CM

## 2019-06-27 LAB — CBC WITH DIFFERENTIAL/PLATELET
Abs Immature Granulocytes: 0.04 10*3/uL (ref 0.00–0.07)
Abs Immature Granulocytes: 0.03 10*3/uL (ref 0.00–0.07)
Abs Immature Granulocytes: 0.03 10*3/uL (ref 0.00–0.07)
Basophils Absolute: 0 10*3/uL (ref 0.0–0.1)
Basophils Absolute: 0 10*3/uL (ref 0.0–0.1)
Basophils Absolute: 0 10*3/uL (ref 0.0–0.1)
Basophils Relative: 0 %
Basophils Relative: 0 %
Basophils Relative: 0 %
Eosinophils Absolute: 0.1 10*3/uL (ref 0.0–0.5)
Eosinophils Absolute: 0.1 10*3/uL (ref 0.0–0.5)
Eosinophils Absolute: 0.2 10*3/uL (ref 0.0–0.5)
Eosinophils Relative: 1 %
Eosinophils Relative: 2 %
Eosinophils Relative: 2 %
HCT: 34.5 % — ABNORMAL LOW (ref 39.0–52.0)
HCT: 34.2 % — ABNORMAL LOW (ref 39.0–52.0)
HCT: 33.4 % — ABNORMAL LOW (ref 39.0–52.0)
Hemoglobin: 11.9 g/dL — ABNORMAL LOW (ref 13.0–17.0)
Hemoglobin: 11.8 g/dL — ABNORMAL LOW (ref 13.0–17.0)
Hemoglobin: 11.6 g/dL — ABNORMAL LOW (ref 13.0–17.0)
Immature Granulocytes: 1 %
Immature Granulocytes: 0 %
Immature Granulocytes: 0 %
Lymphocytes Relative: 13 %
Lymphocytes Relative: 20 %
Lymphocytes Relative: 24 %
Lymphs Abs: 1 10*3/uL (ref 0.7–4.0)
Lymphs Abs: 1.3 10*3/uL (ref 0.7–4.0)
Lymphs Abs: 1.7 10*3/uL (ref 0.7–4.0)
MCH: 34.2 pg — ABNORMAL HIGH (ref 26.0–34.0)
MCH: 34 pg (ref 26.0–34.0)
MCH: 34.1 pg — ABNORMAL HIGH (ref 26.0–34.0)
MCHC: 34.5 g/dL (ref 30.0–36.0)
MCHC: 34.5 g/dL (ref 30.0–36.0)
MCHC: 34.7 g/dL (ref 30.0–36.0)
MCV: 99.1 fL (ref 80.0–100.0)
MCV: 98.6 fL (ref 80.0–100.0)
MCV: 98.2 fL (ref 80.0–100.0)
Monocytes Absolute: 0.6 10*3/uL (ref 0.1–1.0)
Monocytes Absolute: 0.8 10*3/uL (ref 0.1–1.0)
Monocytes Absolute: 0.8 10*3/uL (ref 0.1–1.0)
Monocytes Relative: 8 %
Monocytes Relative: 11 %
Monocytes Relative: 11 %
Neutro Abs: 6.4 10*3/uL (ref 1.7–7.7)
Neutro Abs: 4.5 10*3/uL (ref 1.7–7.7)
Neutro Abs: 4.4 10*3/uL (ref 1.7–7.7)
Neutrophils Relative %: 77 %
Neutrophils Relative %: 67 %
Neutrophils Relative %: 63 %
Platelets: 138 10*3/uL — ABNORMAL LOW (ref 150–400)
Platelets: 144 10*3/uL — ABNORMAL LOW (ref 150–400)
Platelets: 142 10*3/uL — ABNORMAL LOW (ref 150–400)
RBC: 3.48 MIL/uL — ABNORMAL LOW (ref 4.22–5.81)
RBC: 3.47 MIL/uL — ABNORMAL LOW (ref 4.22–5.81)
RBC: 3.4 MIL/uL — ABNORMAL LOW (ref 4.22–5.81)
RDW: 14.3 % (ref 11.5–15.5)
RDW: 14.6 % (ref 11.5–15.5)
RDW: 14.4 % (ref 11.5–15.5)
WBC: 8.2 10*3/uL (ref 4.0–10.5)
WBC: 6.8 10*3/uL (ref 4.0–10.5)
WBC: 7 10*3/uL (ref 4.0–10.5)
nRBC: 0 % (ref 0.0–0.2)
nRBC: 0 % (ref 0.0–0.2)
nRBC: 0 % (ref 0.0–0.2)

## 2019-06-27 LAB — COMPREHENSIVE METABOLIC PANEL
ALT: 156 U/L — ABNORMAL HIGH (ref 0–44)
AST: 39 U/L (ref 15–41)
Albumin: 2.5 g/dL — ABNORMAL LOW (ref 3.5–5.0)
Alkaline Phosphatase: 154 U/L — ABNORMAL HIGH (ref 38–126)
Anion gap: 8 (ref 5–15)
BUN: 9 mg/dL (ref 8–23)
CO2: 22 mmol/L (ref 22–32)
Calcium: 7.7 mg/dL — ABNORMAL LOW (ref 8.9–10.3)
Chloride: 110 mmol/L (ref 98–111)
Creatinine, Ser: 1.09 mg/dL (ref 0.61–1.24)
GFR calc Af Amer: >60 mL/min (ref >60)
GFR calc non Af Amer: >60 mL/min (ref >60)
Glucose, Bld: 99 mg/dL (ref 70–99)
Potassium: 3.9 mmol/L (ref 3.5–5.1)
Sodium: 140 mmol/L (ref 135–145)
Total Bilirubin: 0.7 mg/dL (ref 0.3–1.2)
Total Protein: 5.2 g/dL — ABNORMAL LOW (ref 6.5–8.1)

## 2019-06-27 LAB — GLUCOSE, CAPILLARY
Glucose-Capillary: 90 mg/dL (ref 70–99)
Glucose-Capillary: 86 mg/dL (ref 70–99)
Glucose-Capillary: 147 mg/dL — ABNORMAL HIGH (ref 70–99)
Glucose-Capillary: 110 mg/dL — ABNORMAL HIGH (ref 70–99)
Glucose-Capillary: 103 mg/dL — ABNORMAL HIGH (ref 70–99)

## 2019-06-27 LAB — MAGNESIUM: Magnesium: 1.9 mg/dL (ref 1.7–2.4)

## 2019-06-27 LAB — PHOSPHORUS: Phosphorus: 2.5 mg/dL (ref 2.5–4.6)

## 2019-06-27 NOTE — Progress Notes (Signed)
PROGRESS NOTE    Ricardo Washington  IRS:[id number] DOB: 24-Nov-1934 DOA: 06/23/2019 PCP: [name], MD    Brief Narrative:  HPI per Dr. Marthenia Rolling Patient is an 84 year old Caucasian male who is unable to provide any significant history due to dementing illness.  Patient could not or not tell me why he came to the hospital.  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  Patient is said to have presented with back and abdominal pain with abnormal LFTs.  Bilirubin is only 2, AST is 1061 and ALT is 463 with alkaline phosphatase of 256 (up from 105).  CT Angio of the chest, abdomen and pelvis CT revealed "No evidence of aneurysmal dilatation or dissection within the thoracic and abdominal aorta.   Stable segmental biliary dilatation within segment 8 of the liver stable from the prior exam. Mild decreased attenuation is noted centrally similar to that seen on prior MRI. No significant enlargement is noted.  Wall thickening within the rectosigmoid consistent with focal colitis. No abscess or perforation is noted.  Chronic changes as described above".  According to Dr. Gilford Raid, ER provider, she discussed above findings with the GI doctor, Dr. [name], and Dr. [name] has asked the hospitalist team to admit patient for possible impending sepsis based on prior clinical presentation.  ED Course: On presentation to the ER, patient was afebrile, blood pressure of 84-1 171/90 125 mmHg, heart rate of 77 to 103 bpm and respiratory rate of 18 and O2 sat of 95%.  Patient has been cultured.  IV cefepime have been given.  CT scan is as above.  No leukocytosis.  Pertinent labs: Chemistry reveals sodium of 139, potassium of 3.7, chloride 107, CO2 of 19, BUN of 15, creatinine of 1.17  with a blood sugar of 162.  Alkaline phosphatase is 256, albumin is 3.4, lipase is 22, AST is 1061, ALT is 463, total protein is 6.1, direct bilirubin is 1.4, total bilirubin is 2.  CBC reveals WBC of 7.6, hemoglobin 14.4, hematocrit of 40.8, MCV of 103.6, platelet count of 135.  Assessment & Plan:   Principal Problem:   Biliary sepsis Active Problems:   Acute cholangitis   Essential hypertension   Diabetes mellitus, type 2 (HCC)   Elevated LFTs   Hypokalemia   Acute kidney injury ([facility])   Dementia (HCC)   Cholangiocarcinoma (HCC)   Colitis   Hypomagnesemia   Hypophosphatemia   Biliary stricture   E coli bacteremia   Bacteremia due to Klebsiella pneumoniae  1. Biliary sepsis/E. coli and Klebsiella bacteremia/cholangitis Patient currently afebrile, borderline blood pressure, with no tachycardia or tachypnea. Patient with leukocytosis which is trending back down. Blood cultures positive for E. coli and Klebsiella. Blood cultures with also staph aureus likely contaminant. Transaminitis trending down. Acute hepatitis panel negative. Patient had MRCP evening 06/24/2019. Patient being followed by GI and underwent ERCP on 06/26/2019 which showed indeterminate stricture in the right hepatic duct with brushings taken, choledocholithiasis, biliary sludge, hemobilia which was removed with balloon sweep following biliary sphincterotomy and balloon sphincterotome plasty.  Tissues samples obtained from main biliary tree currently pending.  10 French 12 cm plastic stent placed to the right hepatic duct.  J-shaped gastric deformity noted, duodenal diverticulum.  LFTs trending down.  Continue empiric IV Unasyn.  Repeat blood cultures with  no growth to date.  GI and ID following and appreciate input and recommendations.   2.Electrolyte abnormality/hypomagnesemia/hypophosphatemia Repleted.  3. Dementia Stable.  4. Hypertension Blood pressure improved.  Continue home regimen metoprolol, Proscar,  Flomax.  Follow.  5.  Acute kidney injury Improving with hydration.  Continue to hold nephrotoxins.  Follow.  6.  Diabetes mellitus type 2 Hemoglobin A1c was 6.1 in July 2020.  CBG of 86 this morning.  Continue to hold oral hypoglycemic agents.  Sliding scale insulin.   DVT prophylaxis: SCDs Code Status: Full Family Communication: Updated patient, wife, son at bedside. Disposition Plan: Home once cleared by ID and gastroenterology.   Consultants:   Gastroenterology: Dr. Hilarie Fredrickson 06/24/2019  Infectious disease: Dr. Tommy Medal 06/25/2019  Procedures:   CT angiogram chest abdomen and pelvis 06/23/2019  ERCP with stent placement per Dr. Rush Landmark 06/26/2019  MRCP 06/24/2019  Antimicrobials:  IV Unasyn  06/26/2019  IV Zosyn 06/23/2019>>>> 06/26/2019       Subjective: Patient sitting up in chair.  Family at bedside.  Patient denies any chest pain.  No shortness of breath.  No abdominal pain.  Denies any bleeding.  Feels well.  Tolerating oral intake.  Objective: Vitals:   06/26/19 2333 06/27/19 0506 06/27/19 0835 06/27/19 1148  BP: (!) 145/67 127/61 136/62 (!) 112/58  Pulse: 69 (!) 58 (!) 58 (!) 54  Resp: 18 18 18 18   Temp: 98.2 F (36.8 C) (!) 97.5 F (36.4 C) 98.2 F (36.8 C) 98.1 F (36.7 C)  TempSrc: Oral Oral Oral Oral  SpO2: 96% 97% 98% 97%  Weight:        Intake/Output Summary (Last 24 hours) at 06/27/2019 1152 Last data filed at 06/27/2019 0835 Gross per 24 hour  Intake 913.56 ml  Output 1075 ml  Net -161.44 ml   Filed Weights   06/24/19 0045 06/24/19 0141  Weight: 77.4 kg 75 kg    Examination:  General exam: Appears calm and comfortable  Respiratory system: Clear to auscultation. Respiratory effort normal. Cardiovascular system: S1 & S2 heard, RRR. No JVD, murmurs, rubs, gallops or clicks. No pedal edema. Gastrointestinal system: Abdomen is nondistended, soft and nontender. No organomegaly or masses felt. Normal bowel sounds heard. Central nervous system: Alert  and oriented. No focal neurological deficits. Extremities: Symmetric 5 x 5 power. Skin: No rashes, lesions or ulcers Psychiatry: Judgement and insight appear normal. Mood & affect appropriate.     Data Reviewed: I have personally reviewed following labs and imaging studies  CBC: Recent Labs  Lab 06/24/19 0223 06/25/19 0324 06/26/19 0524 06/26/19 2058 06/27/19 0502  WBC 17.7* 9.4 8.6 8.9 8.2  NEUTROABS  --  7.8* 6.9 6.7 6.4  HGB 12.5* 11.4* 11.7* 12.1* 11.9*  HCT 37.3* 33.8* 34.4* 35.5* 34.5*  MCV 102.2* 100.0 101.2* 100.6* 99.1  PLT 146* 130* 135* 144* 086*   Basic Metabolic Panel: Recent Labs  Lab 06/23/19 1717 06/24/19 0223 06/25/19 0324 06/26/19 0524 06/27/19 0502  NA 139 140 137 137 140  K 3.7 3.4* 4.0 4.0 3.9  CL 107 106 106 105 110  CO2 19* 22 21* 22 22  GLUCOSE 162* 122* 93 133* 99  BUN 15 15 12 10 9   CREATININE 1.17 1.30* 1.40* 1.36* 1.09  CALCIUM 8.5* 7.9* 7.6* 7.9* 7.7*  MG  --  1.2* 1.9  --  1.9  PHOS  --  2.4* 3.2  --  2.5   GFR: Estimated Creatinine Clearance: 49.4 mL/min (by C-G formula based on SCr of  1.09 mg/dL). Liver Function Tests: Recent Labs  Lab 06/23/19 1717 06/24/19 0223 06/25/19 0324 06/26/19 0524 06/27/19 0502  AST 1,061* 1,075* 261* 80* 39  ALT 463* 675* 352* 220* 156*  ALKPHOS 256* 255* 186* 162* 154*  BILITOT 2.0* 2.5* 1.0 0.8 0.7  PROT 6.1* 5.6* 5.1* 5.1* 5.2*  ALBUMIN 3.4* 2.9* 2.6* 2.5* 2.5*   Recent Labs  Lab 06/23/19 1717  LIPASE 22   No results for input(s): AMMONIA in the last 168 hours. Coagulation Profile: Recent Labs  Lab 06/24/19 0223 06/26/19 0524  INR 1.5* 1.2   Cardiac Enzymes: No results for input(s): CKTOTAL, CKMB, CKMBINDEX, TROPONINI in the last 168 hours. BNP (last 3 results) No results for input(s): PROBNP in the last 8760 hours. HbA1C: No results for input(s): HGBA1C in the last 72 hours. CBG: Recent Labs  Lab 06/26/19 1641 06/26/19 2218 06/27/19 0601 06/27/19 0722 06/27/19 1144    GLUCAP 103* 83 90 86 147*   Lipid Profile: No results for input(s): CHOL, HDL, LDLCALC, TRIG, CHOLHDL, LDLDIRECT in the last 72 hours. Thyroid Function Tests: No results for input(s): TSH, T4TOTAL, FREET4, T3FREE, THYROIDAB in the last 72 hours. Anemia Panel: No results for input(s): VITAMINB12, FOLATE, FERRITIN, TIBC, IRON, RETICCTPCT in the last 72 hours. Sepsis Labs: No results for input(s): PROCALCITON, LATICACIDVEN in the last 168 hours.  Recent Results (from the past 240 hour(s))  Blood culture (routine x 2)     Status: Abnormal   Collection Time: 06/23/19  8:58 PM   Specimen: BLOOD LEFT FOREARM  Result Value Ref Range Status   Specimen Description BLOOD LEFT FOREARM  Final   Special Requests   Final    BOTTLES DRAWN AEROBIC AND ANAEROBIC Blood Culture adequate volume   Culture  Setup Time   Final    ANAEROBIC BOTTLE ONLY GRAM NEGATIVE RODS CRITICAL VALUE NOTED.  VALUE IS CONSISTENT WITH PREVIOUSLY REPORTED AND CALLED VALUE. Performed at [facility], [place] [address] [place], [place] [address]    Culture KLEBSIELLA PNEUMONIAE (A)  Final   Report Status 06/26/2019 FINAL  Final   Organism ID, Bacteria KLEBSIELLA PNEUMONIAE  Final      Susceptibility   Klebsiella pneumoniae - MIC*    AMPICILLIN RESISTANT Resistant     CEFAZOLIN <=4 SENSITIVE Sensitive     CEFEPIME <=1 SENSITIVE Sensitive     CEFTAZIDIME <=1 SENSITIVE Sensitive     CEFTRIAXONE <=1 SENSITIVE Sensitive     CIPROFLOXACIN <=0.25 SENSITIVE Sensitive     GENTAMICIN <=1 SENSITIVE Sensitive     IMIPENEM <=0.25 SENSITIVE Sensitive     TRIMETH/SULFA <=20 SENSITIVE Sensitive     AMPICILLIN/SULBACTAM 4 SENSITIVE Sensitive     PIP/TAZO <=4 SENSITIVE Sensitive     Extended ESBL NEGATIVE Sensitive     * KLEBSIELLA PNEUMONIAE  Blood culture (routine x 2)     Status: Abnormal   Collection Time: 06/23/19  9:03 PM   Specimen: BLOOD  Result Value Ref Range Status   Specimen Description BLOOD RIGHT ANTECUBITAL   Final   Special Requests   Final    BOTTLES DRAWN AEROBIC AND ANAEROBIC Blood Culture adequate volume   Culture  Setup Time   Final    GRAM NEGATIVE RODS ANAEROBIC BOTTLE ONLY CRITICAL RESULT CALLED TO, READ BACK BY AND VERIFIED WITH: Reece Packer PharmD 17:10 06/24/19 ([email or web address]) AEROBIC BOTTLE ONLY GRAM POSITIVE COCCI CRITICAL RESULT CALLED TO, READ BACK BY AND VERIFIED WITH: L SEAY PHARMD 06/24/19 2342 JDW    Culture (  A)  Final    ESCHERICHIA COLI STAPHYLOCOCCUS SPECIES (COAGULASE NEGATIVE) THE SIGNIFICANCE OF ISOLATING THIS ORGANISM FROM A SINGLE SET OF BLOOD CULTURES WHEN MULTIPLE SETS ARE DRAWN IS UNCERTAIN. PLEASE NOTIFY THE MICROBIOLOGY DEPARTMENT WITHIN ONE WEEK IF SPECIATION AND SENSITIVITIES ARE REQUIRED. Performed at [facility], [place] [address] [place], [place] [address]    Report Status 06/26/2019 FINAL  Final   Organism ID, Bacteria ESCHERICHIA COLI  Final      Susceptibility   Escherichia coli - MIC*    AMPICILLIN 4 SENSITIVE Sensitive     CEFAZOLIN <=4 SENSITIVE Sensitive     CEFEPIME <=1 SENSITIVE Sensitive     CEFTAZIDIME <=1 SENSITIVE Sensitive     CEFTRIAXONE <=1 SENSITIVE Sensitive     CIPROFLOXACIN 1 SENSITIVE Sensitive     GENTAMICIN <=1 SENSITIVE Sensitive     IMIPENEM <=0.25 SENSITIVE Sensitive     TRIMETH/SULFA <=20 SENSITIVE Sensitive     AMPICILLIN/SULBACTAM <=2 SENSITIVE Sensitive     PIP/TAZO <=4 SENSITIVE Sensitive     Extended ESBL NEGATIVE Sensitive     * ESCHERICHIA COLI  Blood Culture ID Panel (Reflexed)     Status: Abnormal   Collection Time: 06/23/19  9:03 PM  Result Value Ref Range Status   Enterococcus species NOT DETECTED NOT DETECTED Final   Listeria monocytogenes NOT DETECTED NOT DETECTED Final   Staphylococcus species NOT DETECTED NOT DETECTED Final   Staphylococcus aureus (BCID) NOT DETECTED NOT DETECTED Final   Streptococcus species NOT DETECTED NOT DETECTED Final   Streptococcus agalactiae NOT DETECTED NOT DETECTED Final    Streptococcus pneumoniae NOT DETECTED NOT DETECTED Final   Streptococcus pyogenes NOT DETECTED NOT DETECTED Final   Acinetobacter baumannii NOT DETECTED NOT DETECTED Final   Enterobacteriaceae species DETECTED (A) NOT DETECTED Final    Comment: Enterobacteriaceae represent a large family of gram-negative bacteria, not a single organism. CRITICAL RESULT CALLED TO, READ BACK BY AND VERIFIED WITH: Reece Packer PharmD 17:10 06/24/19 ([email or web address])    Enterobacter cloacae complex NOT DETECTED NOT DETECTED Final   Escherichia coli DETECTED (A) NOT DETECTED Final    Comment: CRITICAL RESULT CALLED TO, READ BACK BY AND VERIFIED WITH: Reece Packer PharmD 17:10 06/24/19 ([email or web address])    Klebsiella oxytoca NOT DETECTED NOT DETECTED Final   Klebsiella pneumoniae NOT DETECTED NOT DETECTED Final   Proteus species NOT DETECTED NOT DETECTED Final   Serratia marcescens NOT DETECTED NOT DETECTED Final   Carbapenem resistance NOT DETECTED NOT DETECTED Final   Haemophilus influenzae NOT DETECTED NOT DETECTED Final   Neisseria meningitidis NOT DETECTED NOT DETECTED Final   Pseudomonas aeruginosa NOT DETECTED NOT DETECTED Final   Candida albicans NOT DETECTED NOT DETECTED Final   Candida glabrata NOT DETECTED NOT DETECTED Final   Candida krusei NOT DETECTED NOT DETECTED Final   Candida parapsilosis NOT DETECTED NOT DETECTED Final   Candida tropicalis NOT DETECTED NOT DETECTED Final    Comment: Performed at [facility], [place] [address] [place], [place] [address]  Blood Culture ID Panel (Reflexed)     Status: Abnormal   Collection Time: 06/23/19  9:03 PM  Result Value Ref Range Status   Enterococcus species NOT DETECTED NOT DETECTED Final   Listeria monocytogenes NOT DETECTED NOT DETECTED Final   Staphylococcus species DETECTED (A) NOT DETECTED Final    Comment: Methicillin (oxacillin) susceptible coagulase negative staphylococcus. Possible blood culture contaminant (unless isolated from more than one blood culture draw  or clinical case suggests pathogenicity). No antibiotic  treatment is indicated for blood  culture contaminants. CRITICAL RESULT CALLED TO, READ BACK BY AND VERIFIED WITH: L SEAY PHARMD 06/24/19 2342 JDW    Staphylococcus aureus (BCID) NOT DETECTED NOT DETECTED Final   Methicillin resistance NOT DETECTED NOT DETECTED Final   Streptococcus species NOT DETECTED NOT DETECTED Final   Streptococcus agalactiae NOT DETECTED NOT DETECTED Final   Streptococcus pneumoniae NOT DETECTED NOT DETECTED Final   Streptococcus pyogenes NOT DETECTED NOT DETECTED Final   Acinetobacter baumannii NOT DETECTED NOT DETECTED Final   Enterobacteriaceae species NOT DETECTED NOT DETECTED Final   Enterobacter cloacae complex NOT DETECTED NOT DETECTED Final   Escherichia coli NOT DETECTED NOT DETECTED Final   Klebsiella oxytoca NOT DETECTED NOT DETECTED Final   Klebsiella pneumoniae NOT DETECTED NOT DETECTED Final   Proteus species NOT DETECTED NOT DETECTED Final   Serratia marcescens NOT DETECTED NOT DETECTED Final   Haemophilus influenzae NOT DETECTED NOT DETECTED Final   Neisseria meningitidis NOT DETECTED NOT DETECTED Final   Pseudomonas aeruginosa NOT DETECTED NOT DETECTED Final   Candida albicans NOT DETECTED NOT DETECTED Final   Candida glabrata NOT DETECTED NOT DETECTED Final   Candida krusei NOT DETECTED NOT DETECTED Final   Candida parapsilosis NOT DETECTED NOT DETECTED Final   Candida tropicalis NOT DETECTED NOT DETECTED Final    Comment: Performed at [facility], [place] [address] [place], [place] [address]  SARS Coronavirus 2 [facility] order, Performed in [facility] lab) Nasopharyngeal Nasopharyngeal Swab     Status: None   Collection Time: 06/23/19  9:56 PM   Specimen: Nasopharyngeal Swab  Result Value Ref Range Status   SARS Coronavirus 2 NEGATIVE NEGATIVE Final    Comment: (NOTE) If result is NEGATIVE SARS-CoV-2 target nucleic acids are NOT DETECTED. The SARS-CoV-2 RNA is generally  detectable in upper and lower  respiratory specimens during the acute phase of infection. The lowest  concentration of SARS-CoV-2 viral copies this assay can detect is 250  copies / mL. A negative result does not preclude SARS-CoV-2 infection  and should not be used as the sole basis for treatment or other  patient management decisions.  A negative result may occur with  improper specimen collection / handling, submission of specimen other  than nasopharyngeal swab, presence of viral mutation(s) within the  areas targeted by this assay, and inadequate number of viral copies  (<250 copies / mL). A negative result must be combined with clinical  observations, patient history, and epidemiological information. If result is POSITIVE SARS-CoV-2 target nucleic acids are DETECTED. The SARS-CoV-2 RNA is generally detectable in upper and lower  respiratory specimens dur ing the acute phase of infection.  Positive  results are indicative of active infection with SARS-CoV-2.  Clinical  correlation with patient history and other diagnostic information is  necessary to determine patient infection status.  Positive results do  not rule out bacterial infection or co-infection with other viruses. If result is PRESUMPTIVE POSTIVE SARS-CoV-2 nucleic acids MAY BE PRESENT.   A presumptive positive result was obtained on the submitted specimen  and confirmed on repeat testing.  While 2019 novel coronavirus  (SARS-CoV-2) nucleic acids may be present in the submitted sample  additional confirmatory testing may be necessary for epidemiological  and / or clinical management purposes  to differentiate between  SARS-CoV-2 and other Sarbecovirus currently known to infect humans.  If clinically indicated additional testing with an alternate test  methodology [phone]) is advised. The SARS-CoV-2 RNA is generally  detectable in upper and lower respiratory sp ecimens during the acute  phase of infection. The  expected result is Negative. Fact Sheet for Patients:  StrictlyIdeas.no Fact Sheet for Healthcare Providers: BankingDealers.co.za This test is not yet approved or cleared by the [place] FDA and has been authorized for detection and/or diagnosis of SARS-CoV-2 by FDA under an Emergency Use Authorization (EUA).  This EUA will remain in effect (meaning this test can be used) for the duration of the COVID-19 declaration under Section 564(b)(1) of the Act, 21 U.S.C. section 360bbb-3(b)(1), unless the authorization is terminated or revoked sooner. Performed at [facility], [place] [address] [place], [place] [address]   Culture, blood (routine x 2)     Status: None (Preliminary result)   Collection Time: 06/25/19  2:30 PM   Specimen: BLOOD  Result Value Ref Range Status   Specimen Description BLOOD LEFT ANTECUBITAL  Final   Special Requests   Final    BOTTLES DRAWN AEROBIC AND ANAEROBIC Blood Culture adequate volume   Culture   Final    NO GROWTH 2 DAYS Performed at [facility], [place] [address] [place], [place] [address]    Report Status PENDING  Incomplete  Culture, blood (routine x 2)     Status: None (Preliminary result)   Collection Time: 06/25/19  3:00 PM   Specimen: BLOOD LEFT ARM  Result Value Ref Range Status   Specimen Description BLOOD LEFT ARM  Final   Special Requests   Final    BOTTLES DRAWN AEROBIC AND ANAEROBIC Blood Culture adequate volume   Culture   Final    NO GROWTH 2 DAYS Performed at [facility], [place] [address] [place], [place] [address]    Report Status PENDING  Incomplete  Surgical PCR screen     Status: None   Collection Time: 06/26/19  6:34 AM   Specimen: Nasal Mucosa; Nasal Swab  Result Value Ref Range Status   MRSA, PCR NEGATIVE NEGATIVE Final   Staphylococcus aureus NEGATIVE NEGATIVE Final    Comment: (NOTE) The Xpert SA Assay (FDA approved for NASAL specimens in patients  64 years of age and older), is one component of a comprehensive surveillance program. It is not intended to diagnose infection nor to guide or monitor treatment. Performed at [facility], [place] [address] [place],  [address]          Radiology Studies: Dg Ercp Biliary & Pancreatic Ducts  Result Date: 06/26/2019 CLINICAL DATA:  Obstructive jaundice. ERCP with biliary stent placement. EXAM: ERCP TECHNIQUE: Multiple spot images obtained with the fluoroscopic device and submitted for interpretation post-procedure. COMPARISON:  CT the chest, abdomen pelvis-06/23/2019; MRCP-06/24/2019 FINDINGS: 13 spot intraoperative fluoroscopic images of the right upper abdominal quadrant during ERCP are provided for review Initial image demonstrates an ERCP probe overlying the right upper abdominal quadrant. Subsequent images demonstrate selective cannulation opacification of the common bile duct. Subsequent images demonstrate insufflation of a balloon within the central aspect of the CBD with subsequent biliary sweeping and presumed sphincterotomy with additional images demonstrate biliary plasty at the level of the ampulla (image labeled 11). There is minimal opacification of the intrahepatic biliary tree which appears mildly dilated. There is minimal opacification of the cystic duct with minimal passage of contrast into the gallbladder lumen. There is no definitive opacification of the pancreatic duct. Completion image demonstrates placement of an internal plastic biliary stent. IMPRESSION: ERCP with biliary sweeping, sphincterotomy / biliary plasty and internal  biliary stent placement as above. These images were submitted for radiologic interpretation only. Please see the procedural report for the amount of contrast and the fluoroscopy time utilized. Electronically Signed   By: Sandi Mariscal M.D.   On: 06/26/2019 13:13        Scheduled Meds:  finasteride  5 mg Oral Daily   insulin aspart  0-5  Units Subcutaneous QHS   insulin aspart  0-9 Units Subcutaneous TID WC   metoprolol tartrate  25 mg Oral BID   mupirocin ointment  1 application Nasal BID   tamsulosin  0.4 mg Oral Daily   Continuous Infusions:  sodium chloride Stopped (06/26/19 1402)   ampicillin-sulbactam (UNASYN) IV 3 g (06/27/19 0529)     LOS: 3 days    Time spent: 40 minutes    Irine Seal, MD [facility]  If 7PM-7AM, please contact night-coverage [email or web address] 06/27/2019, 11:52 AM

## 2019-06-27 NOTE — Progress Notes (Signed)
Daily Rounding Note  06/27/2019, 11:02 AM  LOS: 3 days   SUBJECTIVE:   Chief complaint: Choledocholithiasis, elevated LFTs.  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  S/p stent to right hepatic duct No abdominal pain.  No nausea.  Tolerating clear liquids.  Feels uncomfortable in his back and asked if the bed could be repositioned.  OBJECTIVE:         Vital signs in last 24 hours:    Temp:  [97.5 F (36.4 C)-98.4 F (36.9 C)] 98.2 F (36.8 C) (08/08 0835) Pulse Rate:  [55-69] 58 (08/08 0835) Resp:  [12-18] 18 (08/08 0835) BP: (127-159)/(57-82) 136/62 (08/08 0835) SpO2:  [95 %-100 %] 98 % (08/08 0835)   Filed Weights   06/24/19 0045 06/24/19 0141  Weight: 77.4 kg 75 kg   General: Looks well.  Comfortable. Heart: RRR. Chest: Clear bilaterally.  No labored breathing or cough. Abdomen: Not tender.  Not distended.  Active bowel sounds. Extremities: No CCE. Neuro/Psych: Appropriate.  Follows commands.  Moves all 4 limbs.  Intake/Output from previous day: 08/07 0701 - 08/08 0700 In: 913.6 [I.V.:906.3; IV Piggyback:7.3] Out: 650 [Urine:650]  Intake/Output this shift: Total I/O In: -  Out: 425 [Urine:425]  Lab Results: Recent Labs    06/26/19 0524 06/26/19 2058 06/27/19 0502  WBC 8.6 8.9 8.2  HGB 11.7* 12.1* 11.9*  HCT 34.4* 35.5* 34.5*  PLT 135* 144* 138*   BMET Recent Labs    06/25/19 0324 06/26/19 0524 06/27/19 0502  NA 137 137 140  K 4.0 4.0 3.9  CL 106 105 110  CO2 21* 22 22  GLUCOSE 93 133* 99  BUN 12 10 9   CREATININE 1.40* 1.36* 1.09  CALCIUM 7.6* 7.9* 7.7*   LFT Recent Labs    06/25/19 0324 06/26/19 0524 06/27/19 0502  PROT 5.1* 5.1* 5.2*  ALBUMIN 2.6* 2.5* 2.5*  AST 261* 80* 39  ALT 352* 220* 156*  ALKPHOS 186* 162* 154*  BILITOT 1.0 0.8 0.7   PT/INR Recent Labs    06/26/19 0524  LABPROT 14.6  INR 1.2   Hepatitis Panel No results for input(s): HEPBSAG, HCVAB, HEPAIGM, HEPBIGM in  the last 72 hours.  Studies/Results: Dg Ercp Biliary & Pancreatic Ducts  Result Date: 06/26/2019 CLINICAL DATA:  Obstructive jaundice. ERCP with biliary stent placement. EXAM: ERCP TECHNIQUE: Multiple spot images obtained with the fluoroscopic device and submitted for interpretation post-procedure. COMPARISON:  CT the chest, abdomen pelvis-06/23/2019; MRCP-06/24/2019 FINDINGS: 13 spot intraoperative fluoroscopic images of the right upper abdominal quadrant during ERCP are provided for review Initial image demonstrates an ERCP probe overlying the right upper abdominal quadrant. Subsequent images demonstrate selective cannulation opacification of the common bile duct. Subsequent images demonstrate insufflation of a balloon within the central aspect of the CBD with subsequent biliary sweeping and presumed sphincterotomy with additional images demonstrate biliary plasty at the level of the ampulla (image labeled 11). There is minimal opacification of the intrahepatic biliary tree which appears mildly dilated. There is minimal opacification of the cystic duct with minimal passage of contrast into the gallbladder lumen. There is no definitive opacification of the pancreatic duct. Completion image demonstrates placement of an internal plastic biliary stent. IMPRESSION: ERCP with biliary sweeping, sphincterotomy / biliary plasty and internal biliary stent placement as above. These images were submitted for radiologic interpretation only. Please see the procedural report for the amount of contrast and the fluoroscopy time utilized. Electronically Signed   By: Sandi Mariscal M.D.   On:  06/26/2019 13:13   Scheduled Meds: . finasteride  5 mg Oral Daily  . insulin aspart  0-5 Units Subcutaneous QHS  . insulin aspart  0-9 Units Subcutaneous TID WC  . metoprolol tartrate  25 mg Oral BID  . mupirocin ointment  1 application Nasal BID  . tamsulosin  0.4 mg Oral Daily   Continuous Infusions: . sodium chloride Stopped  (06/26/19 1402)  . ampicillin-sulbactam (UNASYN) IV 3 g (06/27/19 0529)   PRN Meds:.phenol   ASSESMENT:   *   New elevation LFTs, leukocytosis, abdominal pain in patient with presumed cholangiocarcinoma. 06/26/2019 ERCP. indeterminate stricture in the right hepatic duct, brushed.  Choledocholithiasis, sludge, hemobilia removed with balloon sweep following biliary sphincterotomy and balloon sphincteroplasty.  Tissue from main biliary tree suctioned and submitted for histology.  10 Fr 12 cm plastic stent placed to right hepatic duct.  Also noted: J-shaped gastric deformity, duodenal diverticulum, normal major papilla. LFTs much improved.  PRBCs 17.7 >> 8.2  *    Biliary sepsis, cholangitis.  Blood cultures growing E. coli, Enterobacter.   Cefepime, metronidazole for 1 day, Zosyn for 3 days, day 2 Unasyn  PLAN   *  Await cytology studies from bile duct.    *   Duration of antibiotics for bacteremia needs to be defined.  I would say at least 7 if not 10 days.  *  Advance to carb mod diet    Azucena Freed  06/27/2019, 11:02 AM Phone [phone]

## 2019-06-28 ENCOUNTER — Encounter (HOSPITAL_COMMUNITY): Payer: Self-pay | Admitting: Gastroenterology

## 2019-06-28 DIAGNOSIS — B961 Klebsiella pneumoniae [K. pneumoniae] as the cause of diseases classified elsewhere: Secondary | ICD-10-CM

## 2019-06-28 DIAGNOSIS — R740 Nonspecific elevation of levels of transaminase and lactic acid dehydrogenase [LDH]: Secondary | ICD-10-CM

## 2019-06-28 LAB — GLUCOSE, CAPILLARY
Glucose-Capillary: 92 mg/dL (ref 70–99)
Glucose-Capillary: 104 mg/dL — ABNORMAL HIGH (ref 70–99)
Glucose-Capillary: 90 mg/dL (ref 70–99)
Glucose-Capillary: 96 mg/dL (ref 70–99)

## 2019-06-28 LAB — COMPREHENSIVE METABOLIC PANEL
ALT: 108 U/L — ABNORMAL HIGH (ref 0–44)
AST: 25 U/L (ref 15–41)
Albumin: 2.5 g/dL — ABNORMAL LOW (ref 3.5–5.0)
Alkaline Phosphatase: 135 U/L — ABNORMAL HIGH (ref 38–126)
Anion gap: 9 (ref 5–15)
BUN: 6 mg/dL — ABNORMAL LOW (ref 8–23)
CO2: 23 mmol/L (ref 22–32)
Calcium: 8 mg/dL — ABNORMAL LOW (ref 8.9–10.3)
Chloride: 109 mmol/L (ref 98–111)
Creatinine, Ser: 1.07 mg/dL (ref 0.61–1.24)
GFR calc Af Amer: >60 mL/min (ref >60)
GFR calc non Af Amer: >60 mL/min (ref >60)
Glucose, Bld: 100 mg/dL — ABNORMAL HIGH (ref 70–99)
Potassium: 3.6 mmol/L (ref 3.5–5.1)
Sodium: 141 mmol/L (ref 135–145)
Total Bilirubin: 0.7 mg/dL (ref 0.3–1.2)
Total Protein: 5.3 g/dL — ABNORMAL LOW (ref 6.5–8.1)

## 2019-06-28 LAB — CBC WITH DIFFERENTIAL/PLATELET
Abs Immature Granulocytes: 0.03 10*3/uL (ref 0.00–0.07)
Abs Immature Granulocytes: 0.04 10*3/uL (ref 0.00–0.07)
Basophils Absolute: 0 10*3/uL (ref 0.0–0.1)
Basophils Absolute: 0 10*3/uL (ref 0.0–0.1)
Basophils Relative: 0 %
Basophils Relative: 1 %
Eosinophils Absolute: 0.1 10*3/uL (ref 0.0–0.5)
Eosinophils Absolute: 0.1 10*3/uL (ref 0.0–0.5)
Eosinophils Relative: 2 %
Eosinophils Relative: 2 %
HCT: 33.8 % — ABNORMAL LOW (ref 39.0–52.0)
HCT: 35.4 % — ABNORMAL LOW (ref 39.0–52.0)
Hemoglobin: 11.5 g/dL — ABNORMAL LOW (ref 13.0–17.0)
Hemoglobin: 12.4 g/dL — ABNORMAL LOW (ref 13.0–17.0)
Immature Granulocytes: 1 %
Immature Granulocytes: 1 %
Lymphocytes Relative: 21 %
Lymphocytes Relative: 28 %
Lymphs Abs: 1.3 10*3/uL (ref 0.7–4.0)
Lymphs Abs: 1.8 10*3/uL (ref 0.7–4.0)
MCH: 33.9 pg (ref 26.0–34.0)
MCH: 34.3 pg — ABNORMAL HIGH (ref 26.0–34.0)
MCHC: 34 g/dL (ref 30.0–36.0)
MCHC: 35 g/dL (ref 30.0–36.0)
MCV: 99.7 fL (ref 80.0–100.0)
MCV: 98.1 fL (ref 80.0–100.0)
Monocytes Absolute: 0.7 10*3/uL (ref 0.1–1.0)
Monocytes Absolute: 0.8 10*3/uL (ref 0.1–1.0)
Monocytes Relative: 12 %
Monocytes Relative: 12 %
Neutro Abs: 3.9 10*3/uL (ref 1.7–7.7)
Neutro Abs: 3.6 10*3/uL (ref 1.7–7.7)
Neutrophils Relative %: 64 %
Neutrophils Relative %: 56 %
Platelets: 147 10*3/uL — ABNORMAL LOW (ref 150–400)
Platelets: 152 10*3/uL (ref 150–400)
RBC: 3.39 MIL/uL — ABNORMAL LOW (ref 4.22–5.81)
RBC: 3.61 MIL/uL — ABNORMAL LOW (ref 4.22–5.81)
RDW: 14.5 % (ref 11.5–15.5)
RDW: 14.2 % (ref 11.5–15.5)
WBC: 6 10*3/uL (ref 4.0–10.5)
WBC: 6.3 10*3/uL (ref 4.0–10.5)
nRBC: 0 % (ref 0.0–0.2)
nRBC: 0 % (ref 0.0–0.2)

## 2019-06-28 MED ORDER — ALLOPURINOL 100 MG PO TABS
100.0000 mg | ORAL_TABLET | ORAL | Status: DC
  Administered 2019-06-29: 100 mg via ORAL
  Filled 2019-06-28: qty 1

## 2019-06-28 MED ORDER — ALPRAZOLAM 0.25 MG PO TABS
0.2500 mg | ORAL_TABLET | Freq: Every evening | ORAL | Status: DC | PRN
  Administered 2019-06-28: 0.25 mg via ORAL
  Filled 2019-06-28: qty 1

## 2019-06-28 MED ORDER — SODIUM FLUORIDE 1.1 % DT CREA: 1.0000 "application " | TOPICAL_CREAM | Freq: Every day | DENTAL | Status: DC

## 2019-06-28 MED ORDER — VITAMIN B-12 1000 MCG PO TABS
1000.0000 ug | ORAL_TABLET | Freq: Every day | ORAL | Status: DC
  Administered 2019-06-28 – 2019-06-29 (×2): 1000 ug via ORAL
  Filled 2019-06-28 (×2): qty 1

## 2019-06-28 MED ORDER — HALOPERIDOL LACTATE 5 MG/ML IJ SOLN
2.0000 mg | Freq: Once | INTRAMUSCULAR | Status: AC
  Administered 2019-06-28: 2 mg via INTRAVENOUS
  Filled 2019-06-28: qty 1

## 2019-06-28 MED ORDER — LISINOPRIL 5 MG PO TABS
5.0000 mg | ORAL_TABLET | Freq: Every day | ORAL | Status: DC
  Administered 2019-06-28 – 2019-06-29 (×2): 5 mg via ORAL
  Filled 2019-06-28 (×2): qty 1

## 2019-06-28 NOTE — Progress Notes (Signed)
[facility]    Date of Admission:  06/23/2019   Total days of antibiotics 6/ unasyn 3           ID: Ricardo Washington is a 84 y.o. male presented with acute cholangitis who underwent ERCP c/b secondary polymicrobial bacteremia. E coli (pan sensitive) and klebsiella pneumoniae in the blood. Also one site with coagulase negative staph species (methicillin sensitive) in 1/4 bottles likely contaminant from phlebotomy. He is improving; now that sensitivities are back will narrow to unasyn IV.  Principal Problem:   Biliary sepsis Active Problems:   Essential hypertension   Diabetes mellitus, type 2 ([place])   LFT elevation   Hypokalemia   Acute kidney injury ([place])   Dementia ([facility])   Cholangiocarcinoma ([facility])   Colitis   Hypomagnesemia   Hypophosphatemia   Acute cholangitis   Biliary stricture   E coli bacteremia   Bacteremia due to Klebsiella pneumoniae    Subjective: Appears much improved, afebrile, no abdominal pain, diarrhea.  Medications:  . [START ON 06/29/2019] allopurinol  100 mg Oral 3 times weekly  . finasteride  5 mg Oral Daily  . insulin aspart  0-5 Units Subcutaneous QHS  . insulin aspart  0-9 Units Subcutaneous TID WC  . lisinopril  5 mg Oral Daily  . metoprolol tartrate  25 mg Oral BID  . mupirocin ointment  1 application Nasal BID  . tamsulosin  0.4 mg Oral Daily  . vitamin B-12  1,000 mcg Oral Daily    Objective: Vital signs in last 24 hours: Temp:  [98.3 F (36.8 C)-98.7 F (37.1 C)] 98.7 F (37.1 C) (08/09 1119) Pulse Rate:  [61-64] 61 (08/09 1119) Resp:  [16-20] 16 (08/09 1119) BP: (106-152)/(56-72) 142/64 (08/09 1119) SpO2:  [93 %-98 %] 94 % (08/09 1119) Physical Exam  Constitutional: He is oriented to person, place. He appears well-developed and well-nourished. No distress.  HENT:  Mouth/Throat: Oropharynx is clear and moist. No oropharyngeal exudate.  Cardiovascular: Normal rate, regular rhythm and normal heart sounds.  Exam reveals no gallop and no friction rub.  No murmur heard.  Pulmonary/Chest: Effort normal and breath sounds normal. No respiratory distress. He has no wheezes.  Abdominal: Soft. Bowel sounds are normal. He exhibits no distension. There is no tenderness.  Neurological: He is alert and oriented to person, place, and time.  Skin: Skin is warm and dry. No rash noted. No erythema.  Psychiatric: He has a normal mood and affect. His behavior is normal.     Lab Results Recent Labs    06/27/19 0502  06/27/19 2113 06/28/19 0459 06/28/19 0917  WBC 8.2   < > 7.0 6.0  --   HGB 11.9*   < > 11.6* 11.5*  --   HCT 34.5*   < > 33.4* 33.8*  --   NA 140  --   --   --  141  K 3.9  --   --   --  3.6  CL 110  --   --   --  109  CO2 22  --   --   --  23  BUN 9  --   --   --  6*  CREATININE 1.09  --   --   --  1.07   < > = values in this interval not displayed.   Liver Panel Recent Labs    06/27/19 0502 06/28/19 0917  PROT 5.2* 5.3*  ALBUMIN 2.5* 2.5*  AST  39 25  ALT 156* 108*  ALKPHOS 154* 135*  BILITOT 0.7 0.7    Microbiology: 8/6 blood cx ngtd 8/4 blood cx ecoli  Studies/Results: No results found.   Assessment/Plan: Biliary sepsis/E. coli and Klebsiella bacteremia/cholangitis Continue on amp/sub, likely plan to treat for 10 day. Can likely change to oral regimen when ready for discharge  transaminitis = improving   [facility] Cell: [phone] Pager: [phone]  06/28/2019, 12:27 PM

## 2019-06-28 NOTE — Progress Notes (Signed)
PROGRESS NOTE    Ricardo Washington  UQJ:[id number] DOB: [birth year] DOA: 06/23/2019 PCP: [name], MD    Brief Narrative:  HPI per Dr. Marthenia Rolling Patient is an 84 year old Caucasian male who is unable to provide any significant history due to dementing illness.  Patient could not or not tell me why he came to the hospital.  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  Patient is said to have presented with back and abdominal pain with abnormal LFTs.  Bilirubin is only 2, AST is 1061 and ALT is 463 with alkaline phosphatase of 256 (up from 105).  CT Angio of the chest, abdomen and pelvis CT revealed "No evidence of aneurysmal dilatation or dissection within the thoracic and abdominal aorta.   Stable segmental biliary dilatation within segment 8 of the liver stable from the prior exam. Mild decreased attenuation is noted centrally similar to that seen on prior MRI. No significant enlargement is noted.  Wall thickening within the rectosigmoid consistent with focal colitis. No abscess or perforation is noted.  Chronic changes as described above".  According to Dr. Gilford Raid, ER provider, she discussed above findings with the GI doctor, Dr. [name], and Dr. [name] has asked the hospitalist team to admit patient for possible impending sepsis based on prior clinical presentation.  ED Course: On presentation to the ER, patient was afebrile, blood pressure of 84-1 171/90 125 mmHg, heart rate of 77 to 103 bpm and respiratory rate of 18 and O2 sat of 95%.  Patient has been cultured.  IV cefepime have been given.  CT scan is as above.  No leukocytosis.  Pertinent labs: Chemistry reveals sodium of 139, potassium of 3.7, chloride 107, CO2 of 19, BUN of 15, creatinine of 1.17  with a blood sugar of 162.  Alkaline phosphatase is 256, albumin is 3.4, lipase is 22, AST is 1061, ALT is 463, total protein is 6.1, direct bilirubin is 1.4, total bilirubin is 2.  CBC reveals WBC of 7.6, hemoglobin 14.4, hematocrit of 40.8, MCV of 103.6, platelet count of 135.  Assessment & Plan:   Principal Problem:   Biliary sepsis Active Problems:   Acute cholangitis   Essential hypertension   Diabetes mellitus, type 2 (HCC)   LFT elevation   Hypokalemia   Acute kidney injury ([place])   Dementia (HCC)   Cholangiocarcinoma (HCC)   Colitis   Hypomagnesemia   Hypophosphatemia   Biliary stricture   E coli bacteremia   Bacteremia due to Klebsiella pneumoniae  1. Biliary sepsis/E. coli and Klebsiella bacteremia/cholangitis Patient currently afebrile, borderline blood pressure, with no tachycardia or tachypnea. Patient with leukocytosis which is trending back down. Blood cultures positive for E. coli and Klebsiella. Blood cultures with also staph aureus likely contaminant. Transaminitis trending down. Acute hepatitis panel negative. Patient had MRCP evening 06/24/2019. Patient being followed by GI and underwent ERCP on 06/26/2019 which showed indeterminate stricture in the right hepatic duct with brushings taken, choledocholithiasis, biliary sludge, hemobilia which was removed with balloon sweep following biliary sphincterotomy and balloon sphincterotome plasty.  Tissues samples obtained from main biliary tree currently pending.  10 French 12 cm plastic stent placed to the right hepatic duct.  J-shaped gastric deformity noted, duodenal diverticulum.  LFTs trending down.  Continue empiric IV Unasyn.  Repeat blood cultures with  no growth to date.  GI and ID following and appreciate input and recommendations.   2.Electrolyte abnormality/hypomagnesemia/hypophosphatemia Repleted.  3. Dementia Stable.  4. Hypertension Blood pressure elevated this morning. Continue home regimen metoprolol,  Proscar, Flomax.  Resume home regimen lisinopril 5 mg daily.  Follow.  5.  Acute kidney injury Improving with hydration.  IV fluids have been saline locked.  Continue to hold nephrotoxins.  Follow.  6.  Diabetes mellitus type 2 Hemoglobin A1c was 6.1 in July 2020.  CBG of 92 this morning.  Continue to hold oral hypoglycemic agents.  Sliding scale insulin.   DVT prophylaxis: SCDs Code Status: Full Family Communication: Updated patient.  No family at bedside. Disposition Plan: Home once cleared by ID and gastroenterology.   Consultants:   Gastroenterology: Dr. Hilarie Fredrickson 06/24/2019  Infectious disease: Dr. Tommy Medal 06/25/2019  Procedures:   CT angiogram chest abdomen and pelvis 06/23/2019  ERCP with stent placement per Dr. Rush Landmark 06/26/2019  MRCP 06/24/2019  Antimicrobials:  IV Unasyn  06/26/2019  IV Zosyn 06/23/2019>>>> 06/26/2019       Subjective: Patient sitting up in bed.  Has just finished his lunch.  Denies any chest pain, no shortness of breath, no abdominal pain.  Denies any bleeding.  Tolerating current oral intake.  Feels well.    Objective: Vitals:   06/27/19 2035 06/28/19 0359 06/28/19 0727 06/28/19 1119  BP: (!) 151/67 (!) 147/69 (!) 152/72 (!) 142/64  Pulse:  62 62 61  Resp: 20 18 16 16   Temp: 98.5 F (36.9 C) 98.3 F (36.8 C) 98.6 F (37 C) 98.7 F (37.1 C)  TempSrc: Oral Oral Oral Oral  SpO2: 98% 93% 95% 94%  Weight:        Intake/Output Summary (Last 24 hours) at 06/28/2019 1332 Last data filed at 06/28/2019 0511 Gross per 24 hour  Intake 120 ml  Output 450 ml  Net -330 ml   Filed Weights   06/24/19 0045 06/24/19 0141  Weight: 77.4 kg 75 kg    Examination:  General exam: NAD Respiratory system: CTAB. Respiratory effort normal. Cardiovascular system: Regular rate and rhythm no murmurs rubs or gallops.  No JVD.  No lower extremity edema.  Gastrointestinal system: Abdomen is soft, nontender, nondistended, positive bowel sounds.  No rebound.  No  guarding. Central nervous system: Alert and oriented. No focal neurological deficits. Extremities: Symmetric 5 x 5 power. Skin: No rashes, lesions or ulcers Psychiatry: Judgement and insight appear normal. Mood & affect appropriate.     Data Reviewed: I have personally reviewed following labs and imaging studies  CBC: Recent Labs  Lab 06/26/19 2058 06/27/19 0502 06/27/19 1410 06/27/19 2113 06/28/19 0459  WBC 8.9 8.2 6.8 7.0 6.0  NEUTROABS 6.7 6.4 4.5 4.4 3.9  HGB 12.1* 11.9* 11.8* 11.6* 11.5*  HCT 35.5* 34.5* 34.2* 33.4* 33.8*  MCV 100.6* 99.1 98.6 98.2 99.7  PLT 144* 138* 144* 142* 376*   Basic Metabolic Panel: Recent Labs  Lab 06/24/19 0223 06/25/19 0324 06/26/19 0524 06/27/19 0502 06/28/19 0917  NA 140 137 137 140 141  K 3.4* 4.0 4.0 3.9 3.6  CL 106 106 105 110 109  CO2 22 21* 22 22 23   GLUCOSE 122* 93 133* 99 100*  BUN 15 12 10 9  6*  CREATININE 1.30* 1.40* 1.36* 1.09 1.07  CALCIUM 7.9* 7.6* 7.9* 7.7* 8.0*  MG 1.2* 1.9  --  1.9  --   PHOS 2.4* 3.2  --  2.5  --    GFR: Estimated  Creatinine Clearance: 50.4 mL/min (by C-G formula based on SCr of 1.07 mg/dL). Liver Function Tests: Recent Labs  Lab 06/24/19 0223 06/25/19 0324 06/26/19 0524 06/27/19 0502 06/28/19 0917  AST 1,075* 261* 80* 39 25  ALT 675* 352* 220* 156* 108*  ALKPHOS 255* 186* 162* 154* 135*  BILITOT 2.5* 1.0 0.8 0.7 0.7  PROT 5.6* 5.1* 5.1* 5.2* 5.3*  ALBUMIN 2.9* 2.6* 2.5* 2.5* 2.5*   Recent Labs  Lab 06/23/19 1717  LIPASE 22   No results for input(s): AMMONIA in the last 168 hours. Coagulation Profile: Recent Labs  Lab 06/24/19 0223 06/26/19 0524  INR 1.5* 1.2   Cardiac Enzymes: No results for input(s): CKTOTAL, CKMB, CKMBINDEX, TROPONINI in the last 168 hours. BNP (last 3 results) No results for input(s): PROBNP in the last 8760 hours. HbA1C: No results for input(s): HGBA1C in the last 72 hours. CBG: Recent Labs  Lab 06/27/19 1144 06/27/19 1650 06/27/19 2052  06/28/19 0628 06/28/19 1231  GLUCAP 147* 110* 103* 92 104*   Lipid Profile: No results for input(s): CHOL, HDL, LDLCALC, TRIG, CHOLHDL, LDLDIRECT in the last 72 hours. Thyroid Function Tests: No results for input(s): TSH, T4TOTAL, FREET4, T3FREE, THYROIDAB in the last 72 hours. Anemia Panel: No results for input(s): VITAMINB12, FOLATE, FERRITIN, TIBC, IRON, RETICCTPCT in the last 72 hours. Sepsis Labs: No results for input(s): PROCALCITON, LATICACIDVEN in the last 168 hours.  Recent Results (from the past 240 hour(s))  Blood culture (routine x 2)     Status: Abnormal   Collection Time: 06/23/19  8:58 PM   Specimen: BLOOD LEFT FOREARM  Result Value Ref Range Status   Specimen Description BLOOD LEFT FOREARM  Final   Special Requests   Final    BOTTLES DRAWN AEROBIC AND ANAEROBIC Blood Culture adequate volume   Culture  Setup Time   Final    ANAEROBIC BOTTLE ONLY GRAM NEGATIVE RODS CRITICAL VALUE NOTED.  VALUE IS CONSISTENT WITH PREVIOUSLY REPORTED AND CALLED VALUE. Performed at [facility], [place] [address], [place], [place] [address]    Culture KLEBSIELLA PNEUMONIAE (A)  Final   Report Status 06/26/2019 FINAL  Final   Organism ID, Bacteria KLEBSIELLA PNEUMONIAE  Final      Susceptibility   Klebsiella pneumoniae - MIC*    AMPICILLIN RESISTANT Resistant     CEFAZOLIN <=4 SENSITIVE Sensitive     CEFEPIME <=1 SENSITIVE Sensitive     CEFTAZIDIME <=1 SENSITIVE Sensitive     CEFTRIAXONE <=1 SENSITIVE Sensitive     CIPROFLOXACIN <=0.25 SENSITIVE Sensitive     GENTAMICIN <=1 SENSITIVE Sensitive     IMIPENEM <=0.25 SENSITIVE Sensitive     TRIMETH/SULFA <=20 SENSITIVE Sensitive     AMPICILLIN/SULBACTAM 4 SENSITIVE Sensitive     PIP/TAZO <=4 SENSITIVE Sensitive     Extended ESBL NEGATIVE Sensitive     * KLEBSIELLA PNEUMONIAE  Blood culture (routine x 2)     Status: Abnormal   Collection Time: 06/23/19  9:03 PM   Specimen: BLOOD  Result Value Ref Range Status   Specimen  Description BLOOD RIGHT ANTECUBITAL  Final   Special Requests   Final    BOTTLES DRAWN AEROBIC AND ANAEROBIC Blood Culture adequate volume   Culture  Setup Time   Final    GRAM NEGATIVE RODS ANAEROBIC BOTTLE ONLY CRITICAL RESULT CALLED TO, READ BACK BY AND VERIFIED WITH: Reece Packer PharmD 17:10 06/24/19 ([email or web address]) AEROBIC BOTTLE ONLY GRAM POSITIVE COCCI CRITICAL RESULT CALLED TO, READ BACK BY AND VERIFIED WITH:  L SEAY PHARMD 06/24/19 2342 JDW    Culture (A)  Final    ESCHERICHIA COLI STAPHYLOCOCCUS SPECIES (COAGULASE NEGATIVE) THE SIGNIFICANCE OF ISOLATING THIS ORGANISM FROM A SINGLE SET OF BLOOD CULTURES WHEN MULTIPLE SETS ARE DRAWN IS UNCERTAIN. PLEASE NOTIFY THE MICROBIOLOGY DEPARTMENT WITHIN ONE WEEK IF SPECIATION AND SENSITIVITIES ARE REQUIRED. Performed at [facility], [place] [address], [place], [place] [address]    Report Status 06/26/2019 FINAL  Final   Organism ID, Bacteria ESCHERICHIA COLI  Final      Susceptibility   Escherichia coli - MIC*    AMPICILLIN 4 SENSITIVE Sensitive     CEFAZOLIN <=4 SENSITIVE Sensitive     CEFEPIME <=1 SENSITIVE Sensitive     CEFTAZIDIME <=1 SENSITIVE Sensitive     CEFTRIAXONE <=1 SENSITIVE Sensitive     CIPROFLOXACIN 1 SENSITIVE Sensitive     GENTAMICIN <=1 SENSITIVE Sensitive     IMIPENEM <=0.25 SENSITIVE Sensitive     TRIMETH/SULFA <=20 SENSITIVE Sensitive     AMPICILLIN/SULBACTAM <=2 SENSITIVE Sensitive     PIP/TAZO <=4 SENSITIVE Sensitive     Extended ESBL NEGATIVE Sensitive     * ESCHERICHIA COLI  Blood Culture ID Panel (Reflexed)     Status: Abnormal   Collection Time: 06/23/19  9:03 PM  Result Value Ref Range Status   Enterococcus species NOT DETECTED NOT DETECTED Final   Listeria monocytogenes NOT DETECTED NOT DETECTED Final   Staphylococcus species NOT DETECTED NOT DETECTED Final   Staphylococcus aureus (BCID) NOT DETECTED NOT DETECTED Final   Streptococcus species NOT DETECTED NOT DETECTED Final   Streptococcus agalactiae NOT  DETECTED NOT DETECTED Final   Streptococcus pneumoniae NOT DETECTED NOT DETECTED Final   Streptococcus pyogenes NOT DETECTED NOT DETECTED Final   Acinetobacter baumannii NOT DETECTED NOT DETECTED Final   Enterobacteriaceae species DETECTED (A) NOT DETECTED Final    Comment: Enterobacteriaceae represent a large family of gram-negative bacteria, not a single organism. CRITICAL RESULT CALLED TO, READ BACK BY AND VERIFIED WITH: Reece Packer PharmD 17:10 06/24/19 ([email or web address])    Enterobacter cloacae complex NOT DETECTED NOT DETECTED Final   Escherichia coli DETECTED (A) NOT DETECTED Final    Comment: CRITICAL RESULT CALLED TO, READ BACK BY AND VERIFIED WITH: Reece Packer PharmD 17:10 06/24/19 ([email or web address])    Klebsiella oxytoca NOT DETECTED NOT DETECTED Final   Klebsiella pneumoniae NOT DETECTED NOT DETECTED Final   Proteus species NOT DETECTED NOT DETECTED Final   Serratia marcescens NOT DETECTED NOT DETECTED Final   Carbapenem resistance NOT DETECTED NOT DETECTED Final   Haemophilus influenzae NOT DETECTED NOT DETECTED Final   Neisseria meningitidis NOT DETECTED NOT DETECTED Final   Pseudomonas aeruginosa NOT DETECTED NOT DETECTED Final   Candida albicans NOT DETECTED NOT DETECTED Final   Candida glabrata NOT DETECTED NOT DETECTED Final   Candida krusei NOT DETECTED NOT DETECTED Final   Candida parapsilosis NOT DETECTED NOT DETECTED Final   Candida tropicalis NOT DETECTED NOT DETECTED Final    Comment: Performed at [facility], [place] [address], [place], [place] [address]  Blood Culture ID Panel (Reflexed)     Status: Abnormal   Collection Time: 06/23/19  9:03 PM  Result Value Ref Range Status   Enterococcus species NOT DETECTED NOT DETECTED Final   Listeria monocytogenes NOT DETECTED NOT DETECTED Final   Staphylococcus species DETECTED (A) NOT DETECTED Final    Comment: Methicillin (oxacillin) susceptible coagulase negative staphylococcus. Possible blood culture contaminant (unless isolated from  more than one  blood culture draw or clinical case suggests pathogenicity). No antibiotic treatment is indicated for blood  culture contaminants. CRITICAL RESULT CALLED TO, READ BACK BY AND VERIFIED WITH: L SEAY PHARMD 06/24/19 2342 JDW    Staphylococcus aureus (BCID) NOT DETECTED NOT DETECTED Final   Methicillin resistance NOT DETECTED NOT DETECTED Final   Streptococcus species NOT DETECTED NOT DETECTED Final   Streptococcus agalactiae NOT DETECTED NOT DETECTED Final   Streptococcus pneumoniae NOT DETECTED NOT DETECTED Final   Streptococcus pyogenes NOT DETECTED NOT DETECTED Final   Acinetobacter baumannii NOT DETECTED NOT DETECTED Final   Enterobacteriaceae species NOT DETECTED NOT DETECTED Final   Enterobacter cloacae complex NOT DETECTED NOT DETECTED Final   Escherichia coli NOT DETECTED NOT DETECTED Final   Klebsiella oxytoca NOT DETECTED NOT DETECTED Final   Klebsiella pneumoniae NOT DETECTED NOT DETECTED Final   Proteus species NOT DETECTED NOT DETECTED Final   Serratia marcescens NOT DETECTED NOT DETECTED Final   Haemophilus influenzae NOT DETECTED NOT DETECTED Final   Neisseria meningitidis NOT DETECTED NOT DETECTED Final   Pseudomonas aeruginosa NOT DETECTED NOT DETECTED Final   Candida albicans NOT DETECTED NOT DETECTED Final   Candida glabrata NOT DETECTED NOT DETECTED Final   Candida krusei NOT DETECTED NOT DETECTED Final   Candida parapsilosis NOT DETECTED NOT DETECTED Final   Candida tropicalis NOT DETECTED NOT DETECTED Final    Comment: Performed at [facility], [place] [address], [place], [place] [address]  SARS Coronavirus 2 [facility] order, Performed in [facility] hospital lab) Nasopharyngeal Nasopharyngeal Swab     Status: None   Collection Time: 06/23/19  9:56 PM   Specimen: Nasopharyngeal Swab  Result Value Ref Range Status   SARS Coronavirus 2 NEGATIVE NEGATIVE Final    Comment: (NOTE) If result is NEGATIVE SARS-CoV-2 target nucleic acids are NOT DETECTED.  The SARS-CoV-2 RNA is generally detectable in upper and lower  respiratory specimens during the acute phase of infection. The lowest  concentration of SARS-CoV-2 viral copies this assay can detect is 250  copies / mL. A negative result does not preclude SARS-CoV-2 infection  and should not be used as the sole basis for treatment or other  patient management decisions.  A negative result may occur with  improper specimen collection / handling, submission of specimen other  than nasopharyngeal swab, presence of viral mutation(s) within the  areas targeted by this assay, and inadequate number of viral copies  (<250 copies / mL). A negative result must be combined with clinical  observations, patient history, and epidemiological information. If result is POSITIVE SARS-CoV-2 target nucleic acids are DETECTED. The SARS-CoV-2 RNA is generally detectable in upper and lower  respiratory specimens dur ing the acute phase of infection.  Positive  results are indicative of active infection with SARS-CoV-2.  Clinical  correlation with patient history and other diagnostic information is  necessary to determine patient infection status.  Positive results do  not rule out bacterial infection or co-infection with other viruses. If result is PRESUMPTIVE POSTIVE SARS-CoV-2 nucleic acids MAY BE PRESENT.   A presumptive positive result was obtained on the submitted specimen  and confirmed on repeat testing.  While 2019 novel coronavirus  (SARS-CoV-2) nucleic acids may be present in the submitted sample  additional confirmatory testing may be necessary for epidemiological  and / or clinical management purposes  to differentiate between  SARS-CoV-2 and other Sarbecovirus currently known to infect humans.  If clinically indicated additional testing with an alternate test  methodology [phone]) is advised. The SARS-CoV-2 RNA is generally  detectable in upper and lower respiratory sp ecimens during the acute   phase of infection. The expected result is Negative. Fact Sheet for Patients:  StrictlyIdeas.no Fact Sheet for Healthcare Providers: BankingDealers.co.za This test is not yet approved or cleared by the [place] FDA and has been authorized for detection and/or diagnosis of SARS-CoV-2 by FDA under an Emergency Use Authorization (EUA).  This EUA will remain in effect (meaning this test can be used) for the duration of the COVID-19 declaration under Section 564(b)(1) of the Act, 21 U.S.C. section 360bbb-3(b)(1), unless the authorization is terminated or revoked sooner. Performed at [facility], [place] [address], [place], [place] [address]   Culture, blood (routine x 2)     Status: None (Preliminary result)   Collection Time: 06/25/19  2:30 PM   Specimen: BLOOD  Result Value Ref Range Status   Specimen Description BLOOD LEFT ANTECUBITAL  Final   Special Requests   Final    BOTTLES DRAWN AEROBIC AND ANAEROBIC Blood Culture adequate volume   Culture   Final    NO GROWTH 3 DAYS Performed at [facility], [place] [address] [place], [place] [address]    Report Status PENDING  Incomplete  Culture, blood (routine x 2)     Status: None (Preliminary result)   Collection Time: 06/25/19  3:00 PM   Specimen: BLOOD LEFT ARM  Result Value Ref Range Status   Specimen Description BLOOD LEFT ARM  Final   Special Requests   Final    BOTTLES DRAWN AEROBIC AND ANAEROBIC Blood Culture adequate volume   Culture   Final    NO GROWTH 3 DAYS Performed at [facility], [place] [address], [place], [place] [address]    Report Status PENDING  Incomplete  Surgical PCR screen     Status: None   Collection Time: 06/26/19  6:34 AM   Specimen: Nasal Mucosa; Nasal Swab  Result Value Ref Range Status   MRSA, PCR NEGATIVE NEGATIVE Final   Staphylococcus aureus NEGATIVE NEGATIVE Final    Comment: (NOTE) The Xpert SA Assay (FDA approved for NASAL  specimens in patients 38 years of age and older), is one component of a comprehensive surveillance program. It is not intended to diagnose infection nor to guide or monitor treatment. Performed at [facility], [place] [address], [place], [place] [address]          Radiology Studies: No results found.      Scheduled Meds: . [START ON 06/29/2019] allopurinol  100 mg Oral 3 times weekly  . finasteride  5 mg Oral Daily  . insulin aspart  0-5 Units Subcutaneous QHS  . insulin aspart  0-9 Units Subcutaneous TID WC  . lisinopril  5 mg Oral Daily  . metoprolol tartrate  25 mg Oral BID  . mupirocin ointment  1 application Nasal BID  . tamsulosin  0.4 mg Oral Daily  . vitamin B-12  1,000 mcg Oral Daily   Continuous Infusions: . ampicillin-sulbactam (UNASYN) IV 3 g (06/28/19 1237)     LOS: 4 days    Time spent: 40 minutes    Irine Seal, MD [facility]  If 7PM-7AM, please contact night-coverage [email or web address] 06/28/2019, 1:32 PM

## 2019-06-29 ENCOUNTER — Telehealth: Payer: Self-pay

## 2019-06-29 ENCOUNTER — Encounter: Payer: Self-pay | Admitting: Family Medicine

## 2019-06-29 DIAGNOSIS — K8309 Other cholangitis: Secondary | ICD-10-CM

## 2019-06-29 DIAGNOSIS — R160 Hepatomegaly, not elsewhere classified: Secondary | ICD-10-CM

## 2019-06-29 LAB — CBC WITH DIFFERENTIAL/PLATELET
Abs Immature Granulocytes: 0.06 10*3/uL (ref 0.00–0.07)
Basophils Absolute: 0 10*3/uL (ref 0.0–0.1)
Basophils Relative: 1 %
Eosinophils Absolute: 0.2 10*3/uL (ref 0.0–0.5)
Eosinophils Relative: 2 %
HCT: 38.3 % — ABNORMAL LOW (ref 39.0–52.0)
Hemoglobin: 12.9 g/dL — ABNORMAL LOW (ref 13.0–17.0)
Immature Granulocytes: 1 %
Lymphocytes Relative: 27 %
Lymphs Abs: 1.8 10*3/uL (ref 0.7–4.0)
MCH: 34.1 pg — ABNORMAL HIGH (ref 26.0–34.0)
MCHC: 33.7 g/dL (ref 30.0–36.0)
MCV: 101.3 fL — ABNORMAL HIGH (ref 80.0–100.0)
Monocytes Absolute: 0.8 10*3/uL (ref 0.1–1.0)
Monocytes Relative: 12 %
Neutro Abs: 3.8 10*3/uL (ref 1.7–7.7)
Neutrophils Relative %: 57 %
Platelets: 163 10*3/uL (ref 150–400)
RBC: 3.78 MIL/uL — ABNORMAL LOW (ref 4.22–5.81)
RDW: 14.4 % (ref 11.5–15.5)
WBC: 6.6 10*3/uL (ref 4.0–10.5)
nRBC: 0 % (ref 0.0–0.2)

## 2019-06-29 LAB — COMPREHENSIVE METABOLIC PANEL
ALT: 86 U/L — ABNORMAL HIGH (ref 0–44)
AST: 24 U/L (ref 15–41)
Albumin: 2.7 g/dL — ABNORMAL LOW (ref 3.5–5.0)
Alkaline Phosphatase: 141 U/L — ABNORMAL HIGH (ref 38–126)
Anion gap: 11 (ref 5–15)
BUN: 5 mg/dL — ABNORMAL LOW (ref 8–23)
CO2: 23 mmol/L (ref 22–32)
Calcium: 8.2 mg/dL — ABNORMAL LOW (ref 8.9–10.3)
Chloride: 108 mmol/L (ref 98–111)
Creatinine, Ser: 1.02 mg/dL (ref 0.61–1.24)
GFR calc Af Amer: >60 mL/min (ref >60)
GFR calc non Af Amer: >60 mL/min (ref >60)
Glucose, Bld: 78 mg/dL (ref 70–99)
Potassium: 3.5 mmol/L (ref 3.5–5.1)
Sodium: 142 mmol/L (ref 135–145)
Total Bilirubin: 0.9 mg/dL (ref 0.3–1.2)
Total Protein: 5.6 g/dL — ABNORMAL LOW (ref 6.5–8.1)

## 2019-06-29 LAB — GLUCOSE, CAPILLARY
Glucose-Capillary: 89 mg/dL (ref 70–99)
Glucose-Capillary: 85 mg/dL (ref 70–99)

## 2019-06-29 MED ORDER — POTASSIUM CHLORIDE CRYS ER 20 MEQ PO TBCR
40.0000 meq | EXTENDED_RELEASE_TABLET | Freq: Once | ORAL | Status: AC
  Administered 2019-06-29: 40 meq via ORAL
  Filled 2019-06-29: qty 2

## 2019-06-29 MED ORDER — AMOXICILLIN-POT CLAVULANATE 875-125 MG PO TABS: 1.0000 | ORAL_TABLET | Freq: Two times a day (BID) | ORAL | 0 refills | Status: AC

## 2019-06-29 MED FILL — AMOX-CLAV 875-125 MG TABLET: 875-125 | 5 days supply | Qty: 10 | Fill #0

## 2019-06-29 NOTE — Progress Notes (Signed)
[facility]  Date of Admission:  06/23/2019      Total days of antibiotics 7  Unasyn 4           ASSESSMENT: 84 y.o. 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 He has had improvement in his LFTs and has remained afebrile with normalized WBC count. Would treat with 5 more days of oral augmentin to give him 1 week treatment following ECRP intervention.   PLAN: 1. Transition to augmentin x 5 more days  Follow up with PCP/GI team outpatient for ongoing management.    Principal Problem:   Biliary sepsis Active Problems:   E coli bacteremia   Bacteremia due to Klebsiella pneumoniae   Essential hypertension   Diabetes mellitus, type 2 (HCC)   LFT elevation   Hypokalemia   Acute kidney injury ([place])   Dementia (HCC)   Cholangiocarcinoma (HCC)   Colitis   Hypomagnesemia   Hypophosphatemia   Acute cholangitis   Biliary stricture   . allopurinol  100 mg Oral 3 times weekly  . finasteride  5 mg Oral Daily  . insulin aspart  0-5 Units Subcutaneous QHS  . insulin aspart  0-9 Units Subcutaneous TID WC  . lisinopril  5 mg Oral Daily  . metoprolol tartrate  25 mg Oral BID  . mupirocin ointment  1 application Nasal BID  . tamsulosin  0.4 mg Oral Daily  . vitamin B-12  1,000 mcg Oral Daily    SUBJECTIVE: Afebrile o/n with resolution of leukocytosis. BCx drawn 8/06 no growth thusfar. No abdominal pain. No other concerns/complaints.  LFTs coming down.   Review of Systems: Review of Systems  All other systems reviewed and are negative.   Allergies  Allergen Reactions  . Sulfonamide Derivatives     Unknown, per pt and "cant stand it"    OBJECTIVE: Vitals:   06/29/19 0046 06/29/19 0502 06/29/19 0820 06/29/19 1130  BP: 131/66 (!) 149/68 (!) 143/71 (!) 143/70  Pulse: 61  (!) 54 (!) 57 (!) 59  Resp: 15 18 20 17   Temp: 97.6 F (36.4 C) 98 F (36.7 C) 98.1 F (36.7 C) 98.3 F (36.8 C)  TempSrc: Oral Oral Axillary Oral  SpO2: 93% 96% 95% 92%  Weight:       Body mass index is 27.51 kg/m.  Physical Exam Vitals signs reviewed.  Cardiovascular:     Rate and Rhythm: Normal rate.  Pulmonary:     Effort: Pulmonary effort is normal.  Abdominal:     Tenderness: There is no abdominal tenderness.  Skin:    General: Skin is warm and dry.     Capillary Refill: Capillary refill takes less than 2 seconds.  Neurological:     Mental Status: He is alert and oriented to person, place, and time.  Psychiatric:     Comments: Baseline dementia     Lab Results Lab Results  Component Value Date   WBC 6.6 06/29/2019   HGB 12.9 (L) 06/29/2019   HCT 38.3 (L) 06/29/2019   MCV 101.3 (H) 06/29/2019   PLT 163 06/29/2019    Lab Results  Component Value Date   CREATININE 1.02 06/29/2019   BUN 5 (L) 06/29/2019   NA 142 06/29/2019   K 3.5 06/29/2019   CL 108 06/29/2019   CO2 23 06/29/2019    Lab Results  Component Value Date   ALT 86 (H) 06/29/2019   AST 24 06/29/2019   ALKPHOS 141 (H) 06/29/2019   BILITOT 0.9 06/29/2019     Microbiology: Recent Results (from the past 240 hour(s))  Blood culture (routine x 2)     Status: Abnormal   Collection Time: 06/23/19  8:58 PM   Specimen: BLOOD LEFT FOREARM  Result Value Ref Range Status   Specimen Description BLOOD LEFT FOREARM  Final   Special Requests   Final    BOTTLES DRAWN AEROBIC AND ANAEROBIC Blood Culture adequate volume   Culture  Setup Time   Final    ANAEROBIC BOTTLE ONLY GRAM NEGATIVE RODS CRITICAL VALUE NOTED.  VALUE IS CONSISTENT WITH PREVIOUSLY REPORTED AND CALLED VALUE. Performed at [facility], [place] [address] [place], [place] [address]    Culture KLEBSIELLA PNEUMONIAE (A)  Final   Report Status 06/26/2019 FINAL  Final   Organism ID, Bacteria KLEBSIELLA PNEUMONIAE  Final       Susceptibility   Klebsiella pneumoniae - MIC*    AMPICILLIN RESISTANT Resistant     CEFAZOLIN <=4 SENSITIVE Sensitive     CEFEPIME <=1 SENSITIVE Sensitive     CEFTAZIDIME <=1 SENSITIVE Sensitive     CEFTRIAXONE <=1 SENSITIVE Sensitive     CIPROFLOXACIN <=0.25 SENSITIVE Sensitive     GENTAMICIN <=1 SENSITIVE Sensitive     IMIPENEM <=0.25 SENSITIVE Sensitive     TRIMETH/SULFA <=20 SENSITIVE Sensitive     AMPICILLIN/SULBACTAM 4 SENSITIVE Sensitive     PIP/TAZO <=4 SENSITIVE Sensitive     Extended ESBL NEGATIVE Sensitive     * KLEBSIELLA PNEUMONIAE  Blood culture (routine x 2)     Status: Abnormal   Collection Time: 06/23/19  9:03 PM   Specimen: BLOOD  Result Value Ref Range Status   Specimen Description BLOOD RIGHT ANTECUBITAL  Final   Special Requests   Final    BOTTLES DRAWN AEROBIC AND ANAEROBIC Blood Culture adequate volume   Culture  Setup Time   Final    GRAM NEGATIVE RODS ANAEROBIC BOTTLE ONLY CRITICAL RESULT CALLED TO, READ BACK BY AND VERIFIED WITH: Reece Packer PharmD 17:10 06/24/19 ([email or web address]) AEROBIC BOTTLE ONLY GRAM POSITIVE COCCI CRITICAL RESULT CALLED TO, READ BACK BY AND VERIFIED WITH: L SEAY PHARMD 06/24/19 2342 JDW    Culture (A)  Final    ESCHERICHIA COLI STAPHYLOCOCCUS SPECIES (COAGULASE NEGATIVE) THE SIGNIFICANCE OF ISOLATING THIS ORGANISM FROM A SINGLE SET OF BLOOD CULTURES WHEN MULTIPLE SETS ARE DRAWN IS UNCERTAIN. PLEASE NOTIFY THE MICROBIOLOGY DEPARTMENT WITHIN ONE WEEK IF SPECIATION AND SENSITIVITIES ARE REQUIRED. Performed at [facility], [place] [address], [place], [place] [address]    Report Status 06/26/2019 FINAL  Final   Organism ID, Bacteria ESCHERICHIA COLI  Final      Susceptibility   Escherichia coli - MIC*    AMPICILLIN 4 SENSITIVE Sensitive     CEFAZOLIN <=4 SENSITIVE Sensitive     CEFEPIME <=1 SENSITIVE Sensitive     CEFTAZIDIME <=1 SENSITIVE Sensitive     CEFTRIAXONE <=1 SENSITIVE Sensitive     CIPROFLOXACIN 1 SENSITIVE Sensitive      GENTAMICIN <=1 SENSITIVE Sensitive     IMIPENEM <=0.25 SENSITIVE Sensitive     TRIMETH/SULFA <=20 SENSITIVE Sensitive     AMPICILLIN/SULBACTAM <=2 SENSITIVE Sensitive     PIP/TAZO <=4 SENSITIVE Sensitive     Extended ESBL NEGATIVE Sensitive     * ESCHERICHIA COLI  Blood Culture ID Panel (Reflexed)  Status: Abnormal   Collection Time: 06/23/19  9:03 PM  Result Value Ref Range Status   Enterococcus species NOT DETECTED NOT DETECTED Final   Listeria monocytogenes NOT DETECTED NOT DETECTED Final   Staphylococcus species NOT DETECTED NOT DETECTED Final   Staphylococcus aureus (BCID) NOT DETECTED NOT DETECTED Final   Streptococcus species NOT DETECTED NOT DETECTED Final   Streptococcus agalactiae NOT DETECTED NOT DETECTED Final   Streptococcus pneumoniae NOT DETECTED NOT DETECTED Final   Streptococcus pyogenes NOT DETECTED NOT DETECTED Final   Acinetobacter baumannii NOT DETECTED NOT DETECTED Final   Enterobacteriaceae species DETECTED (A) NOT DETECTED Final    Comment: Enterobacteriaceae represent a large family of gram-negative bacteria, not a single organism. CRITICAL RESULT CALLED TO, READ BACK BY AND VERIFIED WITH: Reece Packer PharmD 17:10 06/24/19 ([email or web address])    Enterobacter cloacae complex NOT DETECTED NOT DETECTED Final   Escherichia coli DETECTED (A) NOT DETECTED Final    Comment: CRITICAL RESULT CALLED TO, READ BACK BY AND VERIFIED WITH: Reece Packer PharmD 17:10 06/24/19 ([email or web address])    Klebsiella oxytoca NOT DETECTED NOT DETECTED Final   Klebsiella pneumoniae NOT DETECTED NOT DETECTED Final   Proteus species NOT DETECTED NOT DETECTED Final   Serratia marcescens NOT DETECTED NOT DETECTED Final   Carbapenem resistance NOT DETECTED NOT DETECTED Final   Haemophilus influenzae NOT DETECTED NOT DETECTED Final   Neisseria meningitidis NOT DETECTED NOT DETECTED Final   Pseudomonas aeruginosa NOT DETECTED NOT DETECTED Final   Candida albicans NOT DETECTED NOT DETECTED Final   Candida glabrata  NOT DETECTED NOT DETECTED Final   Candida krusei NOT DETECTED NOT DETECTED Final   Candida parapsilosis NOT DETECTED NOT DETECTED Final   Candida tropicalis NOT DETECTED NOT DETECTED Final    Comment: Performed at [facility], [place] [address] [place], [place] [address]  Blood Culture ID Panel (Reflexed)     Status: Abnormal   Collection Time: 06/23/19  9:03 PM  Result Value Ref Range Status   Enterococcus species NOT DETECTED NOT DETECTED Final   Listeria monocytogenes NOT DETECTED NOT DETECTED Final   Staphylococcus species DETECTED (A) NOT DETECTED Final    Comment: Methicillin (oxacillin) susceptible coagulase negative staphylococcus. Possible blood culture contaminant (unless isolated from more than one blood culture draw or clinical case suggests pathogenicity). No antibiotic treatment is indicated for blood  culture contaminants. CRITICAL RESULT CALLED TO, READ BACK BY AND VERIFIED WITH: L SEAY PHARMD 06/24/19 2342 JDW    Staphylococcus aureus (BCID) NOT DETECTED NOT DETECTED Final   Methicillin resistance NOT DETECTED NOT DETECTED Final   Streptococcus species NOT DETECTED NOT DETECTED Final   Streptococcus agalactiae NOT DETECTED NOT DETECTED Final   Streptococcus pneumoniae NOT DETECTED NOT DETECTED Final   Streptococcus pyogenes NOT DETECTED NOT DETECTED Final   Acinetobacter baumannii NOT DETECTED NOT DETECTED Final   Enterobacteriaceae species NOT DETECTED NOT DETECTED Final   Enterobacter cloacae complex NOT DETECTED NOT DETECTED Final   Escherichia coli NOT DETECTED NOT DETECTED Final   Klebsiella oxytoca NOT DETECTED NOT DETECTED Final   Klebsiella pneumoniae NOT DETECTED NOT DETECTED Final   Proteus species NOT DETECTED NOT DETECTED Final   Serratia marcescens NOT DETECTED NOT DETECTED Final   Haemophilus influenzae NOT DETECTED NOT DETECTED Final   Neisseria meningitidis NOT DETECTED NOT DETECTED Final   Pseudomonas aeruginosa NOT DETECTED NOT DETECTED Final    Candida albicans NOT DETECTED NOT DETECTED Final   Candida glabrata NOT DETECTED NOT DETECTED Final   Candida  krusei NOT DETECTED NOT DETECTED Final   Candida parapsilosis NOT DETECTED NOT DETECTED Final   Candida tropicalis NOT DETECTED NOT DETECTED Final    Comment: Performed at [facility], [place] [address] [place], [place] [address]  SARS Coronavirus 2 [facility] order, Performed in [facility] hospital lab) Nasopharyngeal Nasopharyngeal Swab     Status: None   Collection Time: 06/23/19  9:56 PM   Specimen: Nasopharyngeal Swab  Result Value Ref Range Status   SARS Coronavirus 2 NEGATIVE NEGATIVE Final    Comment: (NOTE) If result is NEGATIVE SARS-CoV-2 target nucleic acids are NOT DETECTED. The SARS-CoV-2 RNA is generally detectable in upper and lower  respiratory specimens during the acute phase of infection. The lowest  concentration of SARS-CoV-2 viral copies this assay can detect is 250  copies / mL. A negative result does not preclude SARS-CoV-2 infection  and should not be used as the sole basis for treatment or other  patient management decisions.  A negative result may occur with  improper specimen collection / handling, submission of specimen other  than nasopharyngeal swab, presence of viral mutation(s) within the  areas targeted by this assay, and inadequate number of viral copies  (<250 copies / mL). A negative result must be combined with clinical  observations, patient history, and epidemiological information. If result is POSITIVE SARS-CoV-2 target nucleic acids are DETECTED. The SARS-CoV-2 RNA is generally detectable in upper and lower  respiratory specimens dur ing the acute phase of infection.  Positive  results are indicative of active infection with SARS-CoV-2.  Clinical  correlation with patient history and other diagnostic information is  necessary to determine patient infection status.  Positive results do  not rule out bacterial infection or  co-infection with other viruses. If result is PRESUMPTIVE POSTIVE SARS-CoV-2 nucleic acids MAY BE PRESENT.   A presumptive positive result was obtained on the submitted specimen  and confirmed on repeat testing.  While 2019 novel coronavirus  (SARS-CoV-2) nucleic acids may be present in the submitted sample  additional confirmatory testing may be necessary for epidemiological  and / or clinical management purposes  to differentiate between  SARS-CoV-2 and other Sarbecovirus currently known to infect humans.  If clinically indicated additional testing with an alternate test  methodology [phone]) is advised. The SARS-CoV-2 RNA is generally  detectable in upper and lower respiratory sp ecimens during the acute  phase of infection. The expected result is Negative. Fact Sheet for Patients:  StrictlyIdeas.no Fact Sheet for Healthcare Providers: BankingDealers.co.za This test is not yet approved or cleared by the [place] FDA and has been authorized for detection and/or diagnosis of SARS-CoV-2 by FDA under an Emergency Use Authorization (EUA).  This EUA will remain in effect (meaning this test can be used) for the duration of the COVID-19 declaration under Section 564(b)(1) of the Act, 21 U.S.C. section 360bbb-3(b)(1), unless the authorization is terminated or revoked sooner. Performed at [facility], [place] [address], [place], [place] [address]   Culture, blood (routine x 2)     Status: None (Preliminary result)   Collection Time: 06/25/19  2:30 PM   Specimen: BLOOD  Result Value Ref Range Status   Specimen Description BLOOD LEFT ANTECUBITAL  Final   Special Requests   Final    BOTTLES DRAWN AEROBIC AND ANAEROBIC Blood Culture adequate volume   Culture   Final    NO GROWTH 4 DAYS Performed at [facility], [place] [address] [place], [place] [address]  Report Status PENDING  Incomplete  Culture, blood (routine x 2)      Status: None (Preliminary result)   Collection Time: 06/25/19  3:00 PM   Specimen: BLOOD LEFT ARM  Result Value Ref Range Status   Specimen Description BLOOD LEFT ARM  Final   Special Requests   Final    BOTTLES DRAWN AEROBIC AND ANAEROBIC Blood Culture adequate volume   Culture   Final    NO GROWTH 4 DAYS Performed at [facility], [address], [place], [place] [address]    Report Status PENDING  Incomplete  Surgical PCR screen     Status: None   Collection Time: 06/26/19  6:34 AM   Specimen: Nasal Mucosa; Nasal Swab  Result Value Ref Range Status   MRSA, PCR NEGATIVE NEGATIVE Final   Staphylococcus aureus NEGATIVE NEGATIVE Final    Comment: (NOTE) The Xpert SA Assay (FDA approved for NASAL specimens in patients 23 years of age and older), is one component of a comprehensive surveillance program. It is not intended to diagnose infection nor to guide or monitor treatment. Performed at [facility], [place] [address], [place], [place] [address]      Janene Madeira, MSN, NP-C [facility] [email or web address] Pager: [phone] Office: [phone] [place]: [phone]

## 2019-06-29 NOTE — Telephone Encounter (Signed)
Gabe, Would appreciate if you could go ERCP with spyglass. Hopefully, it would be neg.  Presentation was more c/w with stones.  Thanks a lot  RG

## 2019-06-29 NOTE — TOC Transition Note (Signed)
Transition of Care [facility]) - CM/SW Discharge Note   Patient Details  Name: Ricardo [facility] MRN: [id number] Date of Birth: 08-Nov-1935  [facility] CM/SW Contact:  Pollie Friar, RN Phone Number: 06/29/2019, 12:16 PM   Clinical Narrative:    Pt discharging home today. CM notified Butch Penny with [facility] that pt leaving today. Butch Penny had accepted the referral on Friday.  Walker to be delivered to the room. Pts son is at the bedside and will provide transportation home.    Final next level of care: Home w Home Health Services Barriers to Discharge: No Barriers Identified   Patient Goals and CMS Choice   CMS Medicare.gov Compare Post Acute Care list provided to:: Patient Represenative (must comment) Choice offered to / list presented to : Spouse  Discharge Placement                       Discharge Plan and Services                DME Arranged: Walker rolling DME Agency: [employer] Date DME Agency Contacted: 06/29/19   Representative spoke with at DME Agency: [facility]ack HH Arranged: PT, OT [place] Agency: [place] ([place]) Date [place]: 06/29/19   Representative spoke with at [place]: Butch Penny made aware of d/c home today.  Social Determinants of Health (SDOH) Interventions     Readmission Risk Interventions No flowsheet data found.

## 2019-06-29 NOTE — Telephone Encounter (Signed)
Ricardo [facility], Thank you for the reply. Liver tests look to be coming down even though he still remains in the hospital. Let see what the tissue and the brushings show and decide follow-up needs for spyglass. Thank you. Patty, we will schedule the follow-up ERCP based on the pathology when it returns. GM

## 2019-06-29 NOTE — Plan of Care (Signed)
Adequate for discharge.

## 2019-06-29 NOTE — Care Management Important Message (Signed)
Important Message  Patient Details  Name: Ricardo [facility] MRN: [id number] Date of Birth: 04-23-1935   Medicare Important Message Given:  Yes     Orbie Pyo 06/29/2019, 3:28 PM

## 2019-06-29 NOTE — Discharge Summary (Signed)
Physician Discharge Summary  NYEEM STOKE ZDG:[id number] DOB: Jul 15, 1935 DOA: 06/23/2019  PCP: [name], MD  Admit date: 06/23/2019 Discharge date: 06/29/2019  Time spent: 45 minutes  Recommendations for Outpatient Follow-up:  1. Follow up with dr [name] 3-4 weeks. Need hepatic panel in 2 weeks    Discharge Diagnoses:  Principal Problem:   Biliary sepsis Active Problems:   Acute kidney injury (HCC)   Cholangiocarcinoma (HCC)   Colitis   Acute cholangitis   Biliary stricture   E coli bacteremia   Essential hypertension   Diabetes mellitus, type 2 ([place])   LFT elevation   Hypokalemia   Dementia (HCC)   Hypomagnesemia   Hypophosphatemia   Bacteremia due to Klebsiella pneumoniae   Discharge Condition: stable  Diet recommendation: carb modified  Filed Weights   06/24/19 0045 06/24/19 0141  Weight: 77.4 kg 75 kg    History of present illness:  HPI per Dr. Marthenia Rolling 06/23/19 Patient is an 84 year old Caucasian male unable to provide any significant history due to dementing illness. Patient could not or not tell me why he came to the hospital. 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 Patient is said to have presented with back and abdominal pain with abnormal LFTs. Bilirubin is only 2, AST is 1061 and ALT is 463 with alkaline phosphatase of 256 (up from 105). CT Angioof the chest, abdomen and pelvis CT revealed "No evidence of aneurysmal dilatation or dissection within thethoracic and abdominal aorta.  Stable segmental biliary dilatation within segment 8 of the liver stable from the prior exam. Mild decreased attenuation is noted centrally similar to that seen on prior MRI. No significant enlargement is noted. Wall thickening  within the rectosigmoid consistent with focal colitis. No abscess or perforation is noted. Chronic changes as described above".According to Dr. Gilford Raid, ER provider, she discussed above findings with the GI doctor, Dr. [name], and Dr. [name] has asked the hospitalist team to admit patient for possible impending sepsis based on prior clinical presentation.  ED Course:On presentation to the ER, patient was afebrile, blood pressure of 84-1 171/90 125 mmHg, heart rate of 77 to 103 bpm and respiratory rate of 18 and O2 sat of 95%. Patient has been cultured. IV cefepime have been given. CT scan is as above. No leukocytosis.  Pertinent labs:Chemistry reveals sodium of 139, potassium of 3.7, chloride 107, CO2 of 19, BUN of 15, creatinine of 1.17 with a blood sugar of 162. Alkaline phosphatase is 256, albumin is 3.4, lipase is 22, AST is 1061, ALT is 463, total protein is 6.1, direct bilirubin is 1.4, total bilirubin is 2. CBC reveals WBC of 7.6, hemoglobin 14.4, hematocrit of 40.8, MCV of 103.6, platelet count of 135.   Hospital Course:  1. Biliary sepsis/E. coli and Klebsiella bacteremia/cholangitis Patient afebrile, blood pressure, with no tachycardia or tachypnea. Patient with leukocytosis resolved.Blood cultures positive for E. coli and Klebsiella. Blood cultures with also staph aureus likely contaminant. Transaminitis continues to trend down. Acute hepatitis panel negative. Patient had MRCP evening 06/24/2019. Patient being followed by GI and underwent ERCP on 06/26/2019 which showed indeterminate stricture in the right hepatic duct with brushings taken, choledocholithiasis, biliary sludge, hemobilia which was removed with balloon sweep following biliary sphincterotomy and balloon sphincterotome plasty.  Tissues samples obtained from main biliary tree  currently pending.  10 French 12 cm plastic stent placed to the right hepatic duct.  J-shaped gastric deformity noted, duodenal diverticulum.  Received  IV Unasyn.  Repeat blood cultures with no growth to date.  GI and ID following and appreciate input and recommendations.   2.Electrolyte abnormality/hypomagnesemia/hypophosphatemia Repleted.  3. Dementia Stable.  4. Hypertension Blood pressure high end of normal. Resume home regimen  5.  Acute kidney injury Resolved at discharge.   6.  Diabetes mellitus type 2 Hemoglobin A1c was 6.1 in July 2020.  resume oral agents   Procedures:  CT angiogram chest abdomen and pelvis 06/23/2019  ERCP with stent placement per Dr. Rush Landmark 06/26/2019  MRCP 06/24/2019   Consultations:  Gastroenterology: Dr. Hilarie Fredrickson 06/24/2019  Infectious disease  Discharge Exam: Vitals:   06/29/19 0820 06/29/19 1130  BP: (!) 143/71 (!) 143/70  Pulse: (!) 57 (!) 59  Resp: 20 17  Temp: 98.1 F (36.7 C) 98.3 F (36.8 C)  SpO2: 95% 92%    General: awake alert eating lunch Cardiovascular: rrr no mgr no LE edema Respiratory: normal effort BS clear bilaterally no wheeze  Discharge Instructions   Discharge Instructions    Call MD for:  temperature >100.4   Complete by: As directed    Diet - low sodium heart healthy   Complete by: As directed    Discharge instructions   Complete by: As directed    Take medications as prescribed Follow up with Dr Steve Rattler office 2 weeks. Will need labs (hepatic panel) at that time and an appointment when labs are back.   Increase activity slowly   Complete by: As directed      Allergies as of 06/29/2019      Reactions   Sulfonamide Derivatives    Unknown, per pt and "cant stand it"      Medication List    TAKE these medications   allopurinol 100 MG tablet Commonly known as: ZYLOPRIM TAKE ONE TABLET BY MOUTH ON MONDAY, WEDNESDAY, AND FRIDAY What changed: See the new instructions.   ALPRAZolam 0.25 MG dissolvable tablet Commonly known as: NIRAVAM Take 0.25 mg by mouth at bedtime as needed for anxiety.   amoxicillin-clavulanate 875-125 MG  tablet Commonly known as: Augmentin Take 1 tablet by mouth 2 (two) times daily for 5 days.   aspirin 325 MG tablet Take 325 mg by mouth daily.   betamethasone dipropionate 0.05 % cream Commonly known as: DIPROLENE Apply 1 application topically daily.   ferrous sulfate 325 (65 FE) MG EC tablet Take 325 mg by mouth daily with breakfast.   finasteride 5 MG tablet Commonly known as: PROSCAR TAKE ONE TABLET BY MOUTH EVERY DAY   lisinopril 5 MG tablet Commonly known as: ZESTRIL TAKE ONE TABLET BY MOUTH DAILY   metFORMIN 500 MG 24 hr tablet Commonly known as: GLUCOPHAGE-XR TAKE ONE TABLET BY MOUTH TWICE DAILY WITH MEALS   metoprolol tartrate 25 MG tablet Commonly known as: LOPRESSOR TAKE ONE TABLET BY MOUTH TWICE DAILY   MULTIPLE VITAMIN PO Take 1 tablet by mouth daily.   nitroGLYCERIN 0.4 MG SL tablet Commonly known as: NITROSTAT Place 1 tablet (0.4 mg total) under the tongue every 5 (five) minutes as needed for chest pain.   omeprazole 40 MG capsule Commonly known as: PRILOSEC Take 1 capsule (40 mg total) by mouth daily.   onetouch ultrasoft lancets Use to check blood sugar once daily   PreviDent 5000 Plus 1.1 % Crea dental cream Generic drug: sodium fluoride Place 1 application  onto teeth at bedtime.   rosuvastatin 10 MG tablet Commonly known as: CRESTOR TAKE ONE TABLET BY MOUTH EVERY DAY   tamsulosin 0.4 MG Caps capsule Commonly known as: FLOMAX TAKE ONE CAPSULE BY MOUTH DAILY   Trospium Chloride 60 MG Cp24 TAKE ONE CAPSULE BY MOUTH DAILY   vitamin B-12 1000 MCG tablet Commonly known as: CYANOCOBALAMIN Take 1,000 mcg by mouth daily.            Durable Medical Equipment  (From admission, onward)         Start     Ordered   06/29/19 1203  For home use only DME Walker rolling  Once    Question:  Patient needs a walker to treat with the following condition  Answer:  Sepsis ([place])   06/29/19 1202         Allergies  Allergen Reactions  .  Sulfonamide Derivatives     Unknown, per pt and "cant stand it"      The results of significant diagnostics from this hospitalization (including imaging, microbiology, ancillary and laboratory) are listed below for reference.    Significant Diagnostic Studies: Mr 3d Recon At Scanner  Result Date: 06/25/2019 CLINICAL DATA:  Abn liver function tests (LFTs) Abd pain, extrahepatic cholangiocarcinoma suspected42m of gadavist. 84year old male with presumed cholangiocarcinoma, having been monitored by imaging over the last year. Now presenting with abdominal pain, significantly elevated liver enzymes and leukocytosis. No fever. EXAM: MRI ABDOMEN WITHOUT AND WITH CONTRAST (INCLUDING MRCP) TECHNIQUE: Multiplanar multisequence MR imaging of the abdomen was performed both before and after the administration of intravenous contrast. Heavily T2-weighted images of the biliary and pancreatic ducts were obtained, and three-dimensional MRCP images were rendered by post processing. CONTRAST:  Seven mL Gadavist COMPARISON:  CT 06/23/2019, MRI 05/03/2018, PET-CT 05/18/2019 FINDINGS: Lower chest:  Small bilateral pleural effusions. Hepatobiliary: There is mild duct dilatation within the RIGHT hepatic lobe (segment 8/6) which is similar to comparison MRI 05/03/2018 (image 19/1)7. There is minimal contrast enhancement through this region of obstruction. Lesion is better identified on the comparison FDG PET scan. The MRCP sequence do demonstrate interruption of the ducts in the same segment. The distal common bile duct is normal caliber. No new lesions identified. Pancreas: Normal pancreatic parenchymal intensity. No ductal dilatation or inflammation. Spleen: Normal spleen. Adrenals/urinary tract: Adrenal glands and kidneys are normal. Stomach/Bowel: Stomach and limited of the small bowel is unremarkable Vascular/Lymphatic: Abdominal aortic normal caliber. No retroperitoneal periportal lymphadenopathy. Musculoskeletal: No  aggressive osseous lesion IMPRESSION: 1. Persistent duct dilatation in segment 8/6 of the RIGHT hepatic lobe with suspicion of region central obstruction identified by hypermetabolic tissue on comparison FDG PET scan. The MRI findings are very similar to MRI of 05/03/2018. The tumor is very poorly demonstrated on current exam. There is some limitation to the imaging due to patient body motion. 2. Normal distal common bile duct.  Normal pancreas. Electronically Signed   By: SSuzy BouchardM.D.   On: 06/25/2019 07:57   Dg Chest Port 1 View  Result Date: 06/23/2019 CLINICAL DATA:  Chest pain, back pain, constipation for 1 day EXAM: PORTABLE CHEST 1 VIEW COMPARISON:  Radiograph May 05, 2011 FINDINGS: Postsurgical changes related to prior CABG including intact and aligned sternotomy wires and multiple surgical clips projecting over the mediastinum. Cardiac silhouette is borderline enlarged linear opacities in the right lung base likely reflect subsegmental atelectasis no consolidation, features of edema, pneumothorax, or effusion. Pulmonary vascularity is normally distributed. No acute osseous or soft tissue  abnormality. Degenerative changes are present in the and imaged spine and shoulders. Cardiac leads overlie the chest. IMPRESSION: No acute cardiopulmonary abnormality Electronically Signed   By: Lovena Le M.D.   On: 06/23/2019 17:04   Dg Ercp Biliary & Pancreatic Ducts  Result Date: 06/26/2019 CLINICAL DATA:  Obstructive jaundice. ERCP with biliary stent placement. EXAM: ERCP TECHNIQUE: Multiple spot images obtained with the fluoroscopic device and submitted for interpretation post-procedure. COMPARISON:  CT the chest, abdomen pelvis-06/23/2019; MRCP-06/24/2019 FINDINGS: 13 spot intraoperative fluoroscopic images of the right upper abdominal quadrant during ERCP are provided for review Initial image demonstrates an ERCP probe overlying the right upper abdominal quadrant. Subsequent images demonstrate  selective cannulation opacification of the common bile duct. Subsequent images demonstrate insufflation of a balloon within the central aspect of the CBD with subsequent biliary sweeping and presumed sphincterotomy with additional images demonstrate biliary plasty at the level of the ampulla (image labeled 11). There is minimal opacification of the intrahepatic biliary tree which appears mildly dilated. There is minimal opacification of the cystic duct with minimal passage of contrast into the gallbladder lumen. There is no definitive opacification of the pancreatic duct. Completion image demonstrates placement of an internal plastic biliary stent. IMPRESSION: ERCP with biliary sweeping, sphincterotomy / biliary plasty and internal biliary stent placement as above. These images were submitted for radiologic interpretation only. Please see the procedural report for the amount of contrast and the fluoroscopy time utilized. Electronically Signed   By: Sandi Mariscal M.D.   On: 06/26/2019 13:13   Mr Abdomen Mrcp Moise Boring Contast  Result Date: 06/25/2019 CLINICAL DATA:  Abn liver function tests (LFTs) Abd pain, extrahepatic cholangiocarcinoma suspected27m of gadavist. 84year old male with presumed cholangiocarcinoma, having been monitored by imaging over the last year. Now presenting with abdominal pain, significantly elevated liver enzymes and leukocytosis. No fever. EXAM: MRI ABDOMEN WITHOUT AND WITH CONTRAST (INCLUDING MRCP) TECHNIQUE: Multiplanar multisequence MR imaging of the abdomen was performed both before and after the administration of intravenous contrast. Heavily T2-weighted images of the biliary and pancreatic ducts were obtained, and three-dimensional MRCP images were rendered by post processing. CONTRAST:  Seven mL Gadavist COMPARISON:  CT 06/23/2019, MRI 05/03/2018, PET-CT 05/18/2019 FINDINGS: Lower chest:  Small bilateral pleural effusions. Hepatobiliary: There is mild duct dilatation within the RIGHT  hepatic lobe (segment 8/6) which is similar to comparison MRI 05/03/2018 (image 19/1)7. There is minimal contrast enhancement through this region of obstruction. Lesion is better identified on the comparison FDG PET scan. The MRCP sequence do demonstrate interruption of the ducts in the same segment. The distal common bile duct is normal caliber. No new lesions identified. Pancreas: Normal pancreatic parenchymal intensity. No ductal dilatation or inflammation. Spleen: Normal spleen. Adrenals/urinary tract: Adrenal glands and kidneys are normal. Stomach/Bowel: Stomach and limited of the small bowel is unremarkable Vascular/Lymphatic: Abdominal aortic normal caliber. No retroperitoneal periportal lymphadenopathy. Musculoskeletal: No aggressive osseous lesion IMPRESSION: 1. Persistent duct dilatation in segment 8/6 of the RIGHT hepatic lobe with suspicion of region central obstruction identified by hypermetabolic tissue on comparison FDG PET scan. The MRI findings are very similar to MRI of 05/03/2018. The tumor is very poorly demonstrated on current exam. There is some limitation to the imaging due to patient body motion. 2. Normal distal common bile duct.  Normal pancreas. Electronically Signed   By: SSuzy BouchardM.D.   On: 06/25/2019 07:57   Ct Angio Chest/abd/pel For Dissection W And/or Wo Contrast  Result Date: 06/23/2019 CLINICAL DATA:  Chest and  back pain for 1 day, no known injury, initial encounter EXAM: CT ANGIOGRAPHY CHEST, ABDOMEN AND PELVIS TECHNIQUE: Multidetector CT imaging through the chest, abdomen and pelvis was performed using the standard protocol during bolus administration of intravenous contrast. Multiplanar reconstructed images and MIPs were obtained and reviewed to evaluate the vascular anatomy. CONTRAST:  146m OMNIPAQUE IOHEXOL 350 MG/ML SOLN COMPARISON:  None. FINDINGS: CTA CHEST FINDINGS Cardiovascular: Thoracic aorta demonstrates bovine branching anatomy. Mild atherosclerotic  changes are seen without aneurysmal dilatation or dissection. Changes of prior coronary bypass grafting are seen. No cardiac enlargement is noted. Pulmonary artery is prominent and shows no definitive central pulmonary embolus although timing was not performed for embolus evaluation. Coronary calcifications are noted. No pericardial effusion is noted. Mediastinum/Nodes: Thoracic inlet is within normal limits. No hilar or mediastinal adenopathy is noted. No mediastinal hematoma is seen. The esophagus as visualized is within normal limits. Lungs/Pleura: The lungs are well aerated bilaterally. No focal infiltrate or sizable effusion is seen. Mild bibasilar atelectatic changes are noted. No sizable parenchymal nodules are seen. Musculoskeletal: Degenerative changes of the thoracic spine are noted. No acute rib abnormality is seen. Prior median sternotomy is noted. Review of the MIP images confirms the above findings. CTA ABDOMEN AND PELVIS FINDINGS VASCULAR Aorta: The abdominal aorta demonstrates atherosclerotic calcifications without aneurysmal dilatation or focal dissection. No extravasation is seen. Celiac: Calcifications are noted at the origin of the celiac axis although no focal stenosis is noted. SMA: Calcifications are noted at the origin of the superior mesenteric artery although no focal stenosis is seen. Renals: Single renal arteries are identified bilaterally with mild narrowing at the origins. IMA: Patent without evidence of aneurysm, dissection, vasculitis or significant stenosis. Iliacs: Iliacs are widely patent without aneurysmal dilatation or dissection. Veins: No vein abnormality is noted. Review of the MIP images confirms the above findings. NON-VASCULAR Hepatobiliary: Liver demonstrates changes of mild fatty infiltration. Mild segmental biliary dilatation is again noted similar to that seen on the prior exam. The previously seen central hypodense lesion is again identified but stable. No significant  increase in biliary dilatation is noted. The gallbladder is unremarkable. Pancreas: Unremarkable. No pancreatic ductal dilatation or surrounding inflammatory changes. Spleen: Normal in size without focal abnormality. Adrenals/Urinary Tract: Adrenal glands are within normal limits. Kidneys demonstrate a normal enhancement pattern bilaterally. No obstructive changes are seen. The bladder is well distended. Stomach/Bowel: Diverticular change of the colon is noted. Diffuse wall thickening is noted within the rectosigmoid region consistent with focal colitis. No perforation or abscess is noted. No significant pericolonic inflammatory changes are seen. The appendix is well visualized and within normal limits. No small bowel or gastric abnormality is noted. Lymphatic: No significant lymphadenopathy is noted. Reproductive: Prostate is unremarkable. Other: No abdominal wall hernia or abnormality. No abdominopelvic ascites. Musculoskeletal: Degenerative changes of lumbar spine are noted. No compression deformity is seen. Review of the MIP images confirms the above findings. IMPRESSION: No evidence of aneurysmal dilatation or dissection within the thoracic and abdominal aorta. Stable segmental biliary dilatation within segment 8 of the liver stable from the prior exam. Mild decreased attenuation is noted centrally similar to that seen on prior MRI. No significant enlargement is noted. Wall thickening within the rectosigmoid consistent with focal colitis. No abscess or perforation is noted. Chronic changes as described above. Electronically Signed   By: MInez CatalinaM.D.   On: 06/23/2019 19:54    Microbiology: Recent Results (from the past 240 hour(s))  Blood culture (routine x 2)  Status: Abnormal   Collection Time: 06/23/19  8:58 PM   Specimen: BLOOD LEFT FOREARM  Result Value Ref Range Status   Specimen Description BLOOD LEFT FOREARM  Final   Special Requests   Final    BOTTLES DRAWN AEROBIC AND ANAEROBIC Blood  Culture adequate volume   Culture  Setup Time   Final    ANAEROBIC BOTTLE ONLY GRAM NEGATIVE RODS CRITICAL VALUE NOTED.  VALUE IS CONSISTENT WITH PREVIOUSLY REPORTED AND CALLED VALUE. Performed at [facility], [place] [address] [place], [place] [address]    Culture KLEBSIELLA PNEUMONIAE (A)  Final   Report Status 06/26/2019 FINAL  Final   Organism ID, Bacteria KLEBSIELLA PNEUMONIAE  Final      Susceptibility   Klebsiella pneumoniae - MIC*    AMPICILLIN RESISTANT Resistant     CEFAZOLIN <=4 SENSITIVE Sensitive     CEFEPIME <=1 SENSITIVE Sensitive     CEFTAZIDIME <=1 SENSITIVE Sensitive     CEFTRIAXONE <=1 SENSITIVE Sensitive     CIPROFLOXACIN <=0.25 SENSITIVE Sensitive     GENTAMICIN <=1 SENSITIVE Sensitive     IMIPENEM <=0.25 SENSITIVE Sensitive     TRIMETH/SULFA <=20 SENSITIVE Sensitive     AMPICILLIN/SULBACTAM 4 SENSITIVE Sensitive     PIP/TAZO <=4 SENSITIVE Sensitive     Extended ESBL NEGATIVE Sensitive     * KLEBSIELLA PNEUMONIAE  Blood culture (routine x 2)     Status: Abnormal   Collection Time: 06/23/19  9:03 PM   Specimen: BLOOD  Result Value Ref Range Status   Specimen Description BLOOD RIGHT ANTECUBITAL  Final   Special Requests   Final    BOTTLES DRAWN AEROBIC AND ANAEROBIC Blood Culture adequate volume   Culture  Setup Time   Final    GRAM NEGATIVE RODS ANAEROBIC BOTTLE ONLY CRITICAL RESULT CALLED TO, READ BACK BY AND VERIFIED WITH: Reece Packer PharmD 17:10 06/24/19 ([email or web address]) AEROBIC BOTTLE ONLY GRAM POSITIVE COCCI CRITICAL RESULT CALLED TO, READ BACK BY AND VERIFIED WITH: L SEAY PHARMD 06/24/19 2342 JDW    Culture (A)  Final    ESCHERICHIA COLI STAPHYLOCOCCUS SPECIES (COAGULASE NEGATIVE) THE SIGNIFICANCE OF ISOLATING THIS ORGANISM FROM A SINGLE SET OF BLOOD CULTURES WHEN MULTIPLE SETS ARE DRAWN IS UNCERTAIN. PLEASE NOTIFY THE MICROBIOLOGY DEPARTMENT WITHIN ONE WEEK IF SPECIATION AND SENSITIVITIES ARE REQUIRED. Performed at [facility], [place] [address],  [place],  [address]    Report Status 06/26/2019 FINAL  Final   Organism ID, Bacteria ESCHERICHIA COLI  Final      Susceptibility   Escherichia coli - MIC*    AMPICILLIN 4 SENSITIVE Sensitive     CEFAZOLIN <=4 SENSITIVE Sensitive     CEFEPIME <=1 SENSITIVE Sensitive     CEFTAZIDIME <=1 SENSITIVE Sensitive     CEFTRIAXONE <=1 SENSITIVE Sensitive     CIPROFLOXACIN 1 SENSITIVE Sensitive     GENTAMICIN <=1 SENSITIVE Sensitive     IMIPENEM <=0.25 SENSITIVE Sensitive     TRIMETH/SULFA <=20 SENSITIVE Sensitive     AMPICILLIN/SULBACTAM <=2 SENSITIVE Sensitive     PIP/TAZO <=4 SENSITIVE Sensitive     Extended ESBL NEGATIVE Sensitive     * ESCHERICHIA COLI  Blood Culture ID Panel (Reflexed)     Status: Abnormal   Collection Time: 06/23/19  9:03 PM  Result Value Ref Range Status   Enterococcus species NOT DETECTED NOT DETECTED Final   Listeria monocytogenes NOT DETECTED NOT DETECTED Final   Staphylococcus species NOT DETECTED NOT DETECTED Final   Staphylococcus  aureus (BCID) NOT DETECTED NOT DETECTED Final   Streptococcus species NOT DETECTED NOT DETECTED Final   Streptococcus agalactiae NOT DETECTED NOT DETECTED Final   Streptococcus pneumoniae NOT DETECTED NOT DETECTED Final   Streptococcus pyogenes NOT DETECTED NOT DETECTED Final   Acinetobacter baumannii NOT DETECTED NOT DETECTED Final   Enterobacteriaceae species DETECTED (A) NOT DETECTED Final    Comment: Enterobacteriaceae represent a large family of gram-negative bacteria, not a single organism. CRITICAL RESULT CALLED TO, READ BACK BY AND VERIFIED WITH: Reece Packer PharmD 17:10 06/24/19 ([email or web address])    Enterobacter cloacae complex NOT DETECTED NOT DETECTED Final   Escherichia coli DETECTED (A) NOT DETECTED Final    Comment: CRITICAL RESULT CALLED TO, READ BACK BY AND VERIFIED WITH: Reece Packer PharmD 17:10 06/24/19 ([email or web address])    Klebsiella oxytoca NOT DETECTED NOT DETECTED Final   Klebsiella pneumoniae NOT DETECTED NOT DETECTED Final    Proteus species NOT DETECTED NOT DETECTED Final   Serratia marcescens NOT DETECTED NOT DETECTED Final   Carbapenem resistance NOT DETECTED NOT DETECTED Final   Haemophilus influenzae NOT DETECTED NOT DETECTED Final   Neisseria meningitidis NOT DETECTED NOT DETECTED Final   Pseudomonas aeruginosa NOT DETECTED NOT DETECTED Final   Candida albicans NOT DETECTED NOT DETECTED Final   Candida glabrata NOT DETECTED NOT DETECTED Final   Candida krusei NOT DETECTED NOT DETECTED Final   Candida parapsilosis NOT DETECTED NOT DETECTED Final   Candida tropicalis NOT DETECTED NOT DETECTED Final    Comment: Performed at [facility], [place] [address] [place], [place] [address]  Blood Culture ID Panel (Reflexed)     Status: Abnormal   Collection Time: 06/23/19  9:03 PM  Result Value Ref Range Status   Enterococcus species NOT DETECTED NOT DETECTED Final   Listeria monocytogenes NOT DETECTED NOT DETECTED Final   Staphylococcus species DETECTED (A) NOT DETECTED Final    Comment: Methicillin (oxacillin) susceptible coagulase negative staphylococcus. Possible blood culture contaminant (unless isolated from more than one blood culture draw or clinical case suggests pathogenicity). No antibiotic treatment is indicated for blood  culture contaminants. CRITICAL RESULT CALLED TO, READ BACK BY AND VERIFIED WITH: L SEAY PHARMD 06/24/19 2342 JDW    Staphylococcus aureus (BCID) NOT DETECTED NOT DETECTED Final   Methicillin resistance NOT DETECTED NOT DETECTED Final   Streptococcus species NOT DETECTED NOT DETECTED Final   Streptococcus agalactiae NOT DETECTED NOT DETECTED Final   Streptococcus pneumoniae NOT DETECTED NOT DETECTED Final   Streptococcus pyogenes NOT DETECTED NOT DETECTED Final   Acinetobacter baumannii NOT DETECTED NOT DETECTED Final   Enterobacteriaceae species NOT DETECTED NOT DETECTED Final   Enterobacter cloacae complex NOT DETECTED NOT DETECTED Final   Escherichia coli NOT DETECTED NOT  DETECTED Final   Klebsiella oxytoca NOT DETECTED NOT DETECTED Final   Klebsiella pneumoniae NOT DETECTED NOT DETECTED Final   Proteus species NOT DETECTED NOT DETECTED Final   Serratia marcescens NOT DETECTED NOT DETECTED Final   Haemophilus influenzae NOT DETECTED NOT DETECTED Final   Neisseria meningitidis NOT DETECTED NOT DETECTED Final   Pseudomonas aeruginosa NOT DETECTED NOT DETECTED Final   Candida albicans NOT DETECTED NOT DETECTED Final   Candida glabrata NOT DETECTED NOT DETECTED Final   Candida krusei NOT DETECTED NOT DETECTED Final   Candida parapsilosis NOT DETECTED NOT DETECTED Final   Candida tropicalis NOT DETECTED NOT DETECTED Final    Comment: Performed at [facility], [place] [address] [place], [place] [address]  SARS Coronavirus 2 [facility]  order, Performed in [facility] hospital lab) Nasopharyngeal Nasopharyngeal Swab     Status: None   Collection Time: 06/23/19  9:56 PM   Specimen: Nasopharyngeal Swab  Result Value Ref Range Status   SARS Coronavirus 2 NEGATIVE NEGATIVE Final    Comment: (NOTE) If result is NEGATIVE SARS-CoV-2 target nucleic acids are NOT DETECTED. The SARS-CoV-2 RNA is generally detectable in upper and lower  respiratory specimens during the acute phase of infection. The lowest  concentration of SARS-CoV-2 viral copies this assay can detect is 250  copies / mL. A negative result does not preclude SARS-CoV-2 infection  and should not be used as the sole basis for treatment or other  patient management decisions.  A negative result may occur with  improper specimen collection / handling, submission of specimen other  than nasopharyngeal swab, presence of viral mutation(s) within the  areas targeted by this assay, and inadequate number of viral copies  (<250 copies / mL). A negative result must be combined with clinical  observations, patient history, and epidemiological information. If result is POSITIVE SARS-CoV-2 target nucleic acids are  DETECTED. The SARS-CoV-2 RNA is generally detectable in upper and lower  respiratory specimens dur ing the acute phase of infection.  Positive  results are indicative of active infection with SARS-CoV-2.  Clinical  correlation with patient history and other diagnostic information is  necessary to determine patient infection status.  Positive results do  not rule out bacterial infection or co-infection with other viruses. If result is PRESUMPTIVE POSTIVE SARS-CoV-2 nucleic acids MAY BE PRESENT.   A presumptive positive result was obtained on the submitted specimen  and confirmed on repeat testing.  While 2019 novel coronavirus  (SARS-CoV-2) nucleic acids may be present in the submitted sample  additional confirmatory testing may be necessary for epidemiological  and / or clinical management purposes  to differentiate between  SARS-CoV-2 and other Sarbecovirus currently known to infect humans.  If clinically indicated additional testing with an alternate test  methodology [phone]) is advised. The SARS-CoV-2 RNA is generally  detectable in upper and lower respiratory sp ecimens during the acute  phase of infection. The expected result is Negative. Fact Sheet for Patients:  StrictlyIdeas.no Fact Sheet for Healthcare Providers: BankingDealers.co.za This test is not yet approved or cleared by the [place] FDA and has been authorized for detection and/or diagnosis of SARS-CoV-2 by FDA under an Emergency Use Authorization (EUA).  This EUA will remain in effect (meaning this test can be used) for the duration of the COVID-19 declaration under Section 564(b)(1) of the Act, 21 U.S.C. section 360bbb-3(b)(1), unless the authorization is terminated or revoked sooner. Performed at [facility], [place] [address], [place], [place] [address]   Culture, blood (routine x 2)     Status: None (Preliminary result)   Collection Time: 06/25/19  2:30  PM   Specimen: BLOOD  Result Value Ref Range Status   Specimen Description BLOOD LEFT ANTECUBITAL  Final   Special Requests   Final    BOTTLES DRAWN AEROBIC AND ANAEROBIC Blood Culture adequate volume   Culture   Final    NO GROWTH 4 DAYS Performed at [facility], [place] [address], [place], [place] [address]    Report Status PENDING  Incomplete  Culture, blood (routine x 2)     Status: None (Preliminary result)   Collection Time: 06/25/19  3:00 PM   Specimen: BLOOD LEFT ARM  Result Value Ref Range Status   Specimen  Description BLOOD LEFT ARM  Final   Special Requests   Final    BOTTLES DRAWN AEROBIC AND ANAEROBIC Blood Culture adequate volume   Culture   Final    NO GROWTH 4 DAYS Performed at [facility], [address] [place], [place] [address]    Report Status PENDING  Incomplete  Surgical PCR screen     Status: None   Collection Time: 06/26/19  6:34 AM   Specimen: Nasal Mucosa; Nasal Swab  Result Value Ref Range Status   MRSA, PCR NEGATIVE NEGATIVE Final   Staphylococcus aureus NEGATIVE NEGATIVE Final    Comment: (NOTE) The Xpert SA Assay (FDA approved for NASAL specimens in patients 59 years of age and older), is one component of a comprehensive surveillance program. It is not intended to diagnose infection nor to guide or monitor treatment. Performed at [facility], [place] [address], [place],  [address]      Labs: Basic Metabolic Panel: Recent Labs  Lab 06/24/19 [phone] 06/25/19 0324 06/26/19 0524 06/27/19 0502 06/28/19 0917 06/29/19 0610  NA 140 137 137 140 141 142  K 3.4* 4.0 4.0 3.9 3.6 3.5  CL 106 106 105 110 109 108  CO2 22 21* 22 22 23 23   GLUCOSE 122* 93 133* 99 100* 78  BUN 15 12 10 9  6* 5*  CREATININE 1.30* 1.40* 1.36* 1.09 1.07 1.02  CALCIUM 7.9* 7.6* 7.9* 7.7* 8.0* 8.2*  MG 1.2* 1.9  --  1.9  --   --   PHOS 2.4* 3.2  --  2.5  --   --    Liver Function Tests: Recent Labs  Lab 06/25/19 0324 06/26/19 0524  06/27/19 0502 06/28/19 0917 06/29/19 0610  AST 261* 80* 39 25 24  ALT 352* 220* 156* 108* 86*  ALKPHOS 186* 162* 154* 135* 141*  BILITOT 1.0 0.8 0.7 0.7 0.9  PROT 5.1* 5.1* 5.2* 5.3* 5.6*  ALBUMIN 2.6* 2.5* 2.5* 2.5* 2.7*   Recent Labs  Lab 06/23/19 1717  LIPASE 22   No results for input(s): AMMONIA in the last 168 hours. CBC: Recent Labs  Lab 06/27/19 1410 06/27/19 2113 06/28/19 0459 06/28/19 1632 06/29/19 0610  WBC 6.8 7.0 6.0 6.3 6.6  NEUTROABS 4.5 4.4 3.9 3.6 3.8  HGB 11.8* 11.6* 11.5* 12.4* 12.9*  HCT 34.2* 33.4* 33.8* 35.4* 38.3*  MCV 98.6 98.2 99.7 98.1 101.3*  PLT 144* 142* 147* 152 163   Cardiac Enzymes: No results for input(s): CKTOTAL, CKMB, CKMBINDEX, TROPONINI in the last 168 hours. BNP: BNP (last 3 results) No results for input(s): BNP in the last 8760 hours.  ProBNP (last 3 results) No results for input(s): PROBNP in the last 8760 hours.  CBG: Recent Labs  Lab 06/28/19 1231 06/28/19 1541 06/28/19 2118 06/29/19 0635 06/29/19 1128  GLUCAP 104* 90 96 89 85       Signed:  Dyanne Carrel M NP [facility] 06/29/2019, 12:48 PM

## 2019-06-29 NOTE — Progress Notes (Signed)
PT Cancellation Note  Patient Details Name: Ricardo Washington MRN: [id number] DOB: 1935-02-21   Cancelled Treatment:    Reason Eval/Treat Not Completed: RN present in room preparing pt for discharge. RN reports that pt does not need to see PT prior to d/c and he will be leaving soon. Will hold at this time, however if needs change, please call Acute Rehab Office at [phone]. If discharge is delayed, we will continue to follow tomorrow.    Thelma Comp 06/29/2019, 2:40 PM   Rolinda Roan, PT, DPT Acute Rehabilitation Services Pager: [phone] Office: [phone]

## 2019-06-29 NOTE — Telephone Encounter (Signed)
-----   Message from Irving Copas., MD sent at 06/28/2019  7:44 PM EDT ----- Ricardo Washington, Patient needs a hepatic function panel in approximately 1 to 2 weeks after his discharge (still remains in hospital). He is Dr. Steve Rattler patient. However once our results of biopsies/brushings return will dictate need for repeat ERCP. Merrie Roof, the patient may end up needing a spyglass if our biopsies are abnormal or nondiagnostic. Would you like to proceed with this or do you want me to do the repeat ERCP? Thank you. GM

## 2019-06-30 ENCOUNTER — Other Ambulatory Visit: Payer: Self-pay

## 2019-06-30 ENCOUNTER — Telehealth: Payer: Self-pay | Admitting: Gastroenterology

## 2019-06-30 ENCOUNTER — Encounter: Payer: Self-pay | Admitting: Gastroenterology

## 2019-06-30 ENCOUNTER — Encounter: Payer: Self-pay | Admitting: Family Medicine

## 2019-06-30 DIAGNOSIS — C221 Intrahepatic bile duct carcinoma: Secondary | ICD-10-CM

## 2019-06-30 LAB — CULTURE, BLOOD (ROUTINE X 2)
Culture: NO GROWTH
Culture: NO GROWTH
Special Requests: ADEQUATE
Special Requests: ADEQUATE

## 2019-06-30 NOTE — Telephone Encounter (Signed)
I called and spoke with the patient, patient's wife, patient's son about the results of our ERCP brushings and tissue acquisition.  The pathology has returned.  Brushings were negative.  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  This is consistent with patient's clinical history of cholangiocarcinoma based on imaging going back over the course of the last year.  Thankfully, the patient's liver tests and overall clinical status improved over the course of his hospitalization over the weekend with stable hemoglobin and with improving liver biochemical testing. I think with the diagnosis at hand of cholangiocarcinoma it is worthwhile for the patient to at least have an opportunity to discuss with oncology whether there would be any other options for him and/or whether surgical intervention would be considered.  They are not sure that they would even want to pursue chemotherapy but would want an opportunity to at least here if there were options available or if surgery could be an option. As such I will move forward with placing a referral to GI oncology. I will also relay this information to the patient's primary gastroenterologist Dr. [name]. Patty, please proceed with referral to oncology and a hepatic function panel in approximately 1 week to evaluate and ensure things are moving in the right direction. They would like to continue seeing Dr. [name] in the interim.  His clinic is closer for them as well. He will not need a repeat ERCP for approximately 4 to 6 months if his liver tests normalized or improved significantly for now.  Justice Britain, MD [facility] Advanced Endoscopy Office # [phone]

## 2019-06-30 NOTE — Telephone Encounter (Signed)
Thanks a lot for your help, Gabe.  RG

## 2019-06-30 NOTE — Telephone Encounter (Signed)
Referral has been placed for oncology lab order is also in [place].  Will call pt in 2 weeks to make sure lab is completed.

## 2019-07-01 ENCOUNTER — Encounter: Payer: Self-pay | Admitting: *Deleted

## 2019-07-01 ENCOUNTER — Telehealth: Payer: Self-pay | Admitting: Gastroenterology

## 2019-07-01 ENCOUNTER — Other Ambulatory Visit: Payer: Self-pay | Admitting: Hematology

## 2019-07-01 DIAGNOSIS — F0390 Unspecified dementia without behavioral disturbance: Secondary | ICD-10-CM | POA: Diagnosis not present

## 2019-07-01 DIAGNOSIS — Z7984 Long term (current) use of oral hypoglycemic drugs: Secondary | ICD-10-CM | POA: Diagnosis not present

## 2019-07-01 DIAGNOSIS — M109 Gout, unspecified: Secondary | ICD-10-CM | POA: Diagnosis not present

## 2019-07-01 DIAGNOSIS — N401 Enlarged prostate with lower urinary tract symptoms: Secondary | ICD-10-CM | POA: Diagnosis not present

## 2019-07-01 DIAGNOSIS — I2510 Atherosclerotic heart disease of native coronary artery without angina pectoris: Secondary | ICD-10-CM | POA: Diagnosis not present

## 2019-07-01 DIAGNOSIS — A4151 Sepsis due to Escherichia coli [E. coli]: Secondary | ICD-10-CM | POA: Diagnosis not present

## 2019-07-01 DIAGNOSIS — E785 Hyperlipidemia, unspecified: Secondary | ICD-10-CM | POA: Diagnosis not present

## 2019-07-01 DIAGNOSIS — E119 Type 2 diabetes mellitus without complications: Secondary | ICD-10-CM | POA: Diagnosis not present

## 2019-07-01 DIAGNOSIS — I10 Essential (primary) hypertension: Secondary | ICD-10-CM | POA: Diagnosis not present

## 2019-07-01 DIAGNOSIS — Z7982 Long term (current) use of aspirin: Secondary | ICD-10-CM | POA: Diagnosis not present

## 2019-07-01 DIAGNOSIS — D539 Nutritional anemia, unspecified: Secondary | ICD-10-CM

## 2019-07-01 DIAGNOSIS — C221 Intrahepatic bile duct carcinoma: Secondary | ICD-10-CM

## 2019-07-01 DIAGNOSIS — N139 Obstructive and reflux uropathy, unspecified: Secondary | ICD-10-CM | POA: Diagnosis not present

## 2019-07-01 DIAGNOSIS — Z951 Presence of aortocoronary bypass graft: Secondary | ICD-10-CM | POA: Diagnosis not present

## 2019-07-01 NOTE — Telephone Encounter (Signed)
Please review previous message and advise

## 2019-07-01 NOTE — Progress Notes (Signed)
[facility] CONSULT NOTE  Patient Care Team: McGowen, Adrian Blackwater, MD as PCP - General (Family Medicine) Josue Hector, MD as PCP - Cardiology (Cardiology) Kathie Rhodes, MD as Consulting Physician (Urology) Lavonna Monarch, MD as Consulting Physician (Dermatology) Jackquline Denmark, MD as Consulting Physician (Gastroenterology) Milus Banister, MD as Consulting Physician (Gastroenterology) Gerrit Halls, PA-C as Physician Assistant (Orthopedic Surgery) Sydnee Cabal, MD as Consulting Physician (Orthopedic Surgery) Pedro Earls, MD as Consulting Physician (Sports Medicine)  HEME/ONC OVERVIEW: 1. 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 Ardis Hughs) or IR bx (Dr. Reesa Chew) -08/2018: persistent FDG uptake in the liver without progression or metastatic disease; CA 19-9 normal -06/2019:  Persistent duct dilatation in segment 8 of the R hepatic lobe w/ possible central obstruction on MRI abdomen; no new lesion   ERCP with brushing; path showed high-grade glandular dysplagia with microscopic focus of invasive adenocarcinoma   PERTINENT NON-HEM/ONC PROBLEMS: 1. CAD s/p 3-vessel CABG in 2012  2. NASH  3. 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 ER in 04/2018 for evaluation of abdominal pain and nausea, and CT abdomen/pelvis showed focal  intrahepatic biliary ductal dilatation in the segment 8 of the liver, concerning for malignancy.  Patient was referred to Dr. [name] of gastroenterology, who performed extensive evaluation, including MRI abdomen and PET, which showed an FDG-avid lesion in the segment 8 of the right liver, but unfortunately the lesion was not amendable for EUS or IR biopsy.  Follow-up PET in 08/2018 showed persistent FDG uptake in the hepatic lesion without evidence of progression or metastatic disease.  After observation for approximately 6 months, repeat MRI of the abdomen showed persistent ductal dilatation in the segment 8 of the right hepatic lobe with possible central obstruction.  Patient ultimately underwent ERCP with brushing, which showed high-grade glandular dysplasia with microscopic focus of invasive adenocarcinoma.  Patient was referred to oncology for further evaluation. -I reviewed the imaging and pathology results in detail with the patient and his family -I also reviewed in detail the NCCN guideline -Given the early stage cholangiocarcinoma, the standard-of-care approach is for upfront resection, followed by adjuvant therapy is indicated -However, given the patient's advanced age and his extensive past medical history, his ability to tolerate any surgical resection is questionable -If the surgical resection is not feasible, radiation or arterially directed therapy may be reasonable options, given the relatively indolent behavior of the cholangiocarcinoma (stable since at least 04/2018) -I have requested the case to be discussed at GI tumor board -In the absence of definite advanced or metastatic disease, there is no indication for systemic chemotherapy; furthermore, he would likely tolerate systemic therapy very poorly due to his age and medical comorbidities -I will refer the patient to IR for consideration of local therapy options, and if IR-directed therapy is not feasible, we can consider SBRT    Macrocytic anemia -Possibly due to underlying liver disease -Hgb 12.5 today, stable  -Clinically,  patient denies any symptoms of bleeding -I have ordered B12, folate and iron panel to rule out any nutritional deficiencies -We will monitor it for now   Goals of care discussion -I discussed with the patient and his family (wife and son) that in light of the patient's advanced pain, poor performance status, and multiple medical comorbidities, he would unlikely tolerate any systemic chemotherapy or aggressive surgical intervention -Given that the tumor has not changed significantly over the past year, it is suggestive of somewhat indolent behavior, and local therapy may provide some durable control -I also cautioned the patient's family against frequent imaging surveillance, as it would unlikely alter our management; they expressed understanding and agreed with the plan  Orders Placed This Encounter  Procedures  . CBC with Differential (Cancer Center Only)    Standing Status:   Future    Standing Expiration Date:   08/06/2020  . CMP ([place] only)    Standing Status:   Future    Standing Expiration Date:   08/06/2020  . Ambulatory referral to Interventional Radiology    Referral Priority:   Urgent    Referral Type:   Consultation    Referral Reason:   Specialty Services Required    Referred to Provider:   Aletta Edouard, MD    Requested Specialty:   Interventional Radiology    Number of Visits Requested:   1   A total of more than 60 minutes were spent face-to-face with the patient during this encounter and over half of that time was spent on counseling and coordination of care as outlined above.    All questions were answered. The patient knows to call the clinic with any problems, questions or concerns.  Return in 2 weeks for labs and clinic follow-up.   Tish Men, MD 07/03/2019 10:12 AM   CHIEF COMPLAINTS/PURPOSE OF CONSULTATION:  "I just want to see if I make this go  away"  HISTORY OF PRESENTING ILLNESS:  Ricardo Washington 84 y.o. male is here because of newly diagnosed intrahepatic cholangiocarcinoma.  The patient presented to [facility] ER in 04/2018 for evaluation of abdominal pain, associated with nausea.  He previously had some gallbladder issues, and was recommended to have surgery, but he declined.  In the ER, CT abdomen/pelvis showed a questionable intrahepatic duct dilatation in the periphery of segment 8, concerning for early malignancy.  Patient was referred to GI for further evaluation, but due to the location of the tumor, it was felt to be unsafe to pursue any biopsy, and as result, he has been on observation.  He has had several PET in the interim that showed FDG-avid lesion in the corresponding area of the liver.  He ultimately underwent ERCP in 06/2019, and the cytology was positive for microscopic focus of invasive adenocarcinoma.  Patient reports fair appetite and approximately 5 pound weight loss over the past few weeks, but he denies any other constitutional symptoms, chest pain, dyspnea, abdominal pain, nausea, vomiting, diarrhea, or abnormal bleeding/bruising.  I have reviewed his chart and materials related to his cancer extensively and collaborated history with the patient. Summary of oncologic history is as follows: Oncology History  Cholangiocarcinoma [facility])   Initial Diagnosis   Cholangiocarcinoma ([place])   04/30/2018 Imaging   CT abdomen/pelvis w/ contrast: IMPRESSION: 1. There are findings in the distal sigmoid colon which could be related to acute diverticulitis. However, there is also mass-like thickening of the colon throughout this region. This may simply be reflective of a combination of  acute and chronic diverticular disease, however, correlation with nonemergent colonoscopy is recommended after resolution of the patient's acute illness to exclude the possibility of colorectal neoplasm. 2. Focal intrahepatic biliary ductal  dilatation most evident in segment 8 of the liver where there is also some regional atrophy. Ill-defined hypovascular area near the hepatic hilum. The possibility of a central neoplasm should be considered, and further evaluation with nonemergent MRI of the abdomen with and without IV gadolinium is strongly recommended in the near future. 3. Aortic atherosclerosis, in addition to 2 vessel coronary artery disease. 4. Additional incidental findings, as above.   05/03/2018 Imaging   MRI abdomen: 1. Mild motion degradation. 2. Intrahepatic duct dilatation centered in the periphery of segment 8 with a central area of subtle ductal caught off, probable mild T2 hyperintensity, and delayed post-contrast hypointensity or hypoenhancement. 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 Potential clinical strategies include focused ultrasound to allow possible sampling, PET, or if patient is not a good biopsy or surgical candidate, follow-up pre and post contrast abdominal MRI at 3 months. 3.  Aortic Atherosclerosis (ICD10-I70.0).   05/19/2018 Imaging   PET: IMPRESSION: 1. The corresponding to the MR abnormality, there is a focal area of increased uptake within the central aspect of segment 8 which is suspicious for underlying neoplasm, favor cholangiocarcinoma. 2. No additional areas of increased uptake identified to suggest metastatic disease.   08/20/2018 Imaging   PET: IMPRESSION: 1. Focus of activity centrally within liver remains but is slightly less intense and less focal. No clear evidence of new lesions within the liver. 2. No evidence of metastatic disease outside the liver. 3. Extensive activity in the bowel is physiologic and could mask a colon lesion.   06/24/2019 Imaging   MRI abdomen: IMPRESSION: 1. Persistent duct dilatation in segment 8/6 of the RIGHT hepatic lobe with suspicion of region central obstruction identified by hypermetabolic tissue on comparison FDG  PET scan. The MRI findings are very similar to MRI of 05/03/2018. The tumor is very poorly demonstrated on current exam. There is some limitation to the imaging due to patient body motion. 2. Normal distal common bile duct.  Normal pancreas.   06/26/2019 Procedure   ERCP: Impression:       - J-shaped deformity in the entire stomach.                           - Duodenal diverticulum.                           - The major papilla appeared normal.                           - The fluoroscopic examination was suspicious for                            sludge.                           - A single mild biliary narrowing was found in the                            right main hepatic duct region. The stricture was  indeterminate. This was brushed.                           - The right main hepatic duct was moderately                            dilated.                           - Choledocholithiasis, hemobilia, biliary sludge                            was found. Complete removal was accomplished by                            biliary sphincterotomy and balloon sphincteroplasty.                           - Tissue from within the biliary tree was also                            suctioned and placed into 2 separate jars for                            histology purposes.                           - One plastic biliary stent was placed into the                            right hepatic duct in order to attempt to traverse                            the biliary narrowing.   06/26/2019 Pathology Results   Accession: [id number]  1. Common Bile Duct , #1 - AT LEAST HIGH GRADE GLANDULAR DYSPLASIA. - SEE COMMENT. 2. Common Bile Duct , # 2 - HIGH GRADE GLANDULAR DYSPLASIA WITH MICROSCOPIC FOCI OF INVASIVE ADENOCARCINOMA. - SEE COMMENT.     MEDICAL HISTORY:  Past Medical History:  Diagnosis Date  . BPH with obstruction/lower urinary tract symptoms 10/27/2015   Dr. Karsten Ro   . Cataract   . Colon wall thickening 04/2018   "mass-like" per radiologist interpretation; colonoscopy as next step---f/u colonoscopy showed 'tics but o/w normal.  NO further colonoscopies needed due to age.  . Coronary artery disease    with preserved LV function.  . Dementia ([place])   . Diabetes mellitus without complication ([place])   . Diverticulosis of colon    severe, entire colon  . Dysphagia 11/2018   Barium swallow: mild esophageal dysmotility.  . Ectatic abdominal aorta ([place]) 01/2017   Abd u/s: 2.9 cm aortic ectasia--at risk for aneurism development.  Recheck aortic u/s 5 yrs.  . Erectile dysfunction due to arterial insufficiency   . Fatigue   . Gout    always 2nd toe L foot (uric acid 6.20 Dec 2014 per old records)  . Hepatic steatosis   . History of adenomatous polyp of colon 2002  . History of stomach ulcers   . Hyperlipidemia   .  Hypertension   . Hypogonadism male   . Klebsiella sepsis ([place]) 01/2017   due to acute biliary tract infection (no stones) and acute diverticulitis.  . Liver mass 04/2018   Bx 06/2019: probable cholangiocarcinoma ->with biliary obstruction.  . Macrocytic anemia 01/2017   vit B12 borderline low and iron borderline low: checking hemoccults and starting vit B12 PO and iron PO as of 02/11/17.  Vit B12 and folate normal as of GI f/u 06/2018.  . Myogenic ptosis of bilateral eyelids 2018   Plastic surgery in [place], [place] to do surg as of 03/2017.  Marland Kitchen NASH (nonalcoholic steatohepatitis) 06/2017   LFTs up, abd u/s showed fatty liver but no other abnormality.  . Osteoarthritis of right shoulder 09/2018   Near end-stage --->glenohumeral joint-->intra-articular steroid injection 09/2018 (Dr. Delilah Shan).  . Osteoarthritis of right shoulder    11/2018-->end stage, but not ready for total shoulder replacement.  . Past use of tobacco    quit 1984  . Rectal bleeding   . Rosacea   . S/P coronary artery bypass graft x 3   . Urine incontinence    Dr. Karsten Ro     SURGICAL HISTORY: Past Surgical History:  Procedure Laterality Date  . BILIARY BRUSHING  06/26/2019   PATH: invasive adenocarcinoma.  Procedure: BILIARY BRUSHING;  Surgeon: Rush Landmark Telford Nab., MD;  Location: [facility];  Service: Gastroenterology;;  . BILIARY DILATION  06/26/2019   Procedure: BILIARY DILATION;  Surgeon: Irving Copas., MD;  Location: [facility];  Service: Gastroenterology;;  . BILIARY STENT PLACEMENT  06/26/2019   Procedure: [place];  Surgeon: Irving Copas., MD;  Location: [facility];  Service: Gastroenterology;;  . CARDIOVASCULAR STRESS TEST  08/28/2016   Low risk myoview, normal EF, no ischemia.  Marland Kitchen CATARACT EXTRACTION, BILATERAL    . COLONOSCOPY  2002, 2006, 02/25/2009; 06/11/18   Polyp x 1 2002, none 2006 or 2010.  Adenomatous polyp 06/11/18+  signif diverticulosis.  NO FURTHER COLONOSCOPIES DUE TO AGE.  Marland Kitchen CORONARY ARTERY BYPASS GRAFT  06/2009   descending,saphenous vein graft to first obtuse marginal, sequential saphenous vein graft to posterior descending and posterolateral  . Endoscopic vein harvest right thigh    . ERCP N/A 06/26/2019   Procedure: ENDOSCOPIC RETROGRADE CHOLANGIOPANCREATOGRAPHY (ERCP);  Surgeon: Irving Copas., MD;  Location: [facility];  Service: Gastroenterology;  Laterality: N/A;  with stent   . IR RADIOLOGIST EVAL & MGMT  06/12/2018  . IR RADIOLOGIST EVAL & MGMT  09/18/2018  . IR RADIOLOGIST EVAL & MGMT  05/26/2019  . PTCA    . REMOVAL OF STONES  06/26/2019   Procedure: REMOVAL OF STONES;  Surgeon: Rush Landmark Telford Nab., MD;  Location: [facility];  Service: Gastroenterology;;  . Joan Mayans  06/26/2019   Procedure: Joan Mayans;  Surgeon: Irving Copas., MD;  Location: [facility];  Service: Gastroenterology;;  . TONSILLECTOMY    . UMBILICAL HERNIA REPAIR     2009  . VASECTOMY      SOCIAL HISTORY: Social History   Socioeconomic History  . Marital status: Married    Spouse  name: Not on file  . Number of children: 3  . Years of education: 32  . Highest education level: Not on file  Occupational History  . Not on file  Social Needs  . Financial resource strain: Not on file  . Food insecurity    Worry: Not on file    Inability: Not on file  . Transportation needs    Medical: Not on file  Non-medical: Not on file  Tobacco Use  . Smoking status: Former Smoker    Packs/day: 1.00    Years: 30.00    Pack years: 30.00    Types: Cigarettes    Quit date: 03/15/1983    Years since quitting: 36.3  . Smokeless tobacco: Never Used  . Tobacco comment: Quit 1984  Substance and Sexual Activity  . Alcohol use: Yes    Alcohol/week: 5.0 - 7.0 standard drinks    Types: 5 - 7 Glasses of wine per week    Comment: quit etoh 1999 but in ~ 2017 began having a glass of wine with dinner   . Drug use: No  . Sexual activity: Never  Lifestyle  . Physical activity    Days per week: Not on file    Minutes per session: Not on file  . Stress: Not on file  Relationships  . Social Herbalist on phone: Not on file    Gets together: Not on file    Attends religious service: Not on file    Active member of club or organization: Not on file    Attends meetings of clubs or organizations: Not on file    Relationship status: Not on file  . Intimate partner violence    Fear of current or ex partner: Not on file    Emotionally abused: Not on file    Physically abused: Not on file    Forced sexual activity: Not on file  Other Topics Concern  . Not on file  Social History Narrative   Married 1957, has 3 sons.   [place]. 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    End of life Care: DNR, no prolonged heroic measures or prolonged supportive care.    Former smoker, quit 1984.   Quit alcohol when dx'd with DM in 2009. ~ 2017 began having glass of wine with dinner.   No exercise.    FAMILY  HISTORY: Family History  Problem Relation Age of Onset  . Cancer Mother   . Cancer Father        pt points to LLQ as  area of cancer, so potentially could have been intestinal.     . Colon cancer Neg Hx   . Esophageal cancer Neg Hx   . Stomach cancer Neg Hx   . Rectal cancer Neg Hx     ALLERGIES:  is allergic to sulfonamide derivatives.  MEDICATIONS:  Current Outpatient Medications  Medication Sig Dispense Refill  . allopurinol (ZYLOPRIM) 100 MG tablet TAKE ONE TABLET BY MOUTH ON MONDAY, WEDNESDAY, AND FRIDAY (Patient taking differently: Take 100 mg by mouth 3 (three) times a week. ) 36 tablet 3  . ALPRAZolam (NIRAVAM) 0.25 MG dissolvable tablet Take 0.25 mg by mouth at bedtime as needed for anxiety.    Marland Kitchen amoxicillin-clavulanate (AUGMENTIN) 875-125 MG tablet Take 1 tablet by mouth 2 (two) times daily for 5 days. 10 tablet 0  . aspirin 325 MG tablet Take 325 mg by mouth daily.      . betamethasone dipropionate (DIPROLENE) 0.05 % cream Apply 1 application topically daily.     . ferrous sulfate 325 (65 FE) MG EC tablet Take 325 mg by mouth daily with breakfast.    . finasteride (PROSCAR) 5 MG tablet TAKE ONE TABLET BY MOUTH EVERY DAY 90 tablet 0  . Lancets (ONETOUCH ULTRASOFT) lancets Use to check blood sugar once daily 100 each 12  .  lisinopril (PRINIVIL,ZESTRIL) 5 MG tablet TAKE ONE TABLET BY MOUTH DAILY (Patient taking differently: Take 5 mg by mouth daily. ) 90 tablet 1  . metFORMIN (GLUCOPHAGE-XR) 500 MG 24 hr tablet TAKE ONE TABLET BY MOUTH TWICE DAILY WITH MEALS (Patient taking differently: Take 500 mg by mouth 2 (two) times daily with a meal. ) 180 tablet 1  . metoprolol tartrate (LOPRESSOR) 25 MG tablet TAKE ONE TABLET BY MOUTH TWICE DAILY (Patient taking differently: Take 25 mg by mouth 2 (two) times daily. ) 60 tablet 5  . MULTIPLE VITAMIN PO Take 1 tablet by mouth daily.     Marland Kitchen omeprazole (PRILOSEC) 40 MG capsule Take 1 capsule (40 mg total) by mouth daily. 30 capsule 3  .  PREVIDENT 5000 PLUS 1.1 % CREA dental cream Place 1 application onto teeth at bedtime.     . rosuvastatin (CRESTOR) 10 MG tablet TAKE ONE TABLET BY MOUTH EVERY DAY (Patient taking differently: Take 10 mg by mouth daily. ) 90 tablet 0  . tamsulosin (FLOMAX) 0.4 MG CAPS capsule TAKE ONE CAPSULE BY MOUTH DAILY 90 capsule 3  . Trospium Chloride 60 MG CP24 TAKE ONE CAPSULE BY MOUTH DAILY 90 capsule 1  . vitamin B-12 (CYANOCOBALAMIN) 1000 MCG tablet Take 1,000 mcg by mouth daily.    . nitroGLYCERIN (NITROSTAT) 0.4 MG SL tablet Place 1 tablet (0.4 mg total) under the tongue every 5 (five) minutes as needed for chest pain. (Patient not taking: Reported on 05/21/2019) 25 tablet 3   No current facility-administered medications for this visit.     REVIEW OF SYSTEMS:   Constitutional: ( - ) fevers, ( - )  chills , ( - ) night sweats Eyes: ( - ) blurriness of vision, ( - ) double vision, ( - ) watery eyes Ears, nose, mouth, throat, and face: ( - ) mucositis, ( - ) sore throat Respiratory: ( - ) cough, ( - ) dyspnea, ( - ) wheezes Cardiovascular: ( - ) palpitation, ( - ) chest discomfort, ( - ) lower extremity swelling Gastrointestinal:  ( - ) nausea, ( - ) heartburn, ( - ) change in bowel habits Skin: ( - ) abnormal skin rashes Lymphatics: ( - ) new lymphadenopathy, ( - ) easy bruising Neurological: ( - ) numbness, ( - ) tingling, ( - ) new weaknesses Behavioral/Psych: ( - ) mood change, ( - ) new changes  All other systems were reviewed with the patient and are negative.  PHYSICAL EXAMINATION: ECOG PERFORMANCE STATUS: 3 - Symptomatic, >50% confined to bed  Vitals:   07/03/19 0922  BP: (!) 116/54  Pulse: 61  Resp: 18  Temp: 98.7 F (37.1 C)  SpO2: 98%   Filed Weights   07/03/19 0922  Weight: 164 lb 12.8 oz (74.8 kg)    GENERAL: alert, no distress and comfortable, frail appearing  SKIN: skin color, texture, turgor are normal, no rashes or significant lesions EYES: conjunctiva are pink and  non-injected, sclera clear OROPHARYNX: no exudate, no erythema; lips, buccal mucosa, and tongue normal  NECK: supple, non-tender LYMPH:  no palpable lymphadenopathy in the cervical  LUNGS: clear to auscultation with normal breathing effort HEART: regular rate & rhythm, no murmurs, no lower extremity edema ABDOMEN: soft, non-tender, non-distended, normal bowel sounds Musculoskeletal: no cyanosis of digits and no clubbing  PSYCH: alert, slowed speech, significant difficult with hearing  NEURO: difficulty w/ short-term memory   LABORATORY DATA:  I have reviewed the data as listed Lab Results  Component  Value Date   WBC 8.7 07/03/2019   HGB 12.5 (L) 07/03/2019   HCT 37.3 (L) 07/03/2019   MCV 101.1 (H) 07/03/2019   PLT 261 07/03/2019   Lab Results  Component Value Date   NA 138 07/03/2019   K 3.8 07/03/2019   CL 103 07/03/2019   CO2 26 07/03/2019    RADIOGRAPHIC STUDIES: I have personally reviewed the radiological images as listed and agreed with the findings in the report. Mr 3d Recon At Scanner  Result Date: 06/25/2019 CLINICAL DATA:  Abn liver function tests (LFTs) Abd pain, extrahepatic cholangiocarcinoma suspected49m of gadavist. 84year old male with presumed cholangiocarcinoma, having been monitored by imaging over the last year. Now presenting with abdominal pain, significantly elevated liver enzymes and leukocytosis. No fever. EXAM: MRI ABDOMEN WITHOUT AND WITH CONTRAST (INCLUDING MRCP) TECHNIQUE: Multiplanar multisequence MR imaging of the abdomen was performed both before and after the administration of intravenous contrast. Heavily T2-weighted images of the biliary and pancreatic ducts were obtained, and three-dimensional MRCP images were rendered by post processing. CONTRAST:  Seven mL Gadavist COMPARISON:  CT 06/23/2019, MRI 05/03/2018, PET-CT 05/18/2019 FINDINGS: Lower chest:  Small bilateral pleural effusions. Hepatobiliary: There is mild duct dilatation within the RIGHT  hepatic lobe (segment 8/6) which is similar to comparison MRI 05/03/2018 (image 19/1)7. There is minimal contrast enhancement through this region of obstruction. Lesion is better identified on the comparison FDG PET scan. The MRCP sequence do demonstrate interruption of the ducts in the same segment. The distal common bile duct is normal caliber. No new lesions identified. Pancreas: Normal pancreatic parenchymal intensity. No ductal dilatation or inflammation. Spleen: Normal spleen. Adrenals/urinary tract: Adrenal glands and kidneys are normal. Stomach/Bowel: Stomach and limited of the small bowel is unremarkable Vascular/Lymphatic: Abdominal aortic normal caliber. No retroperitoneal periportal lymphadenopathy. Musculoskeletal: No aggressive osseous lesion IMPRESSION: 1. Persistent duct dilatation in segment 8/6 of the RIGHT hepatic lobe with suspicion of region central obstruction identified by hypermetabolic tissue on comparison FDG PET scan. The MRI findings are very similar to MRI of 05/03/2018. The tumor is very poorly demonstrated on current exam. There is some limitation to the imaging due to patient body motion. 2. Normal distal common bile duct.  Normal pancreas. Electronically Signed   By: SSuzy BouchardM.D.   On: 06/25/2019 07:57   Dg Chest Port 1 View  Result Date: 06/23/2019 CLINICAL DATA:  Chest pain, back pain, constipation for 1 day EXAM: PORTABLE CHEST 1 VIEW COMPARISON:  Radiograph May 05, 2011 FINDINGS: Postsurgical changes related to prior CABG including intact and aligned sternotomy wires and multiple surgical clips projecting over the mediastinum. Cardiac silhouette is borderline enlarged linear opacities in the right lung base likely reflect subsegmental atelectasis no consolidation, features of edema, pneumothorax, or effusion. Pulmonary vascularity is normally distributed. No acute osseous or soft tissue abnormality. Degenerative changes are present in the and imaged spine and  shoulders. Cardiac leads overlie the chest. IMPRESSION: No acute cardiopulmonary abnormality Electronically Signed   By: PLovena LeM.D.   On: 06/23/2019 17:04   Dg Ercp Biliary & Pancreatic Ducts  Result Date: 06/26/2019 CLINICAL DATA:  Obstructive jaundice. ERCP with biliary stent placement. EXAM: ERCP TECHNIQUE: Multiple spot images obtained with the fluoroscopic device and submitted for interpretation post-procedure. COMPARISON:  CT the chest, abdomen pelvis-06/23/2019; MRCP-06/24/2019 FINDINGS: 13 spot intraoperative fluoroscopic images of the right upper abdominal quadrant during ERCP are provided for review Initial image demonstrates an ERCP probe overlying the right upper abdominal quadrant. Subsequent images demonstrate  selective cannulation opacification of the common bile duct. Subsequent images demonstrate insufflation of a balloon within the central aspect of the CBD with subsequent biliary sweeping and presumed sphincterotomy with additional images demonstrate biliary plasty at the level of the ampulla (image labeled 11). There is minimal opacification of the intrahepatic biliary tree which appears mildly dilated. There is minimal opacification of the cystic duct with minimal passage of contrast into the gallbladder lumen. There is no definitive opacification of the pancreatic duct. Completion image demonstrates placement of an internal plastic biliary stent. IMPRESSION: ERCP with biliary sweeping, sphincterotomy / biliary plasty and internal biliary stent placement as above. These images were submitted for radiologic interpretation only. Please see the procedural report for the amount of contrast and the fluoroscopy time utilized. Electronically Signed   By: Sandi Mariscal M.D.   On: 06/26/2019 13:13   Mr Abdomen Mrcp Moise Boring Contast  Result Date: 06/25/2019 CLINICAL DATA:  Abn liver function tests (LFTs) Abd pain, extrahepatic cholangiocarcinoma suspected75m of gadavist. 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 Now presenting with abdominal pain, significantly elevated liver enzymes and leukocytosis. No fever. EXAM: MRI ABDOMEN WITHOUT AND WITH CONTRAST (INCLUDING MRCP) TECHNIQUE: Multiplanar multisequence MR imaging of the abdomen was performed both before and after the administration of intravenous contrast. Heavily T2-weighted images of the biliary and pancreatic ducts were obtained, and three-dimensional MRCP images were rendered by post processing. CONTRAST:  Seven mL Gadavist COMPARISON:  CT 06/23/2019, MRI 05/03/2018, PET-CT 05/18/2019 FINDINGS: Lower chest:  Small bilateral pleural effusions. Hepatobiliary: There is mild duct dilatation within the RIGHT hepatic lobe (segment 8/6) which is similar to comparison MRI 05/03/2018 (image 19/1)7. There is minimal contrast enhancement through this region of obstruction. Lesion is better identified on the comparison FDG PET scan. The MRCP sequence do demonstrate interruption of the ducts in the same segment. The distal common bile duct is normal caliber. No new lesions identified. Pancreas: Normal pancreatic parenchymal intensity. No ductal dilatation or inflammation. Spleen: Normal spleen. Adrenals/urinary tract: Adrenal glands and kidneys are normal. Stomach/Bowel: Stomach and limited of the small bowel is unremarkable Vascular/Lymphatic: Abdominal aortic normal caliber. No retroperitoneal periportal lymphadenopathy. Musculoskeletal: No aggressive osseous lesion IMPRESSION: 1. Persistent duct dilatation in segment 8/6 of the RIGHT hepatic lobe with suspicion of region central obstruction identified by hypermetabolic tissue on comparison FDG PET scan. The MRI findings are very similar to MRI of 05/03/2018. The tumor is very poorly demonstrated on current exam. There is some limitation to the imaging due to patient body motion. 2. Normal distal common bile duct.  Normal pancreas.  Electronically Signed   By: SSuzy BouchardM.D.   On: 06/25/2019 07:57   Ct Angio Chest/abd/pel For Dissection W And/or Wo Contrast  Result Date: 06/23/2019 CLINICAL DATA:  Chest and back pain for 1 day, no known injury, initial encounter EXAM: CT ANGIOGRAPHY CHEST, ABDOMEN AND PELVIS TECHNIQUE: Multidetector CT imaging through the chest, abdomen and pelvis was performed using the standard protocol during bolus administration of intravenous contrast. Multiplanar reconstructed images and MIPs were obtained and reviewed to evaluate the vascular anatomy. CONTRAST:  1043mOMNIPAQUE IOHEXOL 350 MG/ML SOLN COMPARISON:  None. FINDINGS: CTA CHEST FINDINGS Cardiovascular: Thoracic aorta demonstrates bovine branching anatomy. Mild atherosclerotic changes are seen without aneurysmal dilatation or dissection. Changes of prior coronary bypass grafting are seen. No cardiac enlargement is noted. Pulmonary artery is prominent and shows no definitive central pulmonary embolus although timing was not performed for  embolus evaluation. Coronary calcifications are noted. No pericardial effusion is noted. Mediastinum/Nodes: Thoracic inlet is within normal limits. No hilar or mediastinal adenopathy is noted. No mediastinal hematoma is seen. The esophagus as visualized is within normal limits. Lungs/Pleura: The lungs are well aerated bilaterally. No focal infiltrate or sizable effusion is seen. Mild bibasilar atelectatic changes are noted. No sizable parenchymal nodules are seen. Musculoskeletal: Degenerative changes of the thoracic spine are noted. No acute rib abnormality is seen. Prior median sternotomy is noted. Review of the MIP images confirms the above findings. CTA ABDOMEN AND PELVIS FINDINGS VASCULAR Aorta: The abdominal aorta demonstrates atherosclerotic calcifications without aneurysmal dilatation or focal dissection. No extravasation is seen. Celiac: Calcifications are noted at the origin of the celiac axis although no  focal stenosis is noted. SMA: Calcifications are noted at the origin of the superior mesenteric artery although no focal stenosis is seen. Renals: Single renal arteries are identified bilaterally with mild narrowing at the origins. IMA: Patent without evidence of aneurysm, dissection, vasculitis or significant stenosis. Iliacs: Iliacs are widely patent without aneurysmal dilatation or dissection. Veins: No vein abnormality is noted. Review of the MIP images confirms the above findings. NON-VASCULAR Hepatobiliary: Liver demonstrates changes of mild fatty infiltration. Mild segmental biliary dilatation is again noted similar to that seen on the prior exam. The previously seen central hypodense lesion is again identified but stable. No significant increase in biliary dilatation is noted. The gallbladder is unremarkable. Pancreas: Unremarkable. No pancreatic ductal dilatation or surrounding inflammatory changes. Spleen: Normal in size without focal abnormality. Adrenals/Urinary Tract: Adrenal glands are within normal limits. Kidneys demonstrate a normal enhancement pattern bilaterally. No obstructive changes are seen. The bladder is well distended. Stomach/Bowel: Diverticular change of the colon is noted. Diffuse wall thickening is noted within the rectosigmoid region consistent with focal colitis. No perforation or abscess is noted. No significant pericolonic inflammatory changes are seen. The appendix is well visualized and within normal limits. No small bowel or gastric abnormality is noted. Lymphatic: No significant lymphadenopathy is noted. Reproductive: Prostate is unremarkable. Other: No abdominal wall hernia or abnormality. No abdominopelvic ascites. Musculoskeletal: Degenerative changes of lumbar spine are noted. No compression deformity is seen. Review of the MIP images confirms the above findings. IMPRESSION: No evidence of aneurysmal dilatation or dissection within the thoracic and abdominal aorta. Stable  segmental biliary dilatation within segment 8 of the liver stable from the prior exam. Mild decreased attenuation is noted centrally similar to that seen on prior MRI. No significant enlargement is noted. Wall thickening within the rectosigmoid consistent with focal colitis. No abscess or perforation is noted. Chronic changes as described above. Electronically Signed   By: Inez Catalina M.D.   On: 06/23/2019 19:54    PATHOLOGY: I have reviewed the pathology reports as documented in the oncologist history.

## 2019-07-01 NOTE — Progress Notes (Signed)
Reached out to Cain Sieve to introduce myself as the office RN Navigator and explain our new patient process. Spoke to [place], patient's wife as he has dementia.  Reviewed the reason for their referral and scheduled their new patient appointment along with labs. Provided address and directions to the office including call back phone number. Reviewed with patient any concerns they may have or any possible barriers to attending their appointment.   Informed patient about my role as a navigator and that I will meet with them prior to their New Patient appointment and more fully discuss what services I can provide. At this time patient has no further questions or needs.

## 2019-07-01 NOTE — Telephone Encounter (Signed)
Thanks so much for the update. GM

## 2019-07-02 NOTE — Telephone Encounter (Signed)
Called and spoke with patient's wife-verified DPR-Joanne informed of MD recommendations/instructions; per request by Mechele Claude patient has been scheduled for 08/03/2019 at 11:00 am; Mechele Claude verbalized understanding of information/instructions; Mechele Claude advised to call back to the office should questions/concerns arise;

## 2019-07-02 NOTE — Telephone Encounter (Signed)
See Dr. Donneta Romberg last note about labs Should see Dr. Marin Olp first Then follow-up with me in 4 weeks  thx  RG

## 2019-07-03 ENCOUNTER — Inpatient Hospital Stay (HOSPITAL_BASED_OUTPATIENT_CLINIC_OR_DEPARTMENT_OTHER): Payer: Medicare Other | Admitting: Hematology

## 2019-07-03 ENCOUNTER — Encounter: Payer: Self-pay | Admitting: Hematology

## 2019-07-03 ENCOUNTER — Other Ambulatory Visit: Payer: Self-pay | Admitting: Hematology

## 2019-07-03 ENCOUNTER — Inpatient Hospital Stay: Payer: Medicare Other | Attending: Hematology

## 2019-07-03 ENCOUNTER — Telehealth: Payer: Self-pay

## 2019-07-03 ENCOUNTER — Other Ambulatory Visit: Payer: Self-pay

## 2019-07-03 ENCOUNTER — Telehealth: Payer: Self-pay | Admitting: Hematology

## 2019-07-03 ENCOUNTER — Encounter: Payer: Self-pay | Admitting: *Deleted

## 2019-07-03 VITALS — BP 116/54 | HR 61 | Temp 98.7°F | Resp 18 | Ht 65.0 in | Wt 164.8 lb

## 2019-07-03 DIAGNOSIS — C221 Intrahepatic bile duct carcinoma: Secondary | ICD-10-CM

## 2019-07-03 DIAGNOSIS — G309 Alzheimer's disease, unspecified: Secondary | ICD-10-CM

## 2019-07-03 DIAGNOSIS — D539 Nutritional anemia, unspecified: Secondary | ICD-10-CM

## 2019-07-03 DIAGNOSIS — F0280 Dementia in other diseases classified elsewhere without behavioral disturbance: Secondary | ICD-10-CM | POA: Insufficient documentation

## 2019-07-03 DIAGNOSIS — I2510 Atherosclerotic heart disease of native coronary artery without angina pectoris: Secondary | ICD-10-CM | POA: Diagnosis not present

## 2019-07-03 DIAGNOSIS — E119 Type 2 diabetes mellitus without complications: Secondary | ICD-10-CM | POA: Diagnosis not present

## 2019-07-03 DIAGNOSIS — K7581 Nonalcoholic steatohepatitis (NASH): Secondary | ICD-10-CM | POA: Diagnosis not present

## 2019-07-03 DIAGNOSIS — D490 Neoplasm of unspecified behavior of digestive system: Secondary | ICD-10-CM

## 2019-07-03 DIAGNOSIS — Z7189 Other specified counseling: Secondary | ICD-10-CM | POA: Insufficient documentation

## 2019-07-03 LAB — CMP (CANCER CENTER ONLY)
ALT: 36 U/L (ref 0–44)
AST: 15 U/L (ref 15–41)
Albumin: 3.8 g/dL (ref 3.5–5.0)
Alkaline Phosphatase: 132 U/L — ABNORMAL HIGH (ref 38–126)
Anion gap: 9 (ref 5–15)
BUN: 13 mg/dL (ref 8–23)
CO2: 26 mmol/L (ref 22–32)
Calcium: 8.6 mg/dL — ABNORMAL LOW (ref 8.9–10.3)
Chloride: 103 mmol/L (ref 98–111)
Creatinine: 1.04 mg/dL (ref 0.61–1.24)
GFR, Est AFR Am: >60 mL/min (ref >60)
GFR, Estimated: >60 mL/min (ref >60)
Glucose, Bld: 101 mg/dL — ABNORMAL HIGH (ref 70–99)
Potassium: 3.8 mmol/L (ref 3.5–5.1)
Sodium: 138 mmol/L (ref 135–145)
Total Bilirubin: 0.7 mg/dL (ref 0.3–1.2)
Total Protein: 6.3 g/dL — ABNORMAL LOW (ref 6.5–8.1)

## 2019-07-03 LAB — FOLATE: Folate: 34.9 ng/mL (ref >5.9)

## 2019-07-03 LAB — CBC WITH DIFFERENTIAL (CANCER CENTER ONLY)
Abs Immature Granulocytes: 0.04 10*3/uL (ref 0.00–0.07)
Basophils Absolute: 0 10*3/uL (ref 0.0–0.1)
Basophils Relative: 0 %
Eosinophils Absolute: 0.1 10*3/uL (ref 0.0–0.5)
Eosinophils Relative: 2 %
HCT: 37.3 % — ABNORMAL LOW (ref 39.0–52.0)
Hemoglobin: 12.5 g/dL — ABNORMAL LOW (ref 13.0–17.0)
Immature Granulocytes: 1 %
Lymphocytes Relative: 20 %
Lymphs Abs: 1.8 10*3/uL (ref 0.7–4.0)
MCH: 33.9 pg (ref 26.0–34.0)
MCHC: 33.5 g/dL (ref 30.0–36.0)
MCV: 101.1 fL — ABNORMAL HIGH (ref 80.0–100.0)
Monocytes Absolute: 0.7 10*3/uL (ref 0.1–1.0)
Monocytes Relative: 8 %
Neutro Abs: 6.1 10*3/uL (ref 1.7–7.7)
Neutrophils Relative %: 69 %
Platelet Count: 261 10*3/uL (ref 150–400)
RBC: 3.69 MIL/uL — ABNORMAL LOW (ref 4.22–5.81)
RDW: 14.6 % (ref 11.5–15.5)
WBC Count: 8.7 10*3/uL (ref 4.0–10.5)
nRBC: 0 % (ref 0.0–0.2)

## 2019-07-03 LAB — IRON AND TIBC
Iron: 44 ug/dL (ref 42–163)
Saturation Ratios: 15 % — ABNORMAL LOW (ref 20–55)
TIBC: 285 ug/dL (ref 202–409)
UIBC: 241 ug/dL (ref 117–376)

## 2019-07-03 LAB — SAVE SMEAR(SSMR), FOR PROVIDER SLIDE REVIEW

## 2019-07-03 LAB — VITAMIN B12: Vitamin B-12: 1966 pg/mL — ABNORMAL HIGH (ref 180–914)

## 2019-07-03 LAB — FERRITIN: Ferritin: 168 ng/mL (ref 24–336)

## 2019-07-03 NOTE — Progress Notes (Signed)
.  Initial RN Navigator Patient Visit  Name: Ricardo Washington Date of Referral : 06/30/19 Diagnosis: Cholangiocarcinoma  Met with patient prior to their visit with MD. Hanley Seamen patient "Your Patient Navigator" handout which explains my role, areas in which I am able to help, and all the contact information for myself and the office. Also gave patient MD and Navigator business card. Reviewed with patient the general overview of expected course after initial diagnosis and time frame for all steps to be completed. Booklet included education on Cholangiocarcinoma, MyChart, Advanced Directive, and general office information.   Patient completed visit with Dr. Maylon Peppers  Revisited with patient after MD visit. Patient will need  Referral to Interventional Radiation - referral placed and Dr Maylon Peppers will make contact with Dr Kathlene Cote.   Patient understands all follow up procedures and expectations. They have my number to reach out for any further clarification or additional needs. Will call patient in 5-7 days to see if any further needs have presented, or if patient has any further questions or needs.

## 2019-07-03 NOTE — Telephone Encounter (Signed)
Margarita Rana, PT from [facility] called asking for verbal orders for physical therapy. She did a eval for pts gall bladder surgery and is asking for 2x4 weeks and 1x5 weeks. Weakness and unsteady gait.   Call back # [phone]

## 2019-07-03 NOTE — Telephone Encounter (Signed)
Appointments scheduled patient's wife notified per 8/14 los

## 2019-07-04 LAB — CANCER ANTIGEN 19-9: CA 19-9: 34 U/mL (ref 0–35)

## 2019-07-05 NOTE — Telephone Encounter (Signed)
This is fine 

## 2019-07-06 ENCOUNTER — Telehealth: Payer: Self-pay | Admitting: Family Medicine

## 2019-07-06 DIAGNOSIS — E119 Type 2 diabetes mellitus without complications: Secondary | ICD-10-CM | POA: Diagnosis not present

## 2019-07-06 DIAGNOSIS — I10 Essential (primary) hypertension: Secondary | ICD-10-CM | POA: Diagnosis not present

## 2019-07-06 DIAGNOSIS — I2510 Atherosclerotic heart disease of native coronary artery without angina pectoris: Secondary | ICD-10-CM | POA: Diagnosis not present

## 2019-07-06 DIAGNOSIS — F0390 Unspecified dementia without behavioral disturbance: Secondary | ICD-10-CM | POA: Diagnosis not present

## 2019-07-06 DIAGNOSIS — M109 Gout, unspecified: Secondary | ICD-10-CM | POA: Diagnosis not present

## 2019-07-06 DIAGNOSIS — A4151 Sepsis due to Escherichia coli [E. coli]: Secondary | ICD-10-CM | POA: Diagnosis not present

## 2019-07-06 NOTE — Telephone Encounter (Signed)
Yes this is fine.

## 2019-07-06 NOTE — Telephone Encounter (Signed)
PT was called and verbal orders were approved

## 2019-07-06 NOTE — Telephone Encounter (Signed)
Okay for verbal orders. 

## 2019-07-06 NOTE — Telephone Encounter (Signed)
Ricardo Washington Ann/ [facility] calling to request OT 1wk4 for pt.  Okay for all orders?

## 2019-07-06 NOTE — Telephone Encounter (Signed)
Caller/Agency: [facility] Number: [phone] [facility] to leave verbal on Voice Mail Requesting OT/PT/Skilled Nursing/Social Work/Speech Therapy: PT Frequency: 1 time a week 1 week, 2 times a week for 4 weeks, 1 time a week for 4 week.

## 2019-07-06 NOTE — Telephone Encounter (Signed)
Verbal given for OT 1wk4 to Marrion Coy and left detailed message for [facility] regarding verbal order for PT and frequency requested.

## 2019-07-07 ENCOUNTER — Other Ambulatory Visit: Payer: Self-pay | Admitting: Hematology

## 2019-07-07 DIAGNOSIS — M109 Gout, unspecified: Secondary | ICD-10-CM | POA: Diagnosis not present

## 2019-07-07 DIAGNOSIS — E785 Hyperlipidemia, unspecified: Secondary | ICD-10-CM

## 2019-07-07 DIAGNOSIS — Z7982 Long term (current) use of aspirin: Secondary | ICD-10-CM

## 2019-07-07 DIAGNOSIS — I10 Essential (primary) hypertension: Secondary | ICD-10-CM

## 2019-07-07 DIAGNOSIS — C221 Intrahepatic bile duct carcinoma: Secondary | ICD-10-CM

## 2019-07-07 DIAGNOSIS — N401 Enlarged prostate with lower urinary tract symptoms: Secondary | ICD-10-CM

## 2019-07-07 DIAGNOSIS — E119 Type 2 diabetes mellitus without complications: Secondary | ICD-10-CM

## 2019-07-07 DIAGNOSIS — Z7984 Long term (current) use of oral hypoglycemic drugs: Secondary | ICD-10-CM

## 2019-07-07 DIAGNOSIS — A4151 Sepsis due to Escherichia coli [E. coli]: Secondary | ICD-10-CM

## 2019-07-07 DIAGNOSIS — Z951 Presence of aortocoronary bypass graft: Secondary | ICD-10-CM

## 2019-07-07 DIAGNOSIS — N139 Obstructive and reflux uropathy, unspecified: Secondary | ICD-10-CM

## 2019-07-07 DIAGNOSIS — I2510 Atherosclerotic heart disease of native coronary artery without angina pectoris: Secondary | ICD-10-CM

## 2019-07-07 DIAGNOSIS — F0390 Unspecified dementia without behavioral disturbance: Secondary | ICD-10-CM

## 2019-07-07 NOTE — Telephone Encounter (Signed)
Hard copy of orders received and signed. Placed in bin to go up front for fax.

## 2019-07-09 ENCOUNTER — Encounter (HOSPITAL_BASED_OUTPATIENT_CLINIC_OR_DEPARTMENT_OTHER): Payer: Self-pay

## 2019-07-09 ENCOUNTER — Ambulatory Visit (HOSPITAL_BASED_OUTPATIENT_CLINIC_OR_DEPARTMENT_OTHER)
Admission: RE | Admit: 2019-07-09 | Discharge: 2019-07-09 | Disposition: A | Discharge status: Home / Self Care | Payer: Medicare Other | Source: Ambulatory Visit | Attending: Hematology | Admitting: Hematology

## 2019-07-09 ENCOUNTER — Other Ambulatory Visit: Payer: Self-pay

## 2019-07-09 DIAGNOSIS — C221 Intrahepatic bile duct carcinoma: Secondary | ICD-10-CM | POA: Diagnosis not present

## 2019-07-09 DIAGNOSIS — D490 Neoplasm of unspecified behavior of digestive system: Secondary | ICD-10-CM

## 2019-07-09 MED ORDER — IOHEXOL 350 MG/ML SOLN
100.0000 mL | Freq: Once | INTRAVENOUS | Status: AC | PRN
  Administered 2019-07-09: 11:00:00 100 mL via INTRAVENOUS

## 2019-07-10 DIAGNOSIS — M109 Gout, unspecified: Secondary | ICD-10-CM | POA: Diagnosis not present

## 2019-07-10 DIAGNOSIS — E119 Type 2 diabetes mellitus without complications: Secondary | ICD-10-CM | POA: Diagnosis not present

## 2019-07-10 DIAGNOSIS — A4151 Sepsis due to Escherichia coli [E. coli]: Secondary | ICD-10-CM | POA: Diagnosis not present

## 2019-07-10 DIAGNOSIS — I10 Essential (primary) hypertension: Secondary | ICD-10-CM | POA: Diagnosis not present

## 2019-07-10 DIAGNOSIS — I2510 Atherosclerotic heart disease of native coronary artery without angina pectoris: Secondary | ICD-10-CM | POA: Diagnosis not present

## 2019-07-10 DIAGNOSIS — F0390 Unspecified dementia without behavioral disturbance: Secondary | ICD-10-CM | POA: Diagnosis not present

## 2019-07-15 DIAGNOSIS — A4151 Sepsis due to Escherichia coli [E. coli]: Secondary | ICD-10-CM | POA: Diagnosis not present

## 2019-07-15 DIAGNOSIS — I2510 Atherosclerotic heart disease of native coronary artery without angina pectoris: Secondary | ICD-10-CM | POA: Diagnosis not present

## 2019-07-15 DIAGNOSIS — I10 Essential (primary) hypertension: Secondary | ICD-10-CM | POA: Diagnosis not present

## 2019-07-15 DIAGNOSIS — E119 Type 2 diabetes mellitus without complications: Secondary | ICD-10-CM | POA: Diagnosis not present

## 2019-07-15 DIAGNOSIS — M109 Gout, unspecified: Secondary | ICD-10-CM | POA: Diagnosis not present

## 2019-07-15 DIAGNOSIS — F0390 Unspecified dementia without behavioral disturbance: Secondary | ICD-10-CM | POA: Diagnosis not present

## 2019-07-17 ENCOUNTER — Telehealth: Payer: Self-pay | Admitting: Hematology

## 2019-07-17 ENCOUNTER — Inpatient Hospital Stay: Payer: Medicare Other

## 2019-07-17 ENCOUNTER — Encounter: Payer: Self-pay | Admitting: *Deleted

## 2019-07-17 ENCOUNTER — Inpatient Hospital Stay (HOSPITAL_BASED_OUTPATIENT_CLINIC_OR_DEPARTMENT_OTHER): Payer: Medicare Other | Admitting: Hematology

## 2019-07-17 ENCOUNTER — Other Ambulatory Visit: Payer: Self-pay

## 2019-07-17 ENCOUNTER — Encounter: Payer: Self-pay | Admitting: Hematology

## 2019-07-17 VITALS — BP 111/44 | HR 56 | Temp 97.6°F | Resp 19 | Ht 65.0 in | Wt 159.8 lb

## 2019-07-17 DIAGNOSIS — M109 Gout, unspecified: Secondary | ICD-10-CM | POA: Diagnosis not present

## 2019-07-17 DIAGNOSIS — D539 Nutritional anemia, unspecified: Secondary | ICD-10-CM

## 2019-07-17 DIAGNOSIS — C221 Intrahepatic bile duct carcinoma: Secondary | ICD-10-CM

## 2019-07-17 DIAGNOSIS — I10 Essential (primary) hypertension: Secondary | ICD-10-CM | POA: Diagnosis not present

## 2019-07-17 DIAGNOSIS — G309 Alzheimer's disease, unspecified: Secondary | ICD-10-CM | POA: Diagnosis not present

## 2019-07-17 DIAGNOSIS — F0280 Dementia in other diseases classified elsewhere without behavioral disturbance: Secondary | ICD-10-CM | POA: Diagnosis not present

## 2019-07-17 DIAGNOSIS — I2510 Atherosclerotic heart disease of native coronary artery without angina pectoris: Secondary | ICD-10-CM | POA: Diagnosis not present

## 2019-07-17 DIAGNOSIS — A4151 Sepsis due to Escherichia coli [E. coli]: Secondary | ICD-10-CM | POA: Diagnosis not present

## 2019-07-17 DIAGNOSIS — E119 Type 2 diabetes mellitus without complications: Secondary | ICD-10-CM | POA: Diagnosis not present

## 2019-07-17 DIAGNOSIS — F0390 Unspecified dementia without behavioral disturbance: Secondary | ICD-10-CM | POA: Diagnosis not present

## 2019-07-17 DIAGNOSIS — K7581 Nonalcoholic steatohepatitis (NASH): Secondary | ICD-10-CM | POA: Diagnosis not present

## 2019-07-17 LAB — CBC WITH DIFFERENTIAL (CANCER CENTER ONLY)
Abs Immature Granulocytes: 0.04 10*3/uL (ref 0.00–0.07)
Basophils Absolute: 0.1 10*3/uL (ref 0.0–0.1)
Basophils Relative: 1 %
Eosinophils Absolute: 0.2 10*3/uL (ref 0.0–0.5)
Eosinophils Relative: 3 %
HCT: 37.8 % — ABNORMAL LOW (ref 39.0–52.0)
Hemoglobin: 12.4 g/dL — ABNORMAL LOW (ref 13.0–17.0)
Immature Granulocytes: 1 %
Lymphocytes Relative: 18 %
Lymphs Abs: 1.5 10*3/uL (ref 0.7–4.0)
MCH: 34.3 pg — ABNORMAL HIGH (ref 26.0–34.0)
MCHC: 32.8 g/dL (ref 30.0–36.0)
MCV: 104.4 fL — ABNORMAL HIGH (ref 80.0–100.0)
Monocytes Absolute: 0.6 10*3/uL (ref 0.1–1.0)
Monocytes Relative: 7 %
Neutro Abs: 6.1 10*3/uL (ref 1.7–7.7)
Neutrophils Relative %: 70 %
Platelet Count: 198 10*3/uL (ref 150–400)
RBC: 3.62 MIL/uL — ABNORMAL LOW (ref 4.22–5.81)
RDW: 15.1 % (ref 11.5–15.5)
WBC Count: 8.5 10*3/uL (ref 4.0–10.5)
nRBC: 0 % (ref 0.0–0.2)

## 2019-07-17 LAB — CMP (CANCER CENTER ONLY)
ALT: 10 U/L (ref 0–44)
AST: 13 U/L — ABNORMAL LOW (ref 15–41)
Albumin: 3.7 g/dL (ref 3.5–5.0)
Alkaline Phosphatase: 101 U/L (ref 38–126)
Anion gap: 9 (ref 5–15)
BUN: 16 mg/dL (ref 8–23)
CO2: 26 mmol/L (ref 22–32)
Calcium: 8.7 mg/dL — ABNORMAL LOW (ref 8.9–10.3)
Chloride: 104 mmol/L (ref 98–111)
Creatinine: 1.09 mg/dL (ref 0.61–1.24)
GFR, Est AFR Am: >60 mL/min (ref >60)
GFR, Estimated: >60 mL/min (ref >60)
Glucose, Bld: 124 mg/dL — ABNORMAL HIGH (ref 70–99)
Potassium: 4.2 mmol/L (ref 3.5–5.1)
Sodium: 139 mmol/L (ref 135–145)
Total Bilirubin: 0.5 mg/dL (ref 0.3–1.2)
Total Protein: 6.3 g/dL — ABNORMAL LOW (ref 6.5–8.1)

## 2019-07-17 NOTE — Telephone Encounter (Signed)
Appointments scheduled per 8/28 los and Ricardo Washington was notified

## 2019-07-17 NOTE — Telephone Encounter (Signed)
Called and spoke with patient regarding change in appointment dates. He was ok with new date/time of 10/9/ per 8/28 los

## 2019-07-17 NOTE — Progress Notes (Signed)
[place] OFFICE PROGRESS NOTE  Patient Care Team: [name], MD as PCP - General (Family Medicine) Josue Hector, MD as PCP - Cardiology (Cardiology) Kathie Rhodes, MD as Consulting Physician (Urology) Lavonna Monarch, MD as Consulting Physician (Dermatology) Jackquline Denmark, MD as Consulting Physician (Gastroenterology) Milus Banister, MD as Consulting Physician (Gastroenterology) Gerrit Halls, PA-C as Physician Assistant (Orthopedic Surgery) Sydnee Cabal, MD as Consulting Physician (Orthopedic Surgery) Pedro Earls, MD as Consulting Physician (Sports Medicine)  HEME/ONC OVERVIEW: 1. 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 Ardis Hughs) or IR bx (Dr. Reesa Chew) -08/2018: persistent FDG uptake in the liver without progression or metastatic disease; CA 19-9 normal -06/2019:  Persistent duct dilatation in segment 8 of the R hepatic lobe w/ possible central obstruction on MRI abdomen; no new lesion   ERCP with brushing; path showed high-grade glandular dysplagia with microscopic focus of invasive adenocarcinoma   CT triple-phase showed ill-defined area in the central right liver, no abdominal lymphadenopathy    IMPRESSION: 1. Ill-defined subtle area of differential perfusion identified in the central right liver, corresponding to the location of the abnormal hypermetabolism seen on the previous PET-CT. Imaging features compatible with the patient's known neoplasm and there is associated mild intrahepatic biliary duct dilatation mainly in segments VII and VIII. 2. Minimal gallbladder wall thickening versus trace pericholecystic fluid. 3. No evidence for lymphadenopathy in the abdomen or  pelvis. 4. Left colonic diverticulosis without diverticulitis. 5.  Aortic Atherosclerois (ICD10-170.0)  PERTINENT NON-HEM/ONC PROBLEMS: 1. CAD s/p 3-vessel CABG in 2012  2. NASH  3. 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  There was no evidence of other liver abnormality or abdominal lymphadenopathy. -I reviewed imaging results in detail with the patient and his spouse -I have reached out to interventional radiology to see if the patient would be a candidate for any local therapy; if IR-directed therapy is not feasible, then the next option will be SBRT -Given the patient's advanced age, limited performance status, and extensive comorbidities, including moderate to severe Alzheimer's dementia, he is not a candidate for any systemic therapy, as the risks/harms of chemotherapy likely greatly outweigh any benefits.  The patient's spouse was in agreement of the plan. -We also discussed briefly regarding frequency of imaging surveillance.  If surveillance imaging shows disease progression in the future, treatment options would be very limited, which then raises question regarding the value of surveillance imaging if it is not going to change the management.  We ultimately agreed on surveillance imaging approximately every 6 months, and if it identifies any local disease recurrence, we will consider any local therapy options as needed.  Macrocytic anemia -Possibly due to underlying liver disease -Hgb 12.4 today, stable; nutritional studies, including iron, B12, folate, were  normal -Clinically, patient denies any symptoms of bleeding -We will monitor it for now   Moderate to severe Alzheimer's dementia -Patient has significant memory difficulty, but still drives  under the supervision of his wife -In light of the patient's advanced dementia, I raise my concern about the safety of operating a vehicle both to the patient and other drivers -I recommended patient to strongly consider stopping driving and continue follow-up with his PCP for further management  Orders Placed This Encounter  Procedures  . CBC with Differential (Cancer Center Only)    Standing Status:   Future    Standing Expiration Date:   08/20/2020  . CMP ([facility] only)    Standing Status:   Future    Standing Expiration Date:   08/20/2020   All questions were answered. The patient knows to call the clinic with any problems, questions or concerns. No barriers to learning was detected.  A total of more than 25 minutes were spent face-to-face with the patient during this encounter and over half of that time was spent on counseling and coordination of care as outlined above.   Tentatively return in 6 weeks for labs and clinic follow-up after local therapy (IR-directed vs. SBRT).   Ricardo Men, MD 07/17/2019 11:57 AM  CHIEF COMPLAINT: "I am doing okay"  INTERVAL HISTORY: Ricardo Washington returns to clinic for follow-up of cholangiocarcinoma.  Patient recently underwent CT triple phase, which showed an area of ill-defined procedure abnormality, corresponding to previous FDG avidity seen on PET.  Patient reports that his appetite is less, but denies any abdominal pain nausea, vomiting, diarrhea, hematochezia, or melena.  His wife has been concerned about his driving due to his moderate to severe dementia, but she has been hesitant to take away his keys.  He has not had any accident recently.  He denies any other complaint today.  SUMMARY OF ONCOLOGIC HISTORY: Oncology History  Cholangiocarcinoma [facility])    Initial Diagnosis   Cholangiocarcinoma ([place])   04/30/2018 Imaging   CT abdomen/pelvis w/ contrast: IMPRESSION: 1. There are findings in the distal sigmoid colon which could be related to acute diverticulitis. However, there is also mass-like thickening of the colon throughout this region. This may simply be reflective of a combination of acute and chronic diverticular disease, however, correlation with nonemergent colonoscopy is recommended after resolution of the patient's acute illness to exclude the possibility of colorectal neoplasm. 2. Focal intrahepatic biliary ductal dilatation most evident in segment 8 of the liver where there is also some regional atrophy. Ill-defined hypovascular area near the hepatic hilum. The possibility of a central neoplasm should be considered, and further evaluation with nonemergent MRI of the abdomen with and without IV gadolinium is strongly recommended in the near future. 3. Aortic atherosclerosis, in addition to 2 vessel coronary artery disease. 4. Additional incidental findings, as above.   05/03/2018 Imaging   MRI abdomen: 1. Mild motion degradation. 2. Intrahepatic duct dilatation centered in the periphery of segment 8 with a central area of subtle ductal caught off, probable mild T2 hyperintensity, and delayed post-contrast hypointensity or hypoenhancement. 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 Potential clinical strategies include focused ultrasound to allow possible sampling, PET, or if patient is not a good biopsy or surgical candidate, follow-up pre and post contrast abdominal MRI at 3 months. 3.  Aortic Atherosclerosis (ICD10-I70.0).   05/19/2018 Imaging   PET: IMPRESSION: 1. The corresponding to the MR abnormality, there is a focal area of increased uptake within the central aspect of segment 8 which is suspicious for underlying neoplasm, favor cholangiocarcinoma. 2. No additional areas of increased uptake  identified to suggest metastatic disease.   08/20/2018 Imaging  PET: IMPRESSION: 1. Focus of activity centrally within liver remains but is slightly less intense and less focal. No clear evidence of new lesions within the liver. 2. No evidence of metastatic disease outside the liver. 3. Extensive activity in the bowel is physiologic and could mask a colon lesion.   06/24/2019 Imaging   MRI abdomen: IMPRESSION: 1. Persistent duct dilatation in segment 8/6 of the RIGHT hepatic lobe with suspicion of region central obstruction identified by hypermetabolic tissue on comparison FDG PET scan. The MRI findings are very similar to MRI of 05/03/2018. The tumor is very poorly demonstrated on current exam. There is some limitation to the imaging due to patient body motion. 2. Normal distal common bile duct.  Normal pancreas.   06/26/2019 Procedure   ERCP: Impression:       - J-shaped deformity in the entire stomach.                           - Duodenal diverticulum.                           - The major papilla appeared normal.                           - The fluoroscopic examination was suspicious for                            sludge.                           - A single mild biliary narrowing was found in the                            right main hepatic duct region. The stricture was                            indeterminate. This was brushed.                           - The right main hepatic duct was moderately                            dilated.                           - Choledocholithiasis, hemobilia, biliary sludge                            was found. Complete removal was accomplished by                            biliary sphincterotomy and balloon sphincteroplasty.                           - Tissue from within the biliary tree was also                            suctioned and placed into 2 separate jars for  histology purposes.                            - One plastic biliary stent was placed into the                            right hepatic duct in order to attempt to traverse                            the biliary narrowing.   06/26/2019 Pathology Results   Accession: [id number]  1. Common Bile Duct , #1 - AT LEAST HIGH GRADE GLANDULAR DYSPLASIA. - SEE COMMENT. 2. Common Bile Duct , # 2 - HIGH GRADE GLANDULAR DYSPLASIA WITH MICROSCOPIC FOCI OF INVASIVE ADENOCARCINOMA. - SEE COMMENT.   07/09/2019 Imaging   CT abdomen/pelvis triple phase: IMPRESSION: 1. Ill-defined subtle area of differential perfusion identified in the central right liver, corresponding to the location of the abnormal hypermetabolism seen on the previous PET-CT. Imaging features compatible with the patient's known neoplasm and there is associated mild intrahepatic biliary duct dilatation mainly in segments VII and VIII. 2. Minimal gallbladder wall thickening versus trace pericholecystic fluid. 3. No evidence for lymphadenopathy in the abdomen or pelvis. 4. Left colonic diverticulosis without diverticulitis. 5.  Aortic Atherosclerois (ICD10-170.0)     REVIEW OF SYSTEMS:   Constitutional: ( - ) fevers, ( - )  chills , ( - ) night sweats Eyes: ( - ) blurriness of vision, ( - ) double vision, ( - ) watery eyes Ears, nose, mouth, throat, and face: ( - ) mucositis, ( - ) sore throat Respiratory: ( - ) cough, ( - ) dyspnea, ( - ) wheezes Cardiovascular: ( - ) palpitation, ( - ) chest discomfort, ( - ) lower extremity swelling Gastrointestinal:  ( - ) nausea, ( - ) heartburn, ( - ) change in bowel habits Skin: ( - ) abnormal skin rashes Lymphatics: ( - ) new lymphadenopathy, ( - ) easy bruising Neurological: ( - ) numbness, ( - ) tingling, ( - ) new weaknesses Behavioral/Psych: ( - ) mood change, ( - ) new changes  All other systems were reviewed with the patient and are negative.  I have reviewed the past medical history, past surgical history, social history and  family history with the patient and they are unchanged from previous note.  ALLERGIES:  is allergic to sulfonamide derivatives.  MEDICATIONS:  Current Outpatient Medications  Medication Sig Dispense Refill  . allopurinol (ZYLOPRIM) 100 MG tablet TAKE ONE TABLET BY MOUTH ON MONDAY, WEDNESDAY, AND FRIDAY (Patient taking differently: Take 100 mg by mouth 3 (three) times a week. ) 36 tablet 3  . ALPRAZolam (NIRAVAM) 0.25 MG dissolvable tablet Take 0.25 mg by mouth at bedtime as needed for anxiety.    Marland Kitchen aspirin 325 MG tablet Take 325 mg by mouth daily.      . betamethasone dipropionate (DIPROLENE) 0.05 % cream Apply 1 application topically daily.     . ferrous sulfate 325 (65 FE) MG EC tablet Take 325 mg by mouth daily with breakfast.    . finasteride (PROSCAR) 5 MG tablet TAKE ONE TABLET BY MOUTH EVERY DAY 90 tablet 0  . Lancets (ONETOUCH ULTRASOFT) lancets Use to check blood sugar once daily 100 each 12  . lisinopril (PRINIVIL,ZESTRIL) 5 MG tablet TAKE ONE TABLET BY MOUTH DAILY (Patient taking  differently: Take 5 mg by mouth daily. ) 90 tablet 1  . metFORMIN (GLUCOPHAGE-XR) 500 MG 24 hr tablet TAKE ONE TABLET BY MOUTH TWICE DAILY WITH MEALS (Patient taking differently: Take 500 mg by mouth 2 (two) times daily with a meal. ) 180 tablet 1  . metoprolol tartrate (LOPRESSOR) 25 MG tablet TAKE ONE TABLET BY MOUTH TWICE DAILY (Patient taking differently: Take 25 mg by mouth 2 (two) times daily. ) 60 tablet 5  . MULTIPLE VITAMIN PO Take 1 tablet by mouth daily.     Marland Kitchen omeprazole (PRILOSEC) 40 MG capsule Take 1 capsule (40 mg total) by mouth daily. 30 capsule 3  . PREVIDENT 5000 PLUS 1.1 % CREA dental cream Place 1 application onto teeth at bedtime.     . rosuvastatin (CRESTOR) 10 MG tablet TAKE ONE TABLET BY MOUTH EVERY DAY (Patient taking differently: Take 10 mg by mouth daily. ) 90 tablet 0  . tamsulosin (FLOMAX) 0.4 MG CAPS capsule TAKE ONE CAPSULE BY MOUTH DAILY 90 capsule 3  . Trospium Chloride 60 MG  CP24 TAKE ONE CAPSULE BY MOUTH DAILY 90 capsule 1  . vitamin B-12 (CYANOCOBALAMIN) 1000 MCG tablet Take 1,000 mcg by mouth daily.    . nitroGLYCERIN (NITROSTAT) 0.4 MG SL tablet Place 1 tablet (0.4 mg total) under the tongue every 5 (five) minutes as needed for chest pain. (Patient not taking: Reported on 05/21/2019) 25 tablet 3   No current facility-administered medications for this visit.     PHYSICAL EXAMINATION: ECOG PERFORMANCE STATUS: 3 - Symptomatic, >50% confined to bed  Today's Vitals   07/17/19 1112  BP: (!) 111/44  Pulse: (!) 56  Resp: 19  Temp: 97.6 F (36.4 C)  TempSrc: Temporal  SpO2: 100%  Weight: 159 lb 12.8 oz (72.5 kg)  Height: 5' 5"  (1.651 m)  PainSc: 0-No pain   Body mass index is 26.59 kg/m.  Filed Weights   07/17/19 1112  Weight: 159 lb 12.8 oz (72.5 kg)    GENERAL: alert, no distress and comfortable, thin, frail  SKIN: skin color, texture, turgor are normal, no rashes or significant lesions EYES: conjunctiva are pink and non-injected, sclera clear OROPHARYNX: no exudate, no erythema; lips, buccal mucosa, and tongue normal  NECK: supple, non-tender LYMPH:  no palpable lymphadenopathy in the cervical LUNGS: clear to auscultation with normal breathing effort HEART: regular rate & rhythm and no murmurs and no lower extremity edema ABDOMEN: soft, non-tender, non-distended, normal bowel sounds Musculoskeletal: no cyanosis of digits and no clubbing  PSYCH: alert, slowed speech  LABORATORY DATA:  I have reviewed the data as listed    Component Value Date/Time   NA 139 07/17/2019 1018   K 4.2 07/17/2019 1018   CL 104 07/17/2019 1018   CO2 26 07/17/2019 1018   GLUCOSE 124 (H) 07/17/2019 1018   BUN 16 07/17/2019 1018   CREATININE 1.09 07/17/2019 1018   CREATININE 1.03 02/08/2017 1631   CALCIUM 8.7 (L) 07/17/2019 1018   PROT 6.3 (L) 07/17/2019 1018   ALBUMIN 3.7 07/17/2019 1018   AST 13 (L) 07/17/2019 1018   ALT 10 07/17/2019 1018   ALKPHOS 101  07/17/2019 1018   BILITOT 0.5 07/17/2019 1018   GFRNONAA >60 07/17/2019 1018   GFRAA >60 07/17/2019 1018    No results found for: SPEP, UPEP  Lab Results  Component Value Date   WBC 8.5 07/17/2019   NEUTROABS 6.1 07/17/2019   HGB 12.4 (L) 07/17/2019   HCT 37.8 (L) 07/17/2019  MCV 104.4 (H) 07/17/2019   PLT 198 07/17/2019      Chemistry      Component Value Date/Time   NA 139 07/17/2019 1018   K 4.2 07/17/2019 1018   CL 104 07/17/2019 1018   CO2 26 07/17/2019 1018   BUN 16 07/17/2019 1018   CREATININE 1.09 07/17/2019 1018   CREATININE 1.03 02/08/2017 1631      Component Value Date/Time   CALCIUM 8.7 (L) 07/17/2019 1018   ALKPHOS 101 07/17/2019 1018   AST 13 (L) 07/17/2019 1018   ALT 10 07/17/2019 1018   BILITOT 0.5 07/17/2019 1018       RADIOGRAPHIC STUDIES: I have personally reviewed the radiological images as listed below and agreed with the findings in the report. Ct Abdomen Pelvis W Wo Contrast  Result Date: 07/09/2019 CLINICAL DATA:  Intrahepatic cholangiocarcinoma of the right hepatic lobe with ductal dilatation previously identified in segment VIII. EXAM: CT ABDOMEN AND PELVIS WITHOUT AND WITH CONTRAST TECHNIQUE: Multidetector CT imaging of the abdomen and pelvis was performed following the standard protocol before and following the bolus administration of intravenous contrast. CONTRAST:  154m OMNIPAQUE IOHEXOL 350 MG/ML SOLN COMPARISON:  MRI 06/24/2019.  PET-CT 05/18/2019. FINDINGS: Lower chest: Unremarkable. Hepatobiliary: Multiphase imaging of the liver shows no obvious lesion in the central right liver to explain the mild intrahepatic biliary duct dilatation seen mainly in segments VII and VIII on the current study. There is some subtle arterial phase enhancement in the central right liver seen on arterial phase imaging (axial 35/series 4) which becomes hypoattenuating compared to liver parenchyma on more delayed imaging (33/8). This region corresponds to the  hypermetabolic disease seen on the previous PET-CT and is compatible with the patient's known neoplasm. Common bile duct stent is visualized in situ and trace pneumobilia in the left hepatic lobe is compatible with the presence of the stent device. Mild gallbladder wall thickening versus pericholecystic fluid evident. Pancreas: No focal mass lesion. No dilatation of the main duct. No intraparenchymal cyst. No peripancreatic edema. Spleen: No splenomegaly. No focal mass lesion. Adrenals/Urinary Tract: No adrenal nodule or mass. Kidneys unremarkable. Stomach/Bowel: Stomach is unremarkable. No gastric wall thickening. No evidence of outlet obstruction. Duodenum is normally positioned as is the ligament of Treitz. No small bowel wall thickening. No small bowel dilatation. The terminal ileum is normal. The appendix is not visualized, but there is no edema or inflammation in the region of the cecum. No gross colonic mass. No colonic wall thickening. Diverticular changes are noted in the left colon without evidence of diverticulitis. Vascular/Lymphatic: Normal hepatic arterial branch anatomy. Celiac axis and SMA are patent. IMA opacifies normally. Portal vein, superior mesenteric vein, and splenic vein are patent. Hepatic veins opacify normally. There is no gastrohepatic or hepatoduodenal ligament lymphadenopathy. No intraperitoneal or retroperitoneal lymphadenopathy. No pelvic sidewall lymphadenopathy. Reproductive: The prostate gland and seminal vesicles are unremarkable. Other: No intraperitoneal free fluid. Musculoskeletal: No worrisome lytic or sclerotic osseous abnormality. IMPRESSION: 1. Ill-defined subtle area of differential perfusion identified in the central right liver, corresponding to the location of the abnormal hypermetabolism seen on the previous PET-CT. Imaging features compatible with the patient's known neoplasm and there is associated mild intrahepatic biliary duct dilatation mainly in segments VII and  VIII. 2. Minimal gallbladder wall thickening versus trace pericholecystic fluid. 3. No evidence for lymphadenopathy in the abdomen or pelvis. 4. Left colonic diverticulosis without diverticulitis. 5.  Aortic Atherosclerois (ICD10-170.0) Electronically Signed   By: EMisty StanleyM.D.   On:  07/09/2019 16:24   Mr 3d Recon At Scanner  Result Date: 06/25/2019 CLINICAL DATA:  Abn liver function tests (LFTs) Abd pain, extrahepatic cholangiocarcinoma suspected16m of gadavist. 84year old male with presumed cholangiocarcinoma, having been monitored by imaging over the last year. Now presenting with abdominal pain, significantly elevated liver enzymes and leukocytosis. No fever. EXAM: MRI ABDOMEN WITHOUT AND WITH CONTRAST (INCLUDING MRCP) TECHNIQUE: Multiplanar multisequence MR imaging of the abdomen was performed both before and after the administration of intravenous contrast. Heavily T2-weighted images of the biliary and pancreatic ducts were obtained, and three-dimensional MRCP images were rendered by post processing. CONTRAST:  Seven mL Gadavist COMPARISON:  CT 06/23/2019, MRI 05/03/2018, PET-CT 05/18/2019 FINDINGS: Lower chest:  Small bilateral pleural effusions. Hepatobiliary: There is mild duct dilatation within the RIGHT hepatic lobe (segment 8/6) which is similar to comparison MRI 05/03/2018 (image 19/1)7. There is minimal contrast enhancement through this region of obstruction. Lesion is better identified on the comparison FDG PET scan. The MRCP sequence do demonstrate interruption of the ducts in the same segment. The distal common bile duct is normal caliber. No new lesions identified. Pancreas: Normal pancreatic parenchymal intensity. No ductal dilatation or inflammation. Spleen: Normal spleen. Adrenals/urinary tract: Adrenal glands and kidneys are normal. Stomach/Bowel: Stomach and limited of the small bowel is unremarkable Vascular/Lymphatic: Abdominal aortic normal caliber. No retroperitoneal periportal  lymphadenopathy. Musculoskeletal: No aggressive osseous lesion IMPRESSION: 1. Persistent duct dilatation in segment 8/6 of the RIGHT hepatic lobe with suspicion of region central obstruction identified by hypermetabolic tissue on comparison FDG PET scan. The MRI findings are very similar to MRI of 05/03/2018. The tumor is very poorly demonstrated on current exam. There is some limitation to the imaging due to patient body motion. 2. Normal distal common bile duct.  Normal pancreas. Electronically Signed   By: SSuzy BouchardM.D.   On: 06/25/2019 07:57   Dg Chest Port 1 View  Result Date: 06/23/2019 CLINICAL DATA:  Chest pain, back pain, constipation for 1 day EXAM: PORTABLE CHEST 1 VIEW COMPARISON:  Radiograph May 05, 2011 FINDINGS: Postsurgical changes related to prior CABG including intact and aligned sternotomy wires and multiple surgical clips projecting over the mediastinum. Cardiac silhouette is borderline enlarged linear opacities in the right lung base likely reflect subsegmental atelectasis no consolidation, features of edema, pneumothorax, or effusion. Pulmonary vascularity is normally distributed. No acute osseous or soft tissue abnormality. Degenerative changes are present in the and imaged spine and shoulders. Cardiac leads overlie the chest. IMPRESSION: No acute cardiopulmonary abnormality Electronically Signed   By: PLovena LeM.D.   On: 06/23/2019 17:04   Dg Ercp Biliary & Pancreatic Ducts  Result Date: 06/26/2019 CLINICAL DATA:  Obstructive jaundice. ERCP with biliary stent placement. EXAM: ERCP TECHNIQUE: Multiple spot images obtained with the fluoroscopic device and submitted for interpretation post-procedure. COMPARISON:  CT the chest, abdomen pelvis-06/23/2019; MRCP-06/24/2019 FINDINGS: 13 spot intraoperative fluoroscopic images of the right upper abdominal quadrant during ERCP are provided for review Initial image demonstrates an ERCP probe overlying the right upper abdominal  quadrant. Subsequent images demonstrate selective cannulation opacification of the common bile duct. Subsequent images demonstrate insufflation of a balloon within the central aspect of the CBD with subsequent biliary sweeping and presumed sphincterotomy with additional images demonstrate biliary plasty at the level of the ampulla (image labeled 11). There is minimal opacification of the intrahepatic biliary tree which appears mildly dilated. There is minimal opacification of the cystic duct with minimal passage of contrast into the gallbladder lumen. There is  no definitive opacification of the pancreatic duct. Completion image demonstrates placement of an internal plastic biliary stent. IMPRESSION: ERCP with biliary sweeping, sphincterotomy / biliary plasty and internal biliary stent placement as above. These images were submitted for radiologic interpretation only. Please see the procedural report for the amount of contrast and the fluoroscopy time utilized. Electronically Signed   By: Sandi Mariscal M.D.   On: 06/26/2019 13:13   Mr Abdomen Mrcp Moise Boring Contast  Result Date: 06/25/2019 CLINICAL DATA:  Abn liver function tests (LFTs) Abd pain, extrahepatic cholangiocarcinoma suspected1m of gadavist. 84year old male with presumed cholangiocarcinoma, having been monitored by imaging over the last year. Now presenting with abdominal pain, significantly elevated liver enzymes and leukocytosis. No fever. EXAM: MRI ABDOMEN WITHOUT AND WITH CONTRAST (INCLUDING MRCP) TECHNIQUE: Multiplanar multisequence MR imaging of the abdomen was performed both before and after the administration of intravenous contrast. Heavily T2-weighted images of the biliary and pancreatic ducts were obtained, and three-dimensional MRCP images were rendered by post processing. CONTRAST:  Seven mL Gadavist COMPARISON:  CT 06/23/2019, MRI 05/03/2018, PET-CT 05/18/2019 FINDINGS: Lower chest:  Small bilateral pleural effusions. Hepatobiliary: There is  mild duct dilatation within the RIGHT hepatic lobe (segment 8/6) which is similar to comparison MRI 05/03/2018 (image 19/1)7. There is minimal contrast enhancement through this region of obstruction. Lesion is better identified on the comparison FDG PET scan. The MRCP sequence do demonstrate interruption of the ducts in the same segment. The distal common bile duct is normal caliber. No new lesions identified. Pancreas: Normal pancreatic parenchymal intensity. No ductal dilatation or inflammation. Spleen: Normal spleen. Adrenals/urinary tract: Adrenal glands and kidneys are normal. Stomach/Bowel: Stomach and limited of the small bowel is unremarkable Vascular/Lymphatic: Abdominal aortic normal caliber. No retroperitoneal periportal lymphadenopathy. Musculoskeletal: No aggressive osseous lesion IMPRESSION: 1. Persistent duct dilatation in segment 8/6 of the RIGHT hepatic lobe with suspicion of region central obstruction identified by hypermetabolic tissue on comparison FDG PET scan. The MRI findings are very similar to MRI of 05/03/2018. The tumor is very poorly demonstrated on current exam. There is some limitation to the imaging due to patient body motion. 2. Normal distal common bile duct.  Normal pancreas. Electronically Signed   By: SSuzy BouchardM.D.   On: 06/25/2019 07:57   Ct Angio Chest/abd/pel For Dissection W And/or Wo Contrast  Result Date: 06/23/2019 CLINICAL DATA:  Chest and back pain for 1 day, no known injury, initial encounter EXAM: CT ANGIOGRAPHY CHEST, ABDOMEN AND PELVIS TECHNIQUE: Multidetector CT imaging through the chest, abdomen and pelvis was performed using the standard protocol during bolus administration of intravenous contrast. Multiplanar reconstructed images and MIPs were obtained and reviewed to evaluate the vascular anatomy. CONTRAST:  1031mOMNIPAQUE IOHEXOL 350 MG/ML SOLN COMPARISON:  None. FINDINGS: CTA CHEST FINDINGS Cardiovascular: Thoracic aorta demonstrates bovine  branching anatomy. Mild atherosclerotic changes are seen without aneurysmal dilatation or dissection. Changes of prior coronary bypass grafting are seen. No cardiac enlargement is noted. Pulmonary artery is prominent and shows no definitive central pulmonary embolus although timing was not performed for embolus evaluation. Coronary calcifications are noted. No pericardial effusion is noted. Mediastinum/Nodes: Thoracic inlet is within normal limits. No hilar or mediastinal adenopathy is noted. No mediastinal hematoma is seen. The esophagus as visualized is within normal limits. Lungs/Pleura: The lungs are well aerated bilaterally. No focal infiltrate or sizable effusion is seen. Mild bibasilar atelectatic changes are noted. No sizable parenchymal nodules are seen. Musculoskeletal: Degenerative changes of the thoracic spine are noted. No acute  rib abnormality is seen. Prior median sternotomy is noted. Review of the MIP images confirms the above findings. CTA ABDOMEN AND PELVIS FINDINGS VASCULAR Aorta: The abdominal aorta demonstrates atherosclerotic calcifications without aneurysmal dilatation or focal dissection. No extravasation is seen. Celiac: Calcifications are noted at the origin of the celiac axis although no focal stenosis is noted. SMA: Calcifications are noted at the origin of the superior mesenteric artery although no focal stenosis is seen. Renals: Single renal arteries are identified bilaterally with mild narrowing at the origins. IMA: Patent without evidence of aneurysm, dissection, vasculitis or significant stenosis. Iliacs: Iliacs are widely patent without aneurysmal dilatation or dissection. Veins: No vein abnormality is noted. Review of the MIP images confirms the above findings. NON-VASCULAR Hepatobiliary: Liver demonstrates changes of mild fatty infiltration. Mild segmental biliary dilatation is again noted similar to that seen on the prior exam. The previously seen central hypodense lesion is  again identified but stable. No significant increase in biliary dilatation is noted. The gallbladder is unremarkable. Pancreas: Unremarkable. No pancreatic ductal dilatation or surrounding inflammatory changes. Spleen: Normal in size without focal abnormality. Adrenals/Urinary Tract: Adrenal glands are within normal limits. Kidneys demonstrate a normal enhancement pattern bilaterally. No obstructive changes are seen. The bladder is well distended. Stomach/Bowel: Diverticular change of the colon is noted. Diffuse wall thickening is noted within the rectosigmoid region consistent with focal colitis. No perforation or abscess is noted. No significant pericolonic inflammatory changes are seen. The appendix is well visualized and within normal limits. No small bowel or gastric abnormality is noted. Lymphatic: No significant lymphadenopathy is noted. Reproductive: Prostate is unremarkable. Other: No abdominal wall hernia or abnormality. No abdominopelvic ascites. Musculoskeletal: Degenerative changes of lumbar spine are noted. No compression deformity is seen. Review of the MIP images confirms the above findings. IMPRESSION: No evidence of aneurysmal dilatation or dissection within the thoracic and abdominal aorta. Stable segmental biliary dilatation within segment 8 of the liver stable from the prior exam. Mild decreased attenuation is noted centrally similar to that seen on prior MRI. No significant enlargement is noted. Wall thickening within the rectosigmoid consistent with focal colitis. No abscess or perforation is noted. Chronic changes as described above. Electronically Signed   By: Inez Catalina M.D.   On: 06/23/2019 19:54

## 2019-07-21 ENCOUNTER — Ambulatory Visit (INDEPENDENT_AMBULATORY_CARE_PROVIDER_SITE_OTHER): Payer: Medicare Other

## 2019-07-21 ENCOUNTER — Other Ambulatory Visit: Payer: Self-pay

## 2019-07-21 DIAGNOSIS — Z23 Encounter for immunization: Secondary | ICD-10-CM | POA: Diagnosis not present

## 2019-07-23 ENCOUNTER — Ambulatory Visit: Payer: Medicare Other | Admitting: Hematology

## 2019-07-23 ENCOUNTER — Other Ambulatory Visit: Payer: Medicare Other

## 2019-07-29 ENCOUNTER — Other Ambulatory Visit: Payer: Self-pay

## 2019-07-29 ENCOUNTER — Encounter: Payer: Self-pay | Admitting: *Deleted

## 2019-07-29 ENCOUNTER — Ambulatory Visit
Admission: RE | Admit: 2019-07-29 | Discharge: 2019-07-29 | Disposition: A | Discharge status: Home / Self Care | Payer: Medicare Other | Source: Ambulatory Visit | Attending: Hematology | Admitting: Hematology

## 2019-07-29 DIAGNOSIS — C221 Intrahepatic bile duct carcinoma: Secondary | ICD-10-CM | POA: Diagnosis not present

## 2019-07-29 HISTORY — PX: IR RADIOLOGIST EVAL & MGMT: IMG5224

## 2019-07-29 NOTE — Progress Notes (Signed)
Patient ID: Ricardo Washington, male   DOB: 1935/09/17, 84 y.o.   MRN: [id number]       Chief Complaint: Patient was consulted remotely today ([facility]) for cholangiocarcinoma at the request of [place].    Referring Physician(s): Zhao,Yan  History of Present Illness: Ricardo Washington is a 84 y.o. 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  He recently was admitted to the hospital with abdominal pain.  He underwent ERCP with endoscopic stent placement and bile duct biopsy.  Biopsy was positive for invasive adenocarcinoma compatible with intrahepatic cholangiocarcinoma.  ERCP procedure was performed 06/26/2019.  Since discharge she has been doing very well.  Stable weight and appetite.  No significant recurrent abdominal pain or symptoms.  No signs of liver failure or jaundice.  Past Medical History:  Diagnosis Date   BPH with obstruction/lower urinary tract symptoms 10/27/2015   Dr. Karsten Ro   Cancer [facility])    Cataract    Colon wall thickening 04/2018   "mass-like" per radiologist interpretation; colonoscopy as next step---f/u colonoscopy showed 'tics but o/w normal.  NO further colonoscopies needed due to age.   Coronary artery disease    with preserved LV function.   Dementia ([place])    Diabetes mellitus without complication ([place])    Diverticulosis of colon    severe, entire colon   Dysphagia 11/2018   Barium swallow: mild esophageal dysmotility.   Ectatic abdominal aorta ([place]) 01/2017   Abd u/s: 2.9 cm aortic ectasia--at risk for aneurism development.  Recheck aortic u/s 5 yrs.   Erectile dysfunction due to arterial insufficiency    Fatigue    Gout    always 2nd toe L foot (uric acid 6.20 Dec 2014 per old records)   Hepatic steatosis    History of adenomatous polyp of colon 2002   History of stomach ulcers    Hyperlipidemia    Hypertension    Hypogonadism male    Klebsiella sepsis ([place])  01/2017   due to acute biliary tract infection (no stones) and acute diverticulitis.   Liver mass 04/2018   Bx 06/2019: probable cholangiocarcinoma ->with biliary obstruction.   Macrocytic anemia 01/2017   vit B12 borderline low and iron borderline low: checking hemoccults and starting vit B12 PO and iron PO as of 02/11/17.  Vit B12 and folate normal as of GI f/u 06/2018.   Myogenic ptosis of bilateral eyelids 2018   Plastic surgery in [place], [place] to do surg as of 03/2017.   NASH (nonalcoholic steatohepatitis) 06/2017   LFTs up, abd u/s showed fatty liver but no other abnormality.   Osteoarthritis of right shoulder 09/2018   Near end-stage --->glenohumeral joint-->intra-articular steroid injection 09/2018 (Dr. Delilah Shan).   Osteoarthritis of right shoulder    11/2018-->end stage, but not ready for total shoulder replacement.   Past use of tobacco    quit 1984   Rectal bleeding    Rosacea    S/P coronary artery bypass graft x 3    Urine incontinence    Dr. Karsten Ro    Past Surgical History:  Procedure Laterality Date   BILIARY BRUSHING  06/26/2019   PATH: invasive adenocarcinoma.  Procedure: BILIARY BRUSHING;  Surgeon: Rush Landmark Telford Nab., MD;  Location: [facility];  Service: Gastroenterology;;   BILIARY DILATION  06/26/2019   Procedure: BILIARY DILATION;  Surgeon: Irving Copas., MD;  Location: [facility];  Service: Gastroenterology;;   BILIARY STENT PLACEMENT  06/26/2019   Procedure:  BILIARY STENT PLACEMENT;  Surgeon: Rush Landmark Telford Nab., MD;  Location: [facility];  Service: Gastroenterology;;   CARDIOVASCULAR STRESS TEST  08/28/2016   Low risk myoview, normal EF, no ischemia.   CATARACT EXTRACTION, BILATERAL     COLONOSCOPY  2002, 2006, 02/25/2009; 06/11/18   Polyp x 1 2002, none 2006 or 2010.  Adenomatous polyp 06/11/18+  signif diverticulosis.  NO FURTHER COLONOSCOPIES DUE TO AGE.   CORONARY ARTERY BYPASS GRAFT  06/2009   descending,saphenous vein graft to  first obtuse marginal, sequential saphenous vein graft to posterior descending and posterolateral   Endoscopic vein harvest right thigh     ERCP N/A 06/26/2019   Procedure: ENDOSCOPIC RETROGRADE CHOLANGIOPANCREATOGRAPHY (ERCP);  Surgeon: Irving Copas., MD;  Location: [facility];  Service: Gastroenterology;  Laterality: N/A;  with stent    IR RADIOLOGIST EVAL & MGMT  06/12/2018   IR RADIOLOGIST EVAL & MGMT  09/18/2018   IR RADIOLOGIST EVAL & MGMT  05/26/2019   PTCA     REMOVAL OF STONES  06/26/2019   Procedure: REMOVAL OF STONES;  Surgeon: Irving Copas., MD;  Location: [facility];  Service: Gastroenterology;;   Joan Mayans  06/26/2019   Procedure: Joan Mayans;  Surgeon: Irving Copas., MD;  Location: [facility];  Service: Gastroenterology;;   TONSILLECTOMY     UMBILICAL HERNIA REPAIR     2009   VASECTOMY      Allergies: Sulfonamide derivatives  Medications: Prior to Admission medications   Medication Sig Start Date End Date Taking? Authorizing Provider  allopurinol (ZYLOPRIM) 100 MG tablet TAKE ONE TABLET BY MOUTH ON MONDAY, WEDNESDAY, AND FRIDAY Patient taking differently: Take 100 mg by mouth 3 (three) times a week.  01/26/19   McGowen, Adrian [place], MD  ALPRAZolam (NIRAVAM) 0.25 MG dissolvable tablet Take 0.25 mg by mouth at bedtime as needed for anxiety.    [provider]  aspirin 325 MG tablet Take 325 mg by mouth daily.      [provider]  betamethasone dipropionate (DIPROLENE) 0.05 % cream Apply 1 application topically daily.  12/17/18   [provider]  ferrous sulfate 325 (65 FE) MG EC tablet Take 325 mg by mouth daily with breakfast.    [provider]  finasteride (PROSCAR) 5 MG tablet TAKE ONE TABLET BY MOUTH EVERY DAY 05/04/19   McGowen, Adrian [place], MD  Lancets [facility] ULTRASOFT) lancets Use to check blood sugar once daily 04/15/18   McGowen, Adrian [place], MD  lisinopril (PRINIVIL,ZESTRIL) 5 MG tablet  TAKE ONE TABLET BY MOUTH DAILY Patient taking differently: Take 5 mg by mouth daily.  01/26/19   McGowen, Adrian [place], MD  metFORMIN (GLUCOPHAGE-XR) 500 MG 24 hr tablet TAKE ONE TABLET BY MOUTH TWICE DAILY WITH MEALS Patient taking differently: Take 500 mg by mouth 2 (two) times daily with a meal.  06/22/19   McGowen, Adrian [place], MD  metoprolol tartrate (LOPRESSOR) 25 MG tablet TAKE ONE TABLET BY MOUTH TWICE DAILY Patient taking differently: Take 25 mg by mouth 2 (two) times daily.  06/22/19   McGowen, Adrian [place], MD  MULTIPLE VITAMIN PO Take 1 tablet by mouth daily.     [provider]  nitroGLYCERIN (NITROSTAT) 0.4 MG SL tablet Place 1 tablet (0.4 mg total) under the tongue every 5 (five) minutes as needed for chest pain. Patient not taking: Reported on 05/21/2019 08/20/16 10/20/18  Josue Hector, MD  omeprazole (PRILOSEC) 40 MG capsule Take 1 capsule (40 mg total) by mouth daily. 05/07/19  McGowen, Adrian [place], MD  PREVIDENT 5000 PLUS 1.1 % CREA dental cream Place 1 application onto teeth at bedtime.  10/27/18   [provider]  rosuvastatin (CRESTOR) 10 MG tablet TAKE ONE TABLET BY MOUTH EVERY DAY Patient taking differently: Take 10 mg by mouth daily.  05/04/19   McGowen, Adrian [place], MD  tamsulosin (FLOMAX) 0.4 MG CAPS capsule TAKE ONE CAPSULE BY MOUTH DAILY 03/09/19   McGowen, Adrian [place], MD  Trospium Chloride 60 MG CP24 TAKE ONE CAPSULE BY MOUTH DAILY 06/22/19   McGowen, Adrian [place], MD  vitamin B-12 (CYANOCOBALAMIN) 1000 MCG tablet Take 1,000 mcg by mouth daily.    [provider]     Family History  Problem Relation Age of Onset   Cancer Mother    Cancer Father        pt points to LLQ as  area of cancer, so potentially could have been intestinal.      Colon cancer Neg Hx    Esophageal cancer Neg Hx    Stomach cancer Neg Hx    Rectal cancer Neg Hx     Social History   Socioeconomic History   Marital status: Married    Spouse name: Not on file   Number of children: 3    Years of education: 16   Highest education level: Not on file  Occupational History   Not on file  Social Needs   Financial resource strain: Not on file   Food insecurity    Worry: Not on file    Inability: Not on file   Transportation needs    Medical: Not on file    Non-medical: Not on file  Tobacco Use   Smoking status: Former Smoker    Packs/day: 1.00    Years: 30.00    Pack years: 30.00    Types: Cigarettes    Quit date: 03/15/1983    Years since quitting: 36.3   Smokeless tobacco: Never Used   Tobacco comment: Quit 1984  Substance and Sexual Activity   Alcohol use: Yes    Alcohol/week: 5.0 - 7.0 standard drinks    Types: 5 - 7 Glasses of wine per week    Comment: quit etoh 1999 but in ~ 2017 began having a glass of wine with dinner    Drug use: No   Sexual activity: Never  Lifestyle   Physical activity    Days per week: Not on file    Minutes per session: Not on file   Stress: Not on file  Relationships   Social connections    Talks on phone: Not on file    Gets together: Not on file    Attends religious service: Not on file    Active member of club or organization: Not on file    Attends meetings of clubs or organizations: Not on file    Relationship status: Not on file  Other Topics Concern   Not on file  Social History Narrative   Married 1957, has 3 sons.   [place]. 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    End of life Care: DNR, no prolonged heroic measures or prolonged supportive care.    Former smoker, quit 1984.   Quit alcohol when dx'd with DM in 2009. ~ 2017 began having glass of wine with dinner.   No exercise.    ECOG Status: 3 - Symptomatic, >50% confined to bed  Review of Systems  Review of  Systems: A 12 point ROS discussed and pertinent positives are indicated in the HPI above.  All other systems are negative.  Physical Exam No direct physical exam was  performed (except for noted visual exam findings with Video Visits).  Video telehealth visit today.  Alert, no distress, elderly frail male  Clear sclera, no signs of jaundice  Stable slowed speech but appears to comprehend. Vital Signs: There were no vitals taken for this visit.  Imaging: Ct Abdomen Pelvis W Wo Contrast  Result Date: 07/09/2019 CLINICAL DATA:  Intrahepatic cholangiocarcinoma of the right hepatic lobe with ductal dilatation previously identified in segment VIII. EXAM: CT ABDOMEN AND PELVIS WITHOUT AND WITH CONTRAST TECHNIQUE: Multidetector CT imaging of the abdomen and pelvis was performed following the standard protocol before and following the bolus administration of intravenous contrast. CONTRAST:  154m OMNIPAQUE IOHEXOL 350 MG/ML SOLN COMPARISON:  MRI 06/24/2019.  PET-CT 05/18/2019. FINDINGS: Lower chest: Unremarkable. Hepatobiliary: Multiphase imaging of the liver shows no obvious lesion in the central right liver to explain the mild intrahepatic biliary duct dilatation seen mainly in segments VII and VIII on the current study. There is some subtle arterial phase enhancement in the central right liver seen on arterial phase imaging (axial 35/series 4) which becomes hypoattenuating compared to liver parenchyma on more delayed imaging (33/8). This region corresponds to the hypermetabolic disease seen on the previous PET-CT and is compatible with the patient's known neoplasm. Common bile duct stent is visualized in situ and trace pneumobilia in the left hepatic lobe is compatible with the presence of the stent device. Mild gallbladder wall thickening versus pericholecystic fluid evident. Pancreas: No focal mass lesion. No dilatation of the main duct. No intraparenchymal cyst. No peripancreatic edema. Spleen: No splenomegaly. No focal mass lesion. Adrenals/Urinary Tract: No adrenal nodule or mass. Kidneys unremarkable. Stomach/Bowel: Stomach is unremarkable. No gastric wall  thickening. No evidence of outlet obstruction. Duodenum is normally positioned as is the ligament of Treitz. No small bowel wall thickening. No small bowel dilatation. The terminal ileum is normal. The appendix is not visualized, but there is no edema or inflammation in the region of the cecum. No gross colonic mass. No colonic wall thickening. Diverticular changes are noted in the left colon without evidence of diverticulitis. Vascular/Lymphatic: Normal hepatic arterial branch anatomy. Celiac axis and SMA are patent. IMA opacifies normally. Portal vein, superior mesenteric vein, and splenic vein are patent. Hepatic veins opacify normally. There is no gastrohepatic or hepatoduodenal ligament lymphadenopathy. No intraperitoneal or retroperitoneal lymphadenopathy. No pelvic sidewall lymphadenopathy. Reproductive: The prostate gland and seminal vesicles are unremarkable. Other: No intraperitoneal free fluid. Musculoskeletal: No worrisome lytic or sclerotic osseous abnormality. IMPRESSION: 1. Ill-defined subtle area of differential perfusion identified in the central right liver, corresponding to the location of the abnormal hypermetabolism seen on the previous PET-CT. Imaging features compatible with the patient's known neoplasm and there is associated mild intrahepatic biliary duct dilatation mainly in segments VII and VIII. 2. Minimal gallbladder wall thickening versus trace pericholecystic fluid. 3. No evidence for lymphadenopathy in the abdomen or pelvis. 4. Left colonic diverticulosis without diverticulitis. 5.  Aortic Atherosclerois (ICD10-170.0) Electronically Signed   By: EMisty StanleyM.D.   On: 07/09/2019 16:24    Labs:  CBC: Recent Labs    06/28/19 1632 06/29/19 0610 07/03/19 0849 07/17/19 1018  WBC 6.3 6.6 8.7 8.5  HGB 12.4* 12.9* 12.5* 12.4*  HCT 35.4* 38.3* 37.3* 37.8*  PLT 152 163 261 198    COAGS: Recent Labs  06/24/19 0223 06/26/19 0524  INR 1.5* 1.2  APTT 30  --      BMP: Recent Labs    06/28/19 0917 06/29/19 0610 07/03/19 0849 07/17/19 1018  NA 141 142 138 139  K 3.6 3.5 3.8 4.2  CL 109 108 103 104  CO2 23 23 26 26   GLUCOSE 100* 78 101* 124*  BUN 6* 5* 13 16  CALCIUM 8.0* 8.2* 8.6* 8.7*  CREATININE 1.07 1.02 1.04 1.09  GFRNONAA >60 >60 >60 >60  GFRAA >60 >60 >60 >60    LIVER FUNCTION TESTS: Recent Labs    06/28/19 0917 06/29/19 0610 07/03/19 0849 07/17/19 1018  BILITOT 0.7 0.9 0.7 0.5  AST 25 24 15  13*  ALT 108* 86* 36 10  ALKPHOS 135* 141* 132* 101  PROT 5.3* 5.6* 6.3* 6.3*  ALBUMIN 2.5* 2.7* 3.8 3.7    TUMOR MARKERS: Recent Labs    08/28/18 1407  CEA 2.0  CA199 12    Assessment and Plan:  Stage I a intrahepatic cholangiocarcinoma in the right hepatic lobe centrally predominantly within segment 8.  MRI and CT imaging reviewed.  His imaging has been stable for over a year without disease progression or new hepatic lesions.  Interval ERCP procedure with endoscopic stent placement and biopsy which did confirm common bile duct invasive adenocarcinoma.  He is recovered from the ERCP procedure and is back at home.  Given his advanced age, limited performance status, and comorbidities as well as dementia, I would agree with a conservative approach with continued imaging surveillance.  If there is any disease progression, I would not recommend any IR directed therapy including ablation or embolization.  Apparently he is not a candidate for chemotherapy as well.  If there is true disease progression by imaging he could be seen for SBRT.  All questions were addressed.  He was accompanied by his wife on the tele video visit today.  They seem to have a clear understanding.  For further follow-up and surveillance imaging will be performed by Dr. Maylon Peppers.  Thank you for this interesting consult.  I greatly enjoyed meeting Ricardo Washington and look forward to participating in their care.  A copy of this report was sent to the requesting  provider on this date.  Electronically Signed: Greggory Keen 07/29/2019, 10:23 AM   I spent a total of    40 Minutes in remote  clinical consultation, greater than 50% of which was counseling/coordinating care for this patient with cholangiocarcinoma.    Visit type: Audio and video (MyChart video).   Alternative for in-person consultation at [facility], [address], [place], [place]. This visit type was conducted due to national recommendations for restrictions regarding the COVID-19 Pandemic (e.g. social distancing).  This format is felt to be most appropriate for this patient at this time.  All issues noted in this document were discussed and addressed.

## 2019-07-30 ENCOUNTER — Other Ambulatory Visit: Payer: Self-pay | Admitting: Hematology

## 2019-07-30 DIAGNOSIS — C221 Intrahepatic bile duct carcinoma: Secondary | ICD-10-CM

## 2019-08-03 ENCOUNTER — Other Ambulatory Visit: Payer: Self-pay

## 2019-08-03 ENCOUNTER — Other Ambulatory Visit (INDEPENDENT_AMBULATORY_CARE_PROVIDER_SITE_OTHER): Payer: Medicare Other

## 2019-08-03 ENCOUNTER — Ambulatory Visit (INDEPENDENT_AMBULATORY_CARE_PROVIDER_SITE_OTHER): Payer: Medicare Other | Admitting: Gastroenterology

## 2019-08-03 ENCOUNTER — Other Ambulatory Visit: Payer: Self-pay | Admitting: Family

## 2019-08-03 ENCOUNTER — Telehealth: Payer: Self-pay | Admitting: Radiation Oncology

## 2019-08-03 ENCOUNTER — Encounter: Payer: Self-pay | Admitting: Gastroenterology

## 2019-08-03 VITALS — BP 124/62 | HR 63 | Temp 98.1°F | Ht 62.0 in | Wt 158.5 lb

## 2019-08-03 DIAGNOSIS — C221 Intrahepatic bile duct carcinoma: Secondary | ICD-10-CM | POA: Diagnosis not present

## 2019-08-03 DIAGNOSIS — K219 Gastro-esophageal reflux disease without esophagitis: Secondary | ICD-10-CM | POA: Diagnosis not present

## 2019-08-03 LAB — COMPREHENSIVE METABOLIC PANEL
ALT: 10 U/L (ref 0–53)
AST: 14 U/L (ref 0–37)
Albumin: 3.8 g/dL (ref 3.5–5.2)
Alkaline Phosphatase: 84 U/L (ref 39–117)
BUN: 16 mg/dL (ref 6–23)
CO2: 26 mEq/L (ref 19–32)
Calcium: 8.8 mg/dL (ref 8.4–10.5)
Chloride: 105 mEq/L (ref 96–112)
Creatinine, Ser: 1.02 mg/dL (ref 0.40–1.50)
GFR: 69.81 mL/min (ref >60.00)
Glucose, Bld: 124 mg/dL — ABNORMAL HIGH (ref 70–99)
Potassium: 3.9 mEq/L (ref 3.5–5.1)
Sodium: 140 mEq/L (ref 135–145)
Total Bilirubin: 0.5 mg/dL (ref 0.2–1.2)
Total Protein: 6.5 g/dL (ref 6.0–8.3)

## 2019-08-03 LAB — CBC WITH DIFFERENTIAL/PLATELET
Basophils Absolute: 0 10*3/uL (ref 0.0–0.1)
Basophils Relative: 0.6 % (ref 0.0–3.0)
Eosinophils Absolute: 0.2 10*3/uL (ref 0.0–0.7)
Eosinophils Relative: 3.2 % (ref 0.0–5.0)
HCT: 35.9 % — ABNORMAL LOW (ref 39.0–52.0)
Hemoglobin: 12.1 g/dL — ABNORMAL LOW (ref 13.0–17.0)
Lymphocytes Relative: 33.4 % (ref 12.0–46.0)
Lymphs Abs: 2.2 10*3/uL (ref 0.7–4.0)
MCHC: 33.6 g/dL (ref 30.0–36.0)
MCV: 102.8 fl — ABNORMAL HIGH (ref 78.0–100.0)
Monocytes Absolute: 0.5 10*3/uL (ref 0.1–1.0)
Monocytes Relative: 7.8 % (ref 3.0–12.0)
Neutro Abs: 3.6 10*3/uL (ref 1.4–7.7)
Neutrophils Relative %: 55 % (ref 43.0–77.0)
Platelets: 160 10*3/uL (ref 150.0–400.0)
RBC: 3.49 Mil/uL — ABNORMAL LOW (ref 4.22–5.81)
RDW: 15 % (ref 11.5–15.5)
WBC: 6.6 10*3/uL (ref 4.0–10.5)

## 2019-08-03 NOTE — Patient Instructions (Signed)
If you are age 82 or older, your body mass index should be between 23-30. Your Body mass index is 28.99 kg/m. If this is out of the aforementioned range listed, please consider follow up with your Primary Care Provider.  If you are age 89 or younger, your body mass index should be between 19-25. Your Body mass index is 28.99 kg/m. If this is out of the aformentioned range listed, please consider follow up with your Primary Care Provider.    Please go to the lab at [facility] ([place] You will need to go to level "B", you do not need an appointment for this. Hours available are 7:30 am - 4:30 pm.    Thank you,  Dr. Jackquline Denmark

## 2019-08-03 NOTE — Progress Notes (Signed)
IMPRESSION and PLAN:    #1. Asymptomatic Stage 1A central hilar cholangiocarcinoma with R main hepatic duct mild stricture s/p ERCP with 10 Fr 12 cm stent insertion 06/26/2019 (Dr Rush Landmark). No evidence of metastatic disease or extra hepatic disease.  No ascites. Neg CT/PET/MR for progression >1.5 yrs. Nl CA19-9. Not a candidate for any IR directed therapy including ablation/embolization (per IR, Dr Annamaria Boots), not a candidate for chemotherapy (per oncology). #2. Associated mod-severe Alzheimer's dementia.  Plan: - Check CBC, CMP. - Dr Rush Landmark for stent exchange/insertion of metal stent in next 4 to 6 weeks.  I will get in touch with him. - Patient has appointment with Dr Lisbeth Renshaw tomorrow - FU in 12 weeks.       HPI:    Chief Complaint:   Ricardo Washington is a 84 y.o. male  For follow-up visit. Accompanied by his wife. Recently admitted to [facility] with ascending cholangitis.  He underwent ERCP 06/26/2019 which did show choledocholithiasis s/p removal by biliary sphincterotomy and sphincteroplasty.  Also, showed single mild right main hepatic duct stricture.  Bx were consistent with cholangiocarcinoma.  Underwent 10 Fr, 12 cm stent placement in the right hepatic system with good drainage.  Doing well since discharge.  No jaundice, dark urine, fever or chills.  No abdominal pain.  Having increasing memory problems.  For instance, for today's appointment with me, he got ready at 3 AM.  Past GI procedures: Underwent colonoscopy on 06/11/2018-small colonic polyp, extensive diverticulosis. PET 08/20/2018-showed decreased SUV, no increase in size, no new lesions.  PET 08/20/2018: IMPRESSION: 1. Focus of activity centrally within liver remains but is slightly less intense and less focal. No clear evidence of new lesions within the liver. 2. No evidence of metastatic disease outside the liver. 3. Extensive activity in the bowel is physiologic and could mask a colon lesion.   PET:  05/19/2018 IMPRESSION: 1. The corresponding to the MR abnormality, there is a focal area of increased uptake within the central aspect of segment 8 which is suspicious for underlying neoplasm, favor cholangiocarcinoma. 2. No additional areas of increased uptake identified to suggest metastatic disease.  MR 05/03/2018 1. Mild motion degradation. 2. Intrahepatic duct dilatation centered in the periphery of segment 8 with a central area of subtle ductal caught off, probable mild T2 hyperintensity, and delayed post-contrast hypointensity or hypoenhancement. 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 Potential clinical strategies include focused ultrasound to allow possible sampling, PET, or if patient is not a good biopsy or surgical candidate, follow-up pre and post contrast abdominal MRI at 3 months. 3.  Aortic Atherosclerosis (ICD10-I70.0).  [place] 05/20/2018 IMPRESSION: Slight increased echogenicity identified in the area of abnormality on recent MRI and PET-CT although no definitive mass is seen. Some right-sided ductal dilatation is noted similar to that seen on prior MRI.  Past Medical History:  Diagnosis Date  . BPH with obstruction/lower urinary tract symptoms 10/27/2015   Dr. Karsten Ro  . Cancer ([place])   . Cataract   . Colon wall thickening 04/2018   "mass-like" per radiologist interpretation; colonoscopy as next step---f/u colonoscopy showed 'tics but o/w normal.  NO further colonoscopies needed due to age.  . Coronary artery disease    with preserved LV function.  . Dementia ([place])   . Diabetes mellitus without complication ([place])   . Diverticulosis of colon    severe, entire colon  . Dysphagia 11/2018   Barium swallow: mild esophageal dysmotility.  . Ectatic abdominal aorta ([place]) 01/2017  Abd u/s: 2.9 cm aortic ectasia--at risk for aneurism development.  Recheck aortic u/s 5 yrs.  . Erectile dysfunction due to arterial insufficiency   . Fatigue   . Gout     always 2nd toe L foot (uric acid 6.20 Dec 2014 per old records)  . Hepatic steatosis   . History of adenomatous polyp of colon 2002  . History of stomach ulcers   . Hyperlipidemia   . Hypertension   . Hypogonadism male   . Klebsiella sepsis ([place]) 01/2017   due to acute biliary tract infection (no stones) and acute diverticulitis.  . Liver mass 04/2018   Bx 06/2019: probable cholangiocarcinoma ->with biliary obstruction.  . Macrocytic anemia 01/2017   vit B12 borderline low and iron borderline low: checking hemoccults and starting vit B12 PO and iron PO as of 02/11/17.  Vit B12 and folate normal as of GI f/u 06/2018.  . Myogenic ptosis of bilateral eyelids 2018   Plastic surgery in [place], [place] to do surg as of 03/2017.  Marland Kitchen NASH (nonalcoholic steatohepatitis) 06/2017   LFTs up, abd u/s showed fatty liver but no other abnormality.  . Osteoarthritis of right shoulder 09/2018   Near end-stage --->glenohumeral joint-->intra-articular steroid injection 09/2018 (Dr. Delilah Shan).  . Osteoarthritis of right shoulder    11/2018-->end stage, but not ready for total shoulder replacement.  . Past use of tobacco    quit 1984  . Rectal bleeding   . Rosacea   . S/P coronary artery bypass graft x 3   . Urine incontinence    Dr. Karsten Ro    Current Outpatient Medications  Medication Sig Dispense Refill  . allopurinol (ZYLOPRIM) 100 MG tablet TAKE ONE TABLET BY MOUTH ON MONDAY, WEDNESDAY, AND FRIDAY (Patient taking differently: Take 100 mg by mouth 3 (three) times a week. ) 36 tablet 3  . ALPRAZolam (NIRAVAM) 0.25 MG dissolvable tablet Take 0.25 mg by mouth at bedtime as needed for anxiety.    Marland Kitchen aspirin 325 MG tablet Take 325 mg by mouth daily.      . betamethasone dipropionate (DIPROLENE) 0.05 % cream Apply 1 application topically daily.     . ferrous sulfate 325 (65 FE) MG EC tablet Take 325 mg by mouth daily with breakfast.    . finasteride (PROSCAR) 5 MG tablet TAKE ONE TABLET BY MOUTH EVERY DAY 90 tablet 0   . Lancets (ONETOUCH ULTRASOFT) lancets Use to check blood sugar once daily 100 each 12  . lisinopril (PRINIVIL,ZESTRIL) 5 MG tablet TAKE ONE TABLET BY MOUTH DAILY (Patient taking differently: Take 5 mg by mouth daily. ) 90 tablet 1  . metFORMIN (GLUCOPHAGE-XR) 500 MG 24 hr tablet TAKE ONE TABLET BY MOUTH TWICE DAILY WITH MEALS (Patient taking differently: Take 500 mg by mouth 2 (two) times daily with a meal. ) 180 tablet 1  . metoprolol tartrate (LOPRESSOR) 25 MG tablet TAKE ONE TABLET BY MOUTH TWICE DAILY (Patient taking differently: Take 25 mg by mouth 2 (two) times daily. ) 60 tablet 5  . MULTIPLE VITAMIN PO Take 1 tablet by mouth daily.     Marland Kitchen omeprazole (PRILOSEC) 40 MG capsule Take 1 capsule (40 mg total) by mouth daily. 30 capsule 3  . PREVIDENT 5000 PLUS 1.1 % CREA dental cream Place 1 application onto teeth at bedtime.     . rosuvastatin (CRESTOR) 10 MG tablet TAKE ONE TABLET BY MOUTH EVERY DAY (Patient taking differently: Take 10 mg by mouth daily. ) 90 tablet 0  . tamsulosin (  FLOMAX) 0.4 MG CAPS capsule TAKE ONE CAPSULE BY MOUTH DAILY 90 capsule 3  . Trospium Chloride 60 MG CP24 TAKE ONE CAPSULE BY MOUTH DAILY 90 capsule 1  . vitamin B-12 (CYANOCOBALAMIN) 1000 MCG tablet Take 1,000 mcg by mouth daily.    . nitroGLYCERIN (NITROSTAT) 0.4 MG SL tablet Place 1 tablet (0.4 mg total) under the tongue every 5 (five) minutes as needed for chest pain. (Patient not taking: Reported on 05/21/2019) 25 tablet 3   No current facility-administered medications for this visit.     Past Surgical History:  Procedure Laterality Date  . BILIARY BRUSHING  06/26/2019   PATH: invasive adenocarcinoma.  Procedure: BILIARY BRUSHING;  Surgeon: Rush Landmark Telford Nab., MD;  Location: [place];  Service: Gastroenterology;;  . BILIARY DILATION  06/26/2019   Procedure: BILIARY DILATION;  Surgeon: Irving Copas., MD;  Location: [place];  Service: Gastroenterology;;  . BILIARY STENT PLACEMENT  06/26/2019    Procedure: [place];  Surgeon: Irving Copas., MD;  Location: [place];  Service: Gastroenterology;;  . CARDIOVASCULAR STRESS TEST  08/28/2016   Low risk myoview, normal EF, no ischemia.  Marland Kitchen CATARACT EXTRACTION, BILATERAL    . COLONOSCOPY  2002, 2006, 02/25/2009; 06/11/18   Polyp x 1 2002, none 2006 or 2010.  Adenomatous polyp 06/11/18+  signif diverticulosis.  NO FURTHER COLONOSCOPIES DUE TO AGE.  Marland Kitchen CORONARY ARTERY BYPASS GRAFT  06/2009   descending,saphenous vein graft to first obtuse marginal, sequential saphenous vein graft to posterior descending and posterolateral  . Endoscopic vein harvest right thigh    . ERCP N/A 06/26/2019   Procedure: ENDOSCOPIC RETROGRADE CHOLANGIOPANCREATOGRAPHY (ERCP);  Surgeon: Irving Copas., MD;  Location: [place];  Service: Gastroenterology;  Laterality: N/A;  with stent   . IR RADIOLOGIST EVAL & MGMT  06/12/2018  . IR RADIOLOGIST EVAL & MGMT  09/18/2018  . IR RADIOLOGIST EVAL & MGMT  05/26/2019  . IR RADIOLOGIST EVAL & MGMT  07/29/2019  . PTCA    . REMOVAL OF STONES  06/26/2019   Procedure: REMOVAL OF STONES;  Surgeon: Rush Landmark Telford Nab., MD;  Location: [place];  Service: Gastroenterology;;  . Joan Mayans  06/26/2019   Procedure: Joan Mayans;  Surgeon: Irving Copas., MD;  Location: [place];  Service: Gastroenterology;;  . TONSILLECTOMY    . UMBILICAL HERNIA REPAIR     2009  . VASECTOMY      Family History  Problem Relation Age of Onset  . Cancer Mother   . Cancer Father        pt points to LLQ as  area of cancer, so potentially could have been intestinal.     . Colon cancer Neg Hx   . Esophageal cancer Neg Hx   . Stomach cancer Neg Hx   . Rectal cancer Neg Hx     Social History   Tobacco Use  . Smoking status: Former Smoker    Packs/day: 1.00    Years: 30.00    Pack years: 30.00    Types: Cigarettes    Quit date: 03/15/1983    Years since quitting: 36.4  . Smokeless tobacco: Never  Used  . Tobacco comment: Quit 1984  Substance Use Topics  . Alcohol use: Not Currently    Alcohol/week: 5.0 - 7.0 standard drinks    Types: 5 - 7 Glasses of wine per week    Comment: quit etoh 1999 but in ~ 2017 began having a glass of wine with dinner   . Drug  use: No    Allergies  Allergen Reactions  . Sulfonamide Derivatives Nausea Only     Review of Systems: All systems reviewed and negative except where noted in HPI.    Physical Exam:    Today's Vitals   08/03/19 1106  BP: 124/62  Pulse: 63  Temp: 98.1 F (36.7 C)  Weight: 158 lb 8 oz (71.9 kg)  Height: _0  (1.575 m)   Body mass index is 28.99 kg/m.   BP 124/62   Pulse 63   Temp 98.1 F (36.7 C)   Ht _1  (1.575 m)   Wt 158 lb 8 oz (71.9 kg)   BMI 28.99 kg/m  _2 @ GENERAL:  Alert, oriented, cooperative, not in acute distress. PSYCH: :Pleasant, normal mood and affect. HEENT:  conjunctiva pink, mucous membranes moist, neck supple without masses. No jaundice. CARDIAC:  S1 S2 normal. No murmers. PULM: Normal respiratory effort, lungs CTA bilaterally, no wheezing. ABDOMEN: Inspection: No visible peristalsis, no abnormal pulsations, skin normal.  Palpation/percussion: Soft, nontender, nondistended, no rigidity, no abnormal dullness to percussion, no hepatosplenomegaly and no palpable abdominal masses.  Auscultation: Normal bowel sounds, no abdominal bruits. Rectal exam: Deferred SKIN:  turgor, no lesions seen. Musculoskeletal:  Normal muscle tone, normal strength. NEURO: Alert and oriented x 3, no focal neurologic deficits.   Data Reviewed: I have personally reviewed following labs and imaging studies  Hepatic Function Latest Ref Rng & Units 07/17/2019 07/03/2019 06/29/2019  Total Protein 6.5 - 8.1 g/dL 6.3(L) 6.3(L) 5.6(L)  Albumin 3.5 - 5.0 g/dL 3.7 3.8 2.7(L)  AST 15 - 41 U/L 13(L) 15 24  ALT 0 - 44 U/L 10 36 86(H)  Alk Phosphatase 38 - 126 U/L 101 132(H) 141(H)  Total Bilirubin 0.3 - 1.2  mg/dL 0.5 0.7 0.9  Bilirubin, Direct 0.0 - 0.2 mg/dL - - -  25 minutes spent with the patient today. Greater than 50% was spent in counseling and coordination of care with the patient      Jacoba Cherney,MD 08/03/2019, 11:20 AM   CC McGowen, Adrian [place], MD

## 2019-08-03 NOTE — Progress Notes (Signed)
GI Location of Tumor / Histology: Cholangiocarcinoma  Ricardo Washington presented with abdominal pain  CT abd/pelvis (triple phase) 07/09/2019: ill defined subtle area of perfusion abnormality in the right central liver, corresponding to the area of hypermetabolic activity seen previously on PET.  There was no evidence of other liver abnormality or abdominal lymphadenopathy.  ERCP with Brushing 06/26/2019: path showed high grade glandular dysplagia with microscopic focus of invasive adenocarcinoma.  MRI Abdomen 06/24/2019: Persistent duct dilatation in segment 8/6 of the RIGHT hepatic lobe with suspicion of region central obstruction identified by hypermetabolic tissue on comparison FDG PET scan. The MRI findings are very similar to MRI of 05/03/2018. The tumor is very poorly demonstrated on current exam. There is some limitation to the imaging due to patient body motion.  Normal distal common bile duct. Normal pancrea  PET 08/20/2018: Focus of activity centrally within liver remains but is slightly less intense and less focal. No clear evidence of new lesions within the liver.  No evidence of metastatic disease outside the liver.  MRI abdomen 05/03/2018:Intrahepatic duct dilatation centered in the periphery of segment 8 with a central area of subtle ductal caught off, probable mild T2 hyperintensity, and delayed post-contrast hypointensity or hypoenhancement. 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 Potential clinical strategies include focused ultrasound to allow possible sampling, PET, or if patient is not a good biopsy or surgical candidate, follow-up pre and post contrast abdominal MRI at 3 months.  CT abdomen 04/30/2018:Focal intrahepatic biliary ductal dilatation most evident in segment 8 of the liver where there is also some regional atrophy. Ill-defined hypovascular area near the hepatic hilum. The possibility of a central neoplasm should be considered, and further evaluation with  nonemergent MRI of the abdomen with and without IV gadolinium is strongly recommended in the near future.   Biopsies of Common Bile Duct 06/26/2019    Past/Anticipated interventions by Interventional Radiology, if any: Dr. Reesa Chew 07/29/2019 - He is recovered from the ERCP procedure and is back at home.   -Given his advanced age, limited performance status, and comorbidities as well as dementia, I would agree with a conservative approach with continued imaging surveillance.   -If there is any disease progression, I would not recommend any IR directed therapy including ablation or embolization.   -Apparently he is not a candidate for chemotherapy as well.  If there is true disease progression by imaging he could be seen for SBRT  Past/Anticipated interventions by medical oncology, if any:  Dr. Maylon Peppers 07/17/2019 -I reached out to IR to see if the patient would be a candidate for any local therapy; if IR directed therapy is not feasible, then the next option will be SBRT. -Given the patients advanced age, limited performance status, and extensive co-morbidities, including moderate to severe Alzheimer's dementia, he is not a candidate for any systemic therapy, as the risks/harms of chemotherapy likely greatly outweigh any benefits. -We also discussed briefly regarding frequency of imaging surveillance.  If surveillance imaging shows disease progression in the future, treatment options would be very limited, which then raises question regarding the value of surveillance imaging if it is not going to change the management.  We ultimately agreed on surveillance imaging approximately every 6 months, and if it identifies any local disease recurrence, we will consider any local therapy options as needed.   Weight changes, if any: Yes, has had some weight loss, unsure how many pounds.  Bowel/Bladder complaints, if any: No   Nausea / Vomiting, if any: No  Pain  issues, if any: No       SAFETY ISSUES:   Prior radiation? No  Pacemaker/ICD? No  Possible current pregnancy? n/a  Is the patient on methotrexate? No  Current Complaints/Details:

## 2019-08-03 NOTE — Telephone Encounter (Signed)
Left message for patient to verify mychart visit for pre reg

## 2019-08-04 ENCOUNTER — Encounter: Payer: Self-pay | Admitting: Radiation Oncology

## 2019-08-04 ENCOUNTER — Telehealth: Payer: Self-pay

## 2019-08-04 ENCOUNTER — Ambulatory Visit
Admission: RE | Admit: 2019-08-04 | Discharge: 2019-08-04 | Disposition: A | Discharge status: Home / Self Care | Payer: Medicare Other | Source: Ambulatory Visit | Attending: Radiation Oncology | Admitting: Radiation Oncology

## 2019-08-04 VITALS — Ht 62.0 in | Wt 158.0 lb

## 2019-08-04 DIAGNOSIS — C221 Intrahepatic bile duct carcinoma: Secondary | ICD-10-CM

## 2019-08-04 DIAGNOSIS — F0390 Unspecified dementia without behavioral disturbance: Secondary | ICD-10-CM | POA: Diagnosis not present

## 2019-08-04 NOTE — Telephone Encounter (Signed)
Recall is in Epic for ERCP in December

## 2019-08-04 NOTE — Telephone Encounter (Signed)
-----   Message from Irving Copas., MD sent at 08/03/2019  5:50 PM EDT ----- Regarding: RE: Nonurgent question Raj,Thanks for reaching out.I was planning probably 4 to 6 months from the last procedure so probably December or January or February if he is doing well.If we think his life expectancy is good enough I will probably order a couple of intraductal stents that we could have available for [place] depending on how things look.Alanya Vukelich, I think you have this patient on a follow-up reminder list but let us plan for a repeat ERCP at the end of December or in January or early February for stent exchange.Thanks.GM ----- Message ----- From: Jackquline Denmark, MD Sent: 08/03/2019   5:37 PM EDT To: Irving Copas., MD Subject: Nonurgent question                             [facility] are doing well. Quick ? about Mr. Guidroz (guy with stage I cholangiocarcinoma, with right hepatic plastic stent 06/26/2019).   When would you like to change stent/put in a metal stent?   He is doing very well No treatment per IR/oncology.  He is to see Dr. Lisbeth Renshaw tomorrow.  Appreciate your help, as always  RG

## 2019-08-04 NOTE — Progress Notes (Signed)
Radiation Oncology         [phone] ________________________________  Name: Ricardo Washington        MRN: [id number]  Date of Service: 08/04/2019 DOB: 10-Jul-1935  CC:McGowen, Adrian Blackwater, MD  Tish Men, MD     REFERRING PHYSICIAN: Tish Men, MD   DIAGNOSIS: The encounter diagnosis was Cholangiocarcinoma [facility]).   HISTORY OF PRESENT ILLNESS: Ricardo Washington is a 84 y.o. male seen at the request of Dr. Maylon Peppers for a new diagnosed with an intrahepatic cholangiocarcinoma. The patient had an episode of abdominal pain and nausea in the summer of 2019 and an abnormality was seen in the liver on 04/30/2018 with intrahepatic biliary ductal dilatation in segment 8 of the liver and and ill defined hypovascular areas in the hepatic hilum concerning for an infiltrative lesion. PET on 05/19/2018 revealed hypermetabolism of the area seen on CT measuring about 2.6 cm, with an SUV of 5.73.   Colonoscopy was proceeded on 06/11/18 and this only revealed a colonic polyp without visible tumor, and this was non cancerous. He was not a candidate at that time for EUS biopsy or IR biopsy, and repeat PET was planned and occurred on 05/15/2019 which again located this area in the right hepatic lobe with an SUV of 5.7, and intrahepatic biliary dilatation and retraction of the hepatic capsule. He was recently admitted to the hospital with another episode of abdominal pain and an MRI confirmed his poorly visualized area of soft tissue change in the right hepatic lobe consistent with the location of his prior PET findings. An ERCP on 06/26/2019 was performed and sphincterotoplasty and balloon dilation was performed and a plastic stent was left in place. Brushings of the strictured bile duct revealed at least high grade glandular dysplasia in one specimen and high grade dysplasia with microscopic foci of invasive adenocarcinoma in the second specimen consistent with cholangiocarcinoma. He met back with Dr. Annamaria Boots in IR after hospital  discharge, and was not a candidate for an invasive procedures due to his comorbidities and performance status. He was not a candidate for chemotherapy either, after talking with Dr. Maylon Peppers. He is seen today via MyChart to discuss options of stereotactic body radiotherapy (SBRT).  PREVIOUS RADIATION THERAPY: No   PAST MEDICAL HISTORY:  Past Medical History:  Diagnosis Date   BPH with obstruction/lower urinary tract symptoms 10/27/2015   Dr. Karsten Ro   Cancer [facility])    Cataract    Colon wall thickening 04/2018   "mass-like" per radiologist interpretation; colonoscopy as next step---f/u colonoscopy showed 'tics but o/w normal.  NO further colonoscopies needed due to age.   Coronary artery disease    with preserved LV function.   Dementia ([place])    Diabetes mellitus without complication ([place])    Diverticulosis of colon    severe, entire colon   Dysphagia 11/2018   Barium swallow: mild esophageal dysmotility.   Ectatic abdominal aorta ([place]) 01/2017   Abd u/s: 2.9 cm aortic ectasia--at risk for aneurism development.  Recheck aortic u/s 5 yrs.   Erectile dysfunction due to arterial insufficiency    Fatigue    Gout    always 2nd toe L foot (uric acid 6.20 Dec 2014 per old records)   Hepatic steatosis    History of adenomatous polyp of colon 2002   History of stomach ulcers    Hyperlipidemia    Hypertension    Hypogonadism male    Klebsiella sepsis ([place]) 01/2017   due to acute biliary tract  infection (no stones) and acute diverticulitis.   Liver mass 04/2018   Bx 06/2019: probable cholangiocarcinoma ->with biliary obstruction.   Macrocytic anemia 01/2017   vit B12 borderline low and iron borderline low: checking hemoccults and starting vit B12 PO and iron PO as of 02/11/17.  Vit B12 and folate normal as of GI f/u 06/2018.   Myogenic ptosis of bilateral eyelids 2018   Plastic surgery in [place], [place] to do surg as of 03/2017.   NASH (nonalcoholic steatohepatitis) 06/2017    LFTs up, abd u/s showed fatty liver but no other abnormality.   Osteoarthritis of right shoulder 09/2018   Near end-stage --->glenohumeral joint-->intra-articular steroid injection 09/2018 (Dr. Delilah Ricardo).   Osteoarthritis of right shoulder    11/2018-->end stage, but not ready for total shoulder replacement.   Past use of tobacco    quit 1984   Rectal bleeding    Rosacea    S/P coronary artery bypass graft x 3    Urine incontinence    Dr. Karsten Ro       PAST SURGICAL HISTORY: Past Surgical History:  Procedure Laterality Date   BILIARY BRUSHING  06/26/2019   PATH: invasive adenocarcinoma.  Procedure: BILIARY BRUSHING;  Surgeon: Rush Landmark Telford Nab., MD;  Location: [place];  Service: Gastroenterology;;   BILIARY DILATION  06/26/2019   Procedure: BILIARY DILATION;  Surgeon: Irving Copas., MD;  Location: [place];  Service: Gastroenterology;;   BILIARY STENT PLACEMENT  06/26/2019   Procedure: BILIARY STENT PLACEMENT;  Surgeon: Irving Copas., MD;  Location: [place];  Service: Gastroenterology;;   CARDIOVASCULAR STRESS TEST  08/28/2016   Low risk myoview, normal EF, no ischemia.   CATARACT EXTRACTION, BILATERAL     COLONOSCOPY  2002, 2006, 02/25/2009; 06/11/18   Polyp x 1 2002, none 2006 or 2010.  Adenomatous polyp 06/11/18+  signif diverticulosis.  NO FURTHER COLONOSCOPIES DUE TO AGE.   CORONARY ARTERY BYPASS GRAFT  06/2009   descending,saphenous vein graft to first obtuse marginal, sequential saphenous vein graft to posterior descending and posterolateral   Endoscopic vein harvest right thigh     ERCP N/A 06/26/2019   Procedure: ENDOSCOPIC RETROGRADE CHOLANGIOPANCREATOGRAPHY (ERCP);  Surgeon: Irving Copas., MD;  Location: [place];  Service: Gastroenterology;  Laterality: N/A;  with stent    IR RADIOLOGIST EVAL & MGMT  06/12/2018   IR RADIOLOGIST EVAL & MGMT  09/18/2018   IR RADIOLOGIST EVAL & MGMT  05/26/2019   IR RADIOLOGIST EVAL  & MGMT  07/29/2019   PTCA     REMOVAL OF STONES  06/26/2019   Procedure: REMOVAL OF STONES;  Surgeon: Irving Copas., MD;  Location: [place];  Service: Gastroenterology;;   Joan Mayans  06/26/2019   Procedure: Joan Mayans;  Surgeon: Mansouraty, Telford Nab., MD;  [facility] END[facility]COPY;  Service: Gastroenterology;;   TONSILLECTOMY     UMBILICAL HERNIA REPAIR     2009   VASECTOMY       FAMILY HISTORY:  Family History  Problem Relation Age of Onset   Cancer Mother    Cancer Father        pt points to LLQ as  area of cancer, so potentially could have been intestinal.      Colon cancer Neg Hx    Esophageal cancer Neg Hx    Stomach cancer Neg Hx    Rectal cancer Neg Hx      SOCIAL HISTORY:  reports that he quit smoking about 36 years ago. His smoking use included cigarettes. He  has a 30.00 pack-year smoking history. He has never used smokeless tobacco. He reports previous alcohol use of about 5.0 - 7.0 standard drinks of alcohol per week. He reports that he does not use drugs.The patient is retired and lives in [place]. He is a retired Pension scheme manager. He has a son in [place] who is a Marketing executive who may want to discuss the details of our process.   ALLERGIES: Sulfonamide derivatives   MEDICATIONS:  Current Outpatient Medications  Medication Sig Dispense Refill   allopurinol (ZYLOPRIM) 100 MG tablet TAKE ONE TABLET BY MOUTH ON MONDAY, WEDNESDAY, AND FRIDAY (Patient taking differently: Take 100 mg by mouth 3 (three) times a week. ) 36 tablet 3   ALPRAZolam (NIRAVAM) 0.25 MG dissolvable tablet Take 0.25 mg by mouth at bedtime as needed for anxiety.     aspirin 325 MG tablet Take 325 mg by mouth daily.       betamethasone dipropionate (DIPROLENE) 0.05 % cream Apply 1 application topically daily.      ferrous sulfate 325 (65 FE) MG EC tablet Take 325 mg by mouth daily with breakfast.     finasteride (PROSCAR) 5 MG tablet TAKE ONE TABLET BY  MOUTH EVERY DAY 90 tablet 0   Lancets (ONETOUCH ULTRASOFT) lancets Use to check blood sugar once daily 100 each 12   lisinopril (PRINIVIL,ZESTRIL) 5 MG tablet TAKE ONE TABLET BY MOUTH DAILY (Patient taking differently: Take 5 mg by mouth daily. ) 90 tablet 1   metFORMIN (GLUCOPHAGE-XR) 500 MG 24 hr tablet TAKE ONE TABLET BY MOUTH TWICE DAILY WITH MEALS (Patient taking differently: Take 500 mg by mouth 2 (two) times daily with a meal. ) 180 tablet 1   metoprolol tartrate (LOPRESSOR) 25 MG tablet TAKE ONE TABLET BY MOUTH TWICE DAILY (Patient taking differently: Take 25 mg by mouth 2 (two) times daily. ) 60 tablet 5   MULTIPLE VITAMIN PO Take 1 tablet by mouth daily.      omeprazole (PRILOSEC) 40 MG capsule Take 1 capsule (40 mg total) by mouth daily. 30 capsule 3   PREVIDENT 5000 PLUS 1.1 % CREA dental cream Place 1 application onto teeth at bedtime.      rosuvastatin (CRESTOR) 10 MG tablet TAKE ONE TABLET BY MOUTH EVERY DAY (Patient taking differently: Take 10 mg by mouth daily. ) 90 tablet 0   tamsulosin (FLOMAX) 0.4 MG CAPS capsule TAKE ONE CAPSULE BY MOUTH DAILY 90 capsule 3   Trospium Chloride 60 MG CP24 TAKE ONE CAPSULE BY MOUTH DAILY 90 capsule 1   vitamin B-12 (CYANOCOBALAMIN) 1000 MCG tablet Take 1,000 mcg by mouth daily.     nitroGLYCERIN (NITROSTAT) 0.4 MG SL tablet Place 1 tablet (0.4 mg total) under the tongue every 5 (five) minutes as needed for chest pain. (Patient not taking: Reported on 05/21/2019) 25 tablet 3   No current facility-administered medications for this encounter.      REVIEW OF SYSTEMS: On review of systems, the patient reports that he is doing well overall. He denies any chest pain, shortness of breath, cough, fevers, chills, night sweats, unintended weight changes. He denies any bowel or bladder disturbances, and denies abdominal pain, nausea or vomiting. He is eating well and he denies any new musculoskeletal or joint aches or pains. A complete review of  systems is obtained and is otherwise negative.     PHYSICAL EXAM:  Wt Readings from Last 3 Encounters:  08/04/19 158 lb (71.7 kg)  08/03/19 158 lb 8 oz (71.9 kg)  07/17/19 159 lb 12.8 oz (72.5 kg)    Pain Assessment Pain Score: 0-No pain/10  In general this is a well appearing caucasian male in no acute distress. He's alert and oriented x4 and appropriate throughout the examination. Cardiopulmonary assessment is negative for acute distress and he exhibits normal effort.     ECOG = 0  0 - Asymptomatic (Fully active, able to carry on all predisease activities without restriction)  1 - Symptomatic but completely ambulatory (Restricted in physically strenuous activity but ambulatory and able to carry out work of a light or sedentary nature. For example, light housework, office work)  2 - Symptomatic, <50% in bed during the day (Ambulatory and capable of all self care but unable to carry out any work activities. Up and about more than 50% of waking hours)  3 - Symptomatic, >50% in bed, but not bedbound (Capable of only limited self-care, confined to bed or chair 50% or more of waking hours)  4 - Bedbound (Completely disabled. Cannot carry on any self-care. Totally confined to bed or chair)  5 - Death   Eustace Pen MM, Creech RH, Tormey DC, et al. [phone]). "Toxicity and response criteria of the [facility]". [place] Oncol. 5 (6): 649-55    LABORATORY DATA:  Lab Results  Component Value Date   WBC 6.6 08/03/2019   HGB 12.1 (L) 08/03/2019   HCT 35.9 (L) 08/03/2019   MCV 102.8 (H) 08/03/2019   PLT 160.0 08/03/2019   Lab Results  Component Value Date   NA 140 08/03/2019   K 3.9 08/03/2019   CL 105 08/03/2019   CO2 26 08/03/2019   Lab Results  Component Value Date   ALT 10 08/03/2019   AST 14 08/03/2019   ALKPHOS 84 08/03/2019   BILITOT 0.5 08/03/2019      RADIOGRAPHY: Ct Abdomen Pelvis W Wo Contrast  Result Date: 07/09/2019 CLINICAL DATA:   Intrahepatic cholangiocarcinoma of the right hepatic lobe with ductal dilatation previously identified in segment VIII. EXAM: CT ABDOMEN AND PELVIS WITHOUT AND WITH CONTRAST TECHNIQUE: Multidetector CT imaging of the abdomen and pelvis was performed following the standard protocol before and following the bolus administration of intravenous contrast. CONTRAST:  136m OMNIPAQUE IOHEXOL 350 MG/ML SOLN COMPARISON:  MRI 06/24/2019.  PET-CT 05/18/2019. FINDINGS: Lower chest: Unremarkable. Hepatobiliary: Multiphase imaging of the liver shows no obvious lesion in the central right liver to explain the mild intrahepatic biliary duct dilatation seen mainly in segments VII and VIII on the current study. There is some subtle arterial phase enhancement in the central right liver seen on arterial phase imaging (axial 35/series 4) which becomes hypoattenuating compared to liver parenchyma on more delayed imaging (33/8). This region corresponds to the hypermetabolic disease seen on the previous PET-CT and is compatible with the patient's known neoplasm. Common bile duct stent is visualized in situ and trace pneumobilia in the left hepatic lobe is compatible with the presence of the stent device. Mild gallbladder wall thickening versus pericholecystic fluid evident. Pancreas: No focal mass lesion. No dilatation of the main duct. No intraparenchymal cyst. No peripancreatic edema. Spleen: No splenomegaly. No focal mass lesion. Adrenals/Urinary Tract: No adrenal nodule or mass. Kidneys unremarkable. Stomach/Bowel: Stomach is unremarkable. No gastric wall thickening. No evidence of outlet obstruction. Duodenum is normally positioned as is the ligament of Treitz. No small bowel wall thickening. No small bowel dilatation. The terminal ileum is normal. The appendix is not visualized, but there is no edema or inflammation in  the region of the cecum. No gross colonic mass. No colonic wall thickening. Diverticular changes are noted in the  left colon without evidence of diverticulitis. Vascular/Lymphatic: Normal hepatic arterial branch anatomy. Celiac axis and SMA are patent. IMA opacifies normally. Portal vein, superior mesenteric vein, and splenic vein are patent. Hepatic veins opacify normally. There is no gastrohepatic or hepatoduodenal ligament lymphadenopathy. No intraperitoneal or retroperitoneal lymphadenopathy. No pelvic sidewall lymphadenopathy. Reproductive: The prostate gland and seminal vesicles are unremarkable. Other: No intraperitoneal free fluid. Musculoskeletal: No worrisome lytic or sclerotic osseous abnormality. IMPRESSION: 1. Ill-defined subtle area of differential perfusion identified in the central right liver, corresponding to the location of the abnormal hypermetabolism seen on the previous PET-CT. Imaging features compatible with the patient's known neoplasm and there is associated mild intrahepatic biliary duct dilatation mainly in segments VII and VIII. 2. Minimal gallbladder wall thickening versus trace pericholecystic fluid. 3. No evidence for lymphadenopathy in the abdomen or pelvis. 4. Left colonic diverticulosis without diverticulitis. 5.  Aortic Atherosclerois (ICD10-170.0) Electronically Signed   By: Misty Stanley M.D.   On: 07/09/2019 16:24   Ir Radiologist Eval & Mgmt  Result Date: 07/29/2019 Please refer to notes tab for details about interventional procedure. (Op Note)      IMPRESSION/PLAN: 1. Stage IA, [id number] intrahepatic cholangiocarcinoma of the right hepatic lobe. Dr. Lisbeth Renshaw discusses the pathology findings and reviews the nature of intrahepatic primary liver disease. He discusses the utility of SBRT in the setting of patients not being candidates for other interventional procedures. Though the tumor is somewhat indistinct, he would benefit from SBRT treatment with the hopes of definitively treating this site. He discusses the risks, benefits, short, and long term effects of radiotherapy, and the  patient and his wife are interested in proceeding. Dr. Lisbeth Renshaw discusses the delivery and logistics of radiotherapy and anticipates a course of 5-10 fractions of treatment. Due to the indistinct findings by imaging, there does not appear to be a role for fiducial marker placement. The patient will be contacted to coordinate simulation and subsequent treatment.  2. Dementia. The patient will be given permission to have a family member accompany him to our office given his underlying dementia in the midst of coronavirus pandemic visitor restrictions.  This encounter was provided by telemedicine platform MyChart.  The patient has given verbal consent for this type of encounter and has been advised to only accept a meeting of this type in a secure network environment. The time spent during this encounter was 45 minutes. The attendants for this meeting include Blenda Nicely, RN, Dr. Lisbeth Renshaw, Hayden Pedro  and Cain Sieve, and his wife Mechele Claude .  During the encounter,  Blenda Nicely, RN, Dr. Lisbeth Renshaw, and Hayden Pedro were located at [facility] Radiation Oncology Department.  Jowel Waltner Kain and his wife Mechele Claude were located at home.    The above documentation reflects my direct findings during this shared patient visit. Please see the separate note by Dr. Lisbeth Renshaw on this date for the remainder of the patient's plan of care.    Carola Rhine, PAC

## 2019-08-05 ENCOUNTER — Encounter: Payer: Self-pay | Admitting: Family Medicine

## 2019-08-10 ENCOUNTER — Other Ambulatory Visit: Payer: Self-pay | Admitting: Family Medicine

## 2019-08-11 ENCOUNTER — Other Ambulatory Visit: Payer: Self-pay

## 2019-08-11 ENCOUNTER — Ambulatory Visit
Admission: RE | Admit: 2019-08-11 | Discharge: 2019-08-11 | Disposition: A | Discharge status: Home / Self Care | Payer: Medicare Other | Source: Ambulatory Visit | Attending: Radiation Oncology | Admitting: Radiation Oncology

## 2019-08-11 ENCOUNTER — Other Ambulatory Visit: Payer: Self-pay | Admitting: Radiation Oncology

## 2019-08-11 VITALS — BP 132/64 | HR 64 | Temp 97.6°F | Resp 16

## 2019-08-11 DIAGNOSIS — Z510 Encounter for antineoplastic radiation therapy: Secondary | ICD-10-CM | POA: Insufficient documentation

## 2019-08-11 DIAGNOSIS — C221 Intrahepatic bile duct carcinoma: Secondary | ICD-10-CM

## 2019-08-11 MED ORDER — SODIUM CHLORIDE 0.9% FLUSH
10.0000 mL | Freq: Once | INTRAVENOUS | Status: AC
  Administered 2019-08-11: 10 mL via INTRAVENOUS

## 2019-08-11 NOTE — Progress Notes (Signed)
Has armband been applied?  Yes  Does patient have an allergy to IV contrast dye?: No   Has patient ever received premedication for IV contrast dye?: No  Does patient take metformin?: Yes  If patient does take metformin when was the last dose:   Date of lab work: 08/03/2019 BUN: 16 CR: 1.02 GFR: 69.81  IV site: Left Forearm  Has IV site been added to flowsheet?  Yes  BP 132/64 (BP Location: Left Arm)   Pulse 64   Temp 97.6 F (36.4 C) (Oral)   Resp 16   SpO2 100%

## 2019-08-12 ENCOUNTER — Telehealth: Payer: Self-pay | Admitting: Hematology & Oncology

## 2019-08-12 NOTE — Telephone Encounter (Signed)
Mailed cancer claim forms to: [facility] Attn: CLAIMS DEPT [address] [place] [id number]  Per pt's wife JoAnne request         COPY SCANNED

## 2019-08-13 ENCOUNTER — Other Ambulatory Visit: Payer: Self-pay

## 2019-08-13 ENCOUNTER — Ambulatory Visit
Admission: RE | Admit: 2019-08-13 | Discharge: 2019-08-13 | Disposition: A | Discharge status: Home / Self Care | Payer: Medicare Other | Source: Ambulatory Visit | Attending: Radiation Oncology | Admitting: Radiation Oncology

## 2019-08-13 DIAGNOSIS — C221 Intrahepatic bile duct carcinoma: Secondary | ICD-10-CM

## 2019-08-13 DIAGNOSIS — Z510 Encounter for antineoplastic radiation therapy: Secondary | ICD-10-CM | POA: Diagnosis not present

## 2019-08-13 LAB — BUN & CREATININE (CHCC)
BUN: 19 mg/dL (ref 8–23)
Creatinine: 1.09 mg/dL (ref 0.61–1.24)
GFR, Est AFR Am: >60 mL/min (ref >60)
GFR, Estimated: >60 mL/min (ref >60)

## 2019-08-14 ENCOUNTER — Telehealth: Payer: Self-pay | Admitting: *Deleted

## 2019-08-14 NOTE — Telephone Encounter (Signed)
Spoke with Ricardo Washington to let her know that Ricardo Washington's lab work came back fine and he can restart his metformin.  She verbalized understanding.  We went over his schedule for his upcoming treatments.  No further questions.   [name]. Leonie Green, BSN

## 2019-08-18 ENCOUNTER — Other Ambulatory Visit: Payer: Self-pay

## 2019-08-18 ENCOUNTER — Ambulatory Visit
Admission: RE | Admit: 2019-08-18 | Discharge: 2019-08-18 | Disposition: A | Discharge status: Home / Self Care | Payer: Medicare Other | Source: Ambulatory Visit | Attending: Radiation Oncology | Admitting: Radiation Oncology

## 2019-08-18 DIAGNOSIS — C221 Intrahepatic bile duct carcinoma: Secondary | ICD-10-CM | POA: Diagnosis not present

## 2019-08-18 DIAGNOSIS — Z510 Encounter for antineoplastic radiation therapy: Secondary | ICD-10-CM | POA: Diagnosis not present

## 2019-08-20 ENCOUNTER — Other Ambulatory Visit: Payer: Self-pay

## 2019-08-20 ENCOUNTER — Ambulatory Visit
Admission: RE | Admit: 2019-08-20 | Discharge: 2019-08-20 | Disposition: A | Discharge status: Home / Self Care | Payer: Medicare Other | Source: Ambulatory Visit | Attending: Radiation Oncology | Admitting: Radiation Oncology

## 2019-08-20 DIAGNOSIS — Z510 Encounter for antineoplastic radiation therapy: Secondary | ICD-10-CM | POA: Insufficient documentation

## 2019-08-20 DIAGNOSIS — C221 Intrahepatic bile duct carcinoma: Secondary | ICD-10-CM | POA: Insufficient documentation

## 2019-08-20 NOTE — Progress Notes (Signed)
[facility] Radiation Oncology Simulation and Treatment Planning Note   Name:  Ricardo Washington MRN: [id number]   Date: 08/11/2019  DOB: Aug 14, 1935  Status:outpatient    DIAGNOSIS:    ICD-10-CM   1. Cholangiocarcinoma ([place])  C22.1      TREATMENT SITE:  liver   CONSENT VERIFIED:yes   SET UP: Patient is setup supine   IMMOBILIZATION: The patient was immobilized using a customized Vac Loc bag/ blue bag and customized accuform device   NARRATIVE:The patient was brought to the [place].  Identity was confirmed.  All relevant records and images related to the planned course of therapy were reviewed.  Then, the patient was positioned in a stable reproducible clinical set-up for radiation therapy. Abdominal compression was applied by me.  4D CT images were obtained and reproducible breathing pattern was confirmed. Free breathing CT images were obtained.  Skin markings were placed.  The CT images were loaded into the planning software where the target and avoidance structures were contoured.  The radiation prescription was entered and confirmed.    TREATMENT PLANNING NOTE:  Treatment planning then occurred. I have requested : IMRT planning.This treatment technique is medically necessary due to the high-dose of radiation delivered to the target region which is in close proximity to adjacent critical normal structures.  3 dimensional simulation is performed and dose volume histogram of the gross tumor volume, planning tumor volume and criticial normal structures including the spinal cord and lungs were analyzed and requested.  Special treatment procedure was performed due to high dose per fraction.  The patient will be monitored for increased risk of toxicity.  Daily imaging using cone beam CT will be used for target localization.  I anticipate that the patient will receive 50 Gy in 5 fractions to target volume. Further adjustments will be made based on  the planning process is necessary.  ------------------------------------------------  Jodelle Gross, MD, PhD

## 2019-08-24 ENCOUNTER — Telehealth: Payer: Self-pay | Admitting: Family Medicine

## 2019-08-24 ENCOUNTER — Ambulatory Visit
Admission: RE | Admit: 2019-08-24 | Discharge: 2019-08-24 | Disposition: A | Discharge status: Home / Self Care | Payer: Medicare Other | Source: Ambulatory Visit | Attending: Radiation Oncology | Admitting: Radiation Oncology

## 2019-08-24 ENCOUNTER — Other Ambulatory Visit: Payer: Self-pay | Admitting: Family Medicine

## 2019-08-24 ENCOUNTER — Other Ambulatory Visit: Payer: Self-pay

## 2019-08-24 DIAGNOSIS — C221 Intrahepatic bile duct carcinoma: Secondary | ICD-10-CM | POA: Diagnosis not present

## 2019-08-24 DIAGNOSIS — Z510 Encounter for antineoplastic radiation therapy: Secondary | ICD-10-CM | POA: Diagnosis not present

## 2019-08-24 NOTE — Telephone Encounter (Signed)
Pharmacy called and asked for approval to make Metoprolol a 90 day supply. I spoke with Dr Anitra Lauth for a verbal approval. Pharmacy changed to a 90 ds with a refill.

## 2019-08-25 ENCOUNTER — Ambulatory Visit: Payer: Medicare Other | Admitting: Radiation Oncology

## 2019-08-26 ENCOUNTER — Other Ambulatory Visit: Payer: Self-pay

## 2019-08-26 ENCOUNTER — Ambulatory Visit
Admission: RE | Admit: 2019-08-26 | Discharge: 2019-08-26 | Disposition: A | Discharge status: Home / Self Care | Payer: Medicare Other | Source: Ambulatory Visit | Attending: Radiation Oncology | Admitting: Radiation Oncology

## 2019-08-26 DIAGNOSIS — C221 Intrahepatic bile duct carcinoma: Secondary | ICD-10-CM | POA: Diagnosis not present

## 2019-08-26 DIAGNOSIS — E1351 Other specified diabetes mellitus with diabetic peripheral angiopathy without gangrene: Secondary | ICD-10-CM | POA: Diagnosis not present

## 2019-08-26 DIAGNOSIS — L602 Onychogryphosis: Secondary | ICD-10-CM | POA: Diagnosis not present

## 2019-08-26 DIAGNOSIS — Z510 Encounter for antineoplastic radiation therapy: Secondary | ICD-10-CM | POA: Diagnosis not present

## 2019-08-27 ENCOUNTER — Ambulatory Visit: Payer: Medicare Other | Admitting: Radiation Oncology

## 2019-08-28 ENCOUNTER — Encounter: Payer: Self-pay | Admitting: Radiation Oncology

## 2019-08-28 ENCOUNTER — Other Ambulatory Visit: Payer: Self-pay

## 2019-08-28 ENCOUNTER — Encounter: Payer: Self-pay | Admitting: Hematology

## 2019-08-28 ENCOUNTER — Ambulatory Visit
Admission: RE | Admit: 2019-08-28 | Discharge: 2019-08-28 | Disposition: A | Discharge status: Home / Self Care | Payer: Medicare Other | Source: Ambulatory Visit | Attending: Radiation Oncology | Admitting: Radiation Oncology

## 2019-08-28 ENCOUNTER — Inpatient Hospital Stay: Payer: Medicare Other | Attending: Hematology

## 2019-08-28 ENCOUNTER — Telehealth: Payer: Self-pay | Admitting: Hematology

## 2019-08-28 ENCOUNTER — Inpatient Hospital Stay (HOSPITAL_BASED_OUTPATIENT_CLINIC_OR_DEPARTMENT_OTHER): Payer: Medicare Other | Admitting: Hematology

## 2019-08-28 VITALS — BP 126/58 | HR 60 | Temp 97.5°F | Resp 18 | Ht 65.0 in | Wt 154.0 lb

## 2019-08-28 DIAGNOSIS — K5900 Constipation, unspecified: Secondary | ICD-10-CM

## 2019-08-28 DIAGNOSIS — D539 Nutritional anemia, unspecified: Secondary | ICD-10-CM

## 2019-08-28 DIAGNOSIS — F0280 Dementia in other diseases classified elsewhere without behavioral disturbance: Secondary | ICD-10-CM

## 2019-08-28 DIAGNOSIS — C221 Intrahepatic bile duct carcinoma: Secondary | ICD-10-CM | POA: Insufficient documentation

## 2019-08-28 DIAGNOSIS — G309 Alzheimer's disease, unspecified: Secondary | ICD-10-CM | POA: Insufficient documentation

## 2019-08-28 DIAGNOSIS — Z510 Encounter for antineoplastic radiation therapy: Secondary | ICD-10-CM | POA: Diagnosis not present

## 2019-08-28 LAB — CMP (CANCER CENTER ONLY)
ALT: 10 U/L (ref 0–44)
AST: 13 U/L — ABNORMAL LOW (ref 15–41)
Albumin: 4 g/dL (ref 3.5–5.0)
Alkaline Phosphatase: 81 U/L (ref 38–126)
Anion gap: 8 (ref 5–15)
BUN: 19 mg/dL (ref 8–23)
CO2: 28 mmol/L (ref 22–32)
Calcium: 9.2 mg/dL (ref 8.9–10.3)
Chloride: 103 mmol/L (ref 98–111)
Creatinine: 1.12 mg/dL (ref 0.61–1.24)
GFR, Est AFR Am: >60 mL/min (ref >60)
GFR, Estimated: >60 mL/min (ref >60)
Glucose, Bld: 132 mg/dL — ABNORMAL HIGH (ref 70–99)
Potassium: 4.4 mmol/L (ref 3.5–5.1)
Sodium: 139 mmol/L (ref 135–145)
Total Bilirubin: 0.5 mg/dL (ref 0.3–1.2)
Total Protein: 6.5 g/dL (ref 6.5–8.1)

## 2019-08-28 LAB — CBC WITH DIFFERENTIAL (CANCER CENTER ONLY)
Abs Immature Granulocytes: 0.03 10*3/uL (ref 0.00–0.07)
Basophils Absolute: 0 10*3/uL (ref 0.0–0.1)
Basophils Relative: 1 %
Eosinophils Absolute: 0.1 10*3/uL (ref 0.0–0.5)
Eosinophils Relative: 1 %
HCT: 37.8 % — ABNORMAL LOW (ref 39.0–52.0)
Hemoglobin: 12.6 g/dL — ABNORMAL LOW (ref 13.0–17.0)
Immature Granulocytes: 1 %
Lymphocytes Relative: 18 %
Lymphs Abs: 1 10*3/uL (ref 0.7–4.0)
MCH: 35 pg — ABNORMAL HIGH (ref 26.0–34.0)
MCHC: 33.3 g/dL (ref 30.0–36.0)
MCV: 105 fL — ABNORMAL HIGH (ref 80.0–100.0)
Monocytes Absolute: 0.6 10*3/uL (ref 0.1–1.0)
Monocytes Relative: 10 %
Neutro Abs: 4 10*3/uL (ref 1.7–7.7)
Neutrophils Relative %: 69 %
Platelet Count: 192 10*3/uL (ref 150–400)
RBC: 3.6 MIL/uL — ABNORMAL LOW (ref 4.22–5.81)
RDW: 14.6 % (ref 11.5–15.5)
WBC Count: 5.7 10*3/uL (ref 4.0–10.5)
nRBC: 0 % (ref 0.0–0.2)

## 2019-08-28 NOTE — Progress Notes (Signed)
[place] OFFICE PROGRESS NOTE  Patient Care Team: [name], MD as PCP - General (Family Medicine) Josue Hector, MD as PCP - Cardiology (Cardiology) Kathie Rhodes, MD as Consulting Physician (Urology) Lavonna Monarch, MD as Consulting Physician (Dermatology) Jackquline Denmark, MD as Consulting Physician (Gastroenterology) Milus Banister, MD as Consulting Physician (Gastroenterology) Gerrit Halls, PA-C as Physician Assistant (Orthopedic Surgery) Sydnee Cabal, MD as Consulting Physician (Orthopedic Surgery) Pedro Earls, MD as Consulting Physician (Sports Medicine) Kyung Rudd, MD as Consulting Physician (Radiation Oncology) Tish Men, MD as Consulting Physician (Hematology)  HEME/ONC OVERVIEW: 1. 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 Ardis Hughs) or IR bx (Dr. Reesa Chew) -08/2018: persistent FDG uptake in the liver without progression or metastatic disease; CA 19-9 normal -06/2019:  Persistent duct dilatation in segment 8 of the R hepatic lobe w/ possible central obstruction on MRI abdomen; no new lesion   ERCP with brushing; path showed high-grade glandular dysplagia with microscopic focus of invasive adenocarcinoma   CT triple-phase showed ill-defined area in the central right liver, no abdominal lymphadenopathy -08/2019: SBRT to the liver lesion, 5 fractions   TREATMENT REGIMEN:  08/18/2019 - 08/28/2019: SBRT x 5 fractions to the liver lesion   PERTINENT NON-HEM/ONC PROBLEMS: 1. CAD s/p 3-vessel CABG in 2012  2. NASH  3. 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 -Not a candidate for  ablative therapy by IR -Patient started SBRT on 08/18/2019 and has tolerated it well; last treatment today -I discussed with the patient and his spouse at length regarding assessing disease response and surveillance -His previous CT and MRI have not been able to identify the cholangiocarcinoma; therefore, PET would be to provide more accurate assessment disease response post-SBRT, as the malignancy has been shown to be hypermetabolic on previous PET -As radiation may have persistent inflammation for several months after completion of treatment, and his disease appears to be somewhat indolent, I have ordered PET in ~6 months (i.e. 02/2020) to assess disease response and monitor for any recurrent/progressive disease -I also discussed the importance of balancing the need to monitor any recurrent disease with the patient's advanced age and other comorbidities, including moderate to severe Alzheimer's dementia.  -If PET scan shows worsening disease, there is a serious doubt regarding the patient's ability to tolerate any systemic treatment -If PET does not show any evidence of definite disease progression in 02/2020, then we can order CT for periodic disease surveillance, no more often than every 6 months   Macrocytic anemia -Possibly due to underlying liver disease -Hgb 12.6 today, stable; nutritional studies, including iron, B12, folate, were normal -Clinically, patient denies any symptoms of bleeding -We will monitor it for now   Moderate to severe Alzheimer's dementia -According to the patient's spouse, his dementia has been slowly progressing over the past few months -As discussed above, the patient is unlikely to be able to tolerate any systemic therapy, and therefore we will avoid unnecessary frequent imaging surveillance, as it would unlikely change the management  Constipation -I counseled the patient on the importance of maintaining soft BMs, and discussed a variety of OTC bowel regimen,  including Colace, MiraLAX, senna, and milk of magnesia  Orders Placed This Encounter  Procedures  . NM PET Image Restag (PS) Skull Base To Thigh    Standing Status:   Future    Standing Expiration Date:   08/27/2020    Order Specific Question:   ** REASON FOR EXAM (FREE TEXT)    Answer:   cholangiocarcinoma s/p SBRT, monitor disease response and surveillance    Order Specific Question:   If indicated for the ordered procedure, I authorize the administration of a radiopharmaceutical per Radiology protocol    Answer:   Yes    Order Specific Question:   Preferred imaging location?    Answer:   [facility]    Order Specific Question:   Radiology Contrast Protocol - do NOT remove file path    Answer:   [email or web address]  . CA 19.9    Standing Status:   Future    Standing Expiration Date:   08/27/2020  . CBC with Differential (Cancer Center Only)    Standing Status:   Future    Standing Expiration Date:   10/01/2020  . CMP ([facility] only)    Standing Status:   Future    Standing Expiration Date:   10/01/2020   All questions were answered. The patient knows to call the clinic with any problems, questions or concerns. No barriers to learning was detected.  Return in 6 months for labs, imaging results and clinic appt.  Tish Men, MD 08/28/2019 10:40 AM  CHIEF COMPLAINT: "I am fine"  INTERVAL HISTORY: Ricardo Washington returns to clinic for follow-up of cholangiocarcinoma.  He was accompanied by his spouse due to his moderately severe Alzheimer's dementia.  Patient started SBRT for intrahepatic cholangiocarcinoma on 08/18/2019, with a plan for 5 fractions (last treatment today).  He has tolerated treatment well with minimal side effects.  He has periodic constipation, for which he was instructed to take to milk of magnesia for several days, followed by MiraLAX daily, but he has not been taking MiraLAX as recommended.  According to his spouse, his dementia has  also been steadily progressing over the past few months, and he has been waking up periodically in the middle of the night, thinking that it is already morning.  He denies any other complaint today.  REVIEW OF SYSTEMS:   Constitutional: ( - ) fevers, ( - )  chills , ( - ) night sweats Eyes: ( - ) blurriness of vision, ( - ) double vision, ( - ) watery eyes Ears, nose, mouth, throat, and face: ( - ) mucositis, ( - ) sore throat Respiratory: ( - ) cough, ( - ) dyspnea, ( - ) wheezes Cardiovascular: ( - ) palpitation, ( - ) chest discomfort, ( - ) lower extremity swelling Gastrointestinal:  ( - ) nausea, ( - ) heartburn, ( - ) change in bowel habits Skin: ( - ) abnormal skin rashes Lymphatics: ( - ) new lymphadenopathy, ( - ) easy bruising Neurological: ( - ) numbness, ( - ) tingling, ( - ) new weaknesses Behavioral/Psych: ( - ) mood change, ( - ) new changes  All other systems were reviewed with the patient and are negative.  SUMMARY OF ONCOLOGIC HISTORY: Oncology History  Cholangiocarcinoma [facility])   Initial Diagnosis   Cholangiocarcinoma ([place])   04/30/2018 Imaging   CT abdomen/pelvis w/ contrast: IMPRESSION: 1. There are findings in the distal sigmoid colon which could be related to acute diverticulitis. However, there is also mass-like thickening of the colon throughout this region. This may  simply be reflective of a combination of acute and chronic diverticular disease, however, correlation with nonemergent colonoscopy is recommended after resolution of the patient's acute illness to exclude the possibility of colorectal neoplasm. 2. Focal intrahepatic biliary ductal dilatation most evident in segment 8 of the liver where there is also some regional atrophy. Ill-defined hypovascular area near the hepatic hilum. The possibility of a central neoplasm should be considered, and further evaluation with nonemergent MRI of the abdomen with and without IV gadolinium is strongly recommended  in the near future. 3. Aortic atherosclerosis, in addition to 2 vessel coronary artery disease. 4. Additional incidental findings, as above.   05/03/2018 Imaging   MRI abdomen: 1. Mild motion degradation. 2. Intrahepatic duct dilatation centered in the periphery of segment 8 with a central area of subtle ductal caught off, probable mild T2 hyperintensity, and delayed post-contrast hypointensity or hypoenhancement. 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 Potential clinical strategies include focused ultrasound to allow possible sampling, PET, or if patient is not a good biopsy or surgical candidate, follow-up pre and post contrast abdominal MRI at 3 months. 3.  Aortic Atherosclerosis (ICD10-I70.0).   05/19/2018 Imaging   PET: IMPRESSION: 1. The corresponding to the MR abnormality, there is a focal area of increased uptake within the central aspect of segment 8 which is suspicious for underlying neoplasm, favor cholangiocarcinoma. 2. No additional areas of increased uptake identified to suggest metastatic disease.   08/20/2018 Imaging   PET: IMPRESSION: 1. Focus of activity centrally within liver remains but is slightly less intense and less focal. No clear evidence of new lesions within the liver. 2. No evidence of metastatic disease outside the liver. 3. Extensive activity in the bowel is physiologic and could mask a colon lesion.   06/24/2019 Imaging   MRI abdomen: IMPRESSION: 1. Persistent duct dilatation in segment 8/6 of the RIGHT hepatic lobe with suspicion of region central obstruction identified by hypermetabolic tissue on comparison FDG PET scan. The MRI findings are very similar to MRI of 05/03/2018. The tumor is very poorly demonstrated on current exam. There is some limitation to the imaging due to patient body motion. 2. Normal distal common bile duct.  Normal pancreas.   06/26/2019 Procedure   ERCP: Impression:       - J-shaped deformity in the  entire stomach.                           - Duodenal diverticulum.                           - The major papilla appeared normal.                           - The fluoroscopic examination was suspicious for                            sludge.                           - A single mild biliary narrowing was found in the                            right main hepatic duct region. The stricture was  indeterminate. This was brushed.                           - The right main hepatic duct was moderately                            dilated.                           - Choledocholithiasis, hemobilia, biliary sludge                            was found. Complete removal was accomplished by                            biliary sphincterotomy and balloon sphincteroplasty.                           - Tissue from within the biliary tree was also                            suctioned and placed into 2 separate jars for                            histology purposes.                           - One plastic biliary stent was placed into the                            right hepatic duct in order to attempt to traverse                            the biliary narrowing.   06/26/2019 Pathology Results   Accession: [id number]  1. Common Bile Duct , #1 - AT LEAST HIGH GRADE GLANDULAR DYSPLASIA. - SEE COMMENT. 2. Common Bile Duct , # 2 - HIGH GRADE GLANDULAR DYSPLASIA WITH MICROSCOPIC FOCI OF INVASIVE ADENOCARCINOMA. - SEE COMMENT.   07/09/2019 Imaging   CT abdomen/pelvis triple phase: IMPRESSION: 1. Ill-defined subtle area of differential perfusion identified in the central right liver, corresponding to the location of the abnormal hypermetabolism seen on the previous PET-CT. Imaging features compatible with the patient's known neoplasm and there is associated mild intrahepatic biliary duct dilatation mainly in segments VII and VIII. 2. Minimal gallbladder wall thickening versus trace  pericholecystic fluid. 3. No evidence for lymphadenopathy in the abdomen or pelvis. 4. Left colonic diverticulosis without diverticulitis. 5.  Aortic Atherosclerois (ICD10-170.0)     I have reviewed the past medical history, past surgical history, social history and family history with the patient and they are unchanged from previous note.  ALLERGIES:  is allergic to sulfonamide derivatives.  MEDICATIONS:  Current Outpatient Medications  Medication Sig Dispense Refill  . allopurinol (ZYLOPRIM) 100 MG tablet TAKE ONE TABLET BY MOUTH ON MONDAY, WEDNESDAY, AND FRIDAY 36 tablet 3  . ALPRAZolam (NIRAVAM) 0.25 MG dissolvable tablet Take 0.25 mg by mouth at bedtime as needed for anxiety.    Marland Kitchen aspirin 325 MG tablet Take 325 mg by  mouth daily.      . betamethasone dipropionate (DIPROLENE) 0.05 % cream Apply 1 application topically daily.     . ferrous sulfate 325 (65 FE) MG EC tablet Take 325 mg by mouth daily with breakfast.    . finasteride (PROSCAR) 5 MG tablet TAKE ONE TABLET BY MOUTH EVERY DAY 90 tablet 0  . Lancets (ONETOUCH ULTRASOFT) lancets Use to check blood sugar once daily 100 each 12  . lisinopril (ZESTRIL) 5 MG tablet TAKE ONE TABLET BY MOUTH DAILY 90 tablet 1  . metFORMIN (GLUCOPHAGE-XR) 500 MG 24 hr tablet TAKE ONE TABLET BY MOUTH TWICE DAILY WITH MEALS (Patient taking differently: Take 500 mg by mouth 2 (two) times daily with a meal. ) 180 tablet 1  . metoprolol tartrate (LOPRESSOR) 25 MG tablet TAKE ONE TABLET BY MOUTH TWICE DAILY (Patient taking differently: Take 25 mg by mouth 2 (two) times daily. ) 60 tablet 5  . omeprazole (PRILOSEC) 40 MG capsule Take 1 capsule (40 mg total) by mouth daily. 30 capsule 3  . PREVIDENT 5000 PLUS 1.1 % CREA dental cream Place 1 application onto teeth at bedtime.     . rosuvastatin (CRESTOR) 10 MG tablet TAKE ONE TABLET BY MOUTH EVERY DAY (Patient taking differently: Take 10 mg by mouth daily. ) 90 tablet 0  . tamsulosin (FLOMAX) 0.4 MG CAPS  capsule TAKE ONE CAPSULE BY MOUTH DAILY 90 capsule 3  . Trospium Chloride 60 MG CP24 TAKE ONE CAPSULE BY MOUTH DAILY 90 capsule 1  . vitamin B-12 (CYANOCOBALAMIN) 1000 MCG tablet Take 1,000 mcg by mouth daily.    . MULTIPLE VITAMIN PO Take 1 tablet by mouth daily.     . nitroGLYCERIN (NITROSTAT) 0.4 MG SL tablet Place 1 tablet (0.4 mg total) under the tongue every 5 (five) minutes as needed for chest pain. (Patient not taking: Reported on 05/21/2019) 25 tablet 3   No current facility-administered medications for this visit.     PHYSICAL EXAMINATION: ECOG PERFORMANCE STATUS: 2-3   Today's Vitals   08/28/19 1012  BP: (!) 126/58  Pulse: 60  Resp: 18  Temp: (!) 97.5 F (36.4 C)  TempSrc: Temporal  SpO2: 100%  Weight: 154 lb (69.9 kg)  Height: 5' 5"  (1.651 m)  PainSc: 0-No pain   Body mass index is 25.63 kg/m.  Filed Weights   08/28/19 1012  Weight: 154 lb (69.9 kg)    GENERAL: alert, no distress and comfortable SKIN: skin color, texture, turgor are normal, no rashes or significant lesions EYES: conjunctiva are pink and non-injected, sclera clear OROPHARYNX: no exudate, no erythema; lips, buccal mucosa, and tongue normal  NECK: supple, non-tender LUNGS: clear to auscultation with normal breathing effort HEART: regular rate & rhythm and no murmurs and no lower extremity edema ABDOMEN: soft, non-tender, non-distended, normal bowel sounds Musculoskeletal: no cyanosis of digits and no clubbing  PSYCH: alert, slowed speech  LABORATORY DATA:  I have reviewed the data as listed    Component Value Date/Time   NA 139 08/28/2019 0955   K 4.4 08/28/2019 0955   CL 103 08/28/2019 0955   CO2 28 08/28/2019 0955   GLUCOSE 132 (H) 08/28/2019 0955   BUN 19 08/28/2019 0955   CREATININE 1.12 08/28/2019 0955   CREATININE 1.03 02/08/2017 1631   CALCIUM 9.2 08/28/2019 0955   PROT 6.5 08/28/2019 0955   ALBUMIN 4.0 08/28/2019 0955   AST 13 (L) 08/28/2019 0955   ALT 10 08/28/2019 0955    ALKPHOS 81 08/28/2019 0955  BILITOT 0.5 08/28/2019 0955   GFRNONAA >60 08/28/2019 0955   GFRAA >60 08/28/2019 0955    No results found for: SPEP, UPEP  Lab Results  Component Value Date   WBC 5.7 08/28/2019   NEUTROABS 4.0 08/28/2019   HGB 12.6 (L) 08/28/2019   HCT 37.8 (L) 08/28/2019   MCV 105.0 (H) 08/28/2019   PLT 192 08/28/2019      Chemistry      Component Value Date/Time   NA 139 08/28/2019 0955   K 4.4 08/28/2019 0955   CL 103 08/28/2019 0955   CO2 28 08/28/2019 0955   BUN 19 08/28/2019 0955   CREATININE 1.12 08/28/2019 0955   CREATININE 1.03 02/08/2017 1631      Component Value Date/Time   CALCIUM 9.2 08/28/2019 0955   ALKPHOS 81 08/28/2019 0955   AST 13 (L) 08/28/2019 0955   ALT 10 08/28/2019 0955   BILITOT 0.5 08/28/2019 0955       RADIOGRAPHIC STUDIES: I have personally reviewed the radiological images as listed below and agreed with the findings in the report. Ir Radiologist Eval & Mgmt  Result Date: 07/29/2019 Please refer to notes tab for details about interventional procedure. (Op Note)

## 2019-08-28 NOTE — Telephone Encounter (Signed)
Appointments scheduled and I advised patient that I would contact him regarding scheduling his PET Scan for early April 2020. Calendar printed per 10/9 los

## 2019-09-15 ENCOUNTER — Encounter: Payer: Self-pay | Admitting: Family Medicine

## 2019-09-20 DIAGNOSIS — E039 Hypothyroidism, unspecified: Secondary | ICD-10-CM

## 2019-09-20 HISTORY — DX: Hypothyroidism, unspecified: E03.9

## 2019-09-28 ENCOUNTER — Telehealth: Payer: Self-pay | Admitting: Radiation Oncology

## 2019-09-28 NOTE — Telephone Encounter (Signed)
  Radiation Oncology         [phone] ________________________________  Name: Ricardo Washington MRN: [id number]  Date of Service: 09/28/2019  DOB: Nov 26, 1934  Post Treatment Telephone Note  Diagnosis:   Stage IA, cT1aN0M0 intrahepatic cholangiocarcinoma of the right hepatic lobe.   Interval Since Last Radiation: 5 weeks   08/18/2019-08/28/2019 SBRT Treatment: The intrahepatic target was treated to 50 Gy in 5 fractions.  Narrative:  The patient was contacted today for routine follow-up. During treatment he did very well with radiotherapy and did not have significant desquamation. He is still losing weight but his wife attributes this to his progressive dementia. He will be seen by his PCP this week to decide if he needs to proceed with medication. He otherwise has not had abdominal pain or significant fatigue.  Impression/Plan: 1. Stage IA, cT1aN0M0 intrahepatic cholangiocarcinoma of the right hepatic lobe. The patient has been doing well since completion of radiotherapy. We discussed that we would be happy to continue to follow him as needed, but he will also continue to follow up with Dr. Maylon Peppers in medical oncology and will have repeat PET imaging in April 2021. 2. Dementia He will continue with his PCP but this is considered as it relates to his disease and whether he would be able to undergo any further treatment. 3. Indwelling biliary stent. The patient has a plastic stent. His wife asked if this needs to be exchanged for another metal stent. I'll copy my notes to Dr. Rush Landmark to find out.    Carola Rhine, PAC

## 2019-09-29 NOTE — Telephone Encounter (Signed)
Thanks Gabe I should be able to see him in the next 1-2 weeks. RG   Bre, Can you please work him into my clinic in the next 1 to 2 weeks. Thx  RG

## 2019-09-29 NOTE — Telephone Encounter (Signed)
Bryson Ha, Thank you for reaching out. We do change out these plastic stents after a period of 3-6 months. If patients are doing very well, sometimes we can wait until the later portion of the life of the stents. I'm putting Dr. [name] on this message thread, his primary GI, so that we can get him back in clinic with Dr. [name] and then he and I can decide upon timing of stent exchange vs potential stent exchange to permanent stenting based on how his cholangiogram looks. RG will you be able to see patient in the next few weeks and then discuss with them about ERCP stent exchange if they are willing to go through with it and then you and I can decide how to tackle his case? Thanks again for reaching out. GM

## 2019-09-30 NOTE — Telephone Encounter (Signed)
Called and spoke with patient's wife-Joanne-verified DPR-Joanne informed of MD recommendations; Mechele Claude is agreeable with plan of care and has scheduled the patient for a f/u OV on 10/02/2019 at 4:00 pm; Patient verbalized understanding of information/instructions;  Patient was advised to call the office at [phone] if questions/concerns arise;

## 2019-10-02 ENCOUNTER — Other Ambulatory Visit: Payer: Self-pay

## 2019-10-02 ENCOUNTER — Ambulatory Visit (INDEPENDENT_AMBULATORY_CARE_PROVIDER_SITE_OTHER): Payer: Medicare Other | Admitting: Gastroenterology

## 2019-10-02 ENCOUNTER — Other Ambulatory Visit: Payer: Self-pay | Admitting: Family Medicine

## 2019-10-02 ENCOUNTER — Encounter: Payer: Self-pay | Admitting: Gastroenterology

## 2019-10-02 ENCOUNTER — Ambulatory Visit (INDEPENDENT_AMBULATORY_CARE_PROVIDER_SITE_OTHER): Payer: Medicare Other | Admitting: Family Medicine

## 2019-10-02 ENCOUNTER — Encounter: Payer: Self-pay | Admitting: Family Medicine

## 2019-10-02 VITALS — BP 97/62 | HR 51 | Temp 98.2°F | Resp 16 | Ht 65.0 in | Wt 152.2 lb

## 2019-10-02 VITALS — BP 108/60 | HR 60 | Temp 97.7°F | Ht 65.0 in | Wt 152.4 lb

## 2019-10-02 DIAGNOSIS — I10 Essential (primary) hypertension: Secondary | ICD-10-CM

## 2019-10-02 DIAGNOSIS — F0390 Unspecified dementia without behavioral disturbance: Secondary | ICD-10-CM

## 2019-10-02 DIAGNOSIS — I2510 Atherosclerotic heart disease of native coronary artery without angina pectoris: Secondary | ICD-10-CM

## 2019-10-02 DIAGNOSIS — I959 Hypotension, unspecified: Secondary | ICD-10-CM | POA: Diagnosis not present

## 2019-10-02 DIAGNOSIS — E039 Hypothyroidism, unspecified: Secondary | ICD-10-CM

## 2019-10-02 DIAGNOSIS — E038 Other specified hypothyroidism: Secondary | ICD-10-CM

## 2019-10-02 DIAGNOSIS — R160 Hepatomegaly, not elsewhere classified: Secondary | ICD-10-CM | POA: Diagnosis not present

## 2019-10-02 DIAGNOSIS — K8309 Other cholangitis: Secondary | ICD-10-CM | POA: Diagnosis not present

## 2019-10-02 DIAGNOSIS — E119 Type 2 diabetes mellitus without complications: Secondary | ICD-10-CM

## 2019-10-02 LAB — BASIC METABOLIC PANEL
BUN: 16 mg/dL (ref 6–23)
CO2: 25 mEq/L (ref 19–32)
Calcium: 8.6 mg/dL (ref 8.4–10.5)
Chloride: 108 mEq/L (ref 96–112)
Creatinine, Ser: 1.01 mg/dL (ref 0.40–1.50)
GFR: 70.58 mL/min (ref >60.00)
Glucose, Bld: 117 mg/dL — ABNORMAL HIGH (ref 70–99)
Potassium: 4.5 mEq/L (ref 3.5–5.1)
Sodium: 142 mEq/L (ref 135–145)

## 2019-10-02 LAB — HEMOGLOBIN A1C: Hgb A1c MFr Bld: 5.9 % (ref 4.6–6.5)

## 2019-10-02 LAB — T4, FREE: Free T4: 0.58 ng/dL — ABNORMAL LOW (ref 0.60–1.60)

## 2019-10-02 LAB — TSH: TSH: 5.11 u[IU]/mL — ABNORMAL HIGH (ref 0.35–4.50)

## 2019-10-02 NOTE — Patient Instructions (Signed)
Your provider has requested that you go to the basement level for lab work at [facility] in [place] [address]. Press "B" on the elevator. The lab is located at the first door on the left as you exit the elevator.  Thank you,  Dr. Jackquline Denmark

## 2019-10-02 NOTE — Progress Notes (Signed)
OFFICE VISIT  10/02/2019   CC:  Chief Complaint  Patient presents with  . Follow-up    RCI, pt is not fasting   HPI:    Patient is a 84 y.o. Caucasian male who presents for f/u DM, HTN, and dementia w/out behavioral disturbance. Hx of mild subclinical hypothyroidism and I'll be doing f/u thyroid panel today. 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  Getting radiation via Dr. Lisbeth Renshaw, though->most recent treatment 08/11/19.  DM: no home glucose monitoring.  Feet: no burning, tingling, or numbness in feet.  HTN: no home bp monitoring.  Seems to be more tired/mopy and wife thinks this is possibly due to some low bp's.  He is eating and drinking well.  No falls.  No pain anywhere.  Dementia: wife says short term memory getting worse.  Asks frequently what the plan is for the day even if he has been told several times already.  Gets irritable easier lately.  He drives but never alone.  Tried to get something simple in grocery the other day and came back out and told wife he couldn't find it. Puts dishes away and you'll never know where you'll find them. He still recognizes people he knows, still can take care of himself/do ADLs.  He does dishes, helps with putting away laundry.   He has not responded to aricept, namenda, or exelon.     Some constipation chronically but a bit worse lately: taking metamucil daily now and this helps.  ROS: no CP, no SOB, no wheezing, no cough, no dizziness, no HAs, no rashes, no melena/hematochezia.  No polyuria or polydipsia.  No myalgias or arthralgias.  Past Medical History:  Diagnosis Date  . BPH with obstruction/lower urinary tract symptoms 10/27/2015   Dr. Karsten Ro  . Cataract   . Cholangiocarcinoma ([place]) 2020   Mass detected 04/2018.  Intrahepatic cholangiocarcinoma of R hepatic lobe; stage 1A, cT1aN0M0->not candidate for intervention or chemo, Dr. Mammie Lorenzo  . Colon wall  thickening 04/2018   "mass-like" per radiologist interpretation; colonoscopy as next step---f/u colonoscopy showed 'tics but o/w normal.  NO further colonoscopies needed due to age.  . Coronary artery disease    with preserved LV function.  . Dementia ([place])   . Diabetes mellitus without complication ([place])   . Diverticulosis of colon    severe, entire colon  . Dysphagia 11/2018   Barium swallow: mild esophageal dysmotility.  . Ectatic abdominal aorta ([place]) 01/2017   Abd u/s: 2.9 cm aortic ectasia--at risk for aneurism development.  Recheck aortic u/s 5 yrs.  . Erectile dysfunction due to arterial insufficiency   . Fatigue   . Gout    always 2nd toe L foot (uric acid 6.20 Dec 2014 per old records)  . History of adenomatous polyp of colon 2002  . History of stomach ulcers   . Hyperlipidemia   . Hypertension   . Hypogonadism male   . Klebsiella sepsis ([place]) 01/2017   due to acute biliary tract infection (no stones) and acute diverticulitis.  . Macrocytic anemia 01/2017   vit B12 borderline low and iron borderline low: checking hemoccults and starting vit B12 PO and iron PO as of 02/11/17.  Vit B12 and folate normal as of GI f/u 06/2018.  . Myogenic ptosis of bilateral eyelids 2018   Plastic surgery in [place], [place] to do surg as of 03/2017.  Marland Kitchen NASH (nonalcoholic steatohepatitis) 06/2017   LFTs up, abd u/s showed  fatty liver but no other abnormality.  . Osteoarthritis of right shoulder 09/2018   Near end-stage --->glenohumeral joint-->intra-articular steroid injection 09/2018 (Dr. Delilah Shan).  11/2018-->end stage, but not ready for total shoulder replacement.  . Past use of tobacco    quit 1984  . Rectal bleeding   . Rosacea   . S/P coronary artery bypass graft x 3   . Urine incontinence    Dr. Karsten Ro    Past Surgical History:  Procedure Laterality Date  . BILIARY BRUSHING  06/26/2019   PATH: invasive adenocarcinoma.  Procedure: BILIARY BRUSHING;  Surgeon: Rush Landmark Telford Nab., MD;   Location: [place];  Service: Gastroenterology;;  . BILIARY DILATION  06/26/2019   Procedure: BILIARY DILATION;  Surgeon: Irving Copas., MD;  Location: [place];  Service: Gastroenterology;;  . BILIARY STENT PLACEMENT  06/26/2019   Procedure: [place];  Surgeon: Irving Copas., MD;  Location: [place];  Service: Gastroenterology;;  . CARDIOVASCULAR STRESS TEST  08/28/2016   Low risk myoview, normal EF, no ischemia.  Marland Kitchen CATARACT EXTRACTION, BILATERAL    . COLONOSCOPY  2002, 2006, 02/25/2009; 06/11/18   Polyp x 1 2002, none 2006 or 2010.  Adenomatous polyp 06/11/18+  signif diverticulosis.  NO FURTHER COLONOSCOPIES DUE TO AGE.  Marland Kitchen CORONARY ARTERY BYPASS GRAFT  06/2009   descending,saphenous vein graft to first obtuse marginal, sequential saphenous vein graft to posterior descending and posterolateral  . Endoscopic vein harvest right thigh    . ERCP N/A 06/26/2019   Procedure: ENDOSCOPIC RETROGRADE CHOLANGIOPANCREATOGRAPHY (ERCP);  Surgeon: Irving Copas., MD;  Location: [facility];  Service: Gastroenterology;  Laterality: N/A;  with stent   . IR RADIOLOGIST EVAL & MGMT  06/12/2018  . IR RADIOLOGIST EVAL & MGMT  09/18/2018  . IR RADIOLOGIST EVAL & MGMT  05/26/2019  . IR RADIOLOGIST EVAL & MGMT  07/29/2019  . PTCA    . REMOVAL OF STONES  06/26/2019   Procedure: REMOVAL OF STONES;  Surgeon: Rush Landmark Telford Nab., MD;  Location: [facility];  Service: Gastroenterology;;  . Joan Mayans  06/26/2019   Procedure: Joan Mayans;  Surgeon: Irving Copas., MD;  Location: [facility];  Service: Gastroenterology;;  . TONSILLECTOMY    . UMBILICAL HERNIA REPAIR     2009  . VASECTOMY      Outpatient Medications Prior to Visit  Medication Sig Dispense Refill  . allopurinol (ZYLOPRIM) 100 MG tablet TAKE ONE TABLET BY MOUTH ON MONDAY, WEDNESDAY, AND FRIDAY 36 tablet 3  . ALPRAZolam (NIRAVAM) 0.25 MG dissolvable tablet Take 0.25 mg by mouth at bedtime as  needed for anxiety.    Marland Kitchen aspirin 325 MG tablet Take 325 mg by mouth daily.      . betamethasone dipropionate (DIPROLENE) 0.05 % cream Apply 1 application topically daily.     . ferrous sulfate 325 (65 FE) MG EC tablet Take 325 mg by mouth daily with breakfast.    . finasteride (PROSCAR) 5 MG tablet TAKE ONE TABLET BY MOUTH EVERY DAY 90 tablet 0  . Lancets (ONETOUCH ULTRASOFT) lancets Use to check blood sugar once daily 100 each 12  . lisinopril (ZESTRIL) 5 MG tablet TAKE ONE TABLET BY MOUTH DAILY 90 tablet 1  . metFORMIN (GLUCOPHAGE-XR) 500 MG 24 hr tablet TAKE ONE TABLET BY MOUTH TWICE DAILY WITH MEALS (Patient taking differently: Take 500 mg by mouth 2 (two) times daily with a meal. ) 180 tablet 1  . metoprolol tartrate (LOPRESSOR) 25 MG tablet TAKE ONE TABLET BY MOUTH TWICE  DAILY (Patient taking differently: Take 25 mg by mouth 2 (two) times daily. ) 60 tablet 5  . MULTIPLE VITAMIN PO Take 1 tablet by mouth daily.     Marland Kitchen omeprazole (PRILOSEC) 40 MG capsule Take 1 capsule (40 mg total) by mouth daily. 30 capsule 3  . PREVIDENT 5000 PLUS 1.1 % CREA dental cream Place 1 application onto teeth at bedtime.     . rosuvastatin (CRESTOR) 10 MG tablet TAKE ONE TABLET BY MOUTH EVERY DAY (Patient taking differently: Take 10 mg by mouth daily. ) 90 tablet 0  . tamsulosin (FLOMAX) 0.4 MG CAPS capsule TAKE ONE CAPSULE BY MOUTH DAILY 90 capsule 3  . Trospium Chloride 60 MG CP24 TAKE ONE CAPSULE BY MOUTH DAILY 90 capsule 1  . vitamin B-12 (CYANOCOBALAMIN) 1000 MCG tablet Take 1,000 mcg by mouth daily.    . nitroGLYCERIN (NITROSTAT) 0.4 MG SL tablet Place 1 tablet (0.4 mg total) under the tongue every 5 (five) minutes as needed for chest pain. (Patient not taking: Reported on 05/21/2019) 25 tablet 3   No facility-administered medications prior to visit.     Allergies  Allergen Reactions  . Sulfonamide Derivatives Nausea Only    ROS As per HPI  PE: Blood pressure 97/62, pulse (!) 51, temperature 98.2 F  (36.8 C), temperature source Temporal, resp. rate 16, height 5' 5"  (1.651 m), weight 152 lb 3.2 oz (69 kg), SpO2 98 %. Gen: Alert, well appearing.  Patient is oriented to person, place, time, and situation. AFFECT: pleasant, lucid thought and speech. No jaundice or pallor. CV: RRR, no m/r/g.   LUNGS: CTA bilat, nonlabored resps, good aeration in all lung fields. ABD: soft, NT/ND EXT: no clubbing or cyanosis.  no edema.  Foot exam - bilateral normal; no swelling, tenderness or skin or vascular lesions. Color and temperature is normal. Sensation is intact. Peripheral pulses are palpable. Toenails are normal.   LABS:   Lab Results  Component Value Date   LABURIC 6.1 08/05/2012    Lab Results  Component Value Date   TSH 5.24 (H) 07/10/2018   T3TOTAL 82 07/10/2018    Lab Results  Component Value Date   WBC 5.7 08/28/2019   HGB 12.6 (L) 08/28/2019   HCT 37.8 (L) 08/28/2019   MCV 105.0 (H) 08/28/2019   PLT 192 08/28/2019   Lab Results  Component Value Date   IRON 44 07/03/2019   TIBC 285 07/03/2019   FERRITIN 168 07/03/2019   Lab Results  Component Value Date   VITAMINB12 1,966 (H) 07/03/2019    Lab Results  Component Value Date   CREATININE 1.12 08/28/2019   BUN 19 08/28/2019   NA 139 08/28/2019   K 4.4 08/28/2019   CL 103 08/28/2019   CO2 28 08/28/2019   Lab Results  Component Value Date   ALT 10 08/28/2019   AST 13 (L) 08/28/2019   ALKPHOS 81 08/28/2019   BILITOT 0.5 08/28/2019   Lab Results  Component Value Date   CHOL 104 06/05/2019   Lab Results  Component Value Date   HDL 40.50 06/05/2019   Lab Results  Component Value Date   LDLCALC 52 06/05/2019   Lab Results  Component Value Date   TRIG 58.0 06/05/2019   Lab Results  Component Value Date   CHOLHDL 3 06/05/2019   Lab Results  Component Value Date   HGBA1C 6.1 06/05/2019    IMPRESSION AND PLAN:  1) HTN, with low norma or low bp typically when he  is in office, but is low today and  wife feels like he acts like it is low at home sometimes.  PO intake doesn't seem to be an issue. D/c lopressor and monitor bp at home.  2) DM 2, hx of good control.  He eats what he wants.  No exercise due to frailty. Feet exam normal today. Hba1c today. Lytes/cr today.  3) Dementia: gradually progressing memory problems but no signif behav disturbance or signif impairment in ADLs. Wife definitely needs to be with him 24/7. Failed trials of exelon, aricept, and exelon.  Neurologist referral offered but pt/wife declined.  4) subclinical hypothyroidism: monitor TSH and free 4 and T3 today.  An After Visit Summary was printed and given to the patient.  FOLLOW UP: Return in about 4 months (around 01/30/2020) for routine chronic illness f/u.  Signed:  Crissie Sickles, MD           10/02/2019

## 2019-10-02 NOTE — Progress Notes (Signed)
IMPRESSION and PLAN:    #1. Asymptomatic Stage 1A central hilar cholangioCA with R main hepatic duct mild stricture s/p ERCP with 10 Fr 12 cm stent insertion 06/26/2019 (Dr Rush Landmark). No Mets or extra hepatic disease.  No ascites. Neg CT/PET/MR for progression >1.5 yrs. Nl CA19-9. Not a candidate for any IR directed therapy including ablation/embolization (per IR, Dr Annamaria Boots), not a candidate for chemo (per oncology). S/P SBRT to liver lesion x 5 08/2019 (Dr Lisbeth Renshaw).  #2. Associated mod-severe Alzheimer's dementia.  Plan: - Add CMP, CA 19-9 to the blood drawn today. - Dr Rush Landmark for metal stent.   Wife would like to have it done Dec 2020. May have to order stents.       HPI:    Chief Complaint:   Ricardo Washington is a 84 y.o. male  For follow-up visit. Accompanied by his wife.  Doing great from GI standpoint.  No GI complaints.  Dementia getting worse.  Seen by Dr. Anitra Lauth today.  Had blood work ordered.  S/P SBRT x 5 fractions to the liver lesion by Dr. Lisbeth Renshaw.  Finished 08/28/2019.  Tolerated well.  As per Dr. Donneta Romberg previous note-recommend metal stent in December/January.  Patient's wife would like to get it done in December if possible.  No jaundice, dark urine, fever or chills.  No abdominal pain.  Having increasing memory problems.  For instance, for today's appointment with me, he got ready at 3 AM.  Wt Readings from Last 3 Encounters:  10/02/19 152 lb 6 oz (69.1 kg)  10/02/19 152 lb 3.2 oz (69 kg)  08/28/19 154 lb (69.9 kg)     Past GI procedures: Underwent colonoscopy on 06/11/2018-small colonic polyp, extensive diverticulosis. PET 08/20/2018-showed decreased SUV, no increase in size, no new lesions.  PET 08/20/2018: IMPRESSION: 1. Focus of activity centrally within liver remains but is slightly less intense and less focal. No clear evidence of new lesions within the liver. 2. No evidence of metastatic disease outside the liver. 3.  Extensive activity in the bowel is physiologic and could mask a colon lesion.   PET: 05/19/2018 IMPRESSION: 1. The corresponding to the MR abnormality, there is a focal area of increased uptake within the central aspect of segment 8 which is suspicious for underlying neoplasm, favor cholangiocarcinoma. 2. No additional areas of increased uptake identified to suggest metastatic disease.  MR 05/03/2018 1. Mild motion degradation. 2. Intrahepatic duct dilatation centered in the periphery of segment 8 with a central area of subtle ductal caught off, probable mild T2 hyperintensity, and delayed post-contrast hypointensity or hypoenhancement. 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 Potential clinical strategies include focused ultrasound to allow possible sampling, PET, or if patient is not a good biopsy or surgical candidate, follow-up pre and post contrast abdominal MRI at 3 months. 3.  Aortic Atherosclerosis (ICD10-I70.0).  [place] 05/20/2018 IMPRESSION: Slight increased echogenicity identified in the area of abnormality on recent MRI and PET-CT although no definitive mass is seen. Some right-sided ductal dilatation is noted similar to that seen on prior MRI.  Past Medical History:  Diagnosis Date  . BPH with obstruction/lower urinary tract symptoms 10/27/2015   Dr. Karsten Ro  . Cataract   . Cholangiocarcinoma ([place]) 2020   Mass detected 04/2018.  Intrahepatic cholangiocarcinoma of R hepatic lobe; stage 1A, cT1aN0M0->not candidate for intervention or chemo, Dr. Mammie Lorenzo  . Colon wall thickening 04/2018   "mass-like" per radiologist interpretation; colonoscopy as next step---f/u colonoscopy showed 'tics but o/w normal.  NO  further colonoscopies needed due to age.  . Coronary artery disease    with preserved LV function.  . Dementia ([place])   . Diabetes mellitus without complication ([place])   . Diverticulosis of colon    severe, entire colon  . Dysphagia 11/2018   Barium  swallow: mild esophageal dysmotility.  . Ectatic abdominal aorta ([place]) 01/2017   Abd u/s: 2.9 cm aortic ectasia--at risk for aneurism development.  Recheck aortic u/s 5 yrs.  . Erectile dysfunction due to arterial insufficiency   . Fatigue   . Gout    always 2nd toe L foot (uric acid 6.20 Dec 2014 per old records)  . History of adenomatous polyp of colon 2002  . History of stomach ulcers   . Hyperlipidemia   . Hypertension   . Hypogonadism male   . Klebsiella sepsis ([place]) 01/2017   due to acute biliary tract infection (no stones) and acute diverticulitis.  . Macrocytic anemia 01/2017   vit B12 borderline low and iron borderline low: checking hemoccults and starting vit B12 PO and iron PO as of 02/11/17.  Vit B12 and folate normal as of GI f/u 06/2018.  . Myogenic ptosis of bilateral eyelids 2018   Plastic surgery in [place], [place] to do surg as of 03/2017.  Marland Kitchen NASH (nonalcoholic steatohepatitis) 06/2017   LFTs up, abd u/s showed fatty liver but no other abnormality.  . Osteoarthritis of right shoulder 09/2018   Near end-stage --->glenohumeral joint-->intra-articular steroid injection 09/2018 (Dr. Delilah Shan).  11/2018-->end stage, but not ready for total shoulder replacement.  . Past use of tobacco    quit 1984  . Rectal bleeding   . Rosacea   . S/P coronary artery bypass graft x 3   . Urine incontinence    Dr. Karsten Ro    Current Outpatient Medications  Medication Sig Dispense Refill  . allopurinol (ZYLOPRIM) 100 MG tablet TAKE ONE TABLET BY MOUTH ON MONDAY, WEDNESDAY, AND FRIDAY 36 tablet 3  . ALPRAZolam (NIRAVAM) 0.25 MG dissolvable tablet Take 0.25 mg by mouth at bedtime as needed for anxiety.    Marland Kitchen aspirin 325 MG tablet Take 325 mg by mouth daily.      . betamethasone dipropionate (DIPROLENE) 0.05 % cream Apply 1 application topically daily.     . ferrous sulfate 325 (65 FE) MG EC tablet Take 325 mg by mouth daily with breakfast.    . finasteride (PROSCAR) 5 MG tablet TAKE ONE TABLET BY  MOUTH EVERY DAY 90 tablet 0  . Lancets (ONETOUCH ULTRASOFT) lancets Use to check blood sugar once daily 100 each 12  . lisinopril (ZESTRIL) 5 MG tablet TAKE ONE TABLET BY MOUTH DAILY 90 tablet 1  . metFORMIN (GLUCOPHAGE-XR) 500 MG 24 hr tablet TAKE ONE TABLET BY MOUTH TWICE DAILY WITH MEALS (Patient taking differently: Take 500 mg by mouth 2 (two) times daily with a meal. ) 180 tablet 1  . MULTIPLE VITAMIN PO Take 1 tablet by mouth daily.     Marland Kitchen omeprazole (PRILOSEC) 40 MG capsule Take 1 capsule (40 mg total) by mouth daily. 30 capsule 3  . PREVIDENT 5000 PLUS 1.1 % CREA dental cream Place 1 application onto teeth at bedtime.     . rosuvastatin (CRESTOR) 10 MG tablet TAKE ONE TABLET BY MOUTH EVERY DAY (Patient taking differently: Take 10 mg by mouth daily. ) 90 tablet 0  . tamsulosin (FLOMAX) 0.4 MG CAPS capsule TAKE ONE CAPSULE BY MOUTH DAILY 90 capsule 3  . Trospium Chloride 60 MG  CP24 TAKE ONE CAPSULE BY MOUTH DAILY 90 capsule 1  . vitamin B-12 (CYANOCOBALAMIN) 1000 MCG tablet Take 1,000 mcg by mouth daily.    . nitroGLYCERIN (NITROSTAT) 0.4 MG SL tablet Place 1 tablet (0.4 mg total) under the tongue every 5 (five) minutes as needed for chest pain. (Patient not taking: Reported on 05/21/2019) 25 tablet 3   No current facility-administered medications for this visit.     Past Surgical History:  Procedure Laterality Date  . BILIARY BRUSHING  06/26/2019   PATH: invasive adenocarcinoma.  Procedure: BILIARY BRUSHING;  Surgeon: Rush Landmark Telford Nab., MD;  Location: [facility];  Service: Gastroenterology;;  . BILIARY DILATION  06/26/2019   Procedure: BILIARY DILATION;  Surgeon: Irving Copas., MD;  Location: [place];  Service: Gastroenterology;;  . BILIARY STENT PLACEMENT  06/26/2019   Procedure: [place];  Surgeon: Irving Copas., MD;  Location: [facility];  Service: Gastroenterology;;  . CARDIOVASCULAR STRESS TEST  08/28/2016   Low risk myoview, normal EF, no  ischemia.  Marland Kitchen CATARACT EXTRACTION, BILATERAL    . COLONOSCOPY  2002, 2006, 02/25/2009; 06/11/18   Polyp x 1 2002, none 2006 or 2010.  Adenomatous polyp 06/11/18+  signif diverticulosis.  NO FURTHER COLONOSCOPIES DUE TO AGE.  Marland Kitchen CORONARY ARTERY BYPASS GRAFT  06/2009   descending,saphenous vein graft to first obtuse marginal, sequential saphenous vein graft to posterior descending and posterolateral  . Endoscopic vein harvest right thigh    . ERCP N/A 06/26/2019   Procedure: ENDOSCOPIC RETROGRADE CHOLANGIOPANCREATOGRAPHY (ERCP);  Surgeon: Irving Copas., MD;  Location: [facility];  Service: Gastroenterology;  Laterality: N/A;  with stent   . IR RADIOLOGIST EVAL & MGMT  06/12/2018  . IR RADIOLOGIST EVAL & MGMT  09/18/2018  . IR RADIOLOGIST EVAL & MGMT  05/26/2019  . IR RADIOLOGIST EVAL & MGMT  07/29/2019  . PTCA    . REMOVAL OF STONES  06/26/2019   Procedure: REMOVAL OF STONES;  Surgeon: Rush Landmark Telford Nab., MD;  Location: [facility];  Service: Gastroenterology;;  . Joan Mayans  06/26/2019   Procedure: Joan Mayans;  Surgeon: Irving Copas., MD;  Location: [facility];  Service: Gastroenterology;;  . TONSILLECTOMY    . UMBILICAL HERNIA REPAIR     2009  . VASECTOMY      Family History  Problem Relation Age of Onset  . Cancer Mother   . Cancer Father        pt points to LLQ as  area of cancer, so potentially could have been intestinal.     . Colon cancer Neg Hx   . Esophageal cancer Neg Hx   . Stomach cancer Neg Hx   . Rectal cancer Neg Hx     Social History   Tobacco Use  . Smoking status: Former Smoker    Packs/day: 1.00    Years: 30.00    Pack years: 30.00    Types: Cigarettes    Quit date: 03/15/1983    Years since quitting: 36.5  . Smokeless tobacco: Never Used  . Tobacco comment: Quit 1984  Substance Use Topics  . Alcohol use: Not Currently    Alcohol/week: 5.0 - 7.0 standard drinks    Types: 5 - 7 Glasses of wine per week    Comment: quit etoh 1999  but in ~ 2017 began having a glass of wine with dinner   . Drug use: No    Allergies  Allergen Reactions  . Sulfonamide Derivatives Nausea Only     Review  of Systems: All systems reviewed and negative except where noted in HPI.    Physical Exam:    Today's Vitals   10/02/19 1547  BP: 108/60  Pulse: 60  Temp: 97.7 F (36.5 C)  Weight: 152 lb 6 oz (69.1 kg)  Height: 5' 5"  (1.651 m)   Body mass index is 25.36 kg/m.   BP 108/60   Pulse 60   Temp 97.7 F (36.5 C)   Ht 5' 5"  (1.651 m)   Wt 152 lb 6 oz (69.1 kg)   BMI 25.36 kg/m  @WEIGHTLAST3 @ GENERAL:  Alert, oriented, cooperative, not in acute distress. PSYCH: :Pleasant, normal mood and affect. HEENT:  conjunctiva pink, mucous membranes moist, neck supple without masses. No jaundice. CARDIAC:  S1 S2 normal. No murmers. PULM: Normal respiratory effort, lungs CTA bilaterally, no wheezing. ABDOMEN: Inspection: No visible peristalsis, no abnormal pulsations, skin normal.  Palpation/percussion: Soft, nontender, nondistended, no rigidity, no abnormal dullness to percussion, no hepatosplenomegaly and no palpable abdominal masses.  Auscultation: Normal bowel sounds, no abdominal bruits. Rectal exam: Deferred SKIN:  turgor, no lesions seen. Musculoskeletal:  Normal muscle tone, normal strength. NEURO: Alert and oriented x 3, no focal neurologic deficits.   Data Reviewed: I have personally reviewed following labs and imaging studies  Hepatic Function Latest Ref Rng & Units 08/28/2019 08/03/2019 07/17/2019  Total Protein 6.5 - 8.1 g/dL 6.5 6.5 6.3(L)  Albumin 3.5 - 5.0 g/dL 4.0 3.8 3.7  AST 15 - 41 U/L 13(L) 14 13(L)  ALT 0 - 44 U/L 10 10 10   Alk Phosphatase 38 - 126 U/L 81 84 101  Total Bilirubin 0.3 - 1.2 mg/dL 0.5 0.5 0.5  Bilirubin, Direct 0.0 - 0.2 mg/dL - - -  15 minutes spent with the patient today. Greater than 50% was spent in counseling and coordination of care with the patient      Akhilesh Sassone,MD 10/02/2019,  3:57 PM   CC McGowen, Adrian Blackwater, MD

## 2019-10-03 LAB — T3: T3, Total: 69 ng/dL — ABNORMAL LOW (ref 76–181)

## 2019-10-05 ENCOUNTER — Telehealth: Payer: Self-pay

## 2019-10-05 ENCOUNTER — Encounter: Payer: Self-pay | Admitting: Family Medicine

## 2019-10-05 ENCOUNTER — Other Ambulatory Visit: Payer: Self-pay

## 2019-10-05 DIAGNOSIS — K8309 Other cholangitis: Secondary | ICD-10-CM

## 2019-10-05 DIAGNOSIS — C221 Intrahepatic bile duct carcinoma: Secondary | ICD-10-CM

## 2019-10-05 NOTE — Telephone Encounter (Signed)
Call placed to the pt that we can set up ERCP stent exchange with Dr Rush Landmark on 12/30 at 9 am at [facility].

## 2019-10-05 NOTE — Telephone Encounter (Signed)
Mansouraty, Telford Nab., MD  Jackquline Denmark, MD; Timothy Lasso, RN        Lebert Lovern,  If we have availability to get the procedure done in December, then please let us know.  Otherwise, he can go on the wait list and get added once we have our January schedule out.  Thanks.  GM

## 2019-10-05 NOTE — Telephone Encounter (Signed)
I spoke with the pt wife due to the pt dementia.  An appt has been made for ERCP on 12/30 at 9 am at [facility].  Instructions mailed to the pt home.  COVID instructions also advised.

## 2019-10-06 ENCOUNTER — Other Ambulatory Visit: Payer: Self-pay

## 2019-10-06 ENCOUNTER — Other Ambulatory Visit (INDEPENDENT_AMBULATORY_CARE_PROVIDER_SITE_OTHER): Payer: Medicare Other

## 2019-10-06 DIAGNOSIS — R160 Hepatomegaly, not elsewhere classified: Secondary | ICD-10-CM

## 2019-10-06 DIAGNOSIS — K8309 Other cholangitis: Secondary | ICD-10-CM | POA: Diagnosis not present

## 2019-10-06 DIAGNOSIS — E039 Hypothyroidism, unspecified: Secondary | ICD-10-CM

## 2019-10-06 LAB — COMPREHENSIVE METABOLIC PANEL
ALT: 14 U/L (ref 0–53)
AST: 17 U/L (ref 0–37)
Albumin: 3.7 g/dL (ref 3.5–5.2)
Alkaline Phosphatase: 93 U/L (ref 39–117)
BUN: 17 mg/dL (ref 6–23)
CO2: 26 mEq/L (ref 19–32)
Calcium: 8.8 mg/dL (ref 8.4–10.5)
Chloride: 106 mEq/L (ref 96–112)
Creatinine, Ser: 0.96 mg/dL (ref 0.40–1.50)
GFR: 74.84 mL/min (ref >60.00)
Glucose, Bld: 120 mg/dL — ABNORMAL HIGH (ref 70–99)
Potassium: 4.2 mEq/L (ref 3.5–5.1)
Sodium: 141 mEq/L (ref 135–145)
Total Bilirubin: 0.4 mg/dL (ref 0.2–1.2)
Total Protein: 6.3 g/dL (ref 6.0–8.3)

## 2019-10-06 MED ORDER — LEVOTHYROXINE SODIUM 25 MCG PO TABS: 25.0000 ug | ORAL_TABLET | Freq: Every day | ORAL | 1 refills | Status: DC

## 2019-10-07 LAB — CANCER ANTIGEN 19-9: CA 19-9: 8 U/mL (ref <34)

## 2019-10-08 IMAGING — CT CT ABDOMEN AND PELVIS WITHOUT AND WITH CONTRAST
2 of 10 series · 14 of 46 positions shown, 16 images · IV contrast (omnipaque)
Comparison: MRI 06/24/2019.  PET-CT 05/18/2019.

CLINICAL DATA: Intrahepatic cholangiocarcinoma of the right hepatic
lobe with ductal dilatation previously identified in segment VIII.

EXAM:
CT ABDOMEN AND PELVIS WITHOUT AND WITH CONTRAST
TECHNIQUE: Multidetector CT imaging of the abdomen and pelvis was performed
following the standard protocol before and following the bolus
administration of intravenous contrast.
CONTRAST:  100mL OMNIPAQUE IOHEXOL 350 MG/ML SOLN

[Series 5: coronal arterial · coronal · arterial · 0.57mm/px · 3 of 95 slices shown]
[im 24/95  soft-tissue]
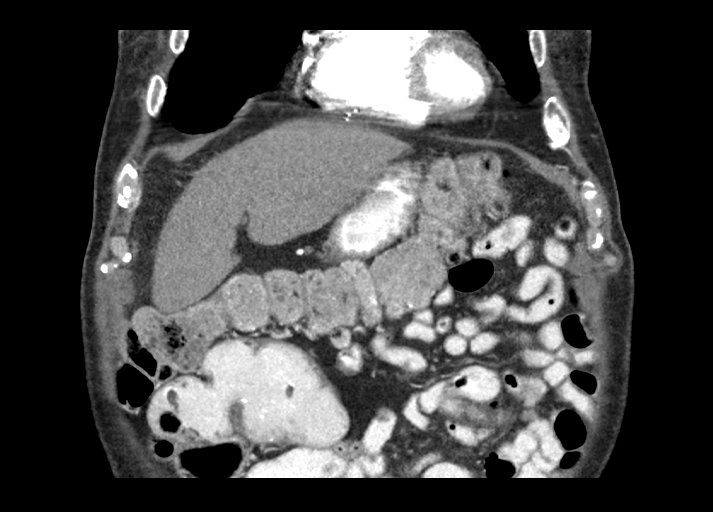
[im 48/95  soft-tissue]
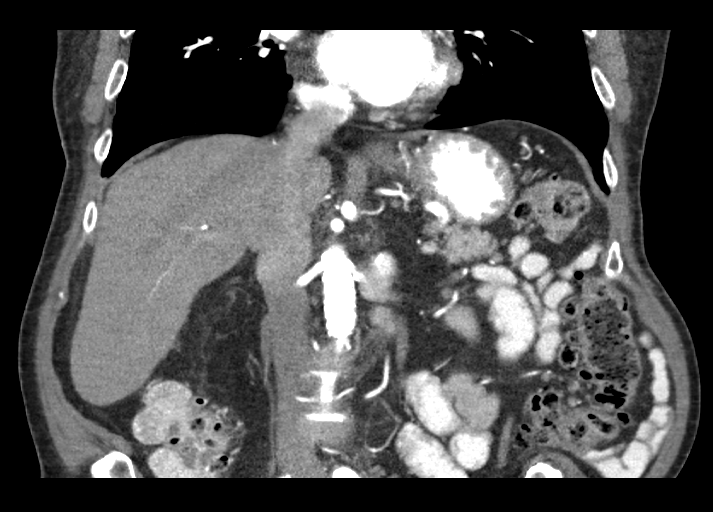
[im 71/95  soft-tissue]
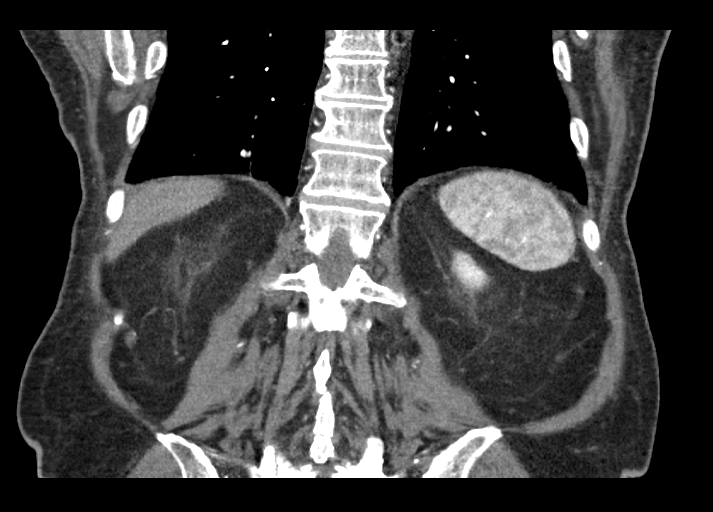

[Series 8: axial venous · axial · portal-venous · 0.84mm/px · z∈[-494,-8]mm · 11 of 190 slices shown, 13 images]
[im 14/190  soft-tissue]
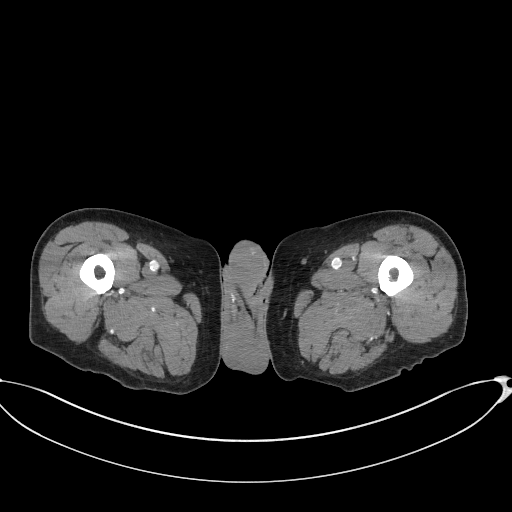
[im 14/190  bone]
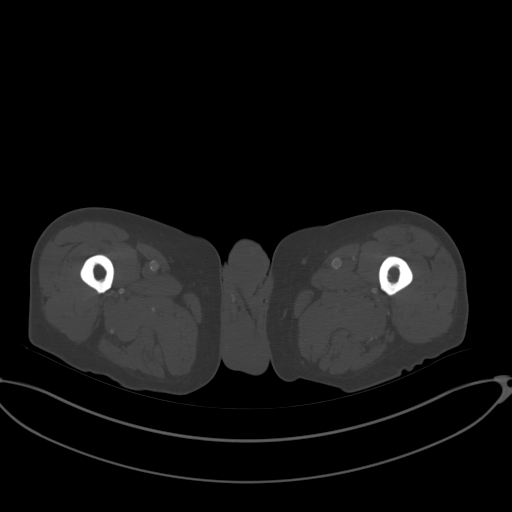
[im 28/190  soft-tissue]
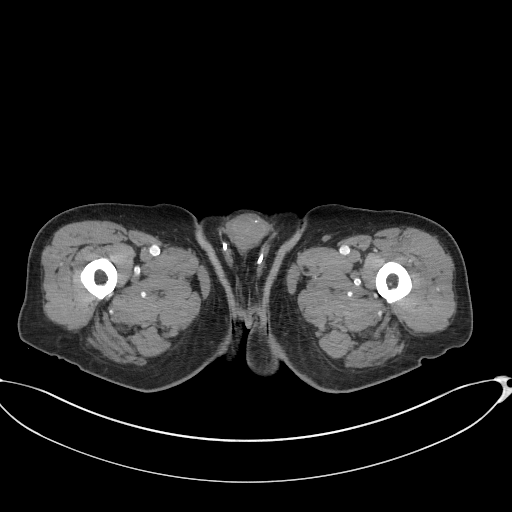
[im 41/190  soft-tissue]
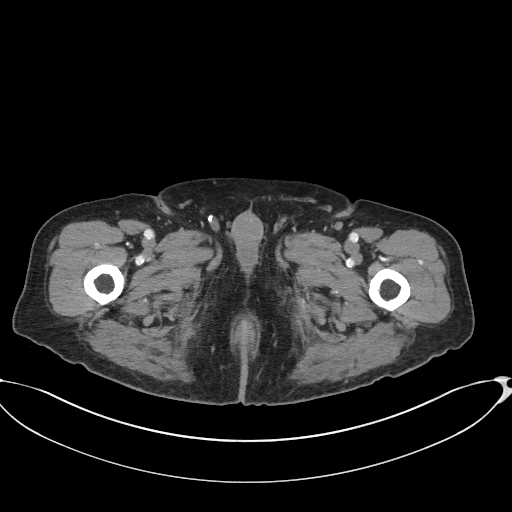
[im 68/190  soft-tissue]
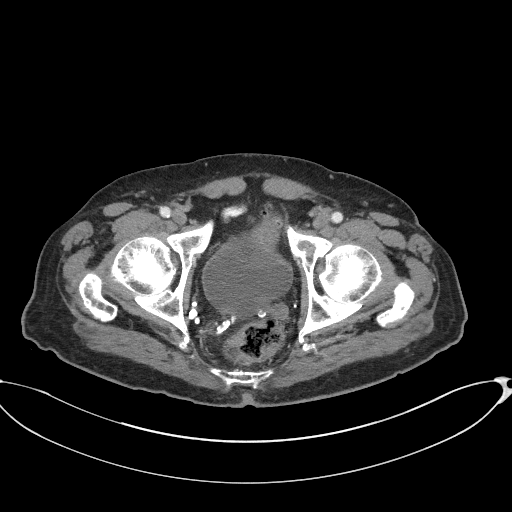
[im 82/190  soft-tissue]
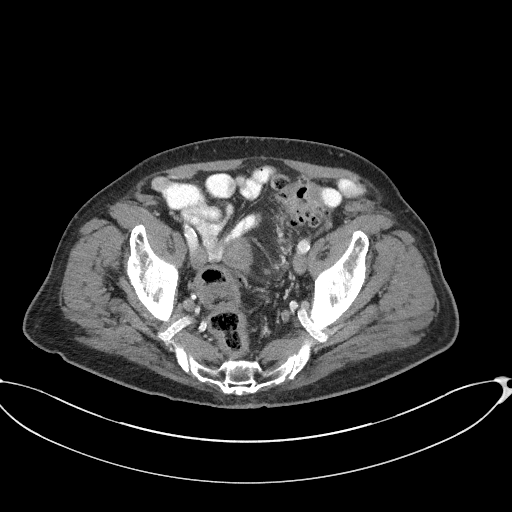
[im 95/190  soft-tissue]
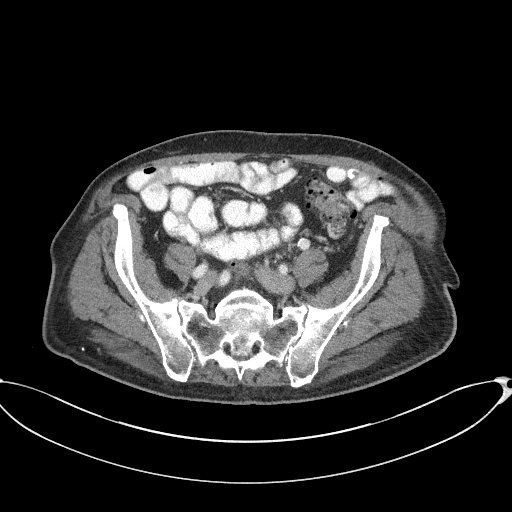
[im 109/190  soft-tissue]
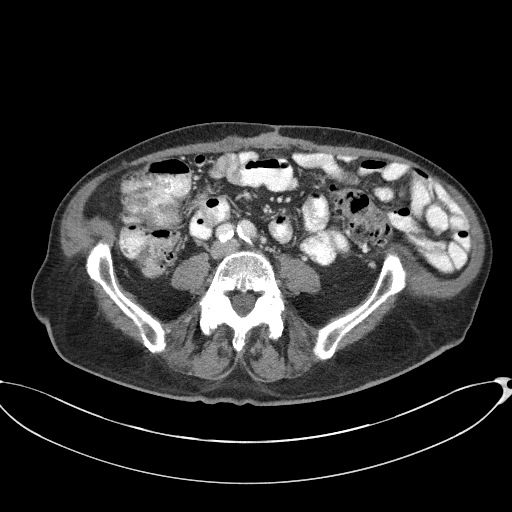
[im 122/190  soft-tissue]
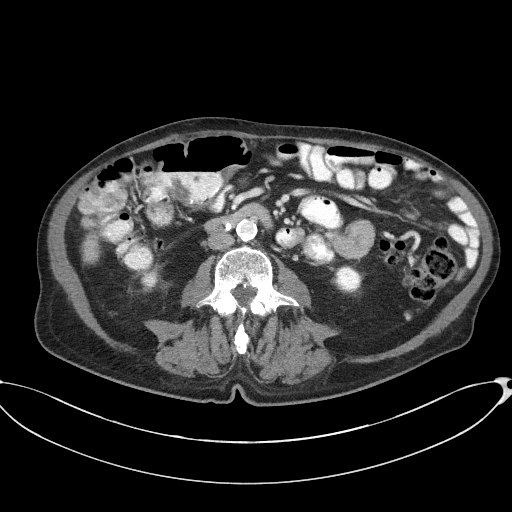
[im 149/190  soft-tissue]
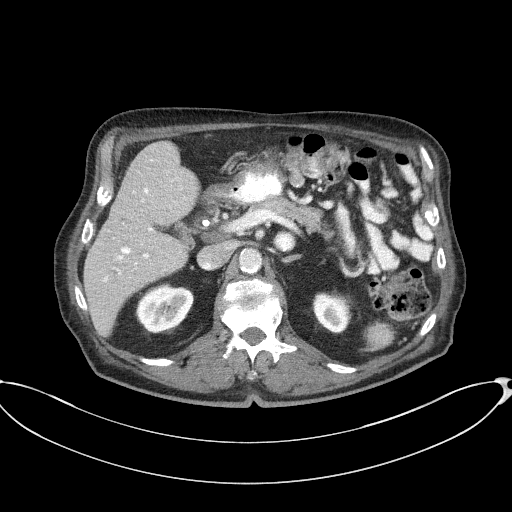
[im 149/190  bone]
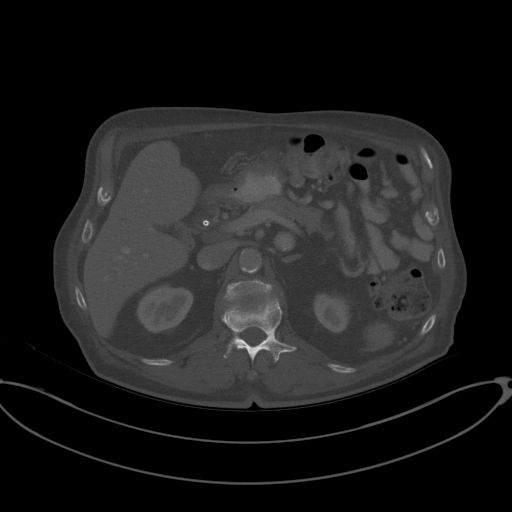
[im 163/190  soft-tissue]
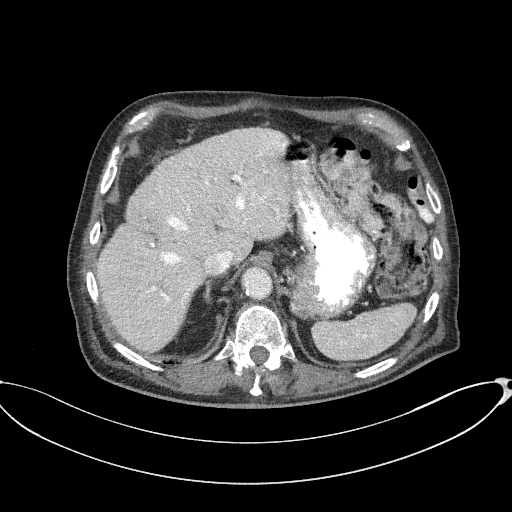
[im 176/190  soft-tissue]
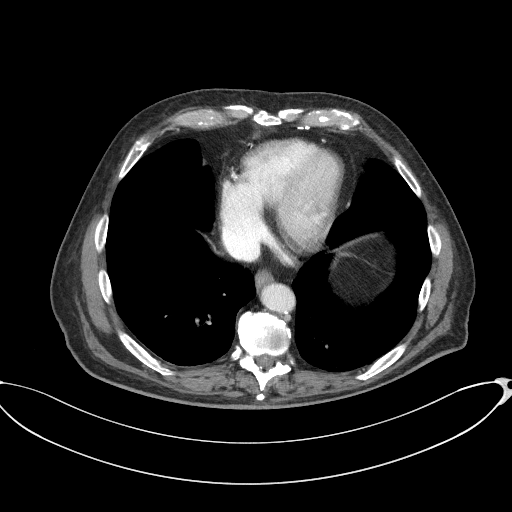

[14 of 46 positions shown; findings below may reference images not displayed]

FINDINGS: Lower chest: Unremarkable.

Hepatobiliary: Multiphase imaging of the liver shows no obvious
lesion in the central right liver to explain the mild intrahepatic
biliary duct dilatation seen mainly in segments VII and VIII on the
current study. There is some subtle arterial phase enhancement in
the central right liver seen on arterial phase imaging (axial
35/series 4) which becomes hypoattenuating compared to liver
parenchyma on more delayed imaging (33/8). This region corresponds
to the hypermetabolic disease seen on the previous PET-CT and is
compatible with the patient's known neoplasm.

Common bile duct stent is visualized in situ and trace pneumobilia
in the left hepatic lobe is compatible with the presence of the
stent device. Mild gallbladder wall thickening versus
pericholecystic fluid evident.

Pancreas: No focal mass lesion. No dilatation of the main duct. No
intraparenchymal cyst. No peripancreatic edema.

Spleen: No splenomegaly. No focal mass lesion.

Adrenals/Urinary Tract: No adrenal nodule or mass. Kidneys
unremarkable.

Stomach/Bowel: Stomach is unremarkable. No gastric wall thickening.
No evidence of outlet obstruction. Duodenum is normally positioned
as is the ligament of Treitz. No small bowel wall thickening. No
small bowel dilatation. The terminal ileum is normal. The appendix
is not visualized, but there is no edema or inflammation in the
region of the cecum. No gross colonic mass. No colonic wall
thickening. Diverticular changes are noted in the left colon without
evidence of diverticulitis.

Vascular/Lymphatic: Normal hepatic arterial branch anatomy. Celiac
axis and SMA are patent. IMA opacifies normally. Portal vein,
superior mesenteric vein, and splenic vein are patent. Hepatic veins
opacify normally. There is no gastrohepatic or hepatoduodenal
ligament lymphadenopathy. No intraperitoneal or retroperitoneal
lymphadenopathy. No pelvic sidewall lymphadenopathy.

Reproductive: The prostate gland and seminal vesicles are
unremarkable.

Other: No intraperitoneal free fluid.

Musculoskeletal: No worrisome lytic or sclerotic osseous
abnormality.
IMPRESSION: 1. Ill-defined subtle area of differential perfusion identified in
the central right liver, corresponding to the location of the
abnormal hypermetabolism seen on the previous PET-CT. Imaging
features compatible with the patient's known neoplasm and there is
associated mild intrahepatic biliary duct dilatation mainly in
segments VII and VIII.
2. Minimal gallbladder wall thickening versus trace pericholecystic
fluid.
3. No evidence for lymphadenopathy in the abdomen or pelvis.
4. Left colonic diverticulosis without diverticulitis.
5.  Aortic Atherosclerois ([id number])

## 2019-10-08 NOTE — Progress Notes (Signed)
  Radiation Oncology         [phone] ________________________________  Name: Ricardo Washington MRN: [id number]  Date: 08/28/2019  DOB: Feb 19, 1935  End of Treatment Note  Diagnosis:   Intrahepatic cholangiocarcinoma    Indication for treatment::  curative       Radiation treatment dates:   08/18/19 - 08/28/19  Site/dose:   The patient was treated to the liver with a course of stereotactic body radiation treatment.  The patient received 50 Gray in 5 fractions using a IMRT technique, with 3 fields.  Narrative: The patient tolerated radiation treatment relatively well.   No unexpected difficulties.  The patient's breathing did not significantly change during the course of the treatment.  Plan: The patient has completed radiation treatment. The patient will return to radiation oncology clinic for routine followup in one month. I advised the patient to call or return sooner if they have any questions or concerns related to their recovery or treatment. ________________________________  Jodelle Gross, M.D., Ph.D.

## 2019-10-19 ENCOUNTER — Other Ambulatory Visit: Payer: Self-pay | Admitting: Family Medicine

## 2019-10-23 ENCOUNTER — Telehealth: Payer: Self-pay | Admitting: *Deleted

## 2019-10-23 NOTE — Telephone Encounter (Signed)
On 10-26-19 mail medical records to patient so he can turn into [employer] for a claim.

## 2019-10-29 ENCOUNTER — Other Ambulatory Visit: Payer: Self-pay | Admitting: Family Medicine

## 2019-11-10 ENCOUNTER — Other Ambulatory Visit: Payer: Self-pay

## 2019-11-14 ENCOUNTER — Other Ambulatory Visit (HOSPITAL_COMMUNITY)
Admission: RE | Admit: 2019-11-14 | Discharge: 2019-11-14 | Disposition: A | Discharge status: Home / Self Care | Payer: Medicare Other | Source: Ambulatory Visit | Attending: Gastroenterology | Admitting: Gastroenterology

## 2019-11-14 DIAGNOSIS — Z20828 Contact with and (suspected) exposure to other viral communicable diseases: Secondary | ICD-10-CM | POA: Diagnosis not present

## 2019-11-14 DIAGNOSIS — Z01812 Encounter for preprocedural laboratory examination: Secondary | ICD-10-CM | POA: Diagnosis present

## 2019-11-15 LAB — NOVEL CORONAVIRUS, NAA (HOSP ORDER, SEND-OUT TO REF LAB; TAT 18-24 HRS): SARS-CoV-2, NAA: NOT DETECTED

## 2019-11-16 ENCOUNTER — Ambulatory Visit: Payer: Medicare Other

## 2019-11-18 ENCOUNTER — Other Ambulatory Visit: Payer: Self-pay

## 2019-11-18 ENCOUNTER — Ambulatory Visit (HOSPITAL_COMMUNITY): Payer: Medicare Other | Admitting: Anesthesiology

## 2019-11-18 ENCOUNTER — Ambulatory Visit (HOSPITAL_COMMUNITY)
Admission: RE | Admit: 2019-11-18 | Discharge: 2019-11-18 | Disposition: A | Discharge status: Home / Self Care | Payer: Medicare Other | Attending: Gastroenterology | Admitting: Gastroenterology

## 2019-11-18 ENCOUNTER — Encounter (HOSPITAL_COMMUNITY)
Admission: RE | Disposition: A | Discharge status: Home / Self Care | Payer: Self-pay | Source: Home / Self Care | Attending: Gastroenterology

## 2019-11-18 ENCOUNTER — Ambulatory Visit (HOSPITAL_COMMUNITY): Payer: Medicare Other

## 2019-11-18 ENCOUNTER — Encounter (HOSPITAL_COMMUNITY): Payer: Self-pay | Admitting: Gastroenterology

## 2019-11-18 DIAGNOSIS — K5730 Diverticulosis of large intestine without perforation or abscess without bleeding: Secondary | ICD-10-CM | POA: Insufficient documentation

## 2019-11-18 DIAGNOSIS — Z87891 Personal history of nicotine dependence: Secondary | ICD-10-CM | POA: Diagnosis not present

## 2019-11-18 DIAGNOSIS — N529 Male erectile dysfunction, unspecified: Secondary | ICD-10-CM | POA: Diagnosis not present

## 2019-11-18 DIAGNOSIS — E039 Hypothyroidism, unspecified: Secondary | ICD-10-CM | POA: Insufficient documentation

## 2019-11-18 DIAGNOSIS — Z86010 Personal history of colonic polyps: Secondary | ICD-10-CM | POA: Diagnosis not present

## 2019-11-18 DIAGNOSIS — C221 Intrahepatic bile duct carcinoma: Secondary | ICD-10-CM | POA: Diagnosis not present

## 2019-11-18 DIAGNOSIS — F0390 Unspecified dementia without behavioral disturbance: Secondary | ICD-10-CM | POA: Insufficient documentation

## 2019-11-18 DIAGNOSIS — E119 Type 2 diabetes mellitus without complications: Secondary | ICD-10-CM | POA: Diagnosis not present

## 2019-11-18 DIAGNOSIS — Z4659 Encounter for fitting and adjustment of other gastrointestinal appliance and device: Secondary | ICD-10-CM | POA: Diagnosis not present

## 2019-11-18 DIAGNOSIS — K831 Obstruction of bile duct: Secondary | ICD-10-CM | POA: Diagnosis not present

## 2019-11-18 DIAGNOSIS — M109 Gout, unspecified: Secondary | ICD-10-CM | POA: Diagnosis not present

## 2019-11-18 DIAGNOSIS — I2510 Atherosclerotic heart disease of native coronary artery without angina pectoris: Secondary | ICD-10-CM | POA: Diagnosis not present

## 2019-11-18 DIAGNOSIS — Z8509 Personal history of malignant neoplasm of other digestive organs: Secondary | ICD-10-CM | POA: Diagnosis not present

## 2019-11-18 DIAGNOSIS — E785 Hyperlipidemia, unspecified: Secondary | ICD-10-CM | POA: Diagnosis not present

## 2019-11-18 DIAGNOSIS — K7581 Nonalcoholic steatohepatitis (NASH): Secondary | ICD-10-CM | POA: Insufficient documentation

## 2019-11-18 DIAGNOSIS — I10 Essential (primary) hypertension: Secondary | ICD-10-CM | POA: Diagnosis not present

## 2019-11-18 DIAGNOSIS — Z66 Do not resuscitate: Secondary | ICD-10-CM | POA: Insufficient documentation

## 2019-11-18 DIAGNOSIS — Z951 Presence of aortocoronary bypass graft: Secondary | ICD-10-CM | POA: Diagnosis not present

## 2019-11-18 HISTORY — PX: BILIARY DILATION: SHX6850

## 2019-11-18 HISTORY — PX: STENT REMOVAL: SHX6421

## 2019-11-18 HISTORY — PX: BILIARY STENT PLACEMENT: SHX5538

## 2019-11-18 HISTORY — PX: ERCP: SHX5425

## 2019-11-18 HISTORY — PX: REMOVAL OF STONES: SHX5545

## 2019-11-18 LAB — GLUCOSE, CAPILLARY: Glucose-Capillary: 102 mg/dL — ABNORMAL HIGH (ref 70–99)

## 2019-11-18 SURGERY — ERCP, WITH INTERVENTION IF INDICATED
Anesthesia: General

## 2019-11-18 MED ORDER — PROPOFOL 10 MG/ML IV BOLUS
INTRAVENOUS | Status: DC | PRN
  Administered 2019-11-18: 110 mg via INTRAVENOUS

## 2019-11-18 MED ORDER — INDOMETHACIN 50 MG RE SUPP
RECTAL | Status: DC | PRN
  Administered 2019-11-18: 100 mg via RECTAL

## 2019-11-18 MED ORDER — LIDOCAINE 2% (20 MG/ML) 5 ML SYRINGE
INTRAMUSCULAR | Status: DC | PRN
  Administered 2019-11-18: 60 mg via INTRAVENOUS

## 2019-11-18 MED ORDER — DEXAMETHASONE SODIUM PHOSPHATE 10 MG/ML IJ SOLN
INTRAMUSCULAR | Status: DC | PRN
  Administered 2019-11-18: 10 mg via INTRAVENOUS

## 2019-11-18 MED ORDER — ROCURONIUM BROMIDE 50 MG/5ML IV SOSY
PREFILLED_SYRINGE | INTRAVENOUS | Status: DC | PRN
  Administered 2019-11-18: 50 mg via INTRAVENOUS

## 2019-11-18 MED ORDER — CIPROFLOXACIN HCL 500 MG PO TABS: 500.0000 mg | ORAL_TABLET | Freq: Two times a day (BID) | ORAL | 0 refills | Status: AC

## 2019-11-18 MED ORDER — SUGAMMADEX SODIUM 200 MG/2ML IV SOLN
INTRAVENOUS | Status: DC | PRN
  Administered 2019-11-18: 200 mg via INTRAVENOUS

## 2019-11-18 MED ORDER — FENTANYL CITRATE (PF) 100 MCG/2ML IJ SOLN
INTRAMUSCULAR | Status: DC | PRN
  Administered 2019-11-18: 100 ug via INTRAVENOUS

## 2019-11-18 MED ORDER — GLUCAGON HCL RDNA (DIAGNOSTIC) 1 MG IJ SOLR
INTRAMUSCULAR | Status: AC
  Filled 2019-11-18: qty 2

## 2019-11-18 MED ORDER — SODIUM CHLORIDE 0.9 % IV SOLN: INTRAVENOUS | Status: DC

## 2019-11-18 MED ORDER — PHENYLEPHRINE 40 MCG/ML (10ML) SYRINGE FOR IV PUSH (FOR BLOOD PRESSURE SUPPORT)
PREFILLED_SYRINGE | INTRAVENOUS | Status: DC | PRN
  Administered 2019-11-18: 120 ug via INTRAVENOUS

## 2019-11-18 MED ORDER — PROPOFOL 10 MG/ML IV BOLUS
INTRAVENOUS | Status: AC
  Filled 2019-11-18: qty 20

## 2019-11-18 MED ORDER — CIPROFLOXACIN IN D5W 400 MG/200ML IV SOLN
400.0000 mg | Freq: Once | INTRAVENOUS | Status: AC
  Administered 2019-11-18: 400 mg via INTRAVENOUS

## 2019-11-18 MED ORDER — GLUCAGON HCL RDNA (DIAGNOSTIC) 1 MG IJ SOLR
INTRAMUSCULAR | Status: DC | PRN
  Administered 2019-11-18 (×3): .25 mg via INTRAVENOUS

## 2019-11-18 MED ORDER — CIPROFLOXACIN IN D5W 400 MG/200ML IV SOLN
INTRAVENOUS | Status: AC
  Filled 2019-11-18: qty 200

## 2019-11-18 MED ORDER — INDOMETHACIN 50 MG RE SUPP
RECTAL | Status: AC
  Filled 2019-11-18: qty 2

## 2019-11-18 MED ORDER — FENTANYL CITRATE (PF) 100 MCG/2ML IJ SOLN
INTRAMUSCULAR | Status: AC
  Filled 2019-11-18: qty 2

## 2019-11-18 MED ORDER — LACTATED RINGERS IV SOLN
INTRAVENOUS | Status: AC | PRN
  Administered 2019-11-18: 1000 mL via INTRAVENOUS

## 2019-11-18 MED ORDER — ONDANSETRON HCL 4 MG/2ML IJ SOLN
INTRAMUSCULAR | Status: DC | PRN
  Administered 2019-11-18: 4 mg via INTRAVENOUS

## 2019-11-18 MED ORDER — LACTATED RINGERS IV SOLN: INTRAVENOUS | Status: DC

## 2019-11-18 MED ORDER — PHENYLEPHRINE HCL-NACL 10-0.9 MG/250ML-% IV SOLN
INTRAVENOUS | Status: DC | PRN
  Administered 2019-11-18: 50 ug/min via INTRAVENOUS

## 2019-11-18 MED ORDER — SODIUM CHLORIDE 0.9 % IV SOLN
INTRAVENOUS | Status: DC | PRN
  Administered 2019-11-18: 105 mL

## 2019-11-18 NOTE — Anesthesia Procedure Notes (Signed)
Procedure Name: Intubation Date/Time: 11/18/2019 9:33 AM Performed by: Sharlette Dense, CRNA Patient Re-evaluated:Patient Re-evaluated prior to induction Oxygen Delivery Method: Circle system utilized Preoxygenation: Pre-oxygenation with 100% oxygen Induction Type: IV induction Ventilation: Mask ventilation without difficulty and Oral airway inserted - appropriate to patient size Laryngoscope Size: Miller and 3 Grade View: Grade I Tube type: Oral Tube size: 7.5 mm Number of attempts: 1 Airway Equipment and Method: Stylet Placement Confirmation: ETT inserted through vocal cords under direct vision,  positive ETCO2 and breath sounds checked- equal and bilateral Secured at: 21 cm Tube secured with: Tape Dental Injury: Teeth and Oropharynx as per pre-operative assessment

## 2019-11-18 NOTE — Transfer of Care (Signed)
Immediate Anesthesia Transfer of Care Note  Patient: Ricardo Washington  Procedure(s) Performed: ENDOSCOPIC RETROGRADE CHOLANGIOPANCREATOGRAPHY (ERCP) (N/A ) STENT REMOVAL BILIARY STENT PLACEMENT (N/A ) BILIARY DILATION REMOVAL OF STONES  Patient Location: Endoscopy Unit  Anesthesia Type:General  Level of Consciousness: drowsy  Airway & Oxygen Therapy: Patient Spontanous Breathing and Patient connected to face mask oxygen  Post-op Assessment: Report given to RN and Post -op Vital signs reviewed and stable  Post vital signs: Reviewed and stable  Last Vitals:  Vitals Value Taken Time  BP    Temp    Pulse    Resp    SpO2      Last Pain:  Vitals:   11/18/19 0802  TempSrc: Oral  PainSc: 0-No pain         Complications: No apparent anesthesia complications

## 2019-11-18 NOTE — Op Note (Signed)
[facility] Patient Name: Ricardo Washington Procedure Date: 11/18/2019 MRN: [id number] Attending MD: Justice Britain , MD Date of Birth: 08-23-1935 CSN: [id number] Age: 84 Admit Type: Inpatient Procedure:                ERCP Indications:              Cholangiocarcinoma, Stent change Providers:                Justice Britain, MD, Carlyn Reichert, RN, Lazaro Arms, Technician Referring MD:             [name], Jodelle Gross, Dr. Purcell Nails, MD Medicines:                General Anesthesia, Cipro 400 mg IV, Indomethacin                            169 mg PR Complications:            No immediate complications. Estimated Blood Loss:     Estimated blood loss was minimal. Procedure:                Pre-Anesthesia Assessment:                           - Prior to the procedure, a History and Physical                            was performed, and patient medications and                            allergies were reviewed. The patient's tolerance of                            previous anesthesia was also reviewed. The risks                            and benefits of the procedure and the sedation                            options and risks were discussed with the patient.                            All questions were answered, and informed consent                            was obtained. Prior Anticoagulants: The patient has                            taken no previous anticoagulant or antiplatelet  agents. ASA Grade Assessment: II - A patient with                            mild systemic disease. After reviewing the risks                            and benefits, the patient was deemed in                            satisfactory condition to undergo the procedure.                           After obtaining informed consent, the scope was                            passed under direct  vision. Throughout the                            procedure, the patient's blood pressure, pulse, and                            oxygen saturations were monitored continuously. The                            TJF-Q180V ([id number]) Olympus duodenoscope was                            introduced through the mouth, and used to inject                            contrast into and used to inject contrast into the                            bile duct. The ERCP was accomplished without                            difficulty after remaining in the semi-long                            position. The patient tolerated the procedure. Scope In: Scope Out: Findings:      A biliary stent was visible on the scout film.      The esophagus was successfully intubated under direct vision without       detailed examination of the pharynx, larynx, and associated structures,       and upper GI tract. The upper GI tract is significantly J-shaped. A       biliary sphincterotomy had been performed. The sphincterotomy appeared       open. One plastic biliary stent originating in the biliary tree was       emerging from the major papilla. The stent was visibly patent. The stent       had migrated downwards into the duodenum about 3 cm. One stent was       removed from the biliary tree using a snare. To maintain adequate  stability, the patient's procedure was done in the semi-long position.      A short 0.035 inch Soft Jagwire was passed into the left biliary tree.       The Autotome sphincterotome was passed over the guidewire and the bile       duct was then deeply cannulated. Contrast was injected. I personally       interpreted the bile duct images. Ductal flow of contrast was adequate.       Image quality was adequate. Contrast extended to the hepatic ducts.       Opacification of the entire biliary tree except for the cystic duct and       gallbladder was successful. The maximum diameter of the CBD was 8 mm.        The left and right hepatic ducts with secondary or tertiary branches of       the intrahepatic ducts (Bismuth IV) contained two severe stenoses 15 mm       in length on left and 20 mm in length on the right. The left main       hepatic duct and right main hepatic duct were moderately dilated from       aforementioned stenoses. The largest diameter was 8 mm on the right and       6 mm on the left. A short Revolution angled 0.025 inch Antonietta Breach was       passed into the biliary tree and maneuvered into the right duct. The       hepatic duct bifurcation, the left main hepatic duct and the right main       hepatic duct were successfully dilated with a Hurricane 6 mm balloon       dilator at areas of stricturing. Dilation of the common bile duct with       an 06-27-09 mm balloon (to a maximum balloon size of 10 mm) dilator was       successful as a sphincteroplasty for 4-minutes total. To discover       objects, the biliary tree was swept with a retrieval balloon starting at       the left main hepatic duct and right main hepatic duct. Sludge was swept       from the duct. After the dilation a bit of clot from recent dilation was       also noted to be removed. An occlusion cholangiogram was performed that       showed only the biliary pathology noted above, but drainage was slightly       improved. One 7 Fr by 12 cm transpapillary plastic biliary stent with a       single external flap and a single internal flap was placed into the       right hepatic duct. Bile flowed through the stent. The stent was in good       position. One 8.5 Fr by 12 cm transpapillary plastic biliary stent with       a single external flap and a single internal flap was placed into the       left hepatic duct. Bile flowed through the stent. The stent was in good       position. All contrast had drained.      A pancreatogram was not performed.      The duodenoscope was withdrawn from the patient. Impression:                -  Prior biliary sphincterotomy appeared open. One                            visibly patent stent from the biliary tree was seen                            in the major papilla and it had migrated into the                            duodenum slightly. This was removed.                           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 The strictures                            were malignant appearing and encompassed the CHD                            bifurcation as well.                           - The left main hepatic duct and right main hepatic                            duct were moderately dilated from the strictures..                           - The hepatic duct bifurcation, the left main                            hepatic duct and the right main hepatic duct were                            successfully dilated.                           - Common bile duct was successfully dilated as a                            sphincteroplasty.                           - The biliary tree was swept and sludge was found                            and some clot was removed (after dilations had been                            performed).                           -  One plastic biliary stent was placed into the                            right hepatic duct.                           - One plastic biliary stent was placed into the                            left hepatic duct.                           - Drainage was excellent at completion of procedure. Moderate Sedation:      Not Applicable - Patient had care per Anesthesia. Recommendation:           - The patient will be observed post-procedure,                            until all discharge criteria are met.                           - Discharge patient to home.                           - Patient has a contact number available for                            emergencies. The  signs and symptoms of potential                            delayed complications were discussed with the                            patient. Return to normal activities tomorrow.                            Written discharge instructions were provided to the                            patient.                           - Low fat diet.                           - Observe patient's clinical course.                           - Ciprofloxacin 500 mg BID x 5-days to decrease                            risk of post-infectious complications.                           - Repeat ERCP in 4 months to exchange stents. At  the time, I think ordering 8 cm EPIC stents that                            are 8 mm and 6 mm in size will be helpful in effort                            of trying to place side-by-side UCSEMS in effort of                            a more permanent solution. Current stents that had                            been ordered were felt to be too short for adequate                            placement across both stricturing regions.                           - Watch for pancreatitis, bleeding, perforation,                            and cholangitis.                           - Follow up with Oncology/Radiation Oncology/GI/PCP                            as previously noted.                           - LFTs in 2-weeks at Dr. Steve Rattler office.                           - The findings and recommendations were discussed                            with the patient.                           - The findings and recommendations were discussed                            with the patient's family. Procedure Code(s):        --- Professional ---                           [phone], Endoscopic retrograde                            cholangiopancreatography (ERCP); with removal and                            exchange of stent(s), biliary or pancreatic duct,  including pre- and post-dilation and guide wire                            passage, when performed, including sphincterotomy,                            when performed, each stent exchanged                           43276, 59, Endoscopic retrograde                            cholangiopancreatography (ERCP); with removal and                            exchange of stent(s), biliary or pancreatic duct,                            including pre- and post-dilation and guide wire                            passage, when performed, including sphincterotomy,                            when performed, each stent exchanged                           43264, Endoscopic retrograde                            cholangiopancreatography (ERCP); with removal of                            calculi/debris from biliary/pancreatic duct(s) Diagnosis Code(s):        --- Professional ---                           C78.93, Presence of other specified functional                            implants                           K83.1, Obstruction of bile duct                           Z46.59, Encounter for fitting and adjustment of                            other gastrointestinal appliance and device                           C22.1, Intrahepatic bile duct carcinoma                           K83.8, Other specified diseases of biliary tract CPT copyright 2019 [employer]. All rights reserved. The codes  documented in this report are preliminary and upon coder review may  be revised to meet current compliance requirements. Justice Britain, MD 11/18/2019 11:40:20 AM Number of Addenda: 0

## 2019-11-18 NOTE — Anesthesia Preprocedure Evaluation (Signed)
Anesthesia Evaluation  Patient identified by MRN, date of birth, ID band Patient awake    Reviewed: Allergy & Precautions, NPO status , Patient's Chart, lab work & pertinent test results  Airway Mallampati: II  TM Distance: >3 FB     Dental   Pulmonary former smoker,    breath sounds clear to auscultation       Cardiovascular hypertension, + CAD   Rhythm:Regular Rate:Normal     Neuro/Psych PSYCHIATRIC DISORDERS Dementia    GI/Hepatic negative GI ROS, (+) Hepatitis -  Endo/Other  diabetesHypothyroidism   Renal/GU Renal disease     Musculoskeletal   Abdominal   Peds  Hematology   Anesthesia Other Findings   Reproductive/Obstetrics                             Anesthesia Physical Anesthesia Plan  ASA: III  Anesthesia Plan: General   Post-op Pain Management:    Induction: Intravenous  PONV Risk Score and Plan: Ondansetron  Airway Management Planned: Oral ETT  Additional Equipment:   Intra-op Plan:   Post-operative Plan: Possible Post-op intubation/ventilation  Informed Consent: I have reviewed the patients History and Physical, chart, labs and discussed the procedure including the risks, benefits and alternatives for the proposed anesthesia with the patient or authorized representative who has indicated his/her understanding and acceptance.     Dental advisory given  Plan Discussed with: CRNA, Anesthesiologist and Surgeon  Anesthesia Plan Comments:         Anesthesia Quick Evaluation

## 2019-11-18 NOTE — H&P (Signed)
GASTROENTEROLOGY PROCEDURE H&P NOTE   Primary Care Physician: [name], MD  HPI: Ricardo Washington is a 84 y.o. male who presents for ERCP.  Past Medical History:  Diagnosis Date  . BPH with obstruction/lower urinary tract symptoms 10/27/2015   Dr. Karsten Ro  . Cataract   . 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  Intrahepatic cholangiocarcinoma of R hepatic lobe; stage 1A, cT1aN0M0->not candidate for intervention or chemo, Dr. Mammie Lorenzo finished 08/2019.  Biliary stent temporary->to be replaced by metal stent 10/2019.  Marland Kitchen Colon wall thickening 04/2018   "mass-like" per radiologist interpretation; colonoscopy as next step---f/u colonoscopy showed 'tics but o/w normal.  NO further colonoscopies needed due to age.  . Coronary artery disease    with preserved LV function.  . Dementia ([place])   . Diabetes mellitus without complication ([place])   . Diverticulosis of colon    severe, entire colon  . Dysphagia 11/2018   Barium swallow: mild esophageal dysmotility.  . Ectatic abdominal aorta ([place]) 01/2017   Abd u/s: 2.9 cm aortic ectasia--at risk for aneurism development.  Recheck aortic u/s 5 yrs.  . Erectile dysfunction due to arterial insufficiency   . Fatigue   . Gout    always 2nd toe L foot (uric acid 6.20 Dec 2014 per old records)  . History of adenomatous polyp of colon 2002  . History of stomach ulcers   . Hyperlipidemia   . Hypertension   . Hypogonadism male   . Hypothyroidism 09/2019   started low dose synthroid 10/05/19  . Klebsiella sepsis ([place]) 01/2017   due to acute biliary tract infection (no stones) and acute diverticulitis.  . Macrocytic anemia 01/2017   vit B12 borderline low and iron borderline low: checking hemoccults and starting vit B12 PO and iron PO as of 02/11/17.  Vit B12 and folate normal as of GI f/u 06/2018.  . Myogenic ptosis of bilateral eyelids 2018   Plastic surgery in [place], [place] to do surg as of 03/2017.  Marland Kitchen NASH (nonalcoholic  steatohepatitis) 06/2017   LFTs up, abd u/s showed fatty liver but no other abnormality.  . Osteoarthritis of right shoulder 09/2018   Near end-stage --->glenohumeral joint-->intra-articular steroid injection 09/2018 (Dr. Delilah Shan).  11/2018-->end stage, but not ready for total shoulder replacement.  . Past use of tobacco    quit 1984  . Rectal bleeding   . Rosacea   . S/P coronary artery bypass graft x 3   . Urine incontinence    Dr. Karsten Ro   Past Surgical History:  Procedure Laterality Date  . BILIARY BRUSHING  06/26/2019   PATH: invasive adenocarcinoma.  Procedure: BILIARY BRUSHING;  Surgeon: Rush Landmark Telford Nab., MD;  Location: [facility];  Service: Gastroenterology;;  . BILIARY DILATION  06/26/2019   Procedure: BILIARY DILATION;  Surgeon: Irving Copas., MD;  Location: [facility];  Service: Gastroenterology;;  . BILIARY STENT PLACEMENT  06/26/2019   Procedure: [place];  Surgeon: Irving Copas., MD;  Location: [facility];  Service: Gastroenterology;;  . CARDIOVASCULAR STRESS TEST  08/28/2016   Low risk myoview, normal EF, no ischemia.  Marland Kitchen CATARACT EXTRACTION, BILATERAL    . COLONOSCOPY  2002, 2006, 02/25/2009; 06/11/18   Polyp x 1 2002, none 2006 or 2010.  Adenomatous polyp 06/11/18+  signif diverticulosis.  NO FURTHER COLONOSCOPIES DUE TO AGE.  Marland Kitchen CORONARY ARTERY BYPASS GRAFT  06/2009   descending,saphenous vein graft to first obtuse marginal, sequential saphenous vein graft to posterior descending and posterolateral  .  Endoscopic vein harvest right thigh    . ERCP N/A 06/26/2019   Procedure: ENDOSCOPIC RETROGRADE CHOLANGIOPANCREATOGRAPHY (ERCP);  Surgeon: Irving Copas., MD;  Location: [facility];  Service: Gastroenterology;  Laterality: N/A;  with stent   . IR RADIOLOGIST EVAL & MGMT  06/12/2018  . IR RADIOLOGIST EVAL & MGMT  09/18/2018  . IR RADIOLOGIST EVAL & MGMT  05/26/2019  . IR RADIOLOGIST EVAL & MGMT  07/29/2019  . PTCA    . REMOVAL OF  STONES  06/26/2019   Procedure: REMOVAL OF STONES;  Surgeon: Rush Landmark Telford Nab., MD;  Location: [facility];  Service: Gastroenterology;;  . Joan Mayans  06/26/2019   Procedure: Joan Mayans;  Surgeon: Irving Copas., MD;  Location: [facility];  Service: Gastroenterology;;  . TONSILLECTOMY    . UMBILICAL HERNIA REPAIR     2009  . VASECTOMY     Current Facility-Administered Medications  Medication Dose Route Frequency Provider Last Rate Last Admin  . 0.9 %  sodium chloride infusion   Intravenous Continuous Mansouraty, Telford Nab., MD      . lactated ringers infusion   Intravenous Continuous Mansouraty, Telford Nab., MD      . lactated ringers infusion    Continuous PRN Mansouraty, Telford Nab., MD 10 mL/hr at 11/18/19 0818 1,000 mL at 11/18/19 0818   Allergies  Allergen Reactions  . Sulfonamide Derivatives Nausea Only   Family History  Problem Relation Age of Onset  . Cancer Mother   . Cancer Father        pt points to LLQ as  area of cancer, so potentially could have been intestinal.     . Colon cancer Neg Hx   . Esophageal cancer Neg Hx   . Stomach cancer Neg Hx   . Rectal cancer Neg Hx    Social History   Socioeconomic History  . Marital status: Married    Spouse name: Not on file  . Number of children: 3  . Years of education: 51  . Highest education level: Not on file  Occupational History  . Not on file  Tobacco Use  . Smoking status: Former Smoker    Packs/day: 1.00    Years: 30.00    Pack years: 30.00    Types: Cigarettes    Quit date: 03/15/1983    Years since quitting: 36.7  . Smokeless tobacco: Never Used  . Tobacco comment: Quit 1984  Substance and Sexual Activity  . Alcohol use: Not Currently    Alcohol/week: 5.0 - 7.0 standard drinks    Types: 5 - 7 Glasses of wine per week    Comment: quit etoh 1999 but in ~ 2017 began having a glass of wine with dinner   . Drug use: No  . Sexual activity: Never  Other Topics Concern  . Not on file   Social History Narrative   Married 1957, has 3 sons.   [place]. 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    End of life Care: DNR, no prolonged heroic measures or prolonged supportive care.    Former smoker, quit 1984.   Quit alcohol when dx'd with DM in 2009. ~ 2017 began having glass of wine with dinner.   No exercise.   Social Determinants of Health   Financial Resource Strain:   . Difficulty of Paying Living Expenses: Not on file  Food Insecurity:   . Worried About Charity fundraiser in the Last Year: Not on  file  . [place] in the Last Year: Not on file  Transportation Needs: No Transportation Needs  . Lack of Transportation (Medical): No  . Lack of Transportation (Non-Medical): No  Physical Activity:   . Days of Exercise per Week: Not on file  . Minutes of Exercise per Session: Not on file  Stress:   . Feeling of Stress : Not on file  Social Connections:   . Frequency of Communication with Friends and Family: Not on file  . Frequency of Social Gatherings with Friends and Family: Not on file  . Attends Religious Services: Not on file  . Active Member of Clubs or Organizations: Not on file  . Attends Archivist Meetings: Not on file  . Marital Status: Not on file  Intimate Partner Violence:   . Fear of Current or Ex-Partner: Not on file  . Emotionally Abused: Not on file  . Physically Abused: Not on file  . Sexually Abused: Not on file    Physical Exam: Vital signs in last 24 hours: Temp:  [98.2 F (36.8 C)] 98.2 F (36.8 C) (12/30 0802) Pulse Rate:  [67] 67 (12/30 0802) Resp:  [18] 18 (12/30 0802) BP: (135)/(62) 135/62 (12/30 0802) SpO2:  [99 %] 99 % (12/30 0802) Weight:  [72.6 kg] 72.6 kg (12/30 0802)   GEN: NAD EYE: Sclerae anicteric ENT: MMM CV: Non-tachycardic GI: Soft, NT/ND NEURO:  Alert & Oriented x 3  Lab Results: No results for input(s): WBC, HGB, HCT, PLT in  the last 72 hours. BMET No results for input(s): NA, K, CL, CO2, GLUCOSE, BUN, CREATININE, CALCIUM in the last 72 hours. LFT No results for input(s): PROT, ALBUMIN, AST, ALT, ALKPHOS, BILITOT, BILIDIR, IBILI in the last 72 hours. PT/INR No results for input(s): LABPROT, INR in the last 72 hours.   Impression / Plan: This is a 84 y.o.male who presents for ERCP.  The risks of an ERCP were discussed at length, including but not limited to the risk of perforation, bleeding, abdominal pain, post-ERCP pancreatitis (while usually mild can be severe and even life threatening).  The risks and benefits of endoscopic evaluation were discussed with the patient; these include but are not limited to the risk of perforation, infection, bleeding, missed lesions, lack of diagnosis, severe illness requiring hospitalization, as well as anesthesia and sedation related illnesses.  The patient is agreeable to proceed.    Justice Britain, MD [facility]oscopy Office # [phone]

## 2019-11-18 NOTE — Discharge Instructions (Signed)
YOU HAD AN ENDOSCOPIC PROCEDURE TODAY: Refer to the procedure report and other information in the discharge instructions given to you for any specific questions about what was found during the examination. If this information does not answer your questions, please call [place] office at [phone] to clarify.   YOU SHOULD EXPECT: Some feelings of bloating in the abdomen. Passage of more gas than usual. Walking can help get rid of the air that was put into your GI tract during the procedure and reduce the bloating. If you had a lower endoscopy (such as a colonoscopy or flexible sigmoidoscopy) you may notice spotting of blood in your stool or on the toilet paper. Some abdominal soreness may be present for a day or two, also.  DIET: Your first meal following the procedure should be a light meal and then it is ok to progress to your normal diet. A half-sandwich or bowl of soup is an example of a good first meal. Heavy or fried foods are harder to digest and may make you feel nauseous or bloated. Drink plenty of fluids but you should avoid alcoholic beverages for 24 hours. If you had a esophageal dilation, please see attached instructions for diet.    ACTIVITY: Your care partner should take you home directly after the procedure. You should plan to take it easy, moving slowly for the rest of the day. You can resume normal activity the day after the procedure however YOU SHOULD NOT DRIVE, use power tools, machinery or perform tasks that involve climbing or major physical exertion for 24 hours (because of the sedation medicines used during the test).   SYMPTOMS TO REPORT IMMEDIATELY: A gastroenterologist can be reached at any hour. Please call [phone]  for any of the following symptoms:   Following upper endoscopy (EGD, EUS, ERCP, esophageal dilation) Vomiting of blood or coffee ground material  New, significant abdominal pain  New, significant chest pain or pain under the shoulder blades  Painful or  persistently difficult swallowing  New shortness of breath  Black, tarry-looking or red, bloody stools  FOLLOW UP:  If any biopsies were taken you will be contacted by phone or by letter within the next 1-3 weeks. Call [phone]  if you have not heard about the biopsies in 3 weeks.  Please also call with any specific questions about appointments or follow up tests.  

## 2019-11-18 NOTE — Anesthesia Postprocedure Evaluation (Signed)
Anesthesia Post Note  Patient: WHITTAKER LENIS  Procedure(s) Performed: ENDOSCOPIC RETROGRADE CHOLANGIOPANCREATOGRAPHY (ERCP) (N/A ) STENT REMOVAL BILIARY STENT PLACEMENT (N/A ) BILIARY DILATION REMOVAL OF STONES     Patient location during evaluation: Endoscopy Anesthesia Type: General Level of consciousness: awake Pain management: pain level controlled Vital Signs Assessment: post-procedure vital signs reviewed and stable Respiratory status: spontaneous breathing Cardiovascular status: stable Postop Assessment: no apparent nausea or vomiting Anesthetic complications: no    Last Vitals:  Vitals:   11/18/19 1120 11/18/19 1140  BP: (!) 150/43 139/70  Pulse: 73 64  Resp: 16 15  Temp: 36.7 C   SpO2: 100% 98%    Last Pain:  Vitals:   11/18/19 1140  TempSrc:   PainSc: 0-No pain                 Shakiya Mcneary

## 2019-11-19 ENCOUNTER — Encounter: Payer: Self-pay | Admitting: *Deleted

## 2019-11-19 ENCOUNTER — Other Ambulatory Visit: Payer: Self-pay | Admitting: Family Medicine

## 2019-11-24 ENCOUNTER — Other Ambulatory Visit: Payer: Self-pay | Admitting: Family Medicine

## 2019-11-24 ENCOUNTER — Ambulatory Visit: Payer: Medicare Other | Admitting: Family Medicine

## 2019-11-24 ENCOUNTER — Other Ambulatory Visit: Payer: Self-pay

## 2019-11-24 DIAGNOSIS — E039 Hypothyroidism, unspecified: Secondary | ICD-10-CM

## 2019-11-25 NOTE — Telephone Encounter (Signed)
Patient has appt to get TSH drawn 11/26/19.  He should have enough RX to get him through until then.

## 2019-11-26 ENCOUNTER — Other Ambulatory Visit: Payer: Self-pay

## 2019-11-26 ENCOUNTER — Encounter: Payer: Self-pay | Admitting: Family Medicine

## 2019-11-26 ENCOUNTER — Ambulatory Visit (INDEPENDENT_AMBULATORY_CARE_PROVIDER_SITE_OTHER): Payer: Medicare Other | Admitting: Family Medicine

## 2019-11-26 DIAGNOSIS — E039 Hypothyroidism, unspecified: Secondary | ICD-10-CM | POA: Diagnosis not present

## 2019-11-26 LAB — TSH: TSH: 3.75 u[IU]/mL (ref 0.35–4.50)

## 2019-12-01 ENCOUNTER — Other Ambulatory Visit: Payer: Self-pay | Admitting: Family Medicine

## 2019-12-06 ENCOUNTER — Ambulatory Visit: Payer: Medicare Other | Attending: Internal Medicine

## 2019-12-06 DIAGNOSIS — Z23 Encounter for immunization: Secondary | ICD-10-CM | POA: Diagnosis not present

## 2019-12-06 NOTE — Progress Notes (Signed)
   Covid-19 Vaccination Clinic  Name:  Ricardo Washington    MRN: [id number] DOB: 1934/12/10  12/06/2019  Ricardo Washington was observed post Covid-19 immunization for 15 minutes without incidence. He was provided with Vaccine Information Sheet and instruction to access the V-Safe system.   Ricardo Washington was instructed to call 911 with any severe reactions post vaccine: Marland Kitchen Difficulty breathing  . Swelling of your face and throat  . A fast heartbeat  . A bad rash all over your body  . Dizziness and weakness   Patient was discharge at 10:56.

## 2019-12-08 ENCOUNTER — Telehealth: Payer: Self-pay | Admitting: Family Medicine

## 2019-12-08 NOTE — Telephone Encounter (Signed)
Wife, Ashan Cueva, dropped off forms to complete.  [employer] incapacity form.  Patient and wife were attempting to make transfers within accounts, and patient was not able to remember security questions.  Please call Mechele Claude when completed and mailed to [facility]. There is a self-addressed stamped envelope attached.  Thank you

## 2019-12-09 NOTE — Telephone Encounter (Signed)
Patient's wife, Arville Go advised form will be mailed.

## 2019-12-09 NOTE — Telephone Encounter (Signed)
Given to PCP to sign, will place up front after.

## 2019-12-16 ENCOUNTER — Other Ambulatory Visit: Payer: Self-pay | Admitting: Family Medicine

## 2019-12-27 ENCOUNTER — Ambulatory Visit: Payer: Medicare Other | Attending: Internal Medicine

## 2019-12-27 DIAGNOSIS — Z23 Encounter for immunization: Secondary | ICD-10-CM | POA: Insufficient documentation

## 2019-12-27 NOTE — Progress Notes (Signed)
   Covid-19 Vaccination Clinic  Name:  NEFTALI ABAIR    MRN: [id number] DOB: [birth year]  12/27/2019  Mr. Sui was observed post Covid-19 immunization for 15 minutes without incidence. He was provided with Vaccine Information Sheet and instruction to access the V-Safe system.   Mr. Norwood was instructed to call 911 with any severe reactions post vaccine: Marland Kitchen Difficulty breathing  . Swelling of your face and throat  . A fast heartbeat  . A bad rash all over your body  . Dizziness and weakness    Immunizations Administered    Name Date Dose VIS Date Route   Pfizer COVID-19 Vaccine 12/27/2019 10:07 AM 0.3 mL 10/30/2019 Intramuscular   Manufacturer: [place]   Lot: [id number]   [place]: [id number]

## 2019-12-29 NOTE — Telephone Encounter (Signed)
Wife, Ricardo Washington, dropped off form to complete. She came in on 1/19 and dropped off form regarding [employer] incapacity form. The form was incorrect.   Please call Mechele Claude when mailed to [facility]. There is a self-addressed stamped envelope attached.  Thank you

## 2020-01-01 NOTE — Telephone Encounter (Signed)
Form has been completed and will be sent out for mailing. Patient called to be notified regarding this.

## 2020-02-01 ENCOUNTER — Telehealth: Payer: Self-pay | Admitting: Gastroenterology

## 2020-02-01 NOTE — Telephone Encounter (Signed)
July from [facility] called asking for a dx for Indocin. She stated that they received this order for pt. She stated that this is their second attempt to get this information. Pls call [employer] back and give the pt's name and DOB.

## 2020-02-01 NOTE — Telephone Encounter (Signed)
Tye [place] this is not a medication that was prescribed in the office pt. This was part of the procedure that he had done at the hospital. This is a billing issue. I would not handle this. This should be sent to someone who handles the billing for the hospital.

## 2020-02-01 NOTE — Telephone Encounter (Signed)
Thank you, Rovonda. I was advised that this has been taken care of.

## 2020-02-13 ENCOUNTER — Other Ambulatory Visit: Payer: Self-pay | Admitting: Family Medicine

## 2020-02-19 ENCOUNTER — Encounter (HOSPITAL_COMMUNITY)
Admission: RE | Admit: 2020-02-19 | Discharge: 2020-02-19 | Disposition: A | Discharge status: Home / Self Care | Payer: Medicare Other | Source: Ambulatory Visit | Attending: Hematology | Admitting: Hematology

## 2020-02-19 ENCOUNTER — Other Ambulatory Visit: Payer: Self-pay

## 2020-02-19 DIAGNOSIS — C221 Intrahepatic bile duct carcinoma: Secondary | ICD-10-CM | POA: Diagnosis not present

## 2020-02-19 DIAGNOSIS — R932 Abnormal findings on diagnostic imaging of liver and biliary tract: Secondary | ICD-10-CM | POA: Insufficient documentation

## 2020-02-19 LAB — GLUCOSE, CAPILLARY: Glucose-Capillary: 111 mg/dL — ABNORMAL HIGH (ref 70–99)

## 2020-02-19 MED ORDER — FLUDEOXYGLUCOSE F - 18 (FDG) INJECTION
7.9000 | Freq: Once | INTRAVENOUS | Status: AC | PRN
  Administered 2020-02-19: 09:00:00 7.9 via INTRAVENOUS

## 2020-02-24 ENCOUNTER — Other Ambulatory Visit: Payer: Self-pay

## 2020-02-25 ENCOUNTER — Ambulatory Visit (INDEPENDENT_AMBULATORY_CARE_PROVIDER_SITE_OTHER): Payer: Medicare Other | Admitting: Family Medicine

## 2020-02-25 ENCOUNTER — Other Ambulatory Visit: Payer: Self-pay

## 2020-02-25 ENCOUNTER — Encounter: Payer: Self-pay | Admitting: Family Medicine

## 2020-02-25 VITALS — BP 125/74 | HR 75 | Temp 98.4°F | Resp 16 | Ht 65.0 in | Wt 151.2 lb

## 2020-02-25 DIAGNOSIS — E039 Hypothyroidism, unspecified: Secondary | ICD-10-CM | POA: Diagnosis not present

## 2020-02-25 DIAGNOSIS — E7800 Pure hypercholesterolemia, unspecified: Secondary | ICD-10-CM

## 2020-02-25 DIAGNOSIS — E119 Type 2 diabetes mellitus without complications: Secondary | ICD-10-CM | POA: Diagnosis not present

## 2020-02-25 DIAGNOSIS — C221 Intrahepatic bile duct carcinoma: Secondary | ICD-10-CM | POA: Diagnosis not present

## 2020-02-25 NOTE — Progress Notes (Signed)
OFFICE VISIT  02/25/2020   CC:  Chief Complaint  Patient presents with  . Follow-up    RCI, pt is not fasting   HPI:    Patient is a 85 y.o. Caucasian male who presents accompanied by his wife for 5 mo f/u DM, HTN, hypothyroidism, and dementia w/out behavioral disturbance. Has cholangiocarcinoma-->metal biliary stent placed 10/2019.  A/P as of last visit: "1) HTN, with low norma or low bp typically when he is in office, but is low today and wife feels like he acts like it is low at home sometimes.  PO intake doesn't seem to be an issue. D/c lopressor and monitor bp at home.  2) DM 2, hx of good control.  He eats what he wants.  No exercise due to frailty. Feet exam normal today. Hba1c today. Lytes/cr today.  3) Dementia: gradually progressing memory problems but no signif behav disturbance or signif impairment in ADLs. Wife definitely needs to be with him 24/7. Failed trials of exelon, aricept, and exelon.  Neurologist referral offered but pt/wife declined.  4) subclinical hypothyroidism: monitor TSH and free 4 and T3 today."  Interim hx:  Hypothyroidism: Ended up starting 25 mcg T4 and f/u TSH normalized 11/2019.  Feeling good, eating well, no abd pain. Got PET scan repeat done last week->goes to [facility]col to discuss results tomorrow.  DM: no glucose checks. Hypoth: takes on empty stomach w/out other meds.  HTN: no home bp monitoring.  Wife says she no longer thinks he has any periods of acting like he has low bp like he had before we stopped his metoprolol.  Dementia: requires 24h supervision.  Independent in all ADLs.   Biggest issue is poor memory and he has days and nights mixed up: up frequently at night.  He insists on driving sometimes, wife is scared and tries to drive most of the time.   Past Medical History:  Diagnosis Date  . BPH with obstruction/lower urinary tract symptoms 10/27/2015   Dr. Karsten Ro  . Cataract   . Cholangiocarcinoma ([place]) 2020   Mass  detected 04/2018.  Intrahepatic cholangiocarcinoma of R hepatic lobe; stage 1A, cT1aN0M0->not candidate for intervention or chemo, Dr. Mammie Lorenzo finished 08/2019.  Biliary stent temporary->to be replaced by metal stent 10/2019.  Marland Kitchen Colon wall thickening 04/2018   "mass-like" per radiologist interpretation; colonoscopy as next step---f/u colonoscopy showed 'tics but o/w normal.  NO further colonoscopies needed due to age.  . Coronary artery disease    with preserved LV function.  . Dementia ([place])   . Diabetes mellitus without complication ([place])   . Diverticulosis of colon    severe, entire colon  . Dysphagia 11/2018   Barium swallow: mild esophageal dysmotility.  . Ectatic abdominal aorta ([place]) 01/2017   Abd u/s: 2.9 cm aortic ectasia--at risk for aneurism development.  Recheck aortic u/s 5 yrs.  . Erectile dysfunction due to arterial insufficiency   . Fatigue   . Gout    always 2nd toe L foot (uric acid 6.20 Dec 2014 per old records)  . History of adenomatous polyp of colon 2002  . History of stomach ulcers   . Hyperlipidemia   . Hypertension   . Hypogonadism male   . Hypothyroidism 09/2019   started low dose synthroid 10/05/19->TSH normalized 11/2019  . Klebsiella sepsis ([place]) 01/2017   due to acute biliary tract infection (no stones) and acute diverticulitis.  . Macrocytic anemia 01/2017   vit B12 borderline low and iron borderline low: checking hemoccults  and starting vit B12 PO and iron PO as of 02/11/17.  Vit B12 and folate normal as of GI f/u 06/2018.  . Myogenic ptosis of bilateral eyelids 2018   Plastic surgery in [place], [place] to do surg as of 03/2017.  Marland Kitchen NASH (nonalcoholic steatohepatitis) 06/2017   LFTs up, abd u/s showed fatty liver but no other abnormality.  . Osteoarthritis of right shoulder 09/2018   Near end-stage --->glenohumeral joint-->intra-articular steroid injection 09/2018 (Dr. Delilah Shan).  11/2018-->end stage, but not ready for total shoulder replacement.  . Past use of  tobacco    quit 1984  . Rectal bleeding   . Rosacea   . S/P coronary artery bypass graft x 3   . Urine incontinence    Dr. Karsten Ro    Past Surgical History:  Procedure Laterality Date  . BILIARY BRUSHING  06/26/2019   PATH: invasive adenocarcinoma.  Procedure: BILIARY BRUSHING;  Surgeon: Rush Landmark Telford Nab., MD;  Location: [facility];  Service: Gastroenterology;;  . BILIARY DILATION  06/26/2019   Procedure: BILIARY DILATION;  Surgeon: Irving Copas., MD;  Location: [facility];  Service: Gastroenterology;;  . BILIARY DILATION  11/18/2019   Procedure: BILIARY DILATION;  Surgeon: Irving Copas., MD;  Location: [facility];  Service: Gastroenterology;;  . BILIARY STENT PLACEMENT  06/26/2019   Procedure: BILIARY STENT PLACEMENT;  Surgeon: Irving Copas., MD;  Location: [facility];  Service: Gastroenterology;;  . BILIARY STENT PLACEMENT N/A 11/18/2019   Procedure: BILIARY STENT PLACEMENT;  Surgeon: Irving Copas., MD;  Location: [facility];  Service: Gastroenterology;  Laterality: N/A;  . CARDIOVASCULAR STRESS TEST  08/28/2016   Low risk myoview, normal EF, no ischemia.  Marland Kitchen CATARACT EXTRACTION, BILATERAL    . COLONOSCOPY  2002, 2006, 02/25/2009; 06/11/18   Polyp x 1 2002, none 2006 or 2010.  Adenomatous polyp 06/11/18+  signif diverticulosis.  NO FURTHER COLONOSCOPIES DUE TO AGE.  Marland Kitchen CORONARY ARTERY BYPASS GRAFT  06/2009   descending,saphenous vein graft to first obtuse marginal, sequential saphenous vein graft to posterior descending and posterolateral  . Endoscopic vein harvest right thigh    . ERCP N/A 06/26/2019   Procedure: ENDOSCOPIC RETROGRADE CHOLANGIOPANCREATOGRAPHY (ERCP);  Surgeon: Irving Copas., MD;  Location: [facility];  Service: Gastroenterology;  Laterality: N/A;  with stent   . ERCP N/A 11/18/2019   Procedure: ENDOSCOPIC RETROGRADE CHOLANGIOPANCREATOGRAPHY (ERCP);  Surgeon: Irving Copas., MD;  Location: [facility];   Service: Gastroenterology;  Laterality: N/A;  . IR RADIOLOGIST EVAL & MGMT  06/12/2018  . IR RADIOLOGIST EVAL & MGMT  09/18/2018  . IR RADIOLOGIST EVAL & MGMT  05/26/2019  . IR RADIOLOGIST EVAL & MGMT  07/29/2019  . PTCA    . REMOVAL OF STONES  06/26/2019   Procedure: REMOVAL OF STONES;  Surgeon: Rush Landmark Telford Nab., MD;  Location: [facility];  Service: Gastroenterology;;  . REMOVAL OF STONES  11/18/2019   Procedure: REMOVAL OF STONES;  Surgeon: Irving Copas., MD;  Location: [facility];  Service: Gastroenterology;;  . Joan Mayans  06/26/2019   Procedure: Joan Mayans;  Surgeon: Irving Copas., MD;  Location: [facility];  Service: Gastroenterology;;  . Lavell Islam REMOVAL  11/18/2019   Procedure: STENT REMOVAL;  Surgeon: Irving Copas., MD;  Location: [facility];  Service: Gastroenterology;;  . TONSILLECTOMY    . UMBILICAL HERNIA REPAIR     2009  . VASECTOMY      Outpatient Medications Prior to Visit  Medication Sig Dispense Refill  . allopurinol (ZYLOPRIM) 100 MG  tablet TAKE ONE TABLET BY MOUTH ON MONDAY, WEDNESDAY, AND FRIDAY (Patient taking differently: Take 100 mg by mouth every Monday, Wednesday, and Friday. In the morning.) 36 tablet 3  . aspirin 325 MG tablet Take 325 mg by mouth daily.      . ferrous sulfate 325 (65 FE) MG EC tablet Take 325 mg by mouth daily with breakfast.    . finasteride (PROSCAR) 5 MG tablet TAKE ONE TABLET BY MOUTH EVERY DAY 30 tablet 0  . levothyroxine (SYNTHROID) 25 MCG tablet Take 1 tablet (25 mcg total) by mouth daily before breakfast. 90 tablet 1  . lisinopril (ZESTRIL) 5 MG tablet TAKE ONE TABLET BY MOUTH DAILY (Patient taking differently: Take 5 mg by mouth daily. ) 90 tablet 1  . metFORMIN (GLUCOPHAGE-XR) 500 MG 24 hr tablet TAKE ONE TABLET BY MOUTH TWICE DAILY WITH MEALS (Patient taking differently: Take 500 mg by mouth 2 (two) times daily with a meal. ) 180 tablet 1  . Multiple Vitamin (MULTIVITAMIN WITH MINERALS) TABS  tablet Take 1 tablet by mouth at bedtime.    Marland Kitchen omeprazole (PRILOSEC) 40 MG capsule TAKE ONE CAPSULE BY MOUTH EVERY DAY 30 capsule 3  . rosuvastatin (CRESTOR) 10 MG tablet TAKE ONE TABLET BY MOUTH EVERY DAY 30 tablet 0  . tamsulosin (FLOMAX) 0.4 MG CAPS capsule TAKE ONE CAPSULE BY MOUTH DAILY 90 capsule 3  . Trospium Chloride 60 MG CP24 TAKE ONE CAPSULE BY MOUTH EVERY DAY 90 capsule 1  . betamethasone dipropionate (DIPROLENE) 0.05 % cream Apply 1 application topically daily as needed (skin irritation.).     Marland Kitchen Lancets (ONETOUCH ULTRASOFT) lancets Use to check blood sugar once daily (Patient not taking: Reported on 02/25/2020) 100 each 12  . metroNIDAZOLE (METROCREAM) 0.75 % cream metronidazole 0.75 % topical cream    . neomycin-polymyxin-dexameth (MAXITROL) 0.1 % OINT neomycin 3.5 mg/g-polymyxin B 10,000 unit/g-dexameth 0.1 % eye oint     No facility-administered medications prior to visit.    Allergies  Allergen Reactions  . Sulfonamide Derivatives Nausea Only    ROS As per HPI  PE: Blood pressure 125/74, pulse 75, temperature 98.4 F (36.9 C), temperature source Temporal, resp. rate 16, height 5' 5"  (1.651 m), weight 151 lb 3.2 oz (68.6 kg), SpO2 98 %. Body mass index is 25.16 kg/m.  Gen: Alert, well appearing.  Patient is oriented to person and general place and situation. AFFECT: pleasant. CV: RRR, no m/r/g.   LUNGS: CTA bilat, nonlabored resps, good aeration in all lung fields. ABD: soft, NT/ND, some peri-umbilical sub Q indurated "lumpiness" is palpable.  No intra-abdominal mass is palpable.  No HSM or bruit.  BS normal. EXT: no clubbing or cyanosis.  bilat 1-2+ pitting edema in ankles   LABS:  Lab Results  Component Value Date   TSH 3.75 11/26/2019   Lab Results  Component Value Date   WBC 5.7 08/28/2019   HGB 12.6 (L) 08/28/2019   HCT 37.8 (L) 08/28/2019   MCV 105.0 (H) 08/28/2019   PLT 192 08/28/2019   Lab Results  Component Value Date   IRON 44 07/03/2019    TIBC 285 07/03/2019   FERRITIN 168 07/03/2019   Lab Results  Component Value Date   VITAMINB12 1,966 (H) 07/03/2019    Lab Results  Component Value Date   CREATININE 0.96 10/06/2019   BUN 17 10/06/2019   NA 141 10/06/2019   K 4.2 10/06/2019   CL 106 10/06/2019   CO2 26 10/06/2019   Lab Results  Component Value Date   ALT 14 10/06/2019   AST 17 10/06/2019   ALKPHOS 93 10/06/2019   BILITOT 0.4 10/06/2019   Lab Results  Component Value Date   CHOL 104 06/05/2019   Lab Results  Component Value Date   HDL 40.50 06/05/2019   Lab Results  Component Value Date   LDLCALC 52 06/05/2019   Lab Results  Component Value Date   TRIG 58.0 06/05/2019   Lab Results  Component Value Date   CHOLHDL 3 06/05/2019   Lab Results  Component Value Date   HGBA1C 5.9 10/02/2019   IMPRESSION AND PLAN:  1) Hypothyroidism: just recently started T4 about 4 mo ago. Monitor TSH today.  2) DM 2, on metformin.  Control historically has been good. A1c today.  Lytes/cr today. If A1c <6% again this time then I'll d/c his metformin.  3) HTN: well controlled.  Since getting off metoprolol he has no longer had spells in which wife feels like his bp is low. Lytes/cr today. Continue lisinopril 56m qd.  4) Dementia w/out behav disturbance. Memory declining gradually but he's still largely functional. Sleep impairment is biggest behavioral problem. I emphasized today that he should not drive. Obs.  5) Cholangiocarcinoma: has had SBRT.  Has biliary stent. Recent PET surveillance/restaging imaging done, has f/u with oncol soon to discuss.  An After Visit Summary was printed and given to the patient.  FOLLOW UP: Return in about 6 months (around 08/26/2020) for routine chronic illness f/u.  Signed:  PCrissie Sickles MD           02/25/2020

## 2020-02-26 ENCOUNTER — Other Ambulatory Visit: Payer: Self-pay

## 2020-02-26 ENCOUNTER — Inpatient Hospital Stay: Payer: Medicare Other | Attending: Hematology

## 2020-02-26 ENCOUNTER — Telehealth: Payer: Self-pay

## 2020-02-26 ENCOUNTER — Encounter: Payer: Self-pay | Admitting: Hematology

## 2020-02-26 ENCOUNTER — Inpatient Hospital Stay (HOSPITAL_BASED_OUTPATIENT_CLINIC_OR_DEPARTMENT_OTHER): Payer: Medicare Other | Admitting: Hematology

## 2020-02-26 ENCOUNTER — Other Ambulatory Visit: Payer: Self-pay | Admitting: Family Medicine

## 2020-02-26 VITALS — BP 119/60 | HR 69 | Temp 97.5°F | Resp 20 | Ht 65.0 in | Wt 149.0 lb

## 2020-02-26 DIAGNOSIS — C221 Intrahepatic bile duct carcinoma: Secondary | ICD-10-CM

## 2020-02-26 DIAGNOSIS — D539 Nutritional anemia, unspecified: Secondary | ICD-10-CM

## 2020-02-26 DIAGNOSIS — G309 Alzheimer's disease, unspecified: Secondary | ICD-10-CM | POA: Insufficient documentation

## 2020-02-26 DIAGNOSIS — R748 Abnormal levels of other serum enzymes: Secondary | ICD-10-CM | POA: Diagnosis not present

## 2020-02-26 DIAGNOSIS — F0280 Dementia in other diseases classified elsewhere without behavioral disturbance: Secondary | ICD-10-CM | POA: Diagnosis not present

## 2020-02-26 LAB — CMP (CANCER CENTER ONLY)
ALT: 27 U/L (ref 0–44)
AST: 28 U/L (ref 15–41)
Albumin: 3.8 g/dL (ref 3.5–5.0)
Alkaline Phosphatase: 187 U/L — ABNORMAL HIGH (ref 38–126)
Anion gap: 6 (ref 5–15)
BUN: 16 mg/dL (ref 8–23)
CO2: 30 mmol/L (ref 22–32)
Calcium: 9.2 mg/dL (ref 8.9–10.3)
Chloride: 105 mmol/L (ref 98–111)
Creatinine: 1.03 mg/dL (ref 0.61–1.24)
GFR, Est AFR Am: >60 mL/min (ref >60)
GFR, Estimated: >60 mL/min (ref >60)
Glucose, Bld: 170 mg/dL — ABNORMAL HIGH (ref 70–99)
Potassium: 4.7 mmol/L (ref 3.5–5.1)
Sodium: 141 mmol/L (ref 135–145)
Total Bilirubin: 0.5 mg/dL (ref 0.3–1.2)
Total Protein: 6.2 g/dL — ABNORMAL LOW (ref 6.5–8.1)

## 2020-02-26 LAB — CBC WITH DIFFERENTIAL (CANCER CENTER ONLY)
Abs Immature Granulocytes: 0.02 10*3/uL (ref 0.00–0.07)
Basophils Absolute: 0 10*3/uL (ref 0.0–0.1)
Basophils Relative: 1 %
Eosinophils Absolute: 0.1 10*3/uL (ref 0.0–0.5)
Eosinophils Relative: 2 %
HCT: 38 % — ABNORMAL LOW (ref 39.0–52.0)
Hemoglobin: 12.7 g/dL — ABNORMAL LOW (ref 13.0–17.0)
Immature Granulocytes: 0 %
Lymphocytes Relative: 20 %
Lymphs Abs: 1 10*3/uL (ref 0.7–4.0)
MCH: 35.1 pg — ABNORMAL HIGH (ref 26.0–34.0)
MCHC: 33.4 g/dL (ref 30.0–36.0)
MCV: 105 fL — ABNORMAL HIGH (ref 80.0–100.0)
Monocytes Absolute: 0.4 10*3/uL (ref 0.1–1.0)
Monocytes Relative: 9 %
Neutro Abs: 3.4 10*3/uL (ref 1.7–7.7)
Neutrophils Relative %: 68 %
Platelet Count: 189 10*3/uL (ref 150–400)
RBC: 3.62 MIL/uL — ABNORMAL LOW (ref 4.22–5.81)
RDW: 13.9 % (ref 11.5–15.5)
WBC Count: 5 10*3/uL (ref 4.0–10.5)
nRBC: 0 % (ref 0.0–0.2)

## 2020-02-26 MED ORDER — FINASTERIDE 5 MG PO TABS: 5.0000 mg | ORAL_TABLET | Freq: Every day | ORAL | 1 refills | Status: AC

## 2020-02-26 MED ORDER — ROSUVASTATIN CALCIUM 10 MG PO TABS: 10.0000 mg | ORAL_TABLET | Freq: Every day | ORAL | 1 refills | Status: AC

## 2020-02-26 NOTE — Progress Notes (Signed)
[facility] OFFICE PROGRESS NOTE  Patient Care Team: [name], MD as PCP - General (Family Medicine) Josue Hector, MD as PCP - Cardiology (Cardiology) Kathie Rhodes, MD as Consulting Physician (Urology) Lavonna Monarch, MD as Consulting Physician (Dermatology) Jackquline Denmark, MD as Consulting Physician (Gastroenterology) Milus Banister, MD as Consulting Physician (Gastroenterology) Gerrit Halls, PA-C as Physician Assistant (Orthopedic Surgery) Sydnee Cabal, MD as Consulting Physician (Orthopedic Surgery) Pedro Earls, MD as Consulting Physician (Sports Medicine) Kyung Rudd, MD as Consulting Physician (Radiation Oncology) Tish Men, MD as Consulting Physician (Hematology)  HEME/ONC OVERVIEW: 1. 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 Ardis Hughs) or IR bx (Dr. Reesa Chew) -08/2018: persistent FDG uptake in the liver without progression or metastatic disease; CA 19-9 normal -06/2019:  Persistent duct dilatation in segment 8 of the R hepatic lobe w/ possible central obstruction on MRI abdomen; no new lesion   ERCP with brushing; path showed high-grade glandular dysplagia with microscopic focus of invasive adenocarcinoma   CT triple-phase showed ill-defined area in the central right liver, no abdominal lymphadenopathy -08/2019:  not a candidate for ablation by IR; SBRT to the liver lesion, 5 fractions -02/2020: stable FDG uptake in the R hepatic lobe (SUV 5.1); no evidence of progressive or metastatic disease   TREATMENT REGIMEN:  08/18/2019 - 08/28/2019: SBRT x 5 fractions to the liver lesion   PERTINENT NON-HEM/ONC PROBLEMS: 1. CAD s/p 3-vessel CABG in 2012  2. NASH  3. 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  In summary, PET showed stable/modestly decreasing FDG uptake in the central right hepatic lobe without well-defined mass.  There was no evidence of progressive or metastatic disease. -I reviewed imaging results in detail with the patient and his spouse. -Given the overall stable/modestly improving results on PET, I do not think that there is an urgent need to pursue further treatment -Furthermore, the patient's dementia has become progressively more severe, and before pursuing any further invasive testing or treatment, there needs to be a discussion about goals of care -I have ordered surveillance CT chest, abdomen and pelvis in 3 months.  If disease remains stable, then we may be able to reduce the frequency of surveillance imaging to every 6 months.  Macrocytic anemia -Possibly due to underlying liver disease -Hgb 12.7 today, stable; nutritional studies, including iron, B12, folate, were normal -Clinically, patient denies any symptoms of bleeding -We will monitor it for now   Elevated alkaline phosphatase -Alk phos 187, fluctuating but close to baseline -Recent PET showed possible intrahepatic duct dilatation, but the quality of images was suboptimal -Given the normal Tbili and overall stable alkaline phosphatase, there is no urgent indication to obtain dedicated liver imaging studies -CT triple phase in 3 months to assess the liver as outlined above, unless he develops progressive hyperbilirubinemia  Severe Alzheimer's dementia -According to the patient's spouse, his dementia has been steadily progressing over the past few months -Only oriented to person and place today  -As discussed above, the patient is unlikely  to be able  to tolerate any systemic therapy, and there needs to be a frank goals of care discussion if the patient is found with progressive disease  Orders Placed This Encounter  Procedures  . CT CHEST W CONTRAST    Standing Status:   Future    Standing Expiration Date:   02/25/2021    Scheduling Instructions:     Pls schedule in early July 2021    Order Specific Question:   ** REASON FOR EXAM (FREE TEXT)    Answer:   S/p SBRT    Order Specific Question:   If indicated for the ordered procedure, I authorize the administration of contrast media per Radiology protocol    Answer:   Yes    Order Specific Question:   Preferred imaging location?    Answer:   [facility] Specific Question:   Radiology Contrast Protocol - do NOT remove file path    Answer:   _0 [email or web address]  . CT ABDOMEN PELVIS W CONTRAST    Standing Status:   Future    Standing Expiration Date:   02/25/2021    Scheduling Instructions:     Triple phase CT    Order Specific Question:   ** REASON FOR EXAM (FREE TEXT)    Answer:   Tripel phase CT to monitor cholangiocarcinoma    Order Specific Question:   If indicated for the ordered procedure, I authorize the administration of contrast media per Radiology protocol    Answer:   Yes    Order Specific Question:   Preferred imaging location?    Answer:   [facility] Specific Question:   Is Oral Contrast requested for this exam?    Answer:   Yes, Per Radiology protocol    Order Specific Question:   Radiology Contrast Protocol - do NOT remove file path    Answer:   _1 [email or web address]  . CBC with Differential ([place] Only)    Standing Status:   Future    Standing Expiration Date:   04/01/2021  . CMP ([place] only)    Standing Status:   Future    Standing Expiration Date:   04/01/2021  . CA 19.9    Standing Status:   Future    Standing Expiration Date:   02/25/2021   The total  time spent in the encounter was 35 minutes, including face-to-face time with the patient, review of various tests results, order additional studies/medications, documentation, and coordination of care plan.   All questions were answered. The patient knows to call the clinic with any problems, questions or concerns. No barriers to learning was detected.  Return in 3 months for labs, CT results and clinic appt.   Tish Men, MD 4/9/202110:19 AM  CHIEF COMPLAINT: "I am doing fine"  INTERVAL HISTORY: Mr. Ricardo Washington returns clinic for follow-up of localized cholangiocarcinoma of the right liver.  The patient tolerated radiation treatment well late 2020, but he does not remember receiving any treatment due to his progressively worsening Alzheimer's dementia.  According to the patient's wife, his memory is becoming progressively worse.  Patient reports doing well today, and denies any complaints.  REVIEW OF SYSTEMS:   Constitutional: ( - ) fevers, ( - )  chills , ( - ) night sweats Eyes: ( - ) blurriness of vision, ( - ) double vision, ( - ) watery eyes Ears, nose, mouth, throat, and face: ( - ) mucositis, ( - ) sore throat Respiratory: ( - )  cough, ( - ) dyspnea, ( - ) wheezes Cardiovascular: ( - ) palpitation, ( - ) chest discomfort, ( - ) lower extremity swelling Gastrointestinal:  ( - ) nausea, ( - ) heartburn, ( - ) change in bowel habits Skin: ( - ) abnormal skin rashes Lymphatics: ( - ) new lymphadenopathy, ( - ) easy bruising Neurological: ( - ) numbness, ( - ) tingling, ( - ) new weaknesses Behavioral/Psych: ( - ) mood change, ( - ) new changes  All other systems were reviewed with the patient and are negative.  SUMMARY OF ONCOLOGIC HISTORY: Oncology History  Cholangiocarcinoma [facility])   Initial Diagnosis   Cholangiocarcinoma ([place])   04/30/2018 Imaging   CT abdomen/pelvis w/ contrast: IMPRESSION: 1. There are findings in the distal sigmoid colon which could be related to acute  diverticulitis. However, there is also mass-like thickening of the colon throughout this region. This may simply be reflective of a combination of acute and chronic diverticular disease, however, correlation with nonemergent colonoscopy is recommended after resolution of the patient's acute illness to exclude the possibility of colorectal neoplasm. 2. Focal intrahepatic biliary ductal dilatation most evident in segment 8 of the liver where there is also some regional atrophy. Ill-defined hypovascular area near the hepatic hilum. The possibility of a central neoplasm should be considered, and further evaluation with nonemergent MRI of the abdomen with and without IV gadolinium is strongly recommended in the near future. 3. Aortic atherosclerosis, in addition to 2 vessel coronary artery disease. 4. Additional incidental findings, as above.   05/03/2018 Imaging   MRI abdomen: 1. Mild motion degradation. 2. Intrahepatic duct dilatation centered in the periphery of segment 8 with a central area of subtle ductal caught off, probable mild T2 hyperintensity, and delayed post-contrast hypointensity or hypoenhancement. 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 Potential clinical strategies include focused ultrasound to allow possible sampling, PET, or if patient is not a good biopsy or surgical candidate, follow-up pre and post contrast abdominal MRI at 3 months. 3.  Aortic Atherosclerosis (ICD10-I70.0).   05/19/2018 Imaging   PET: IMPRESSION: 1. The corresponding to the MR abnormality, there is a focal area of increased uptake within the central aspect of segment 8 which is suspicious for underlying neoplasm, favor cholangiocarcinoma. 2. No additional areas of increased uptake identified to suggest metastatic disease.   08/20/2018 Imaging   PET: IMPRESSION: 1. Focus of activity centrally within liver remains but is slightly less intense and less focal. No clear evidence of  new lesions within the liver. 2. No evidence of metastatic disease outside the liver. 3. Extensive activity in the bowel is physiologic and could mask a colon lesion.   06/24/2019 Imaging   MRI abdomen: IMPRESSION: 1. Persistent duct dilatation in segment 8/6 of the RIGHT hepatic lobe with suspicion of region central obstruction identified by hypermetabolic tissue on comparison FDG PET scan. The MRI findings are very similar to MRI of 05/03/2018. The tumor is very poorly demonstrated on current exam. There is some limitation to the imaging due to patient body motion. 2. Normal distal common bile duct.  Normal pancreas.   06/26/2019 Procedure   ERCP: Impression:       - J-shaped deformity in the entire stomach.                           - Duodenal diverticulum.                           -  The major papilla appeared normal.                           - The fluoroscopic examination was suspicious for                            sludge.                           - A single mild biliary narrowing was found in the                            right main hepatic duct region. The stricture was                            indeterminate. This was brushed.                           - The right main hepatic duct was moderately                            dilated.                           - Choledocholithiasis, hemobilia, biliary sludge                            was found. Complete removal was accomplished by                            biliary sphincterotomy and balloon sphincteroplasty.                           - Tissue from within the biliary tree was also                            suctioned and placed into 2 separate jars for                            histology purposes.                           - One plastic biliary stent was placed into the                            right hepatic duct in order to attempt to traverse                            the biliary narrowing.   06/26/2019 Pathology  Results   Accession: [id number]  1. Common Bile Duct , #1 - AT LEAST HIGH GRADE GLANDULAR DYSPLASIA. - SEE COMMENT. 2. Common Bile Duct , # 2 - HIGH GRADE GLANDULAR DYSPLASIA WITH MICROSCOPIC FOCI OF INVASIVE ADENOCARCINOMA. - SEE COMMENT.   07/09/2019 Imaging   CT abdomen/pelvis triple phase: IMPRESSION: 1. Ill-defined subtle area of differential perfusion identified in the central right liver, corresponding  to the location of the abnormal hypermetabolism seen on the previous PET-CT. Imaging features compatible with the patient's known neoplasm and there is associated mild intrahepatic biliary duct dilatation mainly in segments VII and VIII. 2. Minimal gallbladder wall thickening versus trace pericholecystic fluid. 3. No evidence for lymphadenopathy in the abdomen or pelvis. 4. Left colonic diverticulosis without diverticulitis. 5.  Aortic Atherosclerois (ICD10-170.0)   02/19/2020 Imaging   PET IMPRESSION: 1. Similar nonspecific low-level hypermetabolism within the central high right hepatic lobe, without well-defined mass in this area on the most recent diagnostic CT. 2. Hypermetabolism along the course of the common duct stent, likely physiologic/reactive. 3. No findings of hypermetabolic extrahepatic metastatic disease. 4. Possible developing intrahepatic duct dilatation, suboptimally evaluated. Correlate with bilirubin levels and if these are elevated, consider dedicated contrast enhanced CT.     I have reviewed the past medical history, past surgical history, social history and family history with the patient and they are unchanged from previous note.  ALLERGIES:  is allergic to sulfonamide derivatives.  MEDICATIONS:  Current Outpatient Medications  Medication Sig Dispense Refill  . allopurinol (ZYLOPRIM) 100 MG tablet TAKE ONE TABLET BY MOUTH ON MONDAY, WEDNESDAY, AND FRIDAY (Patient taking differently: Take 100 mg by mouth every Monday, Wednesday, and Friday. In  the morning.) 36 tablet 3  . aspirin 325 MG tablet Take 325 mg by mouth daily.      . betamethasone dipropionate (DIPROLENE) 0.05 % cream Apply 1 application topically daily as needed (skin irritation.).     Marland Kitchen ferrous sulfate 325 (65 FE) MG EC tablet Take 325 mg by mouth daily with breakfast.    . finasteride (PROSCAR) 5 MG tablet TAKE ONE TABLET BY MOUTH EVERY DAY 30 tablet 0  . Lancets (ONETOUCH ULTRASOFT) lancets Use to check blood sugar once daily 100 each 12  . levothyroxine (SYNTHROID) 25 MCG tablet Take 1 tablet (25 mcg total) by mouth daily before breakfast. 90 tablet 1  . lisinopril (ZESTRIL) 5 MG tablet TAKE ONE TABLET BY MOUTH DAILY (Patient taking differently: Take 5 mg by mouth daily. ) 90 tablet 1  . metFORMIN (GLUCOPHAGE-XR) 500 MG 24 hr tablet TAKE ONE TABLET BY MOUTH TWICE DAILY WITH MEALS (Patient taking differently: Take 500 mg by mouth 2 (two) times daily with a meal. ) 180 tablet 1  . Multiple Vitamin (MULTIVITAMIN WITH MINERALS) TABS tablet Take 1 tablet by mouth at bedtime.    Marland Kitchen omeprazole (PRILOSEC) 40 MG capsule TAKE ONE CAPSULE BY MOUTH EVERY DAY 30 capsule 3  . rosuvastatin (CRESTOR) 10 MG tablet TAKE ONE TABLET BY MOUTH EVERY DAY 30 tablet 0  . tamsulosin (FLOMAX) 0.4 MG CAPS capsule TAKE ONE CAPSULE BY MOUTH DAILY 90 capsule 3  . Trospium Chloride 60 MG CP24 TAKE ONE CAPSULE BY MOUTH EVERY DAY 90 capsule 1   No current facility-administered medications for this visit.    PHYSICAL EXAMINATION: ECOG PERFORMANCE STATUS: 2-3  Today's Vitals   02/26/20 0959 02/26/20 1000  BP: 119/60   Pulse: 69   Resp: 20   Temp: (!) 97.5 F (36.4 C)   TempSrc: Temporal   SpO2: 100%   Weight: 149 lb (67.6 kg)   Height: 5' 5" (1.651 m)   PainSc: 0-No pain 0-No pain   Body mass index is 24.79 kg/m.  Filed Weights   02/26/20 0959  Weight: 149 lb (67.6 kg)    GENERAL: alert, no distress and comfortable SKIN: skin color, texture, turgor are normal, no rashes or significant  lesions EYES: conjunctiva are pink and non-injected, sclera clear OROPHARYNX: no exudate, no erythema; lips, buccal mucosa, and tongue normal  NECK: supple, non-tender LYMPH:  no palpable lymphadenopathy in the cervical LUNGS: clear to auscultation with normal breathing effort HEART: regular rate & rhythm and no murmurs and no lower extremity edema ABDOMEN: soft, non-tender, non-distended, normal bowel sounds PSYCH: alert & oriented to person and place only, slowed speech  LABORATORY DATA:  I have reviewed the data as listed    Component Value Date/Time   NA 141 02/26/2020 0934   K 4.7 02/26/2020 0934   CL 105 02/26/2020 0934   CO2 30 02/26/2020 0934   GLUCOSE 170 (H) 02/26/2020 0934   BUN 16 02/26/2020 0934   CREATININE 1.03 02/26/2020 0934   CREATININE 1.03 02/08/2017 1631   CALCIUM 9.2 02/26/2020 0934   PROT 6.2 (L) 02/26/2020 0934   ALBUMIN 3.8 02/26/2020 0934   AST 28 02/26/2020 0934   ALT 27 02/26/2020 0934   ALKPHOS 187 (H) 02/26/2020 0934   BILITOT 0.5 02/26/2020 0934   GFRNONAA >60 02/26/2020 0934   GFRAA >60 02/26/2020 0934    No results found for: SPEP, UPEP  Lab Results  Component Value Date   WBC 5.0 02/26/2020   NEUTROABS 3.4 02/26/2020   HGB 12.7 (L) 02/26/2020   HCT 38.0 (L) 02/26/2020   MCV 105.0 (H) 02/26/2020   PLT 189 02/26/2020      Chemistry      Component Value Date/Time   NA 141 02/26/2020 0934   K 4.7 02/26/2020 0934   CL 105 02/26/2020 0934   CO2 30 02/26/2020 0934   BUN 16 02/26/2020 0934   CREATININE 1.03 02/26/2020 0934   CREATININE 1.03 02/08/2017 1631      Component Value Date/Time   CALCIUM 9.2 02/26/2020 0934   ALKPHOS 187 (H) 02/26/2020 0934   AST 28 02/26/2020 0934   ALT 27 02/26/2020 0934   BILITOT 0.5 02/26/2020 0934       RADIOGRAPHIC STUDIES: I have personally reviewed the radiological images as listed below and agreed with the findings in the report. NM PET Image Restag (PS) Skull Base To Thigh  Result Date:  02/19/2020 CLINICAL DATA:  Subsequent treatment strategy for cholangiocarcinoma, status post SBRT. Evaluate response to therapy. EXAM: NUCLEAR MEDICINE PET SKULL BASE TO THIGH TECHNIQUE: 7.9 mCi F-18 FDG was injected intravenously. Full-ring PET imaging was performed from the skull base to thigh after the radiotracer. CT data was obtained and used for attenuation correction and anatomic localization. Fasting blood glucose: 111 mg/dl COMPARISON:  07/09/2019 abdominal CT. Most recent PET of 05/18/2019. FINDINGS: Mediastinal blood pool activity: SUV max 2.9 Liver activity: SUV max NA NECK: No areas of abnormal hypermetabolism. Incidental CT findings: Cerebral atrophy.  No cervical adenopathy. CHEST: No pulmonary parenchymal or thoracic nodal hypermetabolism. Incidental CT findings: Aortic atherosclerosis. Median sternotomy for CABG. Left hemidiaphragm elevation. ABDOMEN/PELVIS: Vague low-level hypermetabolism is again identified within the central high right hepatic lobe. This measures a S.U.V. max of 5.1, including on image 101/4. Compare a S.U.V. max of 5.7 on the prior exam. No correlate well-defined mass on the most recent diagnostic CT. Persistent capsular retraction. There is also hypermetabolism along the course of the common duct stent, which was placed subsequent to the prior PET. Examples within the hepatic hilum at a S.U.V. max of 5.9 and within the pancreatic head at a S.U.V. max of 6.0. No abdominal nodal hypermetabolism. Incidental CT findings: Diffuse hypoattenuation throughout the central liver  is new or increased since the prior PET, likely radiation induced. Possible developing intrahepatic duct dilatation most apparent within the lateral segment left liver lobe on 104/4. Abdominal aortic atherosclerosis. Extensive colonic diverticulosis. SKELETON: No abnormal marrow activity. Incidental CT findings: none IMPRESSION: 1. Similar nonspecific low-level hypermetabolism within the central high right hepatic  lobe, without well-defined mass in this area on the most recent diagnostic CT. 2. Hypermetabolism along the course of the common duct stent, likely physiologic/reactive. 3. No findings of hypermetabolic extrahepatic metastatic disease. 4. Possible developing intrahepatic duct dilatation, suboptimally evaluated. Correlate with bilirubin levels and if these are elevated, consider dedicated contrast enhanced CT. Electronically Signed   By: Abigail Miyamoto M.D.   On: 02/19/2020 11:14

## 2020-02-26 NOTE — Telephone Encounter (Signed)
Refills sent. Patient's wife advised.

## 2020-02-26 NOTE — Telephone Encounter (Signed)
Patient's wife requests  omeprazole (PRILOSEC) 40 MG capsule  finasteride (PROSCAR) 5 MG tablet    rosuvastatin (CRESTOR) 10 MG tablet   90 day supply to be sent to [facility]

## 2020-02-27 LAB — CANCER ANTIGEN 19-9: CA 19-9: 37 U/mL — ABNORMAL HIGH (ref 0–35)

## 2020-03-01 ENCOUNTER — Other Ambulatory Visit: Payer: Self-pay | Admitting: Family Medicine

## 2020-03-02 ENCOUNTER — Other Ambulatory Visit: Payer: Self-pay

## 2020-03-02 ENCOUNTER — Ambulatory Visit (INDEPENDENT_AMBULATORY_CARE_PROVIDER_SITE_OTHER): Payer: Medicare Other | Admitting: Family Medicine

## 2020-03-02 DIAGNOSIS — C221 Intrahepatic bile duct carcinoma: Secondary | ICD-10-CM

## 2020-03-02 DIAGNOSIS — E119 Type 2 diabetes mellitus without complications: Secondary | ICD-10-CM | POA: Diagnosis not present

## 2020-03-02 DIAGNOSIS — E7800 Pure hypercholesterolemia, unspecified: Secondary | ICD-10-CM

## 2020-03-02 DIAGNOSIS — E039 Hypothyroidism, unspecified: Secondary | ICD-10-CM | POA: Diagnosis not present

## 2020-03-02 LAB — COMPREHENSIVE METABOLIC PANEL
ALT: 19 U/L (ref 0–53)
AST: 18 U/L (ref 0–37)
Albumin: 3.6 g/dL (ref 3.5–5.2)
Alkaline Phosphatase: 178 U/L — ABNORMAL HIGH (ref 39–117)
BUN: 16 mg/dL (ref 6–23)
CO2: 27 mEq/L (ref 19–32)
Calcium: 8.6 mg/dL (ref 8.4–10.5)
Chloride: 103 mEq/L (ref 96–112)
Creatinine, Ser: 0.97 mg/dL (ref 0.40–1.50)
GFR: 73.88 mL/min (ref >60.00)
Glucose, Bld: 131 mg/dL — ABNORMAL HIGH (ref 70–99)
Potassium: 4 mEq/L (ref 3.5–5.1)
Sodium: 138 mEq/L (ref 135–145)
Total Bilirubin: 0.5 mg/dL (ref 0.2–1.2)
Total Protein: 6 g/dL (ref 6.0–8.3)

## 2020-03-02 LAB — HEMOGLOBIN A1C: Hgb A1c MFr Bld: 6 % (ref 4.6–6.5)

## 2020-03-02 LAB — TSH: TSH: 1.83 u[IU]/mL (ref 0.35–4.50)

## 2020-03-05 ENCOUNTER — Other Ambulatory Visit: Payer: Self-pay | Admitting: Family Medicine

## 2020-03-14 ENCOUNTER — Other Ambulatory Visit: Payer: Self-pay | Admitting: Family Medicine

## 2020-03-18 ENCOUNTER — Telehealth: Payer: Self-pay

## 2020-03-18 NOTE — Telephone Encounter (Signed)
-----   Message from Timothy Lasso, RN sent at 11/19/2019  8:58 AM EST -----  ----- Message ----- From: Irving Copas., MD Sent: 11/18/2019   5:28 PM EST To: [place], This patient will need a follow-up ERCP with epic biliary stents to be done in approximately 4 months.  When he gets closer to that time.  Please reach out to me so I can tell you what type of stents that we will need to we can order them. Thanks. GM

## 2020-03-21 ENCOUNTER — Other Ambulatory Visit: Payer: Self-pay

## 2020-03-21 DIAGNOSIS — R160 Hepatomegaly, not elsewhere classified: Secondary | ICD-10-CM

## 2020-03-21 DIAGNOSIS — R7989 Other specified abnormal findings of blood chemistry: Secondary | ICD-10-CM

## 2020-03-21 DIAGNOSIS — C221 Intrahepatic bile duct carcinoma: Secondary | ICD-10-CM

## 2020-03-21 DIAGNOSIS — K8309 Other cholangitis: Secondary | ICD-10-CM

## 2020-03-21 NOTE — Telephone Encounter (Signed)
The pt has been scheduled for an ERCP on 6/2/21at 9 am at [facility] with Dr Rush Landmark.  Covid test on 5/29 at 1015 am.  ERCP scheduled, pt instructed and medications reviewed. Patient instructions available in My Chart.  Patient to call with any questions or concerns. The pt wife confirmed all information understanding.

## 2020-03-23 ENCOUNTER — Telehealth: Payer: Self-pay | Admitting: Gastroenterology

## 2020-03-23 NOTE — Telephone Encounter (Signed)
Spoke with Patients wife who states that patient had a sharp pain mid back that lasted a few minutes this afternoon. She put a heating pad to the area and the pain resolved. No fever,but has intermittent nausea no vomiting. He is able to eat and drink without difficultly. Patient has ERCP scheduled 04/20/20. She was advised to call the office if this happens again and cannot get relief. She voiced understanding.

## 2020-03-23 NOTE — Telephone Encounter (Signed)
Patient's wife called states the patient is experiencing a sharp pain on upper pain not sure if it has to do with any of his procedures.

## 2020-03-24 ENCOUNTER — Encounter (HOSPITAL_COMMUNITY): Payer: Self-pay | Admitting: Emergency Medicine

## 2020-03-24 ENCOUNTER — Other Ambulatory Visit: Payer: Self-pay

## 2020-03-24 ENCOUNTER — Inpatient Hospital Stay (HOSPITAL_COMMUNITY)
Admission: EM | Admit: 2020-03-24 | Discharge: 2020-04-19 | DRG: 435 | Disposition: E | Payer: Medicare Other | Attending: Internal Medicine | Admitting: Internal Medicine

## 2020-03-24 ENCOUNTER — Emergency Department (HOSPITAL_COMMUNITY): Payer: Medicare Other

## 2020-03-24 DIAGNOSIS — Z951 Presence of aortocoronary bypass graft: Secondary | ICD-10-CM | POA: Diagnosis not present

## 2020-03-24 DIAGNOSIS — F0280 Dementia in other diseases classified elsewhere without behavioral disturbance: Secondary | ICD-10-CM | POA: Diagnosis not present

## 2020-03-24 DIAGNOSIS — I10 Essential (primary) hypertension: Secondary | ICD-10-CM | POA: Diagnosis present

## 2020-03-24 DIAGNOSIS — I248 Other forms of acute ischemic heart disease: Secondary | ICD-10-CM | POA: Diagnosis present

## 2020-03-24 DIAGNOSIS — G309 Alzheimer's disease, unspecified: Secondary | ICD-10-CM | POA: Diagnosis present

## 2020-03-24 DIAGNOSIS — K219 Gastro-esophageal reflux disease without esophagitis: Secondary | ICD-10-CM | POA: Diagnosis present

## 2020-03-24 DIAGNOSIS — E119 Type 2 diabetes mellitus without complications: Secondary | ICD-10-CM | POA: Diagnosis present

## 2020-03-24 DIAGNOSIS — Z809 Family history of malignant neoplasm, unspecified: Secondary | ICD-10-CM

## 2020-03-24 DIAGNOSIS — E785 Hyperlipidemia, unspecified: Secondary | ICD-10-CM | POA: Diagnosis present

## 2020-03-24 DIAGNOSIS — Z7984 Long term (current) use of oral hypoglycemic drugs: Secondary | ICD-10-CM

## 2020-03-24 DIAGNOSIS — Z9889 Other specified postprocedural states: Secondary | ICD-10-CM

## 2020-03-24 DIAGNOSIS — K922 Gastrointestinal hemorrhage, unspecified: Secondary | ICD-10-CM | POA: Diagnosis not present

## 2020-03-24 DIAGNOSIS — T85520A Displacement of bile duct prosthesis, initial encounter: Secondary | ICD-10-CM | POA: Diagnosis not present

## 2020-03-24 DIAGNOSIS — K920 Hematemesis: Secondary | ICD-10-CM | POA: Diagnosis not present

## 2020-03-24 DIAGNOSIS — R7989 Other specified abnormal findings of blood chemistry: Secondary | ICD-10-CM | POA: Diagnosis not present

## 2020-03-24 DIAGNOSIS — Z79899 Other long term (current) drug therapy: Secondary | ICD-10-CM

## 2020-03-24 DIAGNOSIS — Z86010 Personal history of colonic polyps: Secondary | ICD-10-CM | POA: Diagnosis not present

## 2020-03-24 DIAGNOSIS — M5489 Other dorsalgia: Secondary | ICD-10-CM | POA: Diagnosis not present

## 2020-03-24 DIAGNOSIS — K838 Other specified diseases of biliary tract: Secondary | ICD-10-CM | POA: Diagnosis not present

## 2020-03-24 DIAGNOSIS — Z8711 Personal history of peptic ulcer disease: Secondary | ICD-10-CM | POA: Diagnosis not present

## 2020-03-24 DIAGNOSIS — K5750 Diverticulosis of both small and large intestine without perforation or abscess without bleeding: Secondary | ICD-10-CM | POA: Diagnosis present

## 2020-03-24 DIAGNOSIS — N179 Acute kidney failure, unspecified: Secondary | ICD-10-CM | POA: Diagnosis present

## 2020-03-24 DIAGNOSIS — Z8505 Personal history of malignant neoplasm of liver: Secondary | ICD-10-CM | POA: Diagnosis not present

## 2020-03-24 DIAGNOSIS — I2510 Atherosclerotic heart disease of native coronary artery without angina pectoris: Secondary | ICD-10-CM | POA: Diagnosis present

## 2020-03-24 DIAGNOSIS — M109 Gout, unspecified: Secondary | ICD-10-CM | POA: Diagnosis not present

## 2020-03-24 DIAGNOSIS — N400 Enlarged prostate without lower urinary tract symptoms: Secondary | ICD-10-CM | POA: Diagnosis present

## 2020-03-24 DIAGNOSIS — K831 Obstruction of bile duct: Secondary | ICD-10-CM | POA: Diagnosis not present

## 2020-03-24 DIAGNOSIS — M19011 Primary osteoarthritis, right shoulder: Secondary | ICD-10-CM | POA: Diagnosis present

## 2020-03-24 DIAGNOSIS — R571 Hypovolemic shock: Secondary | ICD-10-CM | POA: Diagnosis present

## 2020-03-24 DIAGNOSIS — Z20822 Contact with and (suspected) exposure to covid-19: Secondary | ICD-10-CM | POA: Diagnosis present

## 2020-03-24 DIAGNOSIS — R0902 Hypoxemia: Secondary | ICD-10-CM | POA: Diagnosis not present

## 2020-03-24 DIAGNOSIS — R112 Nausea with vomiting, unspecified: Secondary | ICD-10-CM | POA: Diagnosis present

## 2020-03-24 DIAGNOSIS — Z66 Do not resuscitate: Secondary | ICD-10-CM | POA: Diagnosis not present

## 2020-03-24 DIAGNOSIS — M546 Pain in thoracic spine: Secondary | ICD-10-CM | POA: Diagnosis present

## 2020-03-24 DIAGNOSIS — I77819 Aortic ectasia, unspecified site: Secondary | ICD-10-CM | POA: Diagnosis present

## 2020-03-24 DIAGNOSIS — Z87891 Personal history of nicotine dependence: Secondary | ICD-10-CM

## 2020-03-24 DIAGNOSIS — K7581 Nonalcoholic steatohepatitis (NASH): Secondary | ICD-10-CM | POA: Diagnosis present

## 2020-03-24 DIAGNOSIS — N138 Other obstructive and reflux uropathy: Secondary | ICD-10-CM | POA: Diagnosis present

## 2020-03-24 DIAGNOSIS — Z7982 Long term (current) use of aspirin: Secondary | ICD-10-CM

## 2020-03-24 DIAGNOSIS — Z79890 Hormone replacement therapy (postmenopausal): Secondary | ICD-10-CM

## 2020-03-24 DIAGNOSIS — D62 Acute posthemorrhagic anemia: Secondary | ICD-10-CM | POA: Diagnosis present

## 2020-03-24 DIAGNOSIS — Z515 Encounter for palliative care: Secondary | ICD-10-CM | POA: Diagnosis not present

## 2020-03-24 DIAGNOSIS — E1169 Type 2 diabetes mellitus with other specified complication: Secondary | ICD-10-CM | POA: Diagnosis not present

## 2020-03-24 DIAGNOSIS — J9601 Acute respiratory failure with hypoxia: Secondary | ICD-10-CM | POA: Diagnosis not present

## 2020-03-24 DIAGNOSIS — K828 Other specified diseases of gallbladder: Secondary | ICD-10-CM | POA: Diagnosis not present

## 2020-03-24 DIAGNOSIS — I959 Hypotension, unspecified: Secondary | ICD-10-CM | POA: Diagnosis present

## 2020-03-24 DIAGNOSIS — E039 Hypothyroidism, unspecified: Secondary | ICD-10-CM | POA: Diagnosis present

## 2020-03-24 DIAGNOSIS — D539 Nutritional anemia, unspecified: Secondary | ICD-10-CM | POA: Diagnosis not present

## 2020-03-24 DIAGNOSIS — R000 Tachycardia, unspecified: Secondary | ICD-10-CM | POA: Diagnosis not present

## 2020-03-24 DIAGNOSIS — I491 Atrial premature depolarization: Secondary | ICD-10-CM | POA: Diagnosis not present

## 2020-03-24 DIAGNOSIS — R160 Hepatomegaly, not elsewhere classified: Secondary | ICD-10-CM

## 2020-03-24 DIAGNOSIS — Z03818 Encounter for observation for suspected exposure to other biological agents ruled out: Secondary | ICD-10-CM | POA: Diagnosis not present

## 2020-03-24 DIAGNOSIS — R52 Pain, unspecified: Secondary | ICD-10-CM | POA: Diagnosis not present

## 2020-03-24 DIAGNOSIS — C221 Intrahepatic bile duct carcinoma: Secondary | ICD-10-CM | POA: Diagnosis present

## 2020-03-24 DIAGNOSIS — E7800 Pure hypercholesterolemia, unspecified: Secondary | ICD-10-CM | POA: Diagnosis not present

## 2020-03-24 DIAGNOSIS — Z4659 Encounter for fitting and adjustment of other gastrointestinal appliance and device: Secondary | ICD-10-CM | POA: Diagnosis not present

## 2020-03-24 DIAGNOSIS — K8309 Other cholangitis: Secondary | ICD-10-CM

## 2020-03-24 LAB — POC OCCULT BLOOD, ED: Fecal Occult Bld: NEGATIVE

## 2020-03-24 LAB — COMPREHENSIVE METABOLIC PANEL
ALT: 624 U/L — ABNORMAL HIGH (ref 0–44)
AST: 978 U/L — ABNORMAL HIGH (ref 15–41)
Albumin: 2.8 g/dL — ABNORMAL LOW (ref 3.5–5.0)
Alkaline Phosphatase: 275 U/L — ABNORMAL HIGH (ref 38–126)
Anion gap: 12 (ref 5–15)
BUN: 33 mg/dL — ABNORMAL HIGH (ref 8–23)
CO2: 19 mmol/L — ABNORMAL LOW (ref 22–32)
Calcium: 8 mg/dL — ABNORMAL LOW (ref 8.9–10.3)
Chloride: 111 mmol/L (ref 98–111)
Creatinine, Ser: 1.34 mg/dL — ABNORMAL HIGH (ref 0.61–1.24)
GFR calc Af Amer: 56 mL/min — ABNORMAL LOW (ref >60)
GFR calc non Af Amer: 49 mL/min — ABNORMAL LOW (ref >60)
Glucose, Bld: 149 mg/dL — ABNORMAL HIGH (ref 70–99)
Potassium: 4 mmol/L (ref 3.5–5.1)
Sodium: 142 mmol/L (ref 135–145)
Total Bilirubin: 2.7 mg/dL — ABNORMAL HIGH (ref 0.3–1.2)
Total Protein: 5.5 g/dL — ABNORMAL LOW (ref 6.5–8.1)

## 2020-03-24 LAB — DIFFERENTIAL
Abs Immature Granulocytes: 0.03 10*3/uL (ref 0.00–0.07)
Basophils Absolute: 0 10*3/uL (ref 0.0–0.1)
Basophils Relative: 0 %
Eosinophils Absolute: 0 10*3/uL (ref 0.0–0.5)
Eosinophils Relative: 0 %
Immature Granulocytes: 0 %
Lymphocytes Relative: 3 %
Lymphs Abs: 0.2 10*3/uL — ABNORMAL LOW (ref 0.7–4.0)
Monocytes Absolute: 0.1 10*3/uL (ref 0.1–1.0)
Monocytes Relative: 1 %
Neutro Abs: 7.6 10*3/uL (ref 1.7–7.7)
Neutrophils Relative %: 96 %

## 2020-03-24 LAB — TROPONIN I (HIGH SENSITIVITY)
Troponin I (High Sensitivity): 11 ng/L (ref <18)
Troponin I (High Sensitivity): 21 ng/L — ABNORMAL HIGH (ref <18)

## 2020-03-24 LAB — RESPIRATORY PANEL BY RT PCR (FLU A&B, COVID)
Influenza A by PCR: NEGATIVE
Influenza B by PCR: NEGATIVE
SARS Coronavirus 2 by RT PCR: NEGATIVE

## 2020-03-24 LAB — CBC
HCT: 32.1 % — ABNORMAL LOW (ref 39.0–52.0)
Hemoglobin: 10.4 g/dL — ABNORMAL LOW (ref 13.0–17.0)
MCH: 34.8 pg — ABNORMAL HIGH (ref 26.0–34.0)
MCHC: 32.4 g/dL (ref 30.0–36.0)
MCV: 107.4 fL — ABNORMAL HIGH (ref 80.0–100.0)
Platelets: 160 10*3/uL (ref 150–400)
RBC: 2.99 MIL/uL — ABNORMAL LOW (ref 4.22–5.81)
RDW: 14 % (ref 11.5–15.5)
WBC: 8 10*3/uL (ref 4.0–10.5)
nRBC: 0 % (ref 0.0–0.2)

## 2020-03-24 LAB — URINALYSIS, ROUTINE W REFLEX MICROSCOPIC
Bilirubin Urine: NEGATIVE
Glucose, UA: NEGATIVE mg/dL
Hgb urine dipstick: NEGATIVE
Ketones, ur: NEGATIVE mg/dL
Leukocytes,Ua: NEGATIVE
Nitrite: NEGATIVE
Protein, ur: NEGATIVE mg/dL
Specific Gravity, Urine: >1.046 — ABNORMAL HIGH (ref 1.005–1.030)
pH: 5 (ref 5.0–8.0)

## 2020-03-24 LAB — HEMOGLOBIN AND HEMATOCRIT, BLOOD
HCT: 27.9 % — ABNORMAL LOW (ref 39.0–52.0)
HCT: 29.6 % — ABNORMAL LOW (ref 39.0–52.0)
Hemoglobin: 9.2 g/dL — ABNORMAL LOW (ref 13.0–17.0)
Hemoglobin: 9.4 g/dL — ABNORMAL LOW (ref 13.0–17.0)

## 2020-03-24 LAB — PROTIME-INR
INR: 1.2 (ref 0.8–1.2)
Prothrombin Time: 15 seconds (ref 11.4–15.2)

## 2020-03-24 LAB — LIPASE, BLOOD: Lipase: 70 U/L — ABNORMAL HIGH (ref 11–51)

## 2020-03-24 LAB — AMMONIA: Ammonia: 18 umol/L (ref 9–35)

## 2020-03-24 LAB — VITAMIN B12: Vitamin B-12: 1016 pg/mL — ABNORMAL HIGH (ref 180–914)

## 2020-03-24 MED ORDER — ONDANSETRON HCL 4 MG/2ML IJ SOLN: 4.0000 mg | Freq: Four times a day (QID) | INTRAMUSCULAR | Status: DC | PRN

## 2020-03-24 MED ORDER — IOHEXOL 300 MG/ML  SOLN
100.0000 mL | Freq: Once | INTRAMUSCULAR | Status: AC | PRN
  Administered 2020-03-24: 05:00:00 100 mL via INTRAVENOUS

## 2020-03-24 MED ORDER — SODIUM CHLORIDE 0.9 % IV BOLUS
250.0000 mL | Freq: Once | INTRAVENOUS | Status: AC
  Administered 2020-03-24: 250 mL via INTRAVENOUS

## 2020-03-24 MED ORDER — ALLOPURINOL 100 MG PO TABS
100.0000 mg | ORAL_TABLET | ORAL | Status: DC
  Filled 2020-03-24: qty 1

## 2020-03-24 MED ORDER — PANTOPRAZOLE SODIUM 40 MG IV SOLR
40.0000 mg | Freq: Two times a day (BID) | INTRAVENOUS | Status: DC
  Administered 2020-03-24 (×2): 40 mg via INTRAVENOUS
  Filled 2020-03-24 (×2): qty 40

## 2020-03-24 MED ORDER — TROSPIUM CHLORIDE ER 60 MG PO CP24: 60.0000 mg | ORAL_CAPSULE | Freq: Every day | ORAL | Status: DC

## 2020-03-24 MED ORDER — ONDANSETRON HCL 4 MG PO TABS: 4.0000 mg | ORAL_TABLET | Freq: Four times a day (QID) | ORAL | Status: DC | PRN

## 2020-03-24 MED ORDER — FINASTERIDE 5 MG PO TABS
5.0000 mg | ORAL_TABLET | Freq: Every day | ORAL | Status: DC
  Administered 2020-03-24: 5 mg via ORAL
  Filled 2020-03-24 (×2): qty 1

## 2020-03-24 MED ORDER — METRONIDAZOLE IN NACL 5-0.79 MG/ML-% IV SOLN
500.0000 mg | Freq: Three times a day (TID) | INTRAVENOUS | Status: DC
  Administered 2020-03-24 – 2020-03-25 (×3): 500 mg via INTRAVENOUS
  Filled 2020-03-24 (×3): qty 100

## 2020-03-24 MED ORDER — TRIAMCINOLONE ACETONIDE 0.5 % EX OINT
TOPICAL_OINTMENT | Freq: Every day | CUTANEOUS | Status: DC | PRN
  Filled 2020-03-24: qty 15

## 2020-03-24 MED ORDER — CIPROFLOXACIN IN D5W 400 MG/200ML IV SOLN
400.0000 mg | Freq: Once | INTRAVENOUS | Status: AC
  Administered 2020-03-24: 400 mg via INTRAVENOUS
  Filled 2020-03-24: qty 200

## 2020-03-24 MED ORDER — ALBUTEROL SULFATE (2.5 MG/3ML) 0.083% IN NEBU: 2.5000 mg | INHALATION_SOLUTION | Freq: Four times a day (QID) | RESPIRATORY_TRACT | Status: DC | PRN

## 2020-03-24 MED ORDER — INSULIN ASPART 100 UNIT/ML ~~LOC~~ SOLN
0.0000 [IU] | Freq: Three times a day (TID) | SUBCUTANEOUS | Status: DC
  Administered 2020-03-25: 3 [IU] via SUBCUTANEOUS

## 2020-03-24 MED ORDER — SODIUM CHLORIDE 0.9 % IV SOLN: INTRAVENOUS | Status: DC

## 2020-03-24 MED ORDER — DARIFENACIN HYDROBROMIDE ER 7.5 MG PO TB24
7.5000 mg | ORAL_TABLET | Freq: Every day | ORAL | Status: DC
  Filled 2020-03-24: qty 1

## 2020-03-24 MED ORDER — CIPROFLOXACIN IN D5W 400 MG/200ML IV SOLN
400.0000 mg | Freq: Two times a day (BID) | INTRAVENOUS | Status: DC
  Administered 2020-03-24 – 2020-03-25 (×2): 400 mg via INTRAVENOUS
  Filled 2020-03-24 (×4): qty 200

## 2020-03-24 MED ORDER — LEVOTHYROXINE SODIUM 25 MCG PO TABS
25.0000 ug | ORAL_TABLET | Freq: Every day | ORAL | Status: DC
  Administered 2020-03-25: 25 ug via ORAL
  Filled 2020-03-24: qty 1

## 2020-03-24 MED ORDER — SODIUM CHLORIDE 0.9% FLUSH
3.0000 mL | Freq: Two times a day (BID) | INTRAVENOUS | Status: DC
  Administered 2020-03-24 (×2): 3 mL via INTRAVENOUS

## 2020-03-24 MED ORDER — ENOXAPARIN SODIUM 40 MG/0.4ML ~~LOC~~ SOLN: 40.0000 mg | SUBCUTANEOUS | Status: DC

## 2020-03-24 NOTE — Consult Note (Addendum)
[facility] Consult: 8:59 AM 04/03/2020  LOS: 0 days    Referring Provider: Dr. Hal Hope Primary Care Physician:  Ricardo Sou, MD Primary Gastroenterologist:  Dr. Lyndel Washington    Reason for Consultation: Back pain, elevated LFTs, previous biliary stents for cholangiocarcinoma.   HPI: Ricardo Washington is a 85 y.o. male.  Hx Alzheimer's dementia.  PO Patient with central hilar hepatic mass and right biliary dilatation dating to 2019. Initially asymptomatic and managed conservatively. Admission in 06/2019 for E. coli/Enterobacter biliary sepsis, cholangitis 06/26/2019 ERCP: Indeterminate stricture in R hepatic duct, brushed. Choledocholithiasis, sludge, hemobilia removed with balloon sweep following biliary sphincterotomy and balloon sphincteroplasty.  Tissue from main biliary tree suctioned and submitted for histology.  10 Fr 12 cm plastic stent placed to right hepatic duct.  Also noted: J-shaped gastric deformity, duodenal diverticulum, normal major papilla. Path: Advanced glandular dysplasia, microscopic foci of invasive adenocarcinoma Not a candidate for IR directed therapy, ablation/embolization, chemotherapy. 11/18/19 ERCP: Open biliary sphincterotomy. Stent had migrated slightly into the duodenum. 2 severe strictures in hepatic duct system looked malignant and encompassed CHD bifurcation. Left and right main hepatic ducts moderately dilated. MD dilated the hepatic duct bifurcation, left main and left right hepatic ducts as well as common bile duct. Sludge and some clot of blood removed. Plastic stent placed into the right and left hepatic ducts.  Set up for surveillance ERCP 6/2  Intermittent mid back pain x 2 weeks.  Appetite good.  Yesterday pain was intense.  No real abdominal pain.  Ate  lunch and later in  the evening vomited nonbloody material but then had a couple of episodes of dark, coffee like emesis which was thick and very tenacious.  Wife says it took a lot of scrubbing to clean up the sink where he had vomited.  No melena, no bloody stools.  No melena or bloody stool. Transported to ED. Has received Cipro along with judicious IV fluid boluses for low blood pressures.  No fever  Hgb 10.4, was ~ 12.5 in fall 2020.  MCV 107.  Normal platelets, INR Weight T bili 2.7, alkaline phosphatase 275, AST/ALT 978/624. Lipase 70. Albumin 2.8. AKI at 33/1.3, previously normal renal function. COVID-19 negative. FOBT negative. CTAP with contrast: Dilated intrahepatic ducts, gallbladder sludge. Vague enhancement in central liver.  Currently the patient tells me he is hungry and denies nausea, denies abdominal pain.    Past Medical History:  Diagnosis Date  . BPH with obstruction/lower urinary tract symptoms 10/27/2015   Dr. Karsten Ro  . Cataract   . Cholangiocarcinoma (Ricardo Washington) 2020   Mass detected 04/2018.  Intrahepatic cholangiocarcinoma of R hepatic lobe; stage 1A, cT1aN0M0->not candidate for intervention or chemo, Dr. Mammie Lorenzo finished 08/2019.  Biliary stent temporary->to be replaced by metal stent 10/2019.  Marland Kitchen Colon wall thickening 04/2018   "mass-like" per radiologist interpretation; colonoscopy as next step---f/u colonoscopy showed 'tics but o/w normal.  NO further colonoscopies needed due to age.  . Coronary artery disease    with preserved LV function.  . Dementia ([place])   . Diabetes mellitus without  complication ([place])   . Diverticulosis of colon    severe, entire colon  . Dysphagia 11/2018   Barium swallow: mild esophageal dysmotility.  . Ectatic abdominal aorta ([place]) 01/2017   Abd u/s: 2.9 cm aortic ectasia--at risk for aneurism development.  Recheck aortic u/s 5 yrs.  . Erectile dysfunction due to arterial insufficiency   . Fatigue   . Gout    always 2nd toe L foot (uric acid 6.20 Dec 2014  per old records)  . History of adenomatous polyp of colon 2002  . History of stomach ulcers   . Hyperlipidemia   . Hypertension   . Hypogonadism male   . Hypothyroidism 09/2019   started low dose synthroid 10/05/19->TSH normalized 11/2019  . Klebsiella sepsis ([place]) 01/2017   due to acute biliary tract infection (no stones) and acute diverticulitis.  . Macrocytic anemia 01/2017   vit B12 borderline low and iron borderline low: checking hemoccults and starting vit B12 PO and iron PO as of 02/11/17.  Vit B12 and folate normal as of GI f/u 06/2018.  . Myogenic ptosis of bilateral eyelids 2018   Plastic surgery in [place], [place] to do surg as of 03/2017.  Marland Kitchen NASH (nonalcoholic steatohepatitis) 06/2017   LFTs up, abd u/s showed fatty liver but no other abnormality.  . Osteoarthritis of right shoulder 09/2018   Near end-stage --->glenohumeral joint-->intra-articular steroid injection 09/2018 (Dr. Delilah Shan).  11/2018-->end stage, but not ready for total shoulder replacement.  . Past use of tobacco    quit 1984  . Rectal bleeding   . Rosacea   . S/P coronary artery bypass graft x 3   . Urine incontinence    Dr. Karsten Ro    Past Surgical History:  Procedure Laterality Date  . BILIARY BRUSHING  06/26/2019   PATH: invasive adenocarcinoma.  Procedure: BILIARY BRUSHING;  Surgeon: Rush Landmark Telford Nab., MD;  Location: [facility];  Service: Gastroenterology;;  . BILIARY DILATION  06/26/2019   Procedure: BILIARY DILATION;  Surgeon: Irving Copas., MD;  Location: [place];  Service: Gastroenterology;;  . BILIARY DILATION  11/18/2019   Procedure: BILIARY DILATION;  Surgeon: Irving Copas., MD;  Location: [facility];  Service: Gastroenterology;;  . BILIARY STENT PLACEMENT  06/26/2019   Procedure: BILIARY STENT PLACEMENT;  Surgeon: Irving Copas., MD;  Location: [facility];  Service: Gastroenterology;;  . BILIARY STENT PLACEMENT N/A 11/18/2019   Procedure: BILIARY STENT PLACEMENT;   Surgeon: Irving Copas., MD;  Location: [facility];  Service: Gastroenterology;  Laterality: N/A;  . CARDIOVASCULAR STRESS TEST  08/28/2016   Low risk myoview, normal EF, no ischemia.  Marland Kitchen CATARACT EXTRACTION, BILATERAL    . COLONOSCOPY  2002, 2006, 02/25/2009; 06/11/18   Polyp x 1 2002, none 2006 or 2010.  Adenomatous polyp 06/11/18+  signif diverticulosis.  NO FURTHER COLONOSCOPIES DUE TO AGE.  Marland Kitchen CORONARY ARTERY BYPASS GRAFT  06/2009   descending,saphenous vein graft to first obtuse marginal, sequential saphenous vein graft to posterior descending and posterolateral  . Endoscopic vein harvest right thigh    . ERCP N/A 06/26/2019   Procedure: ENDOSCOPIC RETROGRADE CHOLANGIOPANCREATOGRAPHY (ERCP);  Surgeon: Irving Copas., MD;  Location: [facility];  Service: Gastroenterology;  Laterality: N/A;  with stent   . ERCP N/A 11/18/2019   Procedure: ENDOSCOPIC RETROGRADE CHOLANGIOPANCREATOGRAPHY (ERCP);  Surgeon: Irving Copas., MD;  Location: [facility];  Service: Gastroenterology;  Laterality: N/A;  . IR RADIOLOGIST EVAL & MGMT  06/12/2018  . IR RADIOLOGIST EVAL & MGMT  09/18/2018  . IR RADIOLOGIST EVAL & MGMT  05/26/2019  . IR RADIOLOGIST EVAL & MGMT  07/29/2019  . PTCA    . REMOVAL OF STONES  06/26/2019   Procedure: REMOVAL OF STONES;  Surgeon: Rush Landmark Telford Nab., MD;  Location: [facility];  Service: Gastroenterology;;  . REMOVAL OF STONES  11/18/2019   Procedure: REMOVAL OF STONES;  Surgeon: Irving Copas., MD;  Location: [facility];  Service: Gastroenterology;;  . Joan Mayans  06/26/2019   Procedure: Joan Mayans;  Surgeon: Irving Copas., MD;  Location: [facility];  Service: Gastroenterology;;  . Lavell Islam REMOVAL  11/18/2019   Procedure: STENT REMOVAL;  Surgeon: Irving Copas., MD;  Location: [facility];  Service: Gastroenterology;;  . TONSILLECTOMY    . UMBILICAL HERNIA REPAIR     2009  . VASECTOMY      Prior to Admission  medications   Medication Sig Start Date End Date Taking? Authorizing Provider  allopurinol (ZYLOPRIM) 100 MG tablet TAKE ONE TABLET BY MOUTH ON MONDAY, WEDNESDAY, AND FRIDAY Patient taking differently: Take 100 mg by mouth every Monday, Wednesday, and Friday. In the morning. 08/24/19   McGowen, Adrian Blackwater, MD  aspirin 325 MG tablet Take 325 mg by mouth daily.      [provider]  betamethasone dipropionate (DIPROLENE) 0.05 % cream Apply 1 application topically daily as needed (skin irritation.).  12/17/18   [provider]  ferrous sulfate 325 (65 FE) MG EC tablet Take 325 mg by mouth daily with breakfast.    [provider]  finasteride (PROSCAR) 5 MG tablet Take 1 tablet (5 mg total) by mouth daily. 02/26/20   McGowen, Adrian Blackwater, MD  Lancets [facility] ULTRASOFT) lancets Use to check blood sugar once daily 04/15/18   McGowen, Adrian Blackwater, MD  levothyroxine (SYNTHROID) 25 MCG tablet Take 1 tablet (25 mcg total) by mouth daily before breakfast. 11/27/19   McGowen, Adrian Blackwater, MD  lisinopril (ZESTRIL) 5 MG tablet TAKE ONE TABLET BY MOUTH EVERY DAY 03/07/20   McGowen, Adrian Blackwater, MD  metFORMIN (GLUCOPHAGE-XR) 500 MG 24 hr tablet TAKE ONE TABLET BY MOUTH TWICE DAILY WITH MEALS Patient taking differently: Take 500 mg by mouth 2 (two) times daily with a meal.  06/22/19   McGowen, Adrian Blackwater, MD  Multiple Vitamin (MULTIVITAMIN WITH MINERALS) TABS tablet Take 1 tablet by mouth at bedtime.    [provider]  omeprazole (PRILOSEC) 40 MG capsule TAKE ONE CAPSULE BY MOUTH EVERY DAY 02/26/20   McGowen, Adrian Blackwater, MD  rosuvastatin (CRESTOR) 10 MG tablet Take 1 tablet (10 mg total) by mouth daily. 02/26/20   McGowen, Adrian Blackwater, MD  tamsulosin (FLOMAX) 0.4 MG CAPS capsule TAKE ONE CAPSULE BY MOUTH DAILY 03/14/20   McGowen, Adrian Blackwater, MD  Trospium Chloride 60 MG CP24 TAKE ONE CAPSULE BY MOUTH EVERY DAY 12/17/19   McGowen, Adrian Blackwater, MD    Scheduled Meds: . enoxaparin (LOVENOX) injection  40 mg Subcutaneous  Q24H  . sodium chloride flush  3 mL Intravenous Q12H   Infusions: . sodium chloride 75 mL/hr at 04/15/2020 0834  . ciprofloxacin     PRN Meds: albuterol, ondansetron **OR** ondansetron (ZOFRAN) IV   Allergies as of 04/04/2020 - Review Complete 04/07/2020  Allergen Reaction Noted  . Sulfonamide derivatives Nausea Only 07/03/2019    Family History  Problem Relation Age of Onset  . Cancer Mother   . Cancer Father        pt points to LLQ as  area of  cancer, so potentially could have been intestinal.     . Colon cancer Neg Hx   . Esophageal cancer Neg Hx   . Stomach cancer Neg Hx   . Rectal cancer Neg Hx     Social History   Socioeconomic History  . Marital status: Married    Spouse name: Not on file  . Number of children: 3  . Years of education: 64  . Highest education level: Not on file  Occupational History  . Not on file  Tobacco Use  . Smoking status: Former Smoker    Packs/day: 1.00    Years: 30.00    Pack years: 30.00    Types: Cigarettes    Quit date: 03/15/1983    Years since quitting: 37.0  . Smokeless tobacco: Never Used  . Tobacco comment: Quit 1984  Substance and Sexual Activity  . Alcohol use: Not Currently    Alcohol/week: 5.0 - 7.0 standard drinks    Types: 5 - 7 Glasses of wine per week    Comment: quit etoh 1999 but in ~ 2017 began having a glass of wine with dinner   . Drug use: No  . Sexual activity: Never  Other Topics Concern  . Not on file  Social History Narrative   Married 1957, has 3 sons.   [place]. 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    End of life Care: DNR, no prolonged heroic measures or prolonged supportive care.    Former smoker, quit 1984.   Quit alcohol when dx'd with DM in 2009. ~ 2017 began having glass of wine with dinner.   No exercise.   Social Determinants of Health   Financial Resource Strain:   . Difficulty of Paying Living Expenses:   Food  Insecurity:   . Worried About Charity fundraiser in the Last Year:   . Arboriculturist in the Last Year:   Transportation Needs: No Transportation Needs  . Lack of Transportation (Medical): No  . Lack of Transportation (Non-Medical): No  Physical Activity:   . Days of Exercise per Week:   . Minutes of Exercise per Session:   Stress:   . Feeling of Stress :   Social Connections:   . Frequency of Communication with Friends and Family:   . Frequency of Social Gatherings with Friends and Family:   . Attends Religious Services:   . Active Member of Clubs or Organizations:   . Attends Archivist Meetings:   Marland Kitchen Marital Status:   Intimate Partner Violence:   . Fear of Current or Ex-Partner:   . Emotionally Abused:   Marland Kitchen Physically Abused:   . Sexually Abused:     REVIEW OF SYSTEMS: Constitutional: Overall was doing well until yesterday.  No profound fatigue or excessive sleepiness. ENT:  No nose bleeds.  Hearing loss, wears hearing aids Pulm: No trouble breathing, no cough. CV:  No palpitations, no LE edema.  GU:  No hematuria, no frequency.  No dark urine. GI: See HPI. Heme: No previous history of vomiting or passing blood. Transfusions: None Neuro:  No headaches, no peripheral tingling or numbness.  No syncope, no seizures. Derm:  No itching, no rash or sores.  Wife has not noticed jaundice. Endocrine:  No sweats or chills.  No polyuria or dysuria Immunization: Not queried. Travel:  None beyond local counties in last few months.    PHYSICAL EXAM: Vital signs in last 24  hours: Vitals:   03/31/2020 0852 04/01/2020 0855  BP: (!) 78/34 (!) 82/36  Pulse: 94 93  Resp: (!) 21 19  Temp:    SpO2: 98% 97%   Wt Readings from Last 3 Encounters:  04/18/2020 67.6 kg  02/26/20 67.6 kg  02/25/20 68.6 kg    General: Pleasant, comfortable, nonill appearing.  Alert, confused. Head: No signs of head trauma.  No facial asymmetry or swelling Eyes: Slight if any scleral  icterus. Ears: Hard of hearing Nose: No discharge or congestion. Mouth: Dark staining around his lips and on his tongue.  No active bleeding.  Tongue midline.  Mucosa is moist, clear.  Good dentition. Neck: No JVD, masses, thyromegaly. Lungs: No labored breathing, no cough.  Lungs clear bilaterally with good breath sounds Heart: RRR.  No MRG.  S1, S2 present Abdomen: Soft, nontender, nondistended.  No HSM, masses, bruits, hernias..   Rectal: Deferred Musc/Skeltl: No joint redness, swelling or gross deformity. Extremities: No CCE. Neurologic: Moves all 4 limbs, no tremors, no gross weakness.  Follows commands.  Knows who he is and that he is at the hospital but otherwise confused.  Non sequitur answers to questions.  Some of this may be due to his hearing impairment in addition to his dementia Skin: No jaundice.  No telangiectasia, rash, sores. Nodes: No cervical adenopathy Psych: Cooperative, pleasant, fluid speech, gets a little agitated at times  Intake/Output from previous day: No intake/output data recorded. Intake/Output this shift: Total I/O In: 200 [IV Piggyback:200] Out: -   LAB RESULTS: Recent Labs    04/07/2020 0358  WBC 8.0  HGB 10.4*  HCT 32.1*  PLT 160   BMET Lab Results  Component Value Date   NA 142 03/26/2020   NA 138 03/02/2020   NA 141 02/26/2020   K 4.0 03/22/2020   K 4.0 03/02/2020   K 4.7 02/26/2020   CL 111 03/27/2020   CL 103 03/02/2020   CL 105 02/26/2020   CO2 19 (L) 03/22/2020   CO2 27 03/02/2020   CO2 30 02/26/2020   GLUCOSE 149 (H) 03/19/2020   GLUCOSE 131 (H) 03/02/2020   GLUCOSE 170 (H) 02/26/2020   BUN 33 (H) 04/09/2020   BUN 16 03/02/2020   BUN 16 02/26/2020   CREATININE 1.34 (H) 04/04/2020   CREATININE 0.97 03/02/2020   CREATININE 1.03 02/26/2020   CALCIUM 8.0 (L) 04/05/2020   CALCIUM 8.6 03/02/2020   CALCIUM 9.2 02/26/2020   LFT Recent Labs    04/01/2020 0358  PROT 5.5*  ALBUMIN 2.8*  AST 978*  ALT 624*  ALKPHOS 275*   BILITOT 2.7*   PT/INR Lab Results  Component Value Date   INR 1.2 04/15/2020   INR 1.2 06/26/2019   INR 1.5 (H) 06/24/2019   Hepatitis Panel No results for input(s): HEPBSAG, HCVAB, HEPAIGM, HEPBIGM in the last 72 hours. C-Diff No components found for: CDIFF Lipase     Component Value Date/Time   LIPASE 70 (H) 04/04/2020 0358    Drugs of Abuse  No results found for: LABOPIA, COCAINSCRNUR, LABBENZ, AMPHETMU, THCU, LABBARB   RADIOLOGY STUDIES: CT ABDOMEN PELVIS W CONTRAST  Result Date: 03/23/2020 CLINICAL DATA:  Hematemesis EXAM: CT ABDOMEN AND PELVIS WITH CONTRAST TECHNIQUE: Multidetector CT imaging of the abdomen and pelvis was performed using the standard protocol following bolus administration of intravenous contrast. CONTRAST:  170m OMNIPAQUE IOHEXOL 300 MG/ML  SOLN COMPARISON:  07/09/2019 FINDINGS: Lower chest:  Extensive coronary calcification. Hepatobiliary: Plastic biliary stent in expected position.  There is prominent intrahepatic bile duct dilatation. Vague increased enhancement in the central liver without discrete masslike finding. The gallbladder is also distended without apparent wall thickening. Sludge and/or calculi present in the gallbladder. Pancreas: Unremarkable. Spleen: Unremarkable. Adrenals/Urinary Tract: Negative adrenals. No hydronephrosis or stone. Unremarkable bladder. Stomach/Bowel: No obstruction or evident inflammation. Extensive distal colonic diverticulosis. Fluid is seen within the stomach. No overt ulceration or gastritis. Vascular/Lymphatic: No acute vascular abnormality. Extensive atheromatous plaque of the aorta and iliacs. No mass or adenopathy. Reproductive:No pathologic findings. Other: No ascites or pneumoperitoneum. Musculoskeletal: No acute abnormalities. Transitional S1 vertebra. L5-S1 degenerative anterolisthesis, facet mediated IMPRESSION: 1. No specific explanation for hematemesis. 2. History of cholangiocarcinoma. There is gallbladder and  intrahepatic bile duct dilatation despite a well-positioned biliary stent. There is gallbladder sludge without findings of cholecystitis. Electronically Signed   By: Monte Fantasia M.D.   On: 04/10/2020 05:50     IMPRESSION:   *   Acutely elevated LFTs, abdominal pain in patient with cholangiocarcinoma, s/p biliary stenting. Presumed stent occlusion. Ciprofloxacin in place.  *   Hematemesis, rule out gastritis, ulcer, Mallory-Weiss tear.  *     AKI.  *   Macrocytic anemia. Macrocytosis present in August 2020. No evidence for folate, B12 or iron deficiency at that time.   PLAN:     *Patient needs ERCP as well as EGD.  Since he has not had any more episodes of vomiting since arrival to the ED, feel that procedures can be delayed until tomorrow.  He can have clear liquids. Add IV Protonix bid.  Hold the Lovenox.  Add SCDs.   ? add Flagyl to Cipro or switch to Zosyn?   Azucena Freed  04/15/2020, 8:59 AM Phone [phone]

## 2020-03-24 NOTE — ED Notes (Signed)
Paged Admitting Dr. Per RN

## 2020-03-24 NOTE — ED Notes (Signed)
Pt has nothing in cath bag as of yet. Advised pt we needed small amount of urine to send for testing.

## 2020-03-24 NOTE — H&P (Addendum)
History and Physical    Ricardo Washington INO:[id number] DOB: November 20, 1934 DOA: 04/02/2020  Referring MD/NP/PA: Gean Birchwood, MD PCP: [name], MD  Patient coming from: home via EMS  Chief Complaint: Vomiting blood  I have personally briefly reviewed patient's old medical records in [place]   HPI: Ricardo Washington is a 85 y.o. male with medical history significant of intrahepatic cholangiocarcinoma followed by Dr. Maylon Peppers, diabetes mellitus type 2, hypothyroidism, and Alzheimer's dementia who presented with complaints of vomiting blood starting around 1 AM this morning.  History is obtained from the patient's wife due to his history of dementia.  All day he had been complaining of severe pain in the middle of his back where the second stent was placed by Dr. Rogers Blocker in December 2020.  Since his stent was placed patient was noted to have intermittent complaints of pain that was short lasting.  He was able to eat lunch interval prior to onset of vomiting.  Vomiting was initially just food contents, but later on that morning turned into a coffee ground color.  ED Course: Upon admission into the emergency department patient was noted to be afebrile, pulse 95-1 04, respirations 10-23, blood pressure 94/47-123/43, and O2 saturations maintained.  Labs significant for hemoglobin 10.4 with elevated MCV/MCH, BUN 33, creatinine 1.34, troponin 11->21, alkaline phosphatase 275, albumin 2.8, AST 978, ALT 624, and total bilirubin 2.7.  Stool guaiacs were noted to be negative.  CT scan of the abdomen and pelvis noted gallbladder and intrahepatic biliary duct dilatation despite correct position of biliary stent.  Dr. Ardis Hughs of gastroenterology was consulted and recommended treating empirically with ciprofloxacin.  Review of Systems  Unable to perform ROS: Dementia  Gastrointestinal: Positive for abdominal pain, nausea and vomiting.    Past Medical History:  Diagnosis Date  . BPH with  obstruction/lower urinary tract symptoms 10/27/2015   Dr. Karsten Ro  . Cataract   . Cholangiocarcinoma ([place]) 2020   Mass detected 04/2018.  Intrahepatic cholangiocarcinoma of R hepatic lobe; stage 1A, cT1aN0M0->not candidate for intervention or chemo, Dr. Mammie Lorenzo finished 08/2019.  Biliary stent temporary->to be replaced by metal stent 10/2019.  Marland Kitchen Colon wall thickening 04/2018   "mass-like" per radiologist interpretation; colonoscopy as next step---f/u colonoscopy showed 'tics but o/w normal.  NO further colonoscopies needed due to age.  . Coronary artery disease    with preserved LV function.  . Dementia ([place])   . Diabetes mellitus without complication ([place])   . Diverticulosis of colon    severe, entire colon  . Dysphagia 11/2018   Barium swallow: mild esophageal dysmotility.  . Ectatic abdominal aorta ([place]) 01/2017   Abd u/s: 2.9 cm aortic ectasia--at risk for aneurism development.  Recheck aortic u/s 5 yrs.  . Erectile dysfunction due to arterial insufficiency   . Fatigue   . Gout    always 2nd toe L foot (uric acid 6.20 Dec 2014 per old records)  . History of adenomatous polyp of colon 2002  . History of stomach ulcers   . Hyperlipidemia   . Hypertension   . Hypogonadism male   . Hypothyroidism 09/2019   started low dose synthroid 10/05/19->TSH normalized 11/2019  . Klebsiella sepsis ([place]) 01/2017   due to acute biliary tract infection (no stones) and acute diverticulitis.  . Macrocytic anemia 01/2017   vit B12 borderline low and iron borderline low: checking hemoccults and starting vit B12 PO and iron PO as of 02/11/17.  Vit B12 and folate normal as of  GI f/u 06/2018.  . Myogenic ptosis of bilateral eyelids 2018   Plastic surgery in [place], [place] to do surg as of 03/2017.  Marland Kitchen NASH (nonalcoholic steatohepatitis) 06/2017   LFTs up, abd u/s showed fatty liver but no other abnormality.  . Osteoarthritis of right shoulder 09/2018   Near end-stage --->glenohumeral joint-->intra-articular  steroid injection 09/2018 (Dr. Delilah Shan).  11/2018-->end stage, but not ready for total shoulder replacement.  . Past use of tobacco    quit 1984  . Rectal bleeding   . Rosacea   . S/P coronary artery bypass graft x 3   . Urine incontinence    Dr. Karsten Ro    Past Surgical History:  Procedure Laterality Date  . BILIARY BRUSHING  06/26/2019   PATH: invasive adenocarcinoma.  Procedure: BILIARY BRUSHING;  Surgeon: Rush Landmark Telford Nab., MD;  Location: [facility];  Service: Gastroenterology;;  . BILIARY DILATION  06/26/2019   Procedure: BILIARY DILATION;  Surgeon: Irving Copas., MD;  Location: [facility];  Service: Gastroenterology;;  . BILIARY DILATION  11/18/2019   Procedure: BILIARY DILATION;  Surgeon: Irving Copas., MD;  Location: [facility];  Service: Gastroenterology;;  . BILIARY STENT PLACEMENT  06/26/2019   Procedure: BILIARY STENT PLACEMENT;  Surgeon: Irving Copas., MD;  Location: [facility];  Service: Gastroenterology;;  . BILIARY STENT PLACEMENT N/A 11/18/2019   Procedure: BILIARY STENT PLACEMENT;  Surgeon: Irving Copas., MD;  Location: [facility];  Service: Gastroenterology;  Laterality: N/A;  . CARDIOVASCULAR STRESS TEST  08/28/2016   Low risk myoview, normal EF, no ischemia.  Marland Kitchen CATARACT EXTRACTION, BILATERAL    . COLONOSCOPY  2002, 2006, 02/25/2009; 06/11/18   Polyp x 1 2002, none 2006 or 2010.  Adenomatous polyp 06/11/18+  signif diverticulosis.  NO FURTHER COLONOSCOPIES DUE TO AGE.  Marland Kitchen CORONARY ARTERY BYPASS GRAFT  06/2009   descending,saphenous vein graft to first obtuse marginal, sequential saphenous vein graft to posterior descending and posterolateral  . Endoscopic vein harvest right thigh    . ERCP N/A 06/26/2019   Procedure: ENDOSCOPIC RETROGRADE CHOLANGIOPANCREATOGRAPHY (ERCP);  Surgeon: Irving Copas., MD;  Location: [facility];  Service: Gastroenterology;  Laterality: N/A;  with stent   . ERCP N/A 11/18/2019    Procedure: ENDOSCOPIC RETROGRADE CHOLANGIOPANCREATOGRAPHY (ERCP);  Surgeon: Irving Copas., MD;  Location: [facility];  Service: Gastroenterology;  Laterality: N/A;  . IR RADIOLOGIST EVAL & MGMT  06/12/2018  . IR RADIOLOGIST EVAL & MGMT  09/18/2018  . IR RADIOLOGIST EVAL & MGMT  05/26/2019  . IR RADIOLOGIST EVAL & MGMT  07/29/2019  . PTCA    . REMOVAL OF STONES  06/26/2019   Procedure: REMOVAL OF STONES;  Surgeon: Rush Landmark Telford Nab., MD;  Location: [facility];  Service: Gastroenterology;;  . REMOVAL OF STONES  11/18/2019   Procedure: REMOVAL OF STONES;  Surgeon: Irving Copas., MD;  Location: [facility];  Service: Gastroenterology;;  . Joan Mayans  06/26/2019   Procedure: Joan Mayans;  Surgeon: Irving Copas., MD;  Location: [facility];  Service: Gastroenterology;;  . Lavell Islam REMOVAL  11/18/2019   Procedure: STENT REMOVAL;  Surgeon: Irving Copas., MD;  Location: [facility];  Service: Gastroenterology;;  . TONSILLECTOMY    . UMBILICAL HERNIA REPAIR     2009  . VASECTOMY       reports that he quit smoking about 37 years ago. His smoking use included cigarettes. He has a 30.00 pack-year smoking history. He has never used smokeless tobacco. He reports previous alcohol use of about  5.0 - 7.0 standard drinks of alcohol per week. He reports that he does not use drugs.  Allergies  Allergen Reactions  . Sulfonamide Derivatives Nausea Only    Family History  Problem Relation Age of Onset  . Cancer Mother   . Cancer Father        pt points to LLQ as  area of cancer, so potentially could have been intestinal.     . Colon cancer Neg Hx   . Esophageal cancer Neg Hx   . Stomach cancer Neg Hx   . Rectal cancer Neg Hx     Prior to Admission medications   Medication Sig Start Date End Date Taking? Authorizing Provider  allopurinol (ZYLOPRIM) 100 MG tablet TAKE ONE TABLET BY MOUTH ON MONDAY, WEDNESDAY, AND FRIDAY Patient taking differently: Take  100 mg by mouth every Monday, Wednesday, and Friday. In the morning. 08/24/19   McGowen, Adrian Blackwater, MD  aspirin 325 MG tablet Take 325 mg by mouth daily.      [provider]  betamethasone dipropionate (DIPROLENE) 0.05 % cream Apply 1 application topically daily as needed (skin irritation.).  12/17/18   [provider]  ferrous sulfate 325 (65 FE) MG EC tablet Take 325 mg by mouth daily with breakfast.    [provider]  finasteride (PROSCAR) 5 MG tablet Take 1 tablet (5 mg total) by mouth daily. 02/26/20   McGowen, Adrian Blackwater, MD  Lancets [facility] ULTRASOFT) lancets Use to check blood sugar once daily 04/15/18   McGowen, Adrian Blackwater, MD  levothyroxine (SYNTHROID) 25 MCG tablet Take 1 tablet (25 mcg total) by mouth daily before breakfast. 11/27/19   McGowen, Adrian Blackwater, MD  lisinopril (ZESTRIL) 5 MG tablet TAKE ONE TABLET BY MOUTH EVERY DAY 03/07/20   McGowen, Adrian Blackwater, MD  metFORMIN (GLUCOPHAGE-XR) 500 MG 24 hr tablet TAKE ONE TABLET BY MOUTH TWICE DAILY WITH MEALS Patient taking differently: Take 500 mg by mouth 2 (two) times daily with a meal.  06/22/19   McGowen, Adrian Blackwater, MD  Multiple Vitamin (MULTIVITAMIN WITH MINERALS) TABS tablet Take 1 tablet by mouth at bedtime.    [provider]  omeprazole (PRILOSEC) 40 MG capsule TAKE ONE CAPSULE BY MOUTH EVERY DAY 02/26/20   McGowen, Adrian Blackwater, MD  rosuvastatin (CRESTOR) 10 MG tablet Take 1 tablet (10 mg total) by mouth daily. 02/26/20   McGowen, Adrian Blackwater, MD  tamsulosin (FLOMAX) 0.4 MG CAPS capsule TAKE ONE CAPSULE BY MOUTH DAILY 03/14/20   McGowen, Adrian Blackwater, MD  Trospium Chloride 60 MG CP24 TAKE ONE CAPSULE BY MOUTH EVERY DAY 12/17/19   McGowen, Adrian Blackwater, MD    Physical Exam:  Constitutional: Elderly male who appears to be in no acute distress Vitals:   04/14/2020 0640 03/26/2020 0700 04/07/2020 0720 03/20/2020 0733  BP: 102/84 (!) 100/49 (!) 95/45 (!) 94/47  Pulse:  (!) 102 (!) 104 (!) 101  Resp: 16 19 (!) 23 10  Temp:      TempSrc:       SpO2:  97% 98% 97%  Weight:      Height:       Eyes: PERRL, lids and conjunctivae normal ENMT: Mucous membranes are dry. Posterior pharynx clear of any exudate or lesions.  Neck: normal, supple, no masses, no thyromegaly Respiratory: clear to auscultation bilaterally, no wheezing, no crackles. Normal respiratory effort. No accessory muscle use.  Cardiovascular: Mildly tachycardic.  No murmurs / rubs / gallops. No extremity edema. 2+ pedal pulses. No carotid  bruits.  Abdomen: no tenderness, no masses palpated. No hepatosplenomegaly. Bowel sounds positive.  Musculoskeletal: no clubbing / cyanosis. No joint deformity upper and lower extremities. Good ROM, no contractures. Normal muscle tone.  Skin: no rashes, lesions, ulcers. No induration Neurologic: CN 2-12 grossly intact. Sensation intact, DTR normal. Strength 5/5 in all 4.  Psychiatric: Poor memory.  Alert and oriented to self. Normal mood.     Labs on Admission: I have personally reviewed following labs and imaging studies  CBC: Recent Labs  Lab 04/09/2020 0358  WBC 8.0  NEUTROABS 7.6  HGB 10.4*  HCT 32.1*  MCV 107.4*  PLT 503   Basic Metabolic Panel: Recent Labs  Lab 04/12/2020 0358  NA 142  K 4.0  CL 111  CO2 19*  GLUCOSE 149*  BUN 33*  CREATININE 1.34*  CALCIUM 8.0*   GFR: Estimated Creatinine Clearance: 36.3 mL/min (A) (by C-G formula based on SCr of 1.34 mg/dL (H)). Liver Function Tests: Recent Labs  Lab 04/03/2020 0358  AST 978*  ALT 624*  ALKPHOS 275*  BILITOT 2.7*  PROT 5.5*  ALBUMIN 2.8*   Recent Labs  Lab 04/06/2020 0358  LIPASE 70*   Recent Labs  Lab 03/30/2020 0512  AMMONIA 18   Coagulation Profile: Recent Labs  Lab 04/14/2020 0358  INR 1.2   Cardiac Enzymes: No results for input(s): CKTOTAL, CKMB, CKMBINDEX, TROPONINI in the last 168 hours. BNP (last 3 results) No results for input(s): PROBNP in the last 8760 hours. HbA1C: No results for input(s): HGBA1C in the last 72  hours. CBG: No results for input(s): GLUCAP in the last 168 hours. Lipid Profile: No results for input(s): CHOL, HDL, LDLCALC, TRIG, CHOLHDL, LDLDIRECT in the last 72 hours. Thyroid Function Tests: No results for input(s): TSH, T4TOTAL, FREET4, T3FREE, THYROIDAB in the last 72 hours. Anemia Panel: No results for input(s): VITAMINB12, FOLATE, FERRITIN, TIBC, IRON, RETICCTPCT in the last 72 hours. Urine analysis:    Component Value Date/Time   COLORURINE AMBER (A) 06/23/2019 2158   APPEARANCEUR CLEAR 06/23/2019 2158   LABSPEC >1.046 (H) 06/23/2019 2158   PHURINE 6.0 06/23/2019 2158   GLUCOSEU NEGATIVE 06/23/2019 2158   HGBUR NEGATIVE 06/23/2019 2158   BILIRUBINUR NEGATIVE 06/23/2019 2158   KETONESUR 5 (A) 06/23/2019 2158   PROTEINUR 30 (A) 06/23/2019 2158   UROBILINOGEN 1.0 05/05/2011 1439   NITRITE NEGATIVE 06/23/2019 2158   LEUKOCYTESUR NEGATIVE 06/23/2019 2158   Sepsis Labs: No results found for this or any previous visit (from the past 240 hour(s)).   Radiological Exams on Admission: CT ABDOMEN PELVIS W CONTRAST  Result Date: 04/14/2020 CLINICAL DATA:  Hematemesis EXAM: CT ABDOMEN AND PELVIS WITH CONTRAST TECHNIQUE: Multidetector CT imaging of the abdomen and pelvis was performed using the standard protocol following bolus administration of intravenous contrast. CONTRAST:  160m OMNIPAQUE IOHEXOL 300 MG/ML  SOLN COMPARISON:  07/09/2019 FINDINGS: Lower chest:  Extensive coronary calcification. Hepatobiliary: Plastic biliary stent in expected position. There is prominent intrahepatic bile duct dilatation. Vague increased enhancement in the central liver without discrete masslike finding. The gallbladder is also distended without apparent wall thickening. Sludge and/or calculi present in the gallbladder. Pancreas: Unremarkable. Spleen: Unremarkable. Adrenals/Urinary Tract: Negative adrenals. No hydronephrosis or stone. Unremarkable bladder. Stomach/Bowel: No obstruction or evident  inflammation. Extensive distal colonic diverticulosis. Fluid is seen within the stomach. No overt ulceration or gastritis. Vascular/Lymphatic: No acute vascular abnormality. Extensive atheromatous plaque of the aorta and iliacs. No mass or adenopathy. Reproductive:No pathologic findings. Other: No ascites or pneumoperitoneum.  Musculoskeletal: No acute abnormalities. Transitional S1 vertebra. L5-S1 degenerative anterolisthesis, facet mediated IMPRESSION: 1. No specific explanation for hematemesis. 2. History of cholangiocarcinoma. There is gallbladder and intrahepatic bile duct dilatation despite a well-positioned biliary stent. There is gallbladder sludge without findings of cholecystitis. Electronically Signed   By: Monte Fantasia M.D.   On: 04/05/2020 05:50      Assessment/Plan  Hematemesis with nausea: Acute.  Patient presents with complaints of vomiting blood.  Stool guaiacs were negative.  Hemoglobin down to 10.4 g/dL, but previously baseline had been around 12 g/dL.  Question possibility of Mallory-Weiss tear from vomiting. -Admit to a medical telemetry bed -Aspiration precautions  -Clear liquid -Hold aspirin -Appreciate GI consultative services  Macrocytic anemia: Acute on chronic.  As seen above.  Patient was typed and screened for the possible need of blood products. -Add on pulmonary B12 and folate -Continue to monitor H&H -Transfuse blood products as needed  Biliary obstruction Elevated liver enzymes Hyperbilirubinemia: Acute.  Patient noted to have elevated liver enzymes and hyperbilirubinemia on lab work.  CT imaging with signs of intrahepatic bile duct and gallbladder dilatation despite well-positioned biliary stent concerning for possible obstruction.  GI consulted and recommended putting patient on empiric ciprofloxacin and plan for ERCP. -Continue ciprofloxacin -Appreciate GI consultative services, we will follow-up for further recommendation  Acute kidney injury: Patient's  baseline creatinine previously noted to be within normal limits.  He presents with creatinine elevated up to 1.34 with BUN 33.  Likely related with recent nausea and vomiting symptoms.  Elevated BUN to creatinine ratio suggest prerenal cause of symptoms. -IV fluids at 75 mL/h -Recheck creatinine in a.m.  Transient hypotension: Acute.  Initial blood pressure was noted to be as low as 78/34, but improved with IV fluids..  Patient on medications of lisinopril 5 mg daily. -Hold lisinopril -Bolus IV fluids as needed to maintain maps greater than 65  Elevated troponin: Acute.  On admission initial troponin XI had repeat 21.  Patient without reports of chest pain.  Suspect secondary to demand. -Continue to monitor  History of cholangiocarcinoma: He has a history of intrahepatic cholangiocarcinoma stage Ia initially diagnosed back in 2017.   Diabetes mellitus type 2: Patient appears to be well controlled.  Last hemoglobin A1c 6 in 2001. -Hypoglycemic protocols -Hold Metformin -CBGs before every meal with sensitive SSI  Hypothyroidism -Continue levothyroxine  BPH -Restart home medication when appropriate  Hyperlipidemia: -Restart statins when medically appropriate  GERD -Protonix IV    DVT prophylaxis: scds Code Status: Full Family Communication: Discussed plan of care with the patient's wife over the phone Disposition Plan: Possible discharge home in 2 to 3 days Consults called: GI Admission status: Inpatient  Norval Morton MD [facility] Pager [phone]   If 7PM-7AM, please contact night-coverage [email or web address] Password [email or web address]  03/26/2020, 7:53 AM

## 2020-03-24 NOTE — Progress Notes (Signed)
Pt is extremely confused and pulling lines, request submitted for safety sitter.

## 2020-03-24 NOTE — ED Notes (Signed)
Pt very confuse and getting out of bed without calling for assistance, bed alarm placed under the pt for fall precaution. Call bell at reach of the pt, pt oriented to don't get out of bed without assistance.

## 2020-03-24 NOTE — ED Provider Notes (Signed)
[facility] EMERGENCY DEPARTMENT Provider Note   CSN: [id number] Arrival date & time: 03/30/2020  0335     History Chief Complaint  Patient presents with  . Hematemesis    Ricardo Washington is a 85 y.o. male.  Patient presents to the emergency department for evaluation of abdominal pain and back pain. He started with mid back pain yesterday that has progressively worsened. Pain does not become abdominal pain as well. He initially was feeling just nausea but then he had vomiting. Wife reported blood in the vomit, at which point she called EMS. EMS report that he was initially hypertensive, but then dropped to 110/70 so he was given a fluid bolus.        Past Medical History:  Diagnosis Date  . BPH with obstruction/lower urinary tract symptoms 10/27/2015   Dr. Karsten Ro  . Cataract   . 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  Intrahepatic cholangiocarcinoma of R hepatic lobe; stage 1A, cT1aN0M0->not candidate for intervention or chemo, Dr. Mammie Lorenzo finished 08/2019.  Biliary stent temporary->to be replaced by metal stent 10/2019.  Marland Kitchen Colon wall thickening 04/2018   "mass-like" per radiologist interpretation; colonoscopy as next step---f/u colonoscopy showed 'tics but o/w normal.  NO further colonoscopies needed due to age.  . Coronary artery disease    with preserved LV function.  . Dementia ([place])   . Diabetes mellitus without complication ([place])   . Diverticulosis of colon    severe, entire colon  . Dysphagia 11/2018   Barium swallow: mild esophageal dysmotility.  . Ectatic abdominal aorta ([place]) 01/2017   Abd u/s: 2.9 cm aortic ectasia--at risk for aneurism development.  Recheck aortic u/s 5 yrs.  . Erectile dysfunction due to arterial insufficiency   . Fatigue   . Gout    always 2nd toe L foot (uric acid 6.20 Dec 2014 per old records)  . History of adenomatous polyp of colon 2002  . History of stomach ulcers   . Hyperlipidemia   .  Hypertension   . Hypogonadism male   . Hypothyroidism 09/2019   started low dose synthroid 10/05/19->TSH normalized 11/2019  . Klebsiella sepsis ([place]) 01/2017   due to acute biliary tract infection (no stones) and acute diverticulitis.  . Macrocytic anemia 01/2017   vit B12 borderline low and iron borderline low: checking hemoccults and starting vit B12 PO and iron PO as of 02/11/17.  Vit B12 and folate normal as of GI f/u 06/2018.  . Myogenic ptosis of bilateral eyelids 2018   Plastic surgery in [place] to do surg as of 03/2017.  Marland Kitchen NASH (nonalcoholic steatohepatitis) 06/2017   LFTs up, abd u/s showed fatty liver but no other abnormality.  . Osteoarthritis of right shoulder 09/2018   Near end-stage --->glenohumeral joint-->intra-articular steroid injection 09/2018 (Dr. Delilah Shan).  11/2018-->end stage, but not ready for total shoulder replacement.  . Past use of tobacco    quit 1984  . Rectal bleeding   . Rosacea   . S/P coronary artery bypass graft x 3   . Urine incontinence    Dr. Karsten Ro    Patient Active Problem List   Diagnosis Date Noted  . Nausea & vomiting 04/03/2020  . Goals of care, counseling/discussion 07/03/2019  . Macrocytic anemia 07/03/2019  . Bacteremia due to Klebsiella pneumoniae 06/26/2019  . Acute cholangitis 06/25/2019  . Abnormal liver function   . Biliary stricture   . E coli bacteremia   . Hypokalemia 06/24/2019  . Acute kidney  injury ([place]) 06/24/2019  . Alzheimer disease ([place]) 06/24/2019  . Cholangiocarcinoma ([place])   . Colitis   . Hypomagnesemia   . Hypophosphatemia   . Biliary sepsis 06/23/2019  . Osteoarthritis of right glenohumeral joint 10/01/2018  . Pain in joint of right shoulder 09/23/2018  . Abnormal CT scan 05/14/2018  . LFT elevation 05/14/2018  . Sepsis due to Klebsiella pneumoniae ([place]) 02/04/2017  . Acalculous cholecystitis   . Sepsis, unspecified organism ([place]) 01/31/2017  . Acute cholecystitis 01/31/2017  . Diverticulitis of colon  01/31/2017  . BPH with obstruction/lower urinary tract symptoms 10/27/2015  . Memory loss 02/16/2013  . Routine health maintenance 08/05/2012  . BPH (benign prostatic hyperplasia) 01/06/2010  . CATARACT, HX OF 01/06/2010  . Coronary atherosclerosis 08/07/2009  . TOBACCO USE, QUIT 08/07/2009  . CORONARY ARTERY BYPASS GRAFT, THREE VESSEL, HX OF 08/07/2009  . PERCUTANEOUS TRANSLUMINAL CORONARY ANGIOPLASTY, HX OF 08/07/2009  . Diverticulosis of colon 02/22/2009  . Personal history of colonic polyps 02/22/2009  . AODM 08/24/2008  . Diabetes mellitus, type 2 ([place]) 08/24/2008  . Other and unspecified hyperlipidemia 06/23/2008  . Pure hypercholesterolemia 06/23/2008  . HYPOGONADISM 04/16/2007  . GOUT 04/16/2007  . Essential hypertension 04/16/2007    Past Surgical History:  Procedure Laterality Date  . BILIARY BRUSHING  06/26/2019   PATH: invasive adenocarcinoma.  Procedure: BILIARY BRUSHING;  Surgeon: Rush Landmark Telford Nab., MD;  Location: [facility];  Service: Gastroenterology;;  . BILIARY DILATION  06/26/2019   Procedure: BILIARY DILATION;  Surgeon: Irving Copas., MD;  Location: [facility];  Service: Gastroenterology;;  . BILIARY DILATION  11/18/2019   Procedure: BILIARY DILATION;  Surgeon: Irving Copas., MD;  Location: [facility];  Service: Gastroenterology;;  . BILIARY STENT PLACEMENT  06/26/2019   Procedure: BILIARY STENT PLACEMENT;  Surgeon: Irving Copas., MD;  Location: [facility];  Service: Gastroenterology;;  . BILIARY STENT PLACEMENT N/A 11/18/2019   Procedure: BILIARY STENT PLACEMENT;  Surgeon: Irving Copas., MD;  Location: [facility];  Service: Gastroenterology;  Laterality: N/A;  . CARDIOVASCULAR STRESS TEST  08/28/2016   Low risk myoview, normal EF, no ischemia.  Marland Kitchen CATARACT EXTRACTION, BILATERAL    . COLONOSCOPY  2002, 2006, 02/25/2009; 06/11/18   Polyp x 1 2002, none 2006 or 2010.  Adenomatous polyp 06/11/18+  signif  diverticulosis.  NO FURTHER COLONOSCOPIES DUE TO AGE.  Marland Kitchen CORONARY ARTERY BYPASS GRAFT  06/2009   descending,saphenous vein graft to first obtuse marginal, sequential saphenous vein graft to posterior descending and posterolateral  . Endoscopic vein harvest right thigh    . ERCP N/A 06/26/2019   Procedure: ENDOSCOPIC RETROGRADE CHOLANGIOPANCREATOGRAPHY (ERCP);  Surgeon: Irving Copas., MD;  Location: [facility];  Service: Gastroenterology;  Laterality: N/A;  with stent   . ERCP N/A 11/18/2019   Procedure: ENDOSCOPIC RETROGRADE CHOLANGIOPANCREATOGRAPHY (ERCP);  Surgeon: Irving Copas., MD;  Location: [facility];  Service: Gastroenterology;  Laterality: N/A;  . IR RADIOLOGIST EVAL & MGMT  06/12/2018  . IR RADIOLOGIST EVAL & MGMT  09/18/2018  . IR RADIOLOGIST EVAL & MGMT  05/26/2019  . IR RADIOLOGIST EVAL & MGMT  07/29/2019  . PTCA    . REMOVAL OF STONES  06/26/2019   Procedure: REMOVAL OF STONES;  Surgeon: Rush Landmark Telford Nab., MD;  Location: [facility];  Service: Gastroenterology;;  . REMOVAL OF STONES  11/18/2019   Procedure: REMOVAL OF STONES;  Surgeon: Irving Copas., MD;  Location: [facility];  Service: Gastroenterology;;  . Joan Mayans  06/26/2019   Procedure:  SPHINCTEROTOMY;  Surgeon: Irving Copas., MD;  Location: [facility];  Service: Gastroenterology;;  . Lavell Islam REMOVAL  11/18/2019   Procedure: STENT REMOVAL;  Surgeon: Irving Copas., MD;  Location: [facility];  Service: Gastroenterology;;  . TONSILLECTOMY    . UMBILICAL HERNIA REPAIR     2009  . VASECTOMY         Family History  Problem Relation Age of Onset  . Cancer Mother   . Cancer Father        pt points to LLQ as  area of cancer, so potentially could have been intestinal.     . Colon cancer Neg Hx   . Esophageal cancer Neg Hx   . Stomach cancer Neg Hx   . Rectal cancer Neg Hx     Social History   Tobacco Use  . Smoking status: Former Smoker    Packs/day: 1.00      Years: 30.00    Pack years: 30.00    Types: Cigarettes    Quit date: 03/15/1983    Years since quitting: 37.0  . Smokeless tobacco: Never Used  . Tobacco comment: Quit 1984  Substance Use Topics  . Alcohol use: Not Currently    Alcohol/week: 5.0 - 7.0 standard drinks    Types: 5 - 7 Glasses of wine per week    Comment: quit etoh 1999 but in ~ 2017 began having a glass of wine with dinner   . Drug use: No    Home Medications Prior to Admission medications   Medication Sig Start Date End Date Taking? Authorizing Provider  allopurinol (ZYLOPRIM) 100 MG tablet TAKE ONE TABLET BY MOUTH ON MONDAY, WEDNESDAY, AND FRIDAY Patient taking differently: Take 100 mg by mouth every Monday, Wednesday, and Friday. In the morning. 08/24/19   McGowen, Adrian Blackwater, MD  aspirin 325 MG tablet Take 325 mg by mouth daily.      [provider]  betamethasone dipropionate (DIPROLENE) 0.05 % cream Apply 1 application topically daily as needed (skin irritation.).  12/17/18   [provider]  ferrous sulfate 325 (65 FE) MG EC tablet Take 325 mg by mouth daily with breakfast.    [provider]  finasteride (PROSCAR) 5 MG tablet Take 1 tablet (5 mg total) by mouth daily. 02/26/20   McGowen, Adrian Blackwater, MD  Lancets [facility] ULTRASOFT) lancets Use to check blood sugar once daily 04/15/18   McGowen, Adrian Blackwater, MD  levothyroxine (SYNTHROID) 25 MCG tablet Take 1 tablet (25 mcg total) by mouth daily before breakfast. 11/27/19   McGowen, Adrian Blackwater, MD  lisinopril (ZESTRIL) 5 MG tablet TAKE ONE TABLET BY MOUTH EVERY DAY 03/07/20   McGowen, Adrian Blackwater, MD  metFORMIN (GLUCOPHAGE-XR) 500 MG 24 hr tablet TAKE ONE TABLET BY MOUTH TWICE DAILY WITH MEALS Patient taking differently: Take 500 mg by mouth 2 (two) times daily with a meal.  06/22/19   McGowen, Adrian Blackwater, MD  Multiple Vitamin (MULTIVITAMIN WITH MINERALS) TABS tablet Take 1 tablet by mouth at bedtime.    [provider]  omeprazole (PRILOSEC) 40 MG  capsule TAKE ONE CAPSULE BY MOUTH EVERY DAY 02/26/20   McGowen, Adrian Blackwater, MD  rosuvastatin (CRESTOR) 10 MG tablet Take 1 tablet (10 mg total) by mouth daily. 02/26/20   McGowen, Adrian Blackwater, MD  tamsulosin (FLOMAX) 0.4 MG CAPS capsule TAKE ONE CAPSULE BY MOUTH DAILY 03/14/20   McGowen, Adrian Blackwater, MD  Trospium Chloride 60 MG CP24 TAKE ONE CAPSULE BY MOUTH EVERY DAY 12/17/19  McGowen, Adrian Blackwater, MD    Allergies    Sulfonamide derivatives  Review of Systems   Review of Systems  Gastrointestinal: Positive for abdominal pain, nausea and vomiting.  Musculoskeletal: Positive for back pain.  All other systems reviewed and are negative.   Physical Exam Updated Vital Signs BP (!) 116/59   Pulse (!) 104   Temp 98.3 F (36.8 C) (Oral)   Resp (!) 21   Ht 5' 5"  (1.651 m)   Wt 67.6 kg   SpO2 99%   BMI 24.80 kg/m   Physical Exam Vitals and nursing note reviewed.  Constitutional:      General: He is not in acute distress.    Appearance: Normal appearance. He is well-developed.  HENT:     Head: Normocephalic and atraumatic.     Right Ear: Hearing normal.     Left Ear: Hearing normal.     Nose: Nose normal.  Eyes:     Conjunctiva/sclera: Conjunctivae normal.     Pupils: Pupils are equal, round, and reactive to light.  Cardiovascular:     Rate and Rhythm: Regular rhythm.     Heart sounds: S1 normal and S2 normal. No murmur. No friction rub. No gallop.   Pulmonary:     Effort: Pulmonary effort is normal. No respiratory distress.     Breath sounds: Normal breath sounds.  Chest:     Chest wall: No tenderness.  Abdominal:     General: Bowel sounds are normal.     Palpations: Abdomen is soft.     Tenderness: There is abdominal tenderness in the epigastric area. There is no guarding or rebound. Negative signs include Murphy's sign and McBurney's sign.     Hernia: No hernia is present.  Musculoskeletal:        General: Normal range of motion.     Cervical back: Normal range of motion and neck  supple.  Skin:    General: Skin is warm and dry.     Findings: No rash.  Neurological:     Mental Status: He is alert and oriented to person, place, and time.     GCS: GCS eye subscore is 4. GCS verbal subscore is 5. GCS motor subscore is 6.     Cranial Nerves: No cranial nerve deficit.     Sensory: No sensory deficit.     Coordination: Coordination normal.  Psychiatric:        Speech: Speech normal.        Behavior: Behavior normal.        Thought Content: Thought content normal.     ED Results / Procedures / Treatments   Labs (all labs ordered are listed, but only abnormal results are displayed) Labs Reviewed  COMPREHENSIVE METABOLIC PANEL - Abnormal; Notable for the following components:      Result Value   CO2 19 (*)    Glucose, Bld 149 (*)    BUN 33 (*)    Creatinine, Ser 1.34 (*)    Calcium 8.0 (*)    Total Protein 5.5 (*)    Albumin 2.8 (*)    AST 978 (*)    ALT 624 (*)    Alkaline Phosphatase 275 (*)    Total Bilirubin 2.7 (*)    GFR calc non Af Amer 49 (*)    GFR calc Af Amer 56 (*)    All other components within normal limits  CBC - Abnormal; Notable for the following components:   RBC 2.99 (*)  Hemoglobin 10.4 (*)    HCT 32.1 (*)    MCV 107.4 (*)    MCH 34.8 (*)    All other components within normal limits  LIPASE, BLOOD - Abnormal; Notable for the following components:   Lipase 70 (*)    All other components within normal limits  DIFFERENTIAL - Abnormal; Notable for the following components:   Lymphs Abs 0.2 (*)    All other components within normal limits  RESPIRATORY PANEL BY RT PCR (FLU A&B, COVID)  PROTIME-INR  URINALYSIS, ROUTINE W REFLEX MICROSCOPIC  AMMONIA  POC OCCULT BLOOD, ED  TYPE AND SCREEN  TROPONIN I (HIGH SENSITIVITY)  TROPONIN I (HIGH SENSITIVITY)    EKG None  Radiology CT ABDOMEN PELVIS W CONTRAST  Result Date: 04/10/2020 CLINICAL DATA:  Hematemesis EXAM: CT ABDOMEN AND PELVIS WITH CONTRAST TECHNIQUE: Multidetector CT  imaging of the abdomen and pelvis was performed using the standard protocol following bolus administration of intravenous contrast. CONTRAST:  130m OMNIPAQUE IOHEXOL 300 MG/ML  SOLN COMPARISON:  07/09/2019 FINDINGS: Lower chest:  Extensive coronary calcification. Hepatobiliary: Plastic biliary stent in expected position. There is prominent intrahepatic bile duct dilatation. Vague increased enhancement in the central liver without discrete masslike finding. The gallbladder is also distended without apparent wall thickening. Sludge and/or calculi present in the gallbladder. Pancreas: Unremarkable. Spleen: Unremarkable. Adrenals/Urinary Tract: Negative adrenals. No hydronephrosis or stone. Unremarkable bladder. Stomach/Bowel: No obstruction or evident inflammation. Extensive distal colonic diverticulosis. Fluid is seen within the stomach. No overt ulceration or gastritis. Vascular/Lymphatic: No acute vascular abnormality. Extensive atheromatous plaque of the aorta and iliacs. No mass or adenopathy. Reproductive:No pathologic findings. Other: No ascites or pneumoperitoneum. Musculoskeletal: No acute abnormalities. Transitional S1 vertebra. L5-S1 degenerative anterolisthesis, facet mediated IMPRESSION: 1. No specific explanation for hematemesis. 2. History of cholangiocarcinoma. There is gallbladder and intrahepatic bile duct dilatation despite a well-positioned biliary stent. There is gallbladder sludge without findings of cholecystitis. Electronically Signed   By: JMonte FantasiaM.D.   On: 04/10/2020 05:50    Procedures Procedures (including critical care time)  Medications Ordered in ED Medications  ciprofloxacin (CIPRO) IVPB 400 mg (has no administration in time range)  iohexol (OMNIPAQUE) 300 MG/ML solution 100 mL (100 mLs Intravenous Contrast Given 03/31/2020 0516)    ED Course  I have reviewed the triage vital signs and the nursing notes.  Pertinent labs & imaging results that were available during my  care of the patient were reviewed by me and considered in my medical decision making (see chart for details).    MDM Rules/Calculators/A&P                      Patient presents to the emergency department with complaints of back pain, abdominal pain and vomiting.  Patient does have a history of cholangiocarcinoma.  He has had previous biliary stenting.  Work-up today shows elevated LFTs, slightly elevated lipase.  CT shows adequate placement of the biliary stent, however there is biliary dilatation.  [facility]  Recommend Cipro empirically and GI will see.  Patient will be admitted to hospitalist service.  Final Clinical Impression(s) / ED Diagnoses Final diagnoses:  Biliary obstruction    Rx / DC Orders ED Discharge Orders    None       POrpah Greek MD 03/26/2020 [phone]

## 2020-03-24 NOTE — Plan of Care (Signed)
  Problem: Education: Goal: Knowledge of General Education information will improve Description Including pain rating scale, medication(s)/side effects and non-pharmacologic comfort measures Outcome: Progressing   

## 2020-03-24 NOTE — ED Notes (Signed)
Lunch Tray Ordered @ 7615.

## 2020-03-24 NOTE — ED Notes (Signed)
UA Culture sent to lab

## 2020-03-24 NOTE — ED Notes (Signed)
Notified Dr. Hal Hope of patient's hypotensive BPS; Pt's SBP 70-80's, with MAPS between 50-70. Pt a&o x4; asymptomatic.   Also notified Azucena Freed, PA when assessing pt at the bedside of low Bps; will continue to monitor.

## 2020-03-24 NOTE — ED Triage Notes (Signed)
Per EMS, from home w/ wife, pt has blood in emesis, for an unknown amount of time.  Pt is weak, blood pressures started in 140/80 w/ the last one being 110/70 w/ 500bolus NS.  Pt is very pale.    Per EMS, pt and wife described sharp pains b/t his shoulder blades X2 weeks.    20G L forarm 110/70 100HR CBG 168 98%  His wife will be here when the sun comes up, can't drive before that.

## 2020-03-24 NOTE — ED Notes (Signed)
Pt restufl at this time without attempts to get up.  Calm and drowsy.  Denies any pain or needs at this time.

## 2020-03-24 NOTE — ED Notes (Signed)
Pt taken to room 1. Placed on monitor and advised him he was waiting on a bed upstairs.

## 2020-03-25 ENCOUNTER — Ambulatory Visit (HOSPITAL_COMMUNITY): Admission: RE | Admit: 2020-03-25 | Payer: Medicare Other | Source: Home / Self Care | Admitting: Gastroenterology

## 2020-03-25 ENCOUNTER — Inpatient Hospital Stay (HOSPITAL_COMMUNITY): Payer: Medicare Other | Admitting: Certified Registered Nurse Anesthetist

## 2020-03-25 ENCOUNTER — Inpatient Hospital Stay (HOSPITAL_COMMUNITY): Payer: Medicare Other

## 2020-03-25 ENCOUNTER — Encounter (HOSPITAL_COMMUNITY): Payer: Self-pay | Admitting: Internal Medicine

## 2020-03-25 ENCOUNTER — Encounter (HOSPITAL_COMMUNITY): Admission: EM | Disposition: E | Payer: Self-pay | Source: Home / Self Care | Attending: Internal Medicine

## 2020-03-25 DIAGNOSIS — J9601 Acute respiratory failure with hypoxia: Secondary | ICD-10-CM

## 2020-03-25 DIAGNOSIS — K922 Gastrointestinal hemorrhage, unspecified: Secondary | ICD-10-CM

## 2020-03-25 DIAGNOSIS — K831 Obstruction of bile duct: Secondary | ICD-10-CM

## 2020-03-25 DIAGNOSIS — Z4659 Encounter for fitting and adjustment of other gastrointestinal appliance and device: Secondary | ICD-10-CM

## 2020-03-25 HISTORY — PX: BILIARY DILATION: SHX6850

## 2020-03-25 HISTORY — PX: ESOPHAGOGASTRODUODENOSCOPY (EGD) WITH PROPOFOL: SHX5813

## 2020-03-25 HISTORY — PX: ENDOSCOPIC RETROGRADE CHOLANGIOPANCREATOGRAPHY (ERCP) WITH PROPOFOL: SHX5810

## 2020-03-25 LAB — POCT I-STAT 7, (LYTES, BLD GAS, ICA,H+H)
Acid-base deficit: 5 mmol/L — ABNORMAL HIGH (ref 0.0–2.0)
Acid-base deficit: 9 mmol/L — ABNORMAL HIGH (ref 0.0–2.0)
Bicarbonate: 17.3 mmol/L — ABNORMAL LOW (ref 20.0–28.0)
Bicarbonate: 20.4 mmol/L (ref 20.0–28.0)
Calcium, Ion: 1.11 mmol/L — ABNORMAL LOW (ref 1.15–1.40)
Calcium, Ion: 1.14 mmol/L — ABNORMAL LOW (ref 1.15–1.40)
HCT: 18 % — ABNORMAL LOW (ref 39.0–52.0)
HCT: 19 % — ABNORMAL LOW (ref 39.0–52.0)
Hemoglobin: 6.1 g/dL — CL (ref 13.0–17.0)
Hemoglobin: 6.5 g/dL — CL (ref 13.0–17.0)
O2 Saturation: 100 %
O2 Saturation: 100 %
Patient temperature: 36.3
Potassium: 3.9 mmol/L (ref 3.5–5.1)
Potassium: 4.6 mmol/L (ref 3.5–5.1)
Sodium: 141 mmol/L (ref 135–145)
Sodium: 142 mmol/L (ref 135–145)
TCO2: 19 mmol/L — ABNORMAL LOW (ref 22–32)
TCO2: 22 mmol/L (ref 22–32)
pCO2 arterial: 37.9 mmHg (ref 32.0–48.0)
pCO2 arterial: 41.5 mmHg (ref 32.0–48.0)
pH, Arterial: 7.227 — ABNORMAL LOW (ref 7.350–7.450)
pH, Arterial: 7.335 — ABNORMAL LOW (ref 7.350–7.450)
pO2, Arterial: 378 mmHg — ABNORMAL HIGH (ref 83.0–108.0)
pO2, Arterial: 381 mmHg — ABNORMAL HIGH (ref 83.0–108.0)

## 2020-03-25 LAB — HEPATIC FUNCTION PANEL
ALT: 253 U/L — ABNORMAL HIGH (ref 0–44)
AST: 221 U/L — ABNORMAL HIGH (ref 15–41)
Albumin: 1.8 g/dL — ABNORMAL LOW (ref 3.5–5.0)
Alkaline Phosphatase: 135 U/L — ABNORMAL HIGH (ref 38–126)
Bilirubin, Direct: 1.5 mg/dL — ABNORMAL HIGH (ref 0.0–0.2)
Indirect Bilirubin: 0.6 mg/dL (ref 0.3–0.9)
Total Bilirubin: 2.1 mg/dL — ABNORMAL HIGH (ref 0.3–1.2)
Total Protein: 3.9 g/dL — ABNORMAL LOW (ref 6.5–8.1)

## 2020-03-25 LAB — FOLATE RBC
Folate, Hemolysate: 281 ng/mL
Folate, RBC: 1041 ng/mL (ref >498)
Hematocrit: 27 % — ABNORMAL LOW (ref 37.5–51.0)

## 2020-03-25 LAB — BASIC METABOLIC PANEL
Anion gap: 10 (ref 5–15)
BUN: 47 mg/dL — ABNORMAL HIGH (ref 8–23)
CO2: 18 mmol/L — ABNORMAL LOW (ref 22–32)
Calcium: 7.3 mg/dL — ABNORMAL LOW (ref 8.9–10.3)
Chloride: 112 mmol/L — ABNORMAL HIGH (ref 98–111)
Creatinine, Ser: 1.57 mg/dL — ABNORMAL HIGH (ref 0.61–1.24)
GFR calc Af Amer: 47 mL/min — ABNORMAL LOW (ref >60)
GFR calc non Af Amer: 40 mL/min — ABNORMAL LOW (ref >60)
Glucose, Bld: 213 mg/dL — ABNORMAL HIGH (ref 70–99)
Potassium: 3.8 mmol/L (ref 3.5–5.1)
Sodium: 140 mmol/L (ref 135–145)

## 2020-03-25 LAB — CBC
HCT: 19.1 % — ABNORMAL LOW (ref 39.0–52.0)
Hemoglobin: 6.3 g/dL — CL (ref 13.0–17.0)
MCH: 35.2 pg — ABNORMAL HIGH (ref 26.0–34.0)
MCHC: 33 g/dL (ref 30.0–36.0)
MCV: 106.7 fL — ABNORMAL HIGH (ref 80.0–100.0)
Platelets: 120 10*3/uL — ABNORMAL LOW (ref 150–400)
RBC: 1.79 MIL/uL — ABNORMAL LOW (ref 4.22–5.81)
RDW: 14.6 % (ref 11.5–15.5)
WBC: 12.5 10*3/uL — ABNORMAL HIGH (ref 4.0–10.5)
nRBC: 0 % (ref 0.0–0.2)

## 2020-03-25 LAB — PREPARE RBC (CROSSMATCH)

## 2020-03-25 LAB — GLUCOSE, CAPILLARY
Glucose-Capillary: 203 mg/dL — ABNORMAL HIGH (ref 70–99)
Glucose-Capillary: 208 mg/dL — ABNORMAL HIGH (ref 70–99)

## 2020-03-25 SURGERY — ERCP, WITH INTERVENTION IF INDICATED
Anesthesia: General

## 2020-03-25 SURGERY — ENDOSCOPIC RETROGRADE CHOLANGIOPANCREATOGRAPHY (ERCP) WITH PROPOFOL
Anesthesia: General

## 2020-03-25 MED ORDER — VASOPRESSIN 20 UNIT/ML IV SOLN
INTRAVENOUS | Status: AC
  Filled 2020-03-25: qty 1

## 2020-03-25 MED ORDER — MORPHINE 100MG IN NS 100ML (1MG/ML) PREMIX INFUSION
5.0000 mg/h | INTRAVENOUS | Status: DC
  Administered 2020-03-25: 5 mg/h via INTRAVENOUS
  Filled 2020-03-25: qty 100

## 2020-03-25 MED ORDER — PROPOFOL 10 MG/ML IV BOLUS
INTRAVENOUS | Status: DC | PRN
  Administered 2020-03-25: 60 mg via INTRAVENOUS

## 2020-03-25 MED ORDER — PHENYLEPHRINE HCL (PRESSORS) 10 MG/ML IV SOLN
INTRAVENOUS | Status: DC | PRN
  Administered 2020-03-25 (×2): 120 ug via INTRAVENOUS
  Administered 2020-03-25: 200 ug via INTRAVENOUS

## 2020-03-25 MED ORDER — EPINEPHRINE PF 1 MG/ML IJ SOLN
INTRAMUSCULAR | Status: DC | PRN
Start: 2020-03-25 — End: 2020-03-25
  Administered 2020-03-25: .02 mg via INTRAVENOUS
  Administered 2020-03-25: .01 mg via INTRAVENOUS

## 2020-03-25 MED ORDER — CALCIUM CHLORIDE 10 % IV SOLN
INTRAVENOUS | Status: DC | PRN
  Administered 2020-03-25: 300 mg via INTRAVENOUS
  Administered 2020-03-25: 200 mg via INTRAVENOUS

## 2020-03-25 MED ORDER — ROCURONIUM BROMIDE 10 MG/ML (PF) SYRINGE
PREFILLED_SYRINGE | INTRAVENOUS | Status: DC | PRN
  Administered 2020-03-25 (×2): 50 mg via INTRAVENOUS

## 2020-03-25 MED ORDER — MORPHINE BOLUS VIA INFUSION
4.0000 mg | INTRAVENOUS | Status: DC | PRN
  Filled 2020-03-25: qty 4

## 2020-03-25 MED ORDER — FENTANYL CITRATE (PF) 100 MCG/2ML IJ SOLN
INTRAMUSCULAR | Status: DC | PRN
  Administered 2020-03-25 (×2): 50 ug via INTRAVENOUS

## 2020-03-25 MED ORDER — GLUCAGON HCL RDNA (DIAGNOSTIC) 1 MG IJ SOLR
INTRAMUSCULAR | Status: AC
  Filled 2020-03-25: qty 1

## 2020-03-25 MED ORDER — GLUCAGON HCL RDNA (DIAGNOSTIC) 1 MG IJ SOLR
INTRAMUSCULAR | Status: DC | PRN
Start: 2020-03-25 — End: 2020-03-25
  Administered 2020-03-25 (×4): .25 mg via INTRAVENOUS

## 2020-03-25 MED ORDER — INDOMETHACIN 50 MG RE SUPP
RECTAL | Status: AC
  Filled 2020-03-25: qty 2

## 2020-03-25 MED ORDER — SODIUM CHLORIDE 0.9% IV SOLUTION: Freq: Once | INTRAVENOUS | Status: AC

## 2020-03-25 MED ORDER — VASOPRESSIN 20 UNIT/ML IV SOLN
INTRAVENOUS | Status: DC | PRN
Start: 2020-03-25 — End: 2020-03-25
  Administered 2020-03-25: 2 [IU] via INTRAVENOUS
  Administered 2020-03-25: 4 [IU] via INTRAVENOUS
  Administered 2020-03-25: 3 [IU] via INTRAVENOUS
  Administered 2020-03-25: 4 [IU] via INTRAVENOUS
  Administered 2020-03-25: 2 [IU] via INTRAVENOUS
  Administered 2020-03-25: 1 [IU] via INTRAVENOUS
  Administered 2020-03-25 (×2): 2 [IU] via INTRAVENOUS

## 2020-03-25 MED ORDER — SODIUM CHLORIDE (PF) 0.9 % IJ SOLN
INTRAVENOUS | Status: DC | PRN
  Administered 2020-03-25: 75 mL

## 2020-03-25 MED ORDER — ONDANSETRON HCL 4 MG/2ML IJ SOLN
INTRAMUSCULAR | Status: DC | PRN
  Administered 2020-03-25: 4 mg via INTRAVENOUS

## 2020-03-25 MED ORDER — SODIUM CHLORIDE 0.9 % IV BOLUS
500.0000 mL | Freq: Once | INTRAVENOUS | Status: AC | PRN
  Administered 2020-03-25: 500 mL via INTRAVENOUS

## 2020-03-25 MED ORDER — SODIUM CHLORIDE 0.9% IV SOLUTION: Freq: Once | INTRAVENOUS | Status: DC

## 2020-03-25 MED ORDER — ALBUMIN HUMAN 5 % IV SOLN: INTRAVENOUS | Status: DC | PRN

## 2020-03-25 MED ORDER — LIDOCAINE 2% (20 MG/ML) 5 ML SYRINGE
INTRAMUSCULAR | Status: DC | PRN
  Administered 2020-03-25: 50 mg via INTRAVENOUS

## 2020-03-25 MED ORDER — PHENYLEPHRINE HCL-NACL 10-0.9 MG/250ML-% IV SOLN
INTRAVENOUS | Status: DC | PRN
  Administered 2020-03-25: 75 ug/min via INTRAVENOUS

## 2020-03-25 MED ORDER — INDOMETHACIN 50 MG RE SUPP
RECTAL | Status: DC | PRN
  Administered 2020-03-25: 100 mg via RECTAL

## 2020-03-25 MED ORDER — LACTATED RINGERS IV SOLN: INTRAVENOUS | Status: DC | PRN

## 2020-03-25 MED ORDER — SODIUM CHLORIDE 0.9 % IV SOLN: INTRAVENOUS | Status: DC

## 2020-03-25 MED ORDER — SODIUM BICARBONATE 4.2 % IV SOLN
INTRAVENOUS | Status: DC | PRN
  Administered 2020-03-25: 50 meq via INTRAVENOUS

## 2020-03-26 DIAGNOSIS — E039 Hypothyroidism, unspecified: Secondary | ICD-10-CM

## 2020-03-26 DIAGNOSIS — K838 Other specified diseases of biliary tract: Secondary | ICD-10-CM

## 2020-03-26 DIAGNOSIS — I248 Other forms of acute ischemic heart disease: Secondary | ICD-10-CM

## 2020-03-26 DIAGNOSIS — K219 Gastro-esophageal reflux disease without esophagitis: Secondary | ICD-10-CM

## 2020-03-26 DIAGNOSIS — R571 Hypovolemic shock: Secondary | ICD-10-CM

## 2020-03-26 DIAGNOSIS — I959 Hypotension, unspecified: Secondary | ICD-10-CM

## 2020-03-26 DIAGNOSIS — D62 Acute posthemorrhagic anemia: Secondary | ICD-10-CM

## 2020-03-26 LAB — BPAM RBC
Blood Product Expiration Date: 202106022359
Blood Product Expiration Date: 202106022359
Blood Product Expiration Date: 202106032359
Blood Product Expiration Date: 202106032359
Blood Product Expiration Date: 202106082359
Blood Product Expiration Date: 202106092359
Blood Product Expiration Date: 202106102359
Blood Product Expiration Date: 202106102359
Blood Product Expiration Date: 202106102359
Blood Product Expiration Date: 202106102359
Blood Product Expiration Date: 202106102359
Blood Product Expiration Date: 202106102359
Blood Product Expiration Date: 202106102359
ISSUE DATE / TIME: 202105070257
ISSUE DATE / TIME: 202105070553
ISSUE DATE / TIME: 202105071309
ISSUE DATE / TIME: 202105071332
ISSUE DATE / TIME: 202105071332
ISSUE DATE / TIME: 202105071354
ISSUE DATE / TIME: 202105071354
ISSUE DATE / TIME: 202105071354
ISSUE DATE / TIME: 202105071354
Unit Type and Rh: 5100
Unit Type and Rh: 5100
Unit Type and Rh: 5100
Unit Type and Rh: 5100
Unit Type and Rh: 5100
Unit Type and Rh: 5100
Unit Type and Rh: 5100
Unit Type and Rh: 5100
Unit Type and Rh: 5100
Unit Type and Rh: 5100
Unit Type and Rh: 5100
Unit Type and Rh: 5100
Unit Type and Rh: 5100

## 2020-03-26 LAB — PREPARE FRESH FROZEN PLASMA: Unit division: 0

## 2020-03-26 LAB — TYPE AND SCREEN
ABO/RH(D): O POS
Antibody Screen: NEGATIVE
Unit division: 0
Unit division: 0
Unit division: 0
Unit division: 0
Unit division: 0
Unit division: 0
Unit division: 0
Unit division: 0
Unit division: 0
Unit division: 0
Unit division: 0
Unit division: 0
Unit division: 0

## 2020-03-26 LAB — BPAM FFP
Blood Product Expiration Date: 202105112359
Blood Product Expiration Date: 202105122359
ISSUE DATE / TIME: 202105071350
ISSUE DATE / TIME: 202105071350
Unit Type and Rh: 6200
Unit Type and Rh: 6200

## 2020-03-26 LAB — PREPARE PLATELET PHERESIS: Unit division: 0

## 2020-03-26 LAB — BPAM PLATELET PHERESIS
Blood Product Expiration Date: 202105082359
ISSUE DATE / TIME: 202105071336
Unit Type and Rh: 5100

## 2020-04-16 ENCOUNTER — Other Ambulatory Visit (HOSPITAL_COMMUNITY): Payer: Medicare Other

## 2020-04-19 NOTE — Progress Notes (Signed)
PROGRESS NOTE    Ricardo Washington  BSJ:[id number] DOB: [birth year] DOA: 03/31/2020 PCP: [name], MD     Brief Narrative:  Ricardo Washington is an 85 y.o. male with medical history significant of intrahepatic cholangiocarcinoma followed by Dr. Maylon Peppers, diabetes mellitus type 2, hypothyroidism, and Alzheimer's dementia who presented with complaints of vomiting blood starting around 1 AM 04/07/2020.  History is obtained from the patient's wife due to his history of dementia.  All day he had been complaining of severe pain in the middle of his back where the second stent was placed by Dr. Rogers Blocker in December 2020.  Since his stent was placed, patient was noted to have intermittent complaints of pain that was short lasting. CT scan of the abdomen and pelvis noted gallbladder and intrahepatic biliary duct dilatation despite correct position of biliary stent. GI consulted.  New events last 24 hours / Subjective: Patient with rapid response overnight due to hypotension.  He was noted to have anemia and was ordered 2 unit packed red blood cells with improvement in his blood pressure.  He also had episodes of agitation, confusion requiring mittens for patient safety.  Assessment & Plan:   Principal Problem:   Hematemesis with nausea Active Problems:   BPH (benign prostatic hyperplasia)   Diabetes mellitus, type 2 (HCC)   LFT elevation   Acute kidney injury ([place])   Alzheimer disease (HCC)   Cholangiocarcinoma (HCC)   Macrocytic anemia   Biliary obstruction   Hyperbilirubinemia   Hematemesis with nausea, acute blood loss anemia, hypotension -Could be secondary to Mallory-Weiss tear from vomiting -Transfused 2 unit packed red blood cell 5/7 -EGD today  Biliary obstruction with history of cholangiosarcoma -Patient noted to have elevated liver enzymes and hyperbilirubinemia on lab work.  CT imaging with signs of intrahepatic bile duct and gallbladder dilatation despite well-positioned biliary  stent concerning for possible obstruction. -GI consulted -Ciprofloxacin/flagyl  -ERCP 5/7  Acute kidney injury -Baseline creatinine 1 -IVF   Elevated troponin -Suspect secondary to demand ischemia  Diabetes mellitus type 2 -Sliding-scale insulin  Hypothyroidism -Continue levothyroxine  GERD -Protonix IV  Dementia -Delirium precaution, supportive care -May require sitter at bedside if worsens overnight   DVT prophylaxis: SCD Code Status: Full code Family Communication: No family at bedside Disposition Plan:   Status is: Inpatient  Remains inpatient appropriate because:Hemodynamically unstable, Ongoing diagnostic testing needed not appropriate for outpatient work up and Inpatient level of care appropriate due to severity of illness   Dispo: The patient is from: Home              Anticipated d/c is to: Home              Anticipated d/c date is: 2 days              Patient currently is not medically stable to d/c.  Transfused 2 unit packed red blood cells today, ERCP this morning    Consultants:   GI  Procedures:   ERCP 5/7  Antimicrobials:  Anti-infectives (From admission, onward)   Start     Dose/Rate Route Frequency Ordered Stop   04/10/2020 2000  [MAR Hold]  ciprofloxacin (CIPRO) IVPB 400 mg     (MAR Hold since Fri 04/05/20 at [place].Hold Reason: Transfer to a Procedural area.)   400 mg 200 mL/hr over 60 Minutes Intravenous Every 12 hours 03/21/2020 0812     04/01/2020 1500  [MAR Hold]  metroNIDAZOLE (FLAGYL) IVPB 500 mg     (  MAR Hold since Fri 04/13/2020 at 0952.Hold Reason: Transfer to a Procedural area.)   500 mg 100 mL/hr over 60 Minutes Intravenous Every 8 hours 04/13/2020 1450     03/19/2020 0700  ciprofloxacin (CIPRO) IVPB 400 mg     400 mg 200 mL/hr over 60 Minutes Intravenous  Once 03/30/2020 0648 04/14/2020 0800        Objective: Vitals:   04/13/2020 0622 Apr 13, 2020 0726 04-13-20 0908 Apr 13, 2020 0959  BP: 117/60 (!) 110/59 129/62 (!) 124/47  Pulse: 97 96  92 95  Resp: 20 16 16 16   Temp: 97.7 F (36.5 C) 98.3 F (36.8 C) 98.6 F (37 C) 98.4 F (36.9 C)  TempSrc: Oral Oral Oral Oral  SpO2:  96% 98% 97%  Weight:      Height:        Intake/Output Summary (Last 24 hours) at Apr 13, 2020 1300 Last data filed at 04/13/20 0911 Gross per 24 hour  Intake 2787.99 ml  Output 1 ml  Net 2786.99 ml   Filed Weights   04/14/2020 0349  Weight: 67.6 kg    Examination:  General exam: Appears calm but confused, hand mittens in place due to patient pulling out IVs  Respiratory system: Clear to auscultation. Respiratory effort normal. No respiratory distress. No conversational dyspnea.  Cardiovascular system: S1 & S2 heard, RRR. No murmurs. No pedal edema. Gastrointestinal system: Abdomen is nondistended, soft and mildly TTP RUQ  Central nervous system: Alert  Extremities: Symmetric in appearance  Skin: No rashes, lesions or ulcers on exposed skin  Psychiatry: Dementia   Data Reviewed: I have personally reviewed following labs and imaging studies  CBC: Recent Labs  Lab 03/30/2020 0358 03/28/2020 1400 04/13/2020 1708 13-Apr-2020 0034  WBC 8.0  --   --  12.5*  NEUTROABS 7.6  --   --   --   HGB 10.4* 9.2* 9.4* 6.3*  HCT 32.1* 27.9* 29.6* 19.1*  MCV 107.4*  --   --  106.7*  PLT 160  --   --  110*   Basic Metabolic Panel: Recent Labs  Lab 03/22/2020 0358 Apr 13, 2020 0034  NA 142 140  K 4.0 3.8  CL 111 112*  CO2 19* 18*  GLUCOSE 149* 213*  BUN 33* 47*  CREATININE 1.34* 1.57*  CALCIUM 8.0* 7.3*   GFR: Estimated Creatinine Clearance: 31 mL/min (A) (by C-G formula based on SCr of 1.57 mg/dL (H)). Liver Function Tests: Recent Labs  Lab 04/07/2020 0358 13-Apr-2020 0034  AST 978* 221*  ALT 624* 253*  ALKPHOS 275* 135*  BILITOT 2.7* 2.1*  PROT 5.5* 3.9*  ALBUMIN 2.8* 1.8*   Recent Labs  Lab 04/17/2020 0358  LIPASE 70*   Recent Labs  Lab 04/07/2020 0512  AMMONIA 18   Coagulation Profile: Recent Labs  Lab 04/17/2020 0358  INR 1.2   Cardiac  Enzymes: No results for input(s): CKTOTAL, CKMB, CKMBINDEX, TROPONINI in the last 168 hours. BNP (last 3 results) No results for input(s): PROBNP in the last 8760 hours. HbA1C: No results for input(s): HGBA1C in the last 72 hours. CBG: Recent Labs  Lab 04-13-20 0018 04/13/2020 0724  GLUCAP 208* 203*   Lipid Profile: No results for input(s): CHOL, HDL, LDLCALC, TRIG, CHOLHDL, LDLDIRECT in the last 72 hours. Thyroid Function Tests: No results for input(s): TSH, T4TOTAL, FREET4, T3FREE, THYROIDAB in the last 72 hours. Anemia Panel: Recent Labs    03/20/2020 1708  VITAMINB12 1,016*   Sepsis Labs: No results for input(s): PROCALCITON, LATICACIDVEN in the last 168  hours.  Recent Results (from the past 240 hour(s))  Respiratory Panel by RT PCR (Flu A&B, Covid) - Nasopharyngeal Swab     Status: None   Collection Time: 04/06/2020  6:31 AM   Specimen: Nasopharyngeal Swab  Result Value Ref Range Status   SARS Coronavirus 2 by RT PCR NEGATIVE NEGATIVE Final    Comment: (NOTE) SARS-CoV-2 target nucleic acids are NOT DETECTED. The SARS-CoV-2 RNA is generally detectable in upper respiratoy specimens during the acute phase of infection. The lowest concentration of SARS-CoV-2 viral copies this assay can detect is 131 copies/mL. A negative result does not preclude SARS-Cov-2 infection and should not be used as the sole basis for treatment or other patient management decisions. A negative result may occur with  improper specimen collection/handling, submission of specimen other than nasopharyngeal swab, presence of viral mutation(s) within the areas targeted by this assay, and inadequate number of viral copies (<131 copies/mL). A negative result must be combined with clinical observations, patient history, and epidemiological information. The expected result is Negative. Fact Sheet for Patients:  PinkCheek.be Fact Sheet for Healthcare Providers:    GravelBags.it This test is not yet ap proved or cleared by the [place] FDA and  has been authorized for detection and/or diagnosis of SARS-CoV-2 by FDA under an Emergency Use Authorization (EUA). This EUA will remain  in effect (meaning this test can be used) for the duration of the COVID-19 declaration under Section 564(b)(1) of the Act, 21 U.S.C. section 360bbb-3(b)(1), unless the authorization is terminated or revoked sooner.    Influenza A by PCR NEGATIVE NEGATIVE Final   Influenza B by PCR NEGATIVE NEGATIVE Final    Comment: (NOTE) The Xpert Xpress SARS-CoV-2/FLU/RSV assay is intended as an aid in  the diagnosis of influenza from Nasopharyngeal swab specimens and  should not be used as a sole basis for treatment. Nasal washings and  aspirates are unacceptable for Xpert Xpress SARS-CoV-2/FLU/RSV  testing. Fact Sheet for Patients: PinkCheek.be Fact Sheet for Healthcare Providers: GravelBags.it This test is not yet approved or cleared by the [place] FDA and  has been authorized for detection and/or diagnosis of SARS-CoV-2 by  FDA under an Emergency Use Authorization (EUA). This EUA will remain  in effect (meaning this test can be used) for the duration of the  Covid-19 declaration under Section 564(b)(1) of the Act, 21  U.S.C. section 360bbb-3(b)(1), unless the authorization is  terminated or revoked. Performed at [facility], [place] [address] [place], [place] [address]       Radiology Studies: CT ABDOMEN PELVIS W CONTRAST  Result Date: 04/17/2020 CLINICAL DATA:  Hematemesis EXAM: CT ABDOMEN AND PELVIS WITH CONTRAST TECHNIQUE: Multidetector CT imaging of the abdomen and pelvis was performed using the standard protocol following bolus administration of intravenous contrast. CONTRAST:  158m OMNIPAQUE IOHEXOL 300 MG/ML  SOLN COMPARISON:  07/09/2019 FINDINGS: Lower chest:   Extensive coronary calcification. Hepatobiliary: Plastic biliary stent in expected position. There is prominent intrahepatic bile duct dilatation. Vague increased enhancement in the central liver without discrete masslike finding. The gallbladder is also distended without apparent wall thickening. Sludge and/or calculi present in the gallbladder. Pancreas: Unremarkable. Spleen: Unremarkable. Adrenals/Urinary Tract: Negative adrenals. No hydronephrosis or stone. Unremarkable bladder. Stomach/Bowel: No obstruction or evident inflammation. Extensive distal colonic diverticulosis. Fluid is seen within the stomach. No overt ulceration or gastritis. Vascular/Lymphatic: No acute vascular abnormality. Extensive atheromatous plaque of the aorta and iliacs. No mass or adenopathy. Reproductive:No pathologic findings. Other: No ascites or pneumoperitoneum.  Musculoskeletal: No acute abnormalities. Transitional S1 vertebra. L5-S1 degenerative anterolisthesis, facet mediated IMPRESSION: 1. No specific explanation for hematemesis. 2. History of cholangiocarcinoma. There is gallbladder and intrahepatic bile duct dilatation despite a well-positioned biliary stent. There is gallbladder sludge without findings of cholecystitis. Electronically Signed   By: Monte Fantasia M.D.   On: 04/03/2020 05:50      Scheduled Meds: . sodium chloride   Intravenous Once  . [MAR Hold] allopurinol  100 mg Oral Q M,W,F  . [MAR Hold] darifenacin  7.5 mg Oral Daily  . [MAR Hold] finasteride  5 mg Oral Daily  . [MAR Hold] insulin aspart  0-9 Units Subcutaneous TID WC  . [MAR Hold] levothyroxine  25 mcg Oral QAC breakfast  . [MAR Hold] pantoprazole (PROTONIX) IV  40 mg Intravenous Q12H  . [MAR Hold] sodium chloride flush  3 mL Intravenous Q12H   Continuous Infusions: . sodium chloride Stopped (04/05/2020 1624)  . [MAR Hold] ciprofloxacin 400 mg (04/16/2020 2159)  . [MAR Hold] metronidazole 500 mg (13-Apr-2020 0919)     LOS: 1 day       Time spent: 35 minutes   Dessa Phi, DO [facility] 04/13/2020, 1:00 PM   Available via Epic secure chat 7am-7pm After these hours, please refer to coverage provider listed on [email or web address]

## 2020-04-19 NOTE — Significant Event (Signed)
Rapid Response Event Note  Overview: hypotension 78/40, 80/41  Initial Focused Assessment: Reported by nursing staff pt is hypotensive 70s-80 SBP. CBG 208. Upon arrival, pt somulent, diaphoretic, pale, cap refill greater than 3 secs. No reported recent obvious blood loss. Abd soft nontender.  HR 101 ST, 80/41(52), RR 26 with sats 97% on RA. NS bolus 500cc ordered and initiated per standing orders.   Interventions: -NS 500 cc bolus -STAT CBC  Plan of Care (if not transferred): -Notify primary svc of events and further orders -Frequent VS q15 mins x4, then q1 x2 -Monitor for blood loss -Notify primary svc and/or RRRN for further assistance  Event Summary: Call received 0017 Arrived 0017 Call ended 0037  Madelynn Done

## 2020-04-19 NOTE — Progress Notes (Signed)
Pt arrived to unit from ENDO. 7.5 ETT in place at 23cm at the lip. ETT secured with pink tape at this time. Pt placed on PRVC 490 (8cc/kg) 16/100%/+5. RT will continue to monitor.

## 2020-04-19 NOTE — H&P (Signed)
GASTROENTEROLOGY PROCEDURE H&P NOTE   Primary Care Physician: [name], MD  HPI: Ricardo Washington is a 85 y.o. male who presents for EGD/ERCP for concern of abnormal LFTs and possible biliary occlusion and also significant blood loss anemia and elevated BUN.  Past Medical History:  Diagnosis Date  . BPH with obstruction/lower urinary tract symptoms 10/27/2015   Dr. Karsten Ro  . Cataract   . Cholangiocarcinoma ([place]) 2020   Mass detected 04/2018.  Intrahepatic cholangiocarcinoma of R hepatic lobe; stage 1A, cT1aN0M0->not candidate for intervention or chemo, Dr. Mammie Lorenzo finished 08/2019.  Biliary stent temporary->to be replaced by metal stent 10/2019.  Marland Kitchen Colon wall thickening 04/2018   "mass-like" per radiologist interpretation; colonoscopy as next step---f/u colonoscopy showed 'tics but o/w normal.  NO further colonoscopies needed due to age.  . Coronary artery disease    with preserved LV function.  . Dementia ([employer])   . Diabetes mellitus without complication ([place])   . Diverticulosis of colon    severe, entire colon  . Dysphagia 11/2018   Barium swallow: mild esophageal dysmotility.  . Ectatic abdominal aorta ([place]) 01/2017   Abd u/s: 2.9 cm aortic ectasia--at risk for aneurism development.  Recheck aortic u/s 5 yrs.  . Erectile dysfunction due to arterial insufficiency   . Fatigue   . Gout    always 2nd toe L foot (uric acid 6.20 Dec 2014 per old records)  . History of adenomatous polyp of colon 2002  . History of stomach ulcers   . Hyperlipidemia   . Hypertension   . Hypogonadism male   . Hypothyroidism 09/2019   started low dose synthroid 10/05/19->TSH normalized 11/2019  . Klebsiella sepsis ([place]) 01/2017   due to acute biliary tract infection (no stones) and acute diverticulitis.  . Macrocytic anemia 01/2017   vit B12 borderline low and iron borderline low: checking hemoccults and starting vit B12 PO and iron PO as of 02/11/17.  Vit B12 and folate normal as of GI  f/u 06/2018.  . Myogenic ptosis of bilateral eyelids 2018   Plastic surgery in [place], [place] to do surg as of 03/2017.  Marland Kitchen NASH (nonalcoholic steatohepatitis) 06/2017   LFTs up, abd u/s showed fatty liver but no other abnormality.  . Osteoarthritis of right shoulder 09/2018   Near end-stage --->glenohumeral joint-->intra-articular steroid injection 09/2018 (Dr. Delilah Shan).  11/2018-->end stage, but not ready for total shoulder replacement.  . Past use of tobacco    quit 1984  . Rectal bleeding   . Rosacea   . S/P coronary artery bypass graft x 3   . Urine incontinence    Dr. Karsten Ro   Past Surgical History:  Procedure Laterality Date  . BILIARY BRUSHING  06/26/2019   PATH: invasive adenocarcinoma.  Procedure: BILIARY BRUSHING;  Surgeon: Rush Landmark Telford Nab., MD;  Location: [place];  Service: Gastroenterology;;  . BILIARY DILATION  06/26/2019   Procedure: BILIARY DILATION;  Surgeon: Irving Copas., MD;  Location: [place];  Service: Gastroenterology;;  . BILIARY DILATION  11/18/2019   Procedure: BILIARY DILATION;  Surgeon: Irving Copas., MD;  Location: [facility];  Service: Gastroenterology;;  . BILIARY STENT PLACEMENT  06/26/2019   Procedure: BILIARY STENT PLACEMENT;  Surgeon: Irving Copas., MD;  Location: [facility] [place];  Service: Gastroenterology;;  . BILIARY STENT PLACEMENT N/A 11/18/2019   Procedure: BILIARY STENT PLACEMENT;  Surgeon: Irving Copas., MD;  Location: [facility];  Service: Gastroenterology;  Laterality: N/A;  . CARDIOVASCULAR STRESS TEST  08/28/2016  Low risk myoview, normal EF, no ischemia.  Marland Kitchen CATARACT EXTRACTION, BILATERAL    . COLONOSCOPY  2002, 2006, 02/25/2009; 06/11/18   Polyp x 1 2002, none 2006 or 2010.  Adenomatous polyp 06/11/18+  signif diverticulosis.  NO FURTHER COLONOSCOPIES DUE TO AGE.  Marland Kitchen CORONARY ARTERY BYPASS GRAFT  06/2009   descending,saphenous vein graft to first obtuse marginal, sequential saphenous vein graft to  posterior descending and posterolateral  . Endoscopic vein harvest right thigh    . ERCP N/A 06/26/2019   Procedure: ENDOSCOPIC RETROGRADE CHOLANGIOPANCREATOGRAPHY (ERCP);  Surgeon: Irving Copas., MD;  Location: [facility];  Service: Gastroenterology;  Laterality: N/A;  with stent   . ERCP N/A 11/18/2019   Procedure: ENDOSCOPIC RETROGRADE CHOLANGIOPANCREATOGRAPHY (ERCP);  Surgeon: Irving Copas., MD;  Location: [facility];  Service: Gastroenterology;  Laterality: N/A;  . IR RADIOLOGIST EVAL & MGMT  06/12/2018  . IR RADIOLOGIST EVAL & MGMT  09/18/2018  . IR RADIOLOGIST EVAL & MGMT  05/26/2019  . IR RADIOLOGIST EVAL & MGMT  07/29/2019  . PTCA    . REMOVAL OF STONES  06/26/2019   Procedure: REMOVAL OF STONES;  Surgeon: Rush Landmark Telford Nab., MD;  Location: [place];  Service: Gastroenterology;;  . REMOVAL OF STONES  11/18/2019   Procedure: REMOVAL OF STONES;  Surgeon: Irving Copas., MD;  Location: [facility];  Service: Gastroenterology;;  . Joan Mayans  06/26/2019   Procedure: Joan Mayans;  Surgeon: Irving Copas., MD;  Location: [place];  Service: Gastroenterology;;  . Lavell Islam REMOVAL  11/18/2019   Procedure: STENT REMOVAL;  Surgeon: Irving Copas., MD;  Location: [facility];  Service: Gastroenterology;;  . TONSILLECTOMY    . UMBILICAL HERNIA REPAIR     2009  . VASECTOMY     Current Facility-Administered Medications  Medication Dose Route Frequency Provider Last Rate Last Admin  . 0.9 %  sodium chloride infusion   Intravenous Continuous Norval Morton, MD   Stopped at 04/06/2020 1624  . [MAR Hold] albuterol (PROVENTIL) (2.5 MG/3ML) 0.083% nebulizer solution 2.5 mg  2.5 mg Nebulization Q6H PRN Norval Morton, MD      . Doug Sou Hold] allopurinol (ZYLOPRIM) tablet 100 mg  100 mg Oral Q M,W,F Smith, Rondell A, MD      . Doug Sou Hold] ciprofloxacin (CIPRO) IVPB 400 mg  400 mg Intravenous Q12H Smith, Rondell A, MD 200 mL/hr at 04/18/2020 2159  400 mg at 03/28/2020 2159  . [MAR Hold] darifenacin (ENABLEX) 24 hr tablet 7.5 mg  7.5 mg Oral Daily Fuller Plan A, MD      . Doug Sou Hold] finasteride (PROSCAR) tablet 5 mg  5 mg Oral Daily Tamala Julian, Rondell A, MD   5 mg at 04/10/2020 1902  . [MAR Hold] insulin aspart (novoLOG) injection 0-9 Units  0-9 Units Subcutaneous TID WC Norval Morton, MD   3 Units at 04-06-20 [phone]  . [MAR Hold] levothyroxine (SYNTHROID) tablet 25 mcg  25 mcg Oral QAC breakfast Fuller Plan A, MD   25 mcg at 04/06/2020 0513  . [MAR Hold] metroNIDAZOLE (FLAGYL) IVPB 500 mg  500 mg Intravenous Q8H Smith, Rondell A, MD 100 mL/hr at Apr 06, 2020 0919 500 mg at 2020-04-06 0919  . [MAR Hold] ondansetron (ZOFRAN) tablet 4 mg  4 mg Oral Q6H PRN Norval Morton, MD       Or  . Doug Sou Hold] ondansetron (ZOFRAN) injection 4 mg  4 mg Intravenous Q6H PRN Norval Morton, MD      . Doug Sou Hold]  pantoprazole (PROTONIX) injection 40 mg  40 mg Intravenous Q12H Vena Rua, PA-C   40 mg at 04/05/2020 2120  . [MAR Hold] sodium chloride flush (NS) 0.9 % injection 3 mL  3 mL Intravenous Q12H Smith, Rondell A, MD   3 mL at 04/09/2020 2111  . [MAR Hold] triamcinolone ointment (KENALOG) 0.5 %   Topical Daily PRN Norval Morton, MD       Allergies  Allergen Reactions  . Sulfonamide Derivatives Nausea Only   Family History  Problem Relation Age of Onset  . Cancer Mother   . Cancer Father        pt points to LLQ as  area of cancer, so potentially could have been intestinal.     . Colon cancer Neg Hx   . Esophageal cancer Neg Hx   . Stomach cancer Neg Hx   . Rectal cancer Neg Hx    Social History   Socioeconomic History  . Marital status: Married    Spouse name: Not on file  . Number of children: 3  . Years of education: 37  . Highest education level: Not on file  Occupational History  . Not on file  Tobacco Use  . Smoking status: Former Smoker    Packs/day: 1.00    Years: 30.00    Pack years: 30.00    Types: Cigarettes    Quit date:  03/15/1983    Years since quitting: 37.0  . Smokeless tobacco: Never Used  . Tobacco comment: Quit 1984  Substance and Sexual Activity  . Alcohol use: Not Currently    Alcohol/week: 5.0 - 7.0 standard drinks    Types: 5 - 7 Glasses of wine per week    Comment: quit etoh 1999 but in ~ 2017 began having a glass of wine with dinner   . Drug use: No  . Sexual activity: Never  Other Topics Concern  . Not on file  Social History Narrative   Married 1957, has 3 sons.   [place]. 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    End of life Care: DNR, no prolonged heroic measures or prolonged supportive care.    Former smoker, quit 1984.   Quit alcohol when dx'd with DM in 2009. ~ 2017 began having glass of wine with dinner.   No exercise.   Social Determinants of Health   Financial Resource Strain:   . Difficulty of Paying Living Expenses:   Food Insecurity:   . Worried About Charity fundraiser in the Last Year:   . Arboriculturist in the Last Year:   Transportation Needs: No Transportation Needs  . Lack of Transportation (Medical): No  . Lack of Transportation (Non-Medical): No  Physical Activity:   . Days of Exercise per Week:   . Minutes of Exercise per Session:   Stress:   . Feeling of Stress :   Social Connections:   . Frequency of Communication with Friends and Family:   . Frequency of Social Gatherings with Friends and Family:   . Attends Religious Services:   . Active Member of Clubs or Organizations:   . Attends Archivist Meetings:   Marland Kitchen Marital Status:   Intimate Partner Violence:   . Fear of Current or Ex-Partner:   . Emotionally Abused:   Marland Kitchen Physically Abused:   . Sexually Abused:     Physical Exam: Vital signs in last 24 hours: Temp:  [97.6  F (36.4 C)-98.7 F (37.1 C)] 98.4 F (36.9 C) (05/07 0959) Pulse Rate:  [61-101] 95 (05/07 0959) Resp:  [16-27] 16 (05/07 0959) BP:  (71-129)/(41-62) 124/47 (05/07 0959) SpO2:  [96 %-100 %] 97 % (05/07 0959)   GEN: NAD EYE: Sclerae anicteric ENT: Dry MM CV: Non-tachycardic GI: Soft, mild TTP in midabdomen NEURO:  Alert & Oriented x 1 (person)  Lab Results: Recent Labs    03/23/2020 0358 04/15/2020 0358 04/08/2020 1400 04/06/2020 1708 04-04-2020 0034  WBC 8.0  --   --   --  12.5*  HGB 10.4*   < > 9.2* 9.4* 6.3*  HCT 32.1*   < > 27.9* 29.6* 19.1*  PLT 160  --   --   --  120*   < > = values in this interval not displayed.   BMET Recent Labs    03/30/2020 0358 2020/04/04 0034  NA 142 140  K 4.0 3.8  CL 111 112*  CO2 19* 18*  GLUCOSE 149* 213*  BUN 33* 47*  CREATININE 1.34* 1.57*  CALCIUM 8.0* 7.3*   LFT Recent Labs    Apr 04, 2020 0034  PROT 3.9*  ALBUMIN 1.8*  AST 221*  ALT 253*  ALKPHOS 135*  BILITOT 2.1*  BILIDIR 1.5*  IBILI 0.6   PT/INR Recent Labs    03/23/2020 0358  LABPROT 15.0  INR 1.2     Impression / Plan: This is a 85 y.o.male who presents for EGD/ERCP for concern of abnormal LFTs and possible biliary occlusion and also significant blood loss anemia and elevated BUN.  The risks of an ERCP were discussed at length, including but not limited to the risk of perforation, bleeding, abdominal pain, post-ERCP pancreatitis (while usually mild can be severe and even life threatening).  The risks and benefits of endoscopic evaluation were discussed with the patient; these include but are not limited to the risk of perforation, infection, bleeding, missed lesions, lack of diagnosis, severe illness requiring hospitalization, as well as anesthesia and sedation related illnesses.  The patient's wife is agreeable for [place] to proceed.    Justice Britain, MD [facility] Advanced [facility]oscopy Office # [phone]

## 2020-04-19 NOTE — Anesthesia Procedure Notes (Signed)
Arterial Line Insertion Start/EndMay 27, 2021 1:55 PM, 04/14/2020 1:55 PM Performed by: CRNA  Patient location: O[facility] procedure area. Preanesthetic checklist: patient identified, IV checked, site marked, risks and benefits discussed, surgical consent, monitors and equipment checked, pre-op evaluation, timeout performed and anesthesia consent Left, radial was placed Catheter size: 20 G Hand hygiene performed , maximum sterile barriers used  and Seldinger technique used Allen's test indicative of satisfactory collateral circulation Attempts: 1 Procedure performed without using ultrasound guided technique. Following insertion, dressing applied and Biopatch. Post procedure assessment: decreased circulation  Patient tolerated the procedure well with no immediate complications.

## 2020-04-19 NOTE — Transfer of Care (Signed)
Immediate Anesthesia Transfer of Care Note  Patient: Ricardo Washington  Procedure(s) Performed: ENDOSCOPIC RETROGRADE CHOLANGIOPANCREATOGRAPHY (ERCP) WITH PROPOFOL (N/A ) ESOPHAGOGASTRODUODENOSCOPY (EGD) WITH PROPOFOL (N/A )  Patient Location: 2H ICU  Anesthesia Type:General  Level of Consciousness: Patient remains intubated per anesthesia plan  Airway & Oxygen Therapy: Patient remains intubated per anesthesia plan and Patient placed on Ventilator (see vital sign flow sheet for setting)  Post-op Assessment: Report given to RN and Post -op Vital signs reviewed and stable  Post vital signs: Reviewed and stable  Last Vitals:  Vitals Value Taken Time  BP    Temp    Pulse    Resp    SpO2      Last Pain:  Vitals:   18-Apr-2020 0959  TempSrc: Oral  PainSc: 0-No pain         Complications: No apparent anesthesia complications

## 2020-04-19 NOTE — Op Note (Signed)
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), Biliary dilation on magnetic                            resonance imaging, Abnormal liver function test,                            Stent change, Prior Endoscopic Retrograde                            Cholangiopancreatography Providers:                Justice Britain, MD, Burtis Junes, RN, Clyde Lundborg, RN, Lazaro Arms, Technician Referring MD:             Thornton Park MD, MD, [facility] Medicines:                General Anesthesia, Indomethacin 100 mg PR,                            Glucagon 1 mg IV Complications:            Hypotension Estimated Blood Loss:     Estimated blood loss: Procedure:                Pre-Anesthesia Assessment:                           - Prior to the procedure, a History and Physical                            was performed, and patient medications and                            allergies were reviewed. The patient's tolerance of                            previous anesthesia was also reviewed. The risks                            and benefits of the procedure and the sedation                            options and risks were discussed with the patient.                            All questions were answered,  and informed consent                            was obtained. Prior Anticoagulants: The patient has                            taken no previous anticoagulant or antiplatelet                            agents except for aspirin. ASA Grade Assessment: IV                            - A patient with severe systemic disease that is a                             constant threat to life. After reviewing the risks                            and benefits, the patient was deemed in                            satisfactory condition to undergo the procedure.                           After obtaining informed consent, the scope was                            passed under direct vision. Throughout the                            procedure, the patient's blood pressure, pulse, and                            oxygen saturations were monitored continuously. The                            [id number]-[id number] ([id number] Olympus therapeutic                            gastroscope was introduced through the mouth, and                            used to inject contrast into and used to inject                            contrast into the bile duct. The [id number]-[id number]                            ([id number]) [place] was introduced                            through the mouth, and used to inject contrast into  and used to inject contrast into the bile duct. The                            ERCP was technically difficult and complex due to                            excessive bleeding. Successful completion of the                            procedure was aided by performing the maneuvers                            documented (below) in this report. The patient                            tolerated the procedure poorly due to the patient's                            cardiovascular instability (hypotension). Scope In: Scope Out: Findings:      The scout film was normal. The esophagus was successfully intubated       under direct vision without detailed examination of the pharynx, larynx,       and associated structures.      A therapeutic esophagogastroduodenoscopy scope was used for the       examination of the upper gastrointestinal tract. The scope was passed       under direct vision through the upper GI tract. No gross lesions were       noted in  the entire esophagus. Clotted blood was found in the entire       examined stomach. Suction via Endoscope of greater than 1.7L of       blood/hematin/clot occured with still some blood clots in fundus so       complete visualization was not possible. Red blood was found in the       entire duodenum. Two stents which had been placed through the major       papilla into the biliary tree were no longer visible. They had migrated       out of the duct. Overt hemobilia was coming from the major papilla. A       biliary sphincterotomy had been performed. The sphincterotomy appeared       open.      After losing position on multiple occasions due to the deformity of his       stomach, in a semi-long position, I was able to get a short 0.035 inch       Soft Jagwire into the biliary tree. The short-nosed traction       sphincterotome was passed over the guidewire and the bile duct was then       deeply cannulated. Contrast was injected. I personally interpreted the       bile duct images. Ductal flow of contrast was adequate. Image quality       was adequate. Contrast extended to the hepatic ducts. Opacification of       the entire biliary tree except for the cystic duct and gallbladder was       successful. The entire opacified area contained filling defects thought  to be sludge/clots. The left and right hepatic ducts with secondary or       tertiary branches of the intrahepatic ducts (Bismuth IV) contained       multiple severe segmental stenoses. The left intrahepatic branches were       moderately dilated, secondary to aforementioned Bismuth stricturing from       known cholangiocarcinoma.      The biliary tree was swept with a retrieval balloon on multiple       occasions starting at the bifurcation. Significant amount of       clots/debris/sludge were swept from the duct. Slow ooze hemobilia was       noted. In order to attempt placement of a UCSEMS and possible RFA Habib,       the  left main hepatic duct stricture was successfully dilated with a       Hurricane 6 mm balloon dilator as well as the CHD strictured region.      I had planned to attempt to place another wire into the the right       biliary system alongside the previously placed wire that was in the left       system, to attempt UCSEMS placement and Habib RFA.      His blood pressure began to drop, significant hemobilia was noted       thereafter. We lost access and positioning. Within moments, I got back       into position and was able to pass a wire back into the biliary tree, in       effort of quickly trying to place UCSEMS, however, patient's blood       pressure dropped into the 40-50s and he became progressive more       tachycardic, even though we had already initiated another unit of pRBCs       as well as pressor support and albumin prior to starting his procedure.       Blood was ordered as well as FFP and PLTs. Decision made to end case       after discussion with Anesthesia assistance to help with CVL placement,       A-Line placement in efforts of trying to resuscitate patient and try to       get him to IR.      The endoscopes were removed from the patient and the case was concluded. Impression:               - No gross lesions in esophagus.                           - Clotted blood in the entire stomach. This was                            suctioned but no active bleeding noted in stomach                            but could not clear fundus.                           - Blood in the entire examined duodenum.                           - Prior  biliary sphincterotomy appeared open.                           - The previously placed stents had migrated out of                            the biliary tree.                           - Hemobilia was found.                           - The fluroscopic examination was suspicious for                            sludge/clots.                           -  Multiple severe segmental biliary strictures were                            found in the hepatic duct system (Bismuth IV) -                            consistent with his known Cholangiocarcinoma. The                            strictures were malignant appearing.                           - The left and right intrahepatic branches were                            moderately dilated, secondary to Cholangiocarcinoma.                           - The biliary tree was swept and clots, debris and                            sludge were found.                           - Hemobilia persisted.                           - In effort of trying to place UCSEMS and then                            pursuing RFA Habib, the left main hepatic duct                            stricture was successfully dilated. Hemobilia                            increased and patient became progressive more  hypotensive.                           - Did not have time to further consider RFA or                            Habib, and needed urgent resuscitation. Case was                            concluded. Recommendation:           - The patient will be observed post-procedure,                            until all discharge criteria are met.                           - NPO.                           - Send patient to ICU for observation and                            resuscitation efforts.                           - Updated family (wife and son during and after                            procedure).                           - Case discussed with IR for consideration of                            Angiography for possible embolization of the                            Cholangiocarcinoma bleeding - they will be on                            Standby when resuscitation is performed.                           - Medical service made aware of the patient's                            clinical status during  the procedure.                           - Appreciate Critical Care service evaluation for                            attempt at resuscitation prior to IR intervention.                           - Greatly appreciate  Anesthesia team for their care                            throughout the case and immediately upon cessation                            for optimization of resuscitation efforts.                           - The findings and recommendations were discussed                            with the patient.                           - The findings and recommendations were discussed                            with the patient's family.                           - The findings and recommendations were discussed                            with the referring physician. Procedure Code(s):        --- Professional ---                           [phone], Endoscopic retrograde                            cholangiopancreatography (ERCP); with removal of                            calculi/debris from biliary/pancreatic duct(s) Diagnosis Code(s):        --- Professional ---                           K92.2, Gastrointestinal hemorrhage, unspecified                           T85.520A, Displacement of bile duct prosthesis,                            initial encounter                           K83.1, Obstruction of bile duct                           K83.9, Disease of biliary tract, unspecified                           R94.5, Abnormal results of liver function studies                           Z46.59, Encounter for fitting and adjustment of  other gastrointestinal appliance and device                           Z98.890, Other specified postprocedural states CPT copyright 2019 [employer]. All rights reserved. The codes documented in this report are preliminary and upon coder review may  be revised to meet current compliance requirements. Justice Britain,  MD 2020-04-19 2:49:31 PM Number of Addenda: 0

## 2020-04-19 NOTE — Anesthesia Preprocedure Evaluation (Addendum)
Anesthesia Evaluation  Patient identified by MRN, date of birth, ID band Patient awake    Reviewed: Allergy & Precautions, NPO status , Patient's Chart, lab work & pertinent test results  Airway Mallampati: I  TM Distance: >3 FB Neck ROM: Full    Dental  (+) Teeth Intact, Dental Advisory Given   Pulmonary former smoker,    Pulmonary exam normal        Cardiovascular hypertension, Pt. on medications + CAD and + CABG   Rhythm:Regular Rate:Normal     Neuro/Psych PSYCHIATRIC DISORDERS Dementia    GI/Hepatic GERD  Medicated,(+) Hepatitis -  Endo/Other  diabetes, Type 2, Oral Hypoglycemic AgentsHypothyroidism   Renal/GU Renal disease     Musculoskeletal   Abdominal Normal abdominal exam  (+)   Peds  Hematology   Anesthesia Other Findings   Reproductive/Obstetrics                            Anesthesia Physical Anesthesia Plan  ASA: III  Anesthesia Plan: General   Post-op Pain Management:    Induction: Intravenous  PONV Risk Score and Plan: 1 and Ondansetron  Airway Management Planned: Oral ETT  Additional Equipment: None  Intra-op Plan:   Post-operative Plan: Extubation in OR  Informed Consent: I have reviewed the patients History and Physical, chart, labs and discussed the procedure including the risks, benefits and alternatives for the proposed anesthesia with the patient or authorized representative who has indicated his/her understanding and acceptance.     Consent reviewed with POA  Plan Discussed with: CRNA  Anesthesia Plan Comments: (Discussed anesthetic with wife in detail. Discussed risk of anesthesia and procedure for her high risk husband. She is aware and would like to proceed.   Perfusion Scan:   Nuclear stress EF: 58%.  There was no ST segment deviation noted during stress.  Defect 1: There is a small defect of moderate severity present in the basal  inferolateral location.  Defect 2: There is a small defect of moderate severity present in the apex location. )     Anesthesia Quick Evaluation

## 2020-04-19 NOTE — Progress Notes (Signed)
Patient seen yesterday in consultation for hematemesis, abdominal pain  and abnormal liver tests.  He has cholangiocarcinoma, status post biliary stenting.  His hemoglobin had declined from 10.4 to baseline of around 12.7.  Around midnight patient became hypotensive, diaphoretic.  Rapid response was called.  He was given 500 cc bolus of fluid with improvement in hypotension.  CBC at 12:30 AM showed a significant decline in hemoglobin, down to 6.3 .  No overt bleeding associated with the decline in hemoglobin .  Patient has just completed his second unit of PRBCs.  Still no evidence for any upper GI bleeding this morning.  His vital signs remained stable through the night.  Patient is alert, he is asking timing of endoscopic procedures today.  He has no abdominal pain.  No nausea.   Asked RN to check the 2-hour posttransfusion CBC at 10 AM.  Needs to be a little bit earlier as his procedure is on 1030 I think.

## 2020-04-19 NOTE — Progress Notes (Signed)
Chaplain responded to a page for a chaplain. The chaplain prayed with the family as care was withdrawn.  Brion Aliment Chaplain Resident For questions concerning this note please contact me by pager [phone]

## 2020-04-19 NOTE — Anesthesia Procedure Notes (Signed)
Central Venous Catheter Insertion Performed by: Effie Berkshire, MD, anesthesiologist Start/End2021/05/26 1:45 PM, 13-Apr-2020 1:55 PM Patient location: Pre-op. Preanesthetic checklist: patient identified, IV checked, site marked, risks and benefits discussed, surgical consent, monitors and equipment checked, pre-op evaluation, timeout performed and anesthesia consent Position: Trendelenburg Lidocaine 1% used for infiltration and patient sedated Hand hygiene performed , maximum sterile barriers used  and Seldinger technique used Catheter size: 9 Fr Total catheter length 10. Central line was placed.MAC introducer Swan type:thermodilution PA Cath depth:50 Procedure performed using ultrasound guided technique. Ultrasound Notes:anatomy identified, needle tip was noted to be adjacent to the nerve/plexus identified, no ultrasound evidence of intravascular and/or intraneural injection and image(s) printed for medical record Attempts: 1 Following insertion, line sutured and dressing applied. Post procedure assessment: blood return through all ports, free fluid flow and no air  Patient tolerated the procedure well with no immediate complications.

## 2020-04-19 NOTE — Death Summary Note (Signed)
DEATH SUMMARY   Patient Details  Name: Ricardo Washington MRN: [id number] DOB: [birth year]  Admission/Discharge Information   Admit Date:  04/14/20  Date of Death: Date of Death: 04/15/2020  Time of Death: Time of Death: 04-Feb-1704  Length of Stay: 1  Referring Physician: [name], MD   Reason(s) for Hospitalization  Hematemesis with nausea, acute blood loss anemia, hypotension  Diagnoses  Preliminary cause of death:  Hypovolemic shock due to active bleed  Secondary Diagnoses (including complications and co-morbidities):  Principal Problem:   Hematemesis with nausea Active Problems:   BPH (benign prostatic hyperplasia)   Diabetes mellitus, type 2 ([place])   BPH with obstruction/lower urinary tract symptoms   LFT elevation   Acute kidney injury ([place])   Alzheimer disease ([place])   Cholangiocarcinoma ([place])   Macrocytic anemia   Biliary obstruction   Hyperbilirubinemia   Demand ischemia ([place])   Hypothyroidism   GERD (gastroesophageal reflux disease)   Acute blood loss anemia   Hemobilia   Hypotension   Hypovolemic shock ([place])   Brief Hospital Course (including significant findings, care, treatment, and services provided and events leading to death)  Ricardo Washington is an 85 y.o.malewith medical history significant ofintrahepatic cholangiocarcinoma followed by Ricardo Washington, diabetes mellitus type 2, hypothyroidism, and Alzheimer's dementia who presented with complaints of vomiting blood starting around 1 AM 2020/04/14. History is obtained from the patient's wife due to his history of dementia. All day he had been complaining of severe pain in the middle of his back where the second stent was placed by Ricardo Washington in December 2020. Since his stent was placed, patient was noted to have intermittent complaints of pain that was short lasting. CT scan of the abdomen and pelvis noted gallbladder and intrahepatic biliary duct dilatation despite correct position of biliary stent. GI  consulted. He was started on empiric IV antibiotics. Overnight 5/6-5/7, rapid response was called due to hypotension. He was noted to have anemia and was ordered 2 unit packed red blood cells with improvement in his blood pressure.  He also had episodes of agitation, confusion requiring mittens for patient safety due to underlying dementia. He underwent ERCP 04/16/2023. During the procedure, significant hemobilia persisted and patient became hypotensive. He was urgently resuscitated and required art line placement, pressor support, blood product transfusions. He was transferred to ICU where family decided to transition him to DNR status and comfort care.    Pertinent Labs and Studies  Significant Diagnostic Studies CT ABDOMEN PELVIS W CONTRAST  Result Date: 04/14/2020 CLINICAL DATA:  Hematemesis EXAM: CT ABDOMEN AND PELVIS WITH CONTRAST TECHNIQUE: Multidetector CT imaging of the abdomen and pelvis was performed using the standard protocol following bolus administration of intravenous contrast. CONTRAST:  180m OMNIPAQUE IOHEXOL 300 MG/ML  SOLN COMPARISON:  07/09/2019 FINDINGS: Lower chest:  Extensive coronary calcification. Hepatobiliary: Plastic biliary stent in expected position. There is prominent intrahepatic bile duct dilatation. Vague increased enhancement in the central liver without discrete masslike finding. The gallbladder is also distended without apparent wall thickening. Sludge and/or calculi present in the gallbladder. Pancreas: Unremarkable. Spleen: Unremarkable. Adrenals/Urinary Tract: Negative adrenals. No hydronephrosis or stone. Unremarkable bladder. Stomach/Bowel: No obstruction or evident inflammation. Extensive distal colonic diverticulosis. Fluid is seen within the stomach. No overt ulceration or gastritis. Vascular/Lymphatic: No acute vascular abnormality. Extensive atheromatous plaque of the aorta and iliacs. No mass or adenopathy. Reproductive:No pathologic findings. Other: No ascites or  pneumoperitoneum. Musculoskeletal: No acute abnormalities. Transitional S1 vertebra. L5-S1 degenerative anterolisthesis, facet mediated IMPRESSION: 1.  No specific explanation for hematemesis. 2. History of cholangiocarcinoma. There is gallbladder and intrahepatic bile duct dilatation despite a well-positioned biliary stent. There is gallbladder sludge without findings of cholecystitis. Electronically Signed   By: Monte Fantasia M.D.   On: 04/10/2020 05:50   DG ERCP BILIARY & PANCREATIC DUCTS  Result Date: Apr 17, 2020 CLINICAL DATA:  85 year old male with a history of biliary ductal dilatation EXAM: ERCP TECHNIQUE: Multiple spot images obtained with the fluoroscopic device and submitted for interpretation post-procedure. FLUOROSCOPY TIME:  Fluoroscopy Time: 7 minutes 36 seconds COMPARISON:  CT 04/12/2020 FINDINGS: Limited intraoperative fluoroscopic spot images during ERCP. Initial image demonstrates the endoscope projecting over the upper abdomen. Subsequently there is cannulation of the ampulla with a safety wire in place and partial opacification of the extrahepatic biliary ducts and intrahepatic biliary ducts. Deployment of a retrieval balloon. Deployment of a balloon catheter within the hepatic duct/intrahepatic ducts. IMPRESSION: Limited images during ERCP demonstrates deployment of a balloon retrieval catheter and PTA the intrahepatic ducts/common hepatic duct. Please refer to the dictated operative report for full details of intraoperative findings and procedure. Electronically Signed   By: Corrie Mckusick D.O.   On: 04-17-2020 15:19    Microbiology Recent Results (from the past 240 hour(s))  Respiratory Panel by RT PCR (Flu A&B, Covid) - Nasopharyngeal Swab     Status: None   Collection Time: 04/06/2020  6:31 AM   Specimen: Nasopharyngeal Swab  Result Value Ref Range Status   SARS Coronavirus 2 by RT PCR NEGATIVE NEGATIVE Final    Comment: (NOTE) SARS-CoV-2 target nucleic acids are NOT  DETECTED. The SARS-CoV-2 RNA is generally detectable in upper respiratoy specimens during the acute phase of infection. The lowest concentration of SARS-CoV-2 viral copies this assay can detect is 131 copies/mL. A negative result does not preclude SARS-Cov-2 infection and should not be used as the sole basis for treatment or other patient management decisions. A negative result may occur with  improper specimen collection/handling, submission of specimen other than nasopharyngeal swab, presence of viral mutation(s) within the areas targeted by this assay, and inadequate number of viral copies (<131 copies/mL). A negative result must be combined with clinical observations, patient history, and epidemiological information. The expected result is Negative. Fact Sheet for Patients:  PinkCheek.be Fact Sheet for Healthcare Providers:  GravelBags.it This test is not yet ap proved or cleared by the [place] FDA and  has been authorized for detection and/or diagnosis of SARS-CoV-2 by FDA under an Emergency Use Authorization (EUA). This EUA will remain  in effect (meaning this test can be used) for the duration of the COVID-19 declaration under Section 564(b)(1) of the Act, 21 U.S.C. section 360bbb-3(b)(1), unless the authorization is terminated or revoked sooner.    Influenza A by PCR NEGATIVE NEGATIVE Final   Influenza B by PCR NEGATIVE NEGATIVE Final    Comment: (NOTE) The Xpert Xpress SARS-CoV-2/FLU/RSV assay is intended as an aid in  the diagnosis of influenza from Nasopharyngeal swab specimens and  should not be used as a sole basis for treatment. Nasal washings and  aspirates are unacceptable for Xpert Xpress SARS-CoV-2/FLU/RSV  testing. Fact Sheet for Patients: PinkCheek.be Fact Sheet for Healthcare Providers: GravelBags.it This test is not yet approved or cleared  by the [place] FDA and  has been authorized for detection and/or diagnosis of SARS-CoV-2 by  FDA under an Emergency Use Authorization (EUA). This EUA will remain  in effect (meaning this test can be used) for the duration of the  Covid-19 declaration under Section 564(b)(1) of the Act, 21  U.S.C. section 360bbb-3(b)(1), unless the authorization is  terminated or revoked. Performed at [facility], [place] [address], [place], [place] [address]     Lab Basic Metabolic Panel: Recent Labs  Lab 03/20/2020 0358 2020-04-21 0034 04-21-20 1351 04/21/2020 1411  NA 142 140 141 142  K 4.0 3.8 4.6 3.9  CL 111 112*  --   --   CO2 19* 18*  --   --   GLUCOSE 149* 213*  --   --   BUN 33* 47*  --   --   CREATININE 1.34* 1.57*  --   --   CALCIUM 8.0* 7.3*  --   --    Liver Function Tests: Recent Labs  Lab 04/01/2020 0358 2020/04/21 0034  AST 978* 221*  ALT 624* 253*  ALKPHOS 275* 135*  BILITOT 2.7* 2.1*  PROT 5.5* 3.9*  ALBUMIN 2.8* 1.8*   Recent Labs  Lab 03/19/2020 0358  LIPASE 70*   Recent Labs  Lab 04/05/2020 0512  AMMONIA 18   CBC: Recent Labs  Lab 03/30/2020 0358 03/23/2020 0358 04/08/2020 1400 04/06/2020 1708 April 21, 2020 0034 04-21-2020 1351 04/21/2020 1411  WBC 8.0  --   --   --  12.5*  --   --   NEUTROABS 7.6  --   --   --   --   --   --   HGB 10.4*   < > 9.2* 9.4* 6.3* 6.1* 6.5*  HCT 32.1*   < > 27.9* 29.6*  27.0* 19.1* 18.0* 19.0*  MCV 107.4*  --   --   --  106.7*  --   --   PLT 160  --   --   --  120*  --   --    < > = values in this interval not displayed.   Cardiac Enzymes: No results for input(s): CKTOTAL, CKMB, CKMBINDEX, TROPONINI in the last 168 hours. Sepsis Labs: Recent Labs  Lab 03/26/2020 0358 04/21/2020 0034  WBC 8.0 12.5*    Procedures/Operations  ERCP  Findings:      The scout film was normal. The esophagus was successfully intubated       under direct vision without detailed examination of the pharynx, larynx,       and associated structures.       A therapeutic esophagogastroduodenoscopy scope was used for the       examination of the upper gastrointestinal tract. The scope was passed       under direct vision through the upper GI tract. No gross lesions were       noted in the entire esophagus. Clotted blood was found in the entire       examined stomach. Suction via Endoscope of greater than 1.7L of       blood/hematin/clot occured with still some blood clots in fundus so       complete visualization was not possible. Red blood was found in the       entire duodenum. Two stents which had been placed through the major       papilla into the biliary tree were no longer visible. They had migrated       out of the duct. Overt hemobilia was coming from the major papilla. A       biliary sphincterotomy had been performed. The sphincterotomy appeared       open.      After losing position on multiple  occasions due to the deformity of his       stomach, in a semi-long position, I was able to get a short 0.035 inch       Soft Jagwire into the biliary tree. The short-nosed traction       sphincterotome was passed over the guidewire and the bile duct was then       deeply cannulated. Contrast was injected. I personally interpreted the       bile duct images. Ductal flow of contrast was adequate. Image quality       was adequate. Contrast extended to the hepatic ducts. Opacification of       the entire biliary tree except for the cystic duct and gallbladder was       successful. The entire opacified area contained filling defects thought       to be sludge/clots. The left and right hepatic ducts with secondary or       tertiary branches of the intrahepatic ducts (Bismuth IV) contained       multiple severe segmental stenoses. The left intrahepatic branches were       moderately dilated, secondary to aforementioned Bismuth stricturing from       known cholangiocarcinoma.      The biliary tree was swept with a retrieval balloon on multiple        occasions starting at the bifurcation. Significant amount of       clots/debris/sludge were swept from the duct. Slow ooze hemobilia was       noted. In order to attempt placement of a UCSEMS and possible RFA Habib,       the left main hepatic duct stricture was successfully dilated with a       Hurricane 6 mm balloon dilator as well as the CHD strictured region.      I had planned to attempt to place another wire into the the right       biliary system alongside the previously placed wire that was in the left       system, to attempt UCSEMS placement and Habib RFA.      His blood pressure began to drop, significant hemobilia was noted       thereafter. We lost access and positioning. Within moments, I got back       into position and was able to pass a wire back into the biliary tree, in       effort of quickly trying to place UCSEMS, however, patient's blood       pressure dropped into the 40-50s and he became progressive more       tachycardic, even though we had already initiated another unit of pRBCs       as well as pressor support and albumin prior to starting his procedure.       Blood was ordered as well as FFP and PLTs. Decision made to end case       after discussion with Anesthesia assistance to help with CVL placement,       A-Line placement in efforts of trying to resuscitate patient and try to       get him to IR.      The endoscopes were removed from the patient and the case was concluded. Impression:               - No gross lesions in esophagus.                           -  Clotted blood in the entire stomach. This was                            suctioned but no active bleeding noted in stomach                            but could not clear fundus.                           - Blood in the entire examined duodenum.                           - Prior biliary sphincterotomy appeared open.                           - The previously placed stents had migrated out of                             the biliary tree.                           - Hemobilia was found.                           - The fluroscopic examination was suspicious for                            sludge/clots.                           - Multiple severe segmental biliary strictures were                            found in the hepatic duct system (Bismuth IV) -                            consistent with his known Cholangiocarcinoma. The                            strictures were malignant appearing.                           - The left and right intrahepatic branches were                            moderately dilated, secondary to Cholangiocarcinoma.                           - The biliary tree was swept and clots, debris and                            sludge were found.                           - Hemobilia persisted.                           -  In effort of trying to place UCSEMS and then                            pursuing RFA Habib, the left main hepatic duct                            stricture was successfully dilated. Hemobilia                            increased and patient became progressive more                            hypotensive.                           - Did not have time to further consider RFA or                            Habib, and needed urgent resuscitation. Case was                            concluded. Recommendation:           - The patient will be observed post-procedure,                            until all discharge criteria are met.                           - NPO.                           - Send patient to ICU for observation and                            resuscitation efforts.                           - Updated family (wife and son during and after                            procedure).                           - Case discussed with IR for consideration of                            Angiography for possible embolization of the                             Cholangiocarcinoma bleeding - they will be on                            Standby when resuscitation is performed.                           -  Medical service made aware of the patient's                            clinical status during the procedure.                           - Appreciate Critical Care service evaluation for                            attempt at resuscitation prior to IR intervention.                           - Greatly appreciate Anesthesia team for their care                            throughout the case and immediately upon cessation                            for optimization of resuscitation efforts.                           - The findings and recommendations were discussed                            with the patient.                           - The findings and recommendations were discussed                            with the patient's family.                           - The findings and recommendations were discussed                            with the referring physician.   Dessa Phi 03/26/2020, 7:12 AM

## 2020-04-19 NOTE — Anesthesia Postprocedure Evaluation (Signed)
Anesthesia Post Note  Patient: LYDIA MENG  Procedure(s) Performed: ENDOSCOPIC RETROGRADE CHOLANGIOPANCREATOGRAPHY (ERCP) WITH PROPOFOL (N/A ) ESOPHAGOGASTRODUODENOSCOPY (EGD) WITH PROPOFOL (N/A )     Patient location during evaluation: SICU Anesthesia Type: General Level of consciousness: sedated Pain management: pain level controlled Vital Signs Assessment: post-procedure vital signs reviewed and stable Respiratory status: patient remains intubated per anesthesia plan Cardiovascular status: stable Postop Assessment: no apparent nausea or vomiting Anesthetic complications: no Comments: Active hemorrhaging in the Endo suite. Procedure aborted. Resuscitation performed in Endo suite. Transferred to ICU. CCM transitioned patient to [facility].     Last Vitals:  Vitals:   04/18/20 0959 Apr 18, 2020 1440  BP: (!) 124/47 (!) 144/60  Pulse: 95 69  Resp: 16 16  Temp: 36.9 C   SpO2: 97% 98%    Last Pain:  Vitals:   04/18/2020 0959  TempSrc: Oral  PainSc: 0-No pain                 Effie Berkshire

## 2020-04-19 NOTE — Progress Notes (Signed)
ERCP to assess bleeding, aborted secondary to patient condition. See anesthesia notes.  Transferred directly to ICU from procedure room.

## 2020-04-19 NOTE — Progress Notes (Signed)
GI progress note  After the completion of the ERCP, our critical care providers had an opportunity to evaluate the patient. Patient was on multiple pressors as well as being transfused for massive transfusion protocol. It was felt that based on the patient's age, his complex and complicated cholangiocarcinoma that has been aggressive and obviously a source for his active source of bleeding that potential further resuscitative efforts and potential therapeutic management may not be of significant benefit in the long-term. Please see their consultation note for full details. I updated the patient's wife as well as the patient's son Ed ([phone]) about the course of the procedure as well as review of his case over the course of the last few months. Speaking with both of them, I suspect that they will not want to move forward with any further aggressive treatment understanding how his disease has been and that his cancer is progressive. They were thankful for the 9 months that we had given him since our initial ERCP back in August of last year.  I updated the anesthesia service as well. The patient is to be transferred to the ICU for further monitoring and discussion with the family as to next therapeutic steps. Based on discussion, I updated interventional radiology that it seems unlikely their care/assistance would be needed but I appreciate them being on call and available. The critical care service will discuss further management with the patient's family.  I appreciate the excellent work that the anesthesia staff as well as the GI staff did during this patient's procedure and during the resuscitative time.  GI will follow along while the patient is still in the hospital. I have updated the inpatient GI service. I will update the patient's primary gastroenterologist, Dr. [name].  Please let me know if there is anything else that I can be of assistance with.  Justice Britain, MD [place]  Gastroenterology [facility] Office # [phone]

## 2020-04-19 NOTE — H&P (Signed)
NAME:  Ricardo Washington, MRN:  [id number], DOB:  September 18, 1935, LOS: 1 ADMISSION DATE:  04/18/2020, CONSULTATION DATE:  04/02/20 REFERRING MD:  Maylene Roes, CHIEF COMPLAINT:  Intubated GI Bleed   Brief History   Ricardo Washington is a 85 y.o. male with medical history significant for intrahepatic cholangiocarcinoma followed by Dr.Zhao, diabetes mellitus type 2, hypothyroidism, and Alzheimer's dementia who presented with complaints of vomiting blood starting around 1 AM 04/10/2020. He became hemodynamically unstable , and was transfused with 2 units PRBC's and taken for ERCP 5/7/am. During the procedure he re-bled, and became hypotensive. He was intubated and on Levo, Neo and vasopressin intra-procedure. PCCM have been called to assist with management of care while patient is stabilized with plan for IR Angiogram for further intervention.   History of present illness   Ricardo Washington is a 85 y.o. male with medical history significant for intrahepatic cholangiocarcinoma followed by Dr.Zhao, diabetes mellitus type 2, hypothyroidism, and Alzheimer's dementia who presented with complaints of vomiting blood starting around 1 AM 03/23/2020. He was complaining of back pain.  CT scan of the abdomen and pelvis noted gallbladder and intrahepatic biliary duct dilatation despite correct position of biliary stent. LFT's were elevated . GI was consulted. He was seen and scheduled for ERCP as well as EGD  for concern of abnormal LFTs and possible biliary occlusion and also significant blood loss anemia and elevated BUN.   Overnight 5/6-5/7  the patient became hypotensive and diaphoretic, requiring rapid response intervention. He was given 500 cc bolus of fluid with improvement in hypotension.  CBC at 12:30 AM showed a significant decline in hemoglobin, down to 6.3 .  No overt bleeding associated with the decline in hemoglobin .  Patient  Received  2  units of PRBCs, then was taken for ERCP . The plan was to place a stent to tamponade  cholangiocarcinoma bleed.   During the procedure in Endo, patient began to bleed. He became hemodynamically unstable with BP systolic as low as in the 40's. He  Was started  on Levophed, Neo and Vasopressin at the time of this consult. He was undergoing massive transfusion protocol.  Dr. Lynetta Mare spoke with the patient's wife. It was felt that based on the patient's age, his complex and complicated cholangiocarcinoma that has been aggressive and obviously a source for his active source of bleeding that potential further resuscitative efforts and potential therapeutic management may not be of significant benefit in the long-term. We needed to know how aggressive [name]  wanted to be with care. We explained that he was unstable and could potentially bleed again. She states that she and [name] had discussed end of life , and he would not want to live with  poor  quality of life with pain. She felt comfort care at this point was the most  Appropriate plan.   We have changed [name] Code status to DNR. He will be transferred to the ICU for one way extubation . He did last receive paralytic at 13:30. We will allow 2-3 hours for this to clear prior to extubation. Pressors have been discontinued and his BP is now 035 systolic.  Past Medical History    Past Medical History:  Diagnosis Date  . BPH with obstruction/lower urinary tract symptoms 10/27/2015   Dr. Karsten Ro  . Cataract   . Cholangiocarcinoma ([place]) 2020   Mass detected 04/2018.  Intrahepatic cholangiocarcinoma of R hepatic lobe; stage 1A, cT1aN0M0->not candidate for intervention or chemo, Dr.  Moody->SBRT finished 08/2019.  Biliary stent temporary->to be replaced by metal stent 10/2019.  Marland Kitchen Colon wall thickening 04/2018   "mass-like" per radiologist interpretation; colonoscopy as next step---f/u colonoscopy showed 'tics but o/w normal.  NO further colonoscopies needed due to age.  . Coronary artery disease    with preserved LV  function.  . Dementia ([place])   . Diabetes mellitus without complication ([place])   . Diverticulosis of colon    severe, entire colon  . Dysphagia 11/2018   Barium swallow: mild esophageal dysmotility.  . Ectatic abdominal aorta ([place]) 01/2017   Abd u/s: 2.9 cm aortic ectasia--at risk for aneurism development.  Recheck aortic u/s 5 yrs.  . Erectile dysfunction due to arterial insufficiency   . Fatigue   . Gout    always 2nd toe L foot (uric acid 6.20 Dec 2014 per old records)  . History of adenomatous polyp of colon 2002  . History of stomach ulcers   . Hyperlipidemia   . Hypertension   . Hypogonadism male   . Hypothyroidism 09/2019   started low dose synthroid 10/05/19->TSH normalized 11/2019  . Klebsiella sepsis ([place]) 01/2017   due to acute biliary tract infection (no stones) and acute diverticulitis.  . Macrocytic anemia 01/2017   vit B12 borderline low and iron borderline low: checking hemoccults and starting vit B12 PO and iron PO as of 02/11/17.  Vit B12 and folate normal as of GI f/u 06/2018.  . Myogenic ptosis of bilateral eyelids 2018   Plastic surgery in [place], [place] to do surg as of 03/2017.  Marland Kitchen NASH (nonalcoholic steatohepatitis) 06/2017   LFTs up, abd u/s showed fatty liver but no other abnormality.  . Osteoarthritis of right shoulder 09/2018   Near end-stage --->glenohumeral joint-->intra-articular steroid injection 09/2018 (Dr. Delilah Shan).  11/2018-->end stage, but not ready for total shoulder replacement.  . Past use of tobacco    quit 1984  . Rectal bleeding   . Rosacea   . S/P coronary artery bypass graft x 3   . Urine incontinence    Dr. Karsten Ro    [facility] Events   04/02/2020 Admission 28-Mar-2020 ERCP  Consults:  5/7 PCCM  Procedures:  5/7/ERCP  Significant Diagnostic Tests:  04/14/2020 CT Abdomen and Pelvis with contrast No specific explanation for hematemesis. History of cholangiocarcinoma. There is gallbladder and intrahepatic bile duct dilatation  despite a well-positioned biliary stent. There is gallbladder sludge without findings of cholecystitis.  Micro Data:  03-28-20 SARS Coronavirus 2 by RT PCR NEGATIVE NEGATIVE    Influenza A by PCR NEGATIVE NEGATIVE   Influenza B by PCR NEGATIVE NEGATIVE    Antimicrobials:  Cipro 03/23/2020>> Flagyl 03/28/2020>>  Interim history/subjective:  Currently off pressors Intubated   Objective   Blood pressure (!) 124/47, pulse 95, temperature 98.4 F (36.9 C), temperature source Oral, resp. rate 16, height 5' 5"  (1.651 m), weight 67.6 kg, SpO2 97 %.        Intake/Output Summary (Last 24 hours) at Mar 28, 2020 1341 Last data filed at 2020/03/28 1326 Gross per 24 hour  Intake 3078.99 ml  Output 1 ml  Net 3077.99 ml   Filed Weights   03/26/2020 0349  Weight: 67.6 kg    Examination: General: Elderly male, critically ill,  intubated , on pressors HENT: NCAT, No LAD, ETT secure and intact Lungs: Bilateral chest excursion, coarse throughout, No wheeze or crackles, Not assisting vent. Cardiovascular: S1, S2, RRR No RMG Abdomen: Soft, slightly distended, BS - Extremities: Cool to touch, mottled,  otherwise no obvious deformities Neuro: Sedated and intubated GU: No assessed  Resolved Hospital Problem list     Assessment & Plan:  Active GI bleed 2/2 Complicated aggressive progressive cholangiocarcinoma Massive bleed during ERCP 5/7 requiring multiple pressors and massive transfusion protocol Goals of Care discussion with wife who  desires DNR and  Comfort Care  Last Paralytic 13:30 in Endo Plan Wean and DC pressors No further blood products Wait at least another 1-2 hours for extubation  One way extubation once paralytic has worn off Start Morphine gtt for comfort  Call for any additional pain medications to optimize comfort     Best practice:  Diet: NPO Pain/Anxiety/Delirium protocol (if indicated): Morphine gtt VAP protocol (if indicated): NA DVT prophylaxis: PAS Hose GI  prophylaxis: Per GI Glucose control: NA Mobility: BR Code Status: DNR Family Communication: Wife updated at bedside Apr 02, 2020 Disposition: ICU for one way extubation  Labs   CBC: Recent Labs  Lab 04/01/2020 0358 04/05/2020 1400 04/15/2020 1708 04/02/2020 0034  WBC 8.0  --   --  12.5*  NEUTROABS 7.6  --   --   --   HGB 10.4* 9.2* 9.4* 6.3*  HCT 32.1* 27.9* 29.6*  27.0* 19.1*  MCV 107.4*  --   --  106.7*  PLT 160  --   --  120*    Basic Metabolic Panel: Recent Labs  Lab 04/08/2020 0358 2020/04/02 0034  NA 142 140  K 4.0 3.8  CL 111 112*  CO2 19* 18*  GLUCOSE 149* 213*  BUN 33* 47*  CREATININE 1.34* 1.57*  CALCIUM 8.0* 7.3*   GFR: Estimated Creatinine Clearance: 31 mL/min (A) (by C-G formula based on SCr of 1.57 mg/dL (H)). Recent Labs  Lab 04/09/2020 0358 Apr 02, 2020 0034  WBC 8.0 12.5*    Liver Function Tests: Recent Labs  Lab 04/04/2020 0358 Apr 02, 2020 0034  AST 978* 221*  ALT 624* 253*  ALKPHOS 275* 135*  BILITOT 2.7* 2.1*  PROT 5.5* 3.9*  ALBUMIN 2.8* 1.8*   Recent Labs  Lab 04/04/2020 0358  LIPASE 70*   Recent Labs  Lab 03/27/2020 0512  AMMONIA 18    ABG    Component Value Date/Time   PHART 7.332 (L) 07/15/2009 0103   PCO2ART 40.7 07/15/2009 0103   PO2ART 76.0 (L) 07/15/2009 0103   HCO3 21.6 07/15/2009 0103   TCO2 23 07/15/2009 1718   ACIDBASEDEF 4.0 (H) 07/15/2009 0103   O2SAT 94.0 07/15/2009 0103     Coagulation Profile: Recent Labs  Lab 04/11/2020 0358  INR 1.2    Cardiac Enzymes: No results for input(s): CKTOTAL, CKMB, CKMBINDEX, TROPONINI in the last 168 hours.  HbA1C: Hgb A1c MFr Bld  Date/Time Value Ref Range Status  03/02/2020 11:47 AM 6.0 4.6 - 6.5 % Final    Comment:    Glycemic Control Guidelines for People with Diabetes:Non Diabetic:  <6%Goal of Therapy: <7%Additional Action Suggested:  >8%   10/02/2019 10:26 AM 5.9 4.6 - 6.5 % Final    Comment:    Glycemic Control Guidelines for People with Diabetes:Non Diabetic:  <6%Goal of  Therapy: <7%Additional Action Suggested:  >8%     CBG: Recent Labs  Lab Apr 02, 2020 0018 02-Apr-2020 0724  GLUCAP 208* 203*    Review of Systems:   Unable, sedated and intubated  Past Medical History  He,  has a past medical history of BPH with obstruction/lower urinary tract symptoms (10/27/2015), Cataract, Cholangiocarcinoma ([place]) (2020), Colon wall thickening (04/2018), Coronary artery disease, Dementia ([place]), Diabetes mellitus without  complication ([place]), Diverticulosis of colon, Dysphagia (11/2018), Ectatic abdominal aorta ([place]) (01/2017), Erectile dysfunction due to arterial insufficiency, Fatigue, Gout, History of adenomatous polyp of colon (2002), History of stomach ulcers, Hyperlipidemia, Hypertension, Hypogonadism male, Hypothyroidism (09/2019), Klebsiella sepsis ([place]) (01/2017), Macrocytic anemia (01/2017), Myogenic ptosis of bilateral eyelids (2018), NASH (nonalcoholic steatohepatitis) (06/2017), Osteoarthritis of right shoulder (09/2018), Past use of tobacco, Rectal bleeding, Rosacea, S/P coronary artery bypass graft x 3, and Urine incontinence.   Surgical History    Past Surgical History:  Procedure Laterality Date  . BILIARY BRUSHING  06/26/2019   PATH: invasive adenocarcinoma.  Procedure: BILIARY BRUSHING;  Surgeon: Rush Landmark Telford Nab., MD;  Location: [place];  Service: Gastroenterology;;  . BILIARY DILATION  06/26/2019   Procedure: BILIARY DILATION;  Surgeon: Irving Copas., MD;  Location: [place];  Service: Gastroenterology;;  . BILIARY DILATION  11/18/2019   Procedure: BILIARY DILATION;  Surgeon: Irving Copas., MD;  Location: [facility];  Service: Gastroenterology;;  . BILIARY STENT PLACEMENT  06/26/2019   Procedure: BILIARY STENT PLACEMENT;  Surgeon: Irving Copas., MD;  Location: [place];  Service: Gastroenterology;;  . BILIARY STENT PLACEMENT N/A 11/18/2019   Procedure: BILIARY STENT PLACEMENT;  Surgeon: Irving Copas.,  MD;  Location: [facility];  Service: Gastroenterology;  Laterality: N/A;  . CARDIOVASCULAR STRESS TEST  08/28/2016   Low risk myoview, normal EF, no ischemia.  Marland Kitchen CATARACT EXTRACTION, BILATERAL    . COLONOSCOPY  2002, 2006, 02/25/2009; 06/11/18   Polyp x 1 2002, none 2006 or 2010.  Adenomatous polyp 06/11/18+  signif diverticulosis.  NO FURTHER COLONOSCOPIES DUE TO AGE.  Marland Kitchen CORONARY ARTERY BYPASS GRAFT  06/2009   descending,saphenous vein graft to first obtuse marginal, sequential saphenous vein graft to posterior descending and posterolateral  . Endoscopic vein harvest right thigh    . ERCP N/A 06/26/2019   Procedure: ENDOSCOPIC RETROGRADE CHOLANGIOPANCREATOGRAPHY (ERCP);  Surgeon: Irving Copas., MD;  Location: [place];  Service: Gastroenterology;  Laterality: N/A;  with stent   . ERCP N/A 11/18/2019   Procedure: ENDOSCOPIC RETROGRADE CHOLANGIOPANCREATOGRAPHY (ERCP);  Surgeon: Irving Copas., MD;  Location: [facility];  Service: Gastroenterology;  Laterality: N/A;  . IR RADIOLOGIST EVAL & MGMT  06/12/2018  . IR RADIOLOGIST EVAL & MGMT  09/18/2018  . IR RADIOLOGIST EVAL & MGMT  05/26/2019  . IR RADIOLOGIST EVAL & MGMT  07/29/2019  . PTCA    . REMOVAL OF STONES  06/26/2019   Procedure: REMOVAL OF STONES;  Surgeon: Rush Landmark Telford Nab., MD;  Location: [place];  Service: Gastroenterology;;  . REMOVAL OF STONES  11/18/2019   Procedure: REMOVAL OF STONES;  Surgeon: Irving Copas., MD;  Location: [facility];  Service: Gastroenterology;;  . Joan Mayans  06/26/2019   Procedure: Joan Mayans;  Surgeon: Irving Copas., MD;  Location: [place];  Service: Gastroenterology;;  . Lavell Islam REMOVAL  11/18/2019   Procedure: STENT REMOVAL;  Surgeon: Irving Copas., MD;  Location: [facility];  Service: Gastroenterology;;  . TONSILLECTOMY    . UMBILICAL HERNIA REPAIR     2009  . VASECTOMY       Social History   reports that he quit smoking about 37  years ago. His smoking use included cigarettes. He has a 30.00 pack-year smoking history. He has never used smokeless tobacco. He reports previous alcohol use of about 5.0 - 7.0 standard drinks of alcohol per week. He reports that he does not use drugs.   Family History   His family history  includes Cancer in his father and mother. There is no history of Colon cancer, Esophageal cancer, Stomach cancer, or Rectal cancer.   Allergies Allergies  Allergen Reactions  . Sulfonamide Derivatives Nausea Only     Home Medications  Prior to Admission medications   Medication Sig Start Date End Date Taking? Authorizing Provider  allopurinol (ZYLOPRIM) 100 MG tablet TAKE ONE TABLET BY MOUTH ON MONDAY, WEDNESDAY, AND FRIDAY Patient taking differently: Take 100 mg by mouth every Monday, Wednesday, and Friday. In the morning. 08/24/19  Yes McGowen, Adrian Blackwater, MD  aspirin 325 MG tablet Take 325 mg by mouth daily.     Yes [provider]  betamethasone dipropionate (DIPROLENE) 0.05 % cream Apply 1 application topically daily as needed (skin irritation.).  12/17/18  Yes [provider]  ferrous sulfate 325 (65 FE) MG EC tablet Take 325 mg by mouth daily with breakfast.   Yes [provider]  finasteride (PROSCAR) 5 MG tablet Take 1 tablet (5 mg total) by mouth daily. 02/26/20  Yes McGowen, Adrian Blackwater, MD  levothyroxine (SYNTHROID) 25 MCG tablet Take 1 tablet (25 mcg total) by mouth daily before breakfast. 11/27/19  Yes McGowen, Adrian Blackwater, MD  lisinopril (ZESTRIL) 5 MG tablet TAKE ONE TABLET BY MOUTH EVERY DAY Patient taking differently: Take 5 mg by mouth daily.  03/07/20  Yes McGowen, Adrian Blackwater, MD  metFORMIN (GLUCOPHAGE-XR) 500 MG 24 hr tablet TAKE ONE TABLET BY MOUTH TWICE DAILY WITH MEALS Patient taking differently: Take 500 mg by mouth 2 (two) times daily with a meal.  06/22/19  Yes McGowen, Adrian Blackwater, MD  Multiple Vitamin (MULTIVITAMIN WITH MINERALS) TABS tablet Take 1 tablet by mouth at  bedtime.   Yes [provider]  omeprazole (PRILOSEC) 40 MG capsule TAKE ONE CAPSULE BY MOUTH EVERY DAY Patient taking differently: Take 40 mg by mouth daily.  02/26/20  Yes McGowen, Adrian Blackwater, MD  rosuvastatin (CRESTOR) 10 MG tablet Take 1 tablet (10 mg total) by mouth daily. 02/26/20  Yes McGowen, Adrian Blackwater, MD  tamsulosin (FLOMAX) 0.4 MG CAPS capsule TAKE ONE CAPSULE BY MOUTH DAILY Patient taking differently: Take 0.4 mg by mouth daily.  03/14/20  Yes McGowen, Adrian Blackwater, MD  Trospium Chloride 60 MG CP24 TAKE ONE CAPSULE BY MOUTH EVERY DAY Patient taking differently: Take 60 mg by mouth daily.  12/17/19  Yes McGowen, Adrian Blackwater, MD  Lancets [facility] ULTRASOFT) lancets Use to check blood sugar once daily 04/15/18   McGowen, Adrian Blackwater, MD     Critical care time: 41 minutes    Magdalen Spatz, MSN, AGACNP-BC [place] Please refer to [facility] for pager and assignments After 4 pm please call [phone] Apr 18, 2020 3:57 PM

## 2020-04-19 NOTE — Progress Notes (Signed)
   03-27-20 0015  Assess: MEWS Score  Temp 97.6 F (36.4 C)  BP (!) 77/47  Pulse Rate (!) 101  ECG Heart Rate (!) 101  Resp (!) 26  Assess: MEWS Score  MEWS Temp 0  MEWS Systolic 2  MEWS Pulse 1  MEWS RR 2  MEWS LOC 1  MEWS Score 6  MEWS Score Color Red  Assess: if the MEWS score is Yellow or Red  Were vital signs taken at a resting state? Yes  Focused Assessment Documented focused assessment  Early Detection of Sepsis Score *See Row Information* Low  MEWS guidelines implemented *See Row Information* Yes  Treat  MEWS Interventions Other (Comment)  Take Vital Signs  Increase Vital Sign Frequency  Yellow: Q 2hr X 2 then Q 4hr X 2, if remains yellow, continue Q 4hrs  Escalate  MEWS: Escalate Yellow: discuss with charge nurse/RN and consider discussing with provider and RRT  Notify: Charge Nurse/RN  Name of Charge Nurse/RN Software engineer  Date Charge Nurse/RN Notified 03/27/2020  Time Charge Nurse/RN Notified 0010  Notify: Provider  Provider Name/Title Dr Kennon Holter  Date Provider Notified 2020/03/27  Time Provider Notified 0045  Notification Type Page  Notification Reason Change in status  Notify: Rapid Response  Name of Rapid Response RN Notified David RN  Date Rapid Response Notified 03-27-20  Time Rapid Response Notified 0017

## 2020-04-19 NOTE — Anesthesia Procedure Notes (Signed)
Procedure Name: Intubation Date/Time: 19-Apr-2020 11:43 AM Performed by: Inda Coke, CRNA Pre-anesthesia Checklist: Patient identified, Emergency Drugs available, Suction available and Patient being monitored Patient Re-evaluated:Patient Re-evaluated prior to induction Oxygen Delivery Method: Circle System Utilized Preoxygenation: Pre-oxygenation with 100% oxygen Induction Type: IV induction Ventilation: Mask ventilation without difficulty Laryngoscope Size: Mac and 4 Grade View: Grade I Tube type: Oral Tube size: 7.5 mm Number of attempts: 1 Airway Equipment and Method: Stylet and Oral airway Placement Confirmation: ETT inserted through vocal cords under direct vision,  positive ETCO2 and breath sounds checked- equal and bilateral Secured at: 22 cm Tube secured with: Tape Dental Injury: Teeth and Oropharynx as per pre-operative assessment

## 2020-04-19 NOTE — Progress Notes (Signed)
CDS referral initiated. Not a suitable candidate for donation. Referral # [id number]

## 2020-04-19 DEATH — deceased

## 2020-05-26 ENCOUNTER — Other Ambulatory Visit: Payer: Medicare Other

## 2020-05-26 ENCOUNTER — Other Ambulatory Visit (HOSPITAL_BASED_OUTPATIENT_CLINIC_OR_DEPARTMENT_OTHER): Payer: Medicare Other

## 2020-05-26 ENCOUNTER — Ambulatory Visit: Payer: Medicare Other | Admitting: Hematology & Oncology
